# Patient Record
Sex: Female | Born: 1965 | Race: Black or African American | Hispanic: No | Marital: Married | Smoking: Never smoker
Health system: Southern US, Community
[De-identification: ages and names within clinical notes are randomized; demographics above are authoritative.]

## PROBLEM LIST (undated history)

## (undated) DIAGNOSIS — R519 Headache, unspecified: Secondary | ICD-10-CM

## (undated) DIAGNOSIS — E785 Hyperlipidemia, unspecified: Secondary | ICD-10-CM

## (undated) DIAGNOSIS — I471 Supraventricular tachycardia, unspecified: Secondary | ICD-10-CM

## (undated) DIAGNOSIS — R51 Headache: Secondary | ICD-10-CM

## (undated) DIAGNOSIS — E119 Type 2 diabetes mellitus without complications: Secondary | ICD-10-CM

## (undated) DIAGNOSIS — I10 Essential (primary) hypertension: Secondary | ICD-10-CM

## (undated) DIAGNOSIS — D649 Anemia, unspecified: Secondary | ICD-10-CM

## (undated) DIAGNOSIS — K529 Noninfective gastroenteritis and colitis, unspecified: Secondary | ICD-10-CM

## (undated) DIAGNOSIS — K219 Gastro-esophageal reflux disease without esophagitis: Secondary | ICD-10-CM

## (undated) HISTORY — PX: TONSILLECTOMY: SUR1361

## (undated) HISTORY — DX: Essential (primary) hypertension: I10

## (undated) HISTORY — PX: ABDOMINAL HYSTERECTOMY: SHX81

## (undated) HISTORY — DX: Supraventricular tachycardia, unspecified: I47.10

## (undated) HISTORY — DX: Type 2 diabetes mellitus without complications: E11.9

## (undated) HISTORY — DX: Supraventricular tachycardia: I47.1

## (undated) HISTORY — DX: Headache: R51

## (undated) HISTORY — DX: Hyperlipidemia, unspecified: E78.5

## (undated) HISTORY — DX: Gastro-esophageal reflux disease without esophagitis: K21.9

## (undated) HISTORY — PX: TUBAL LIGATION: SHX77

## (undated) HISTORY — DX: Noninfective gastroenteritis and colitis, unspecified: K52.9

## (undated) HISTORY — DX: Headache, unspecified: R51.9

---

## 1999-10-25 ENCOUNTER — Ambulatory Visit (HOSPITAL_COMMUNITY): Admission: RE | Admit: 1999-10-25 | Discharge: 1999-10-25 | Payer: Self-pay | Admitting: Family Medicine

## 1999-10-25 ENCOUNTER — Encounter: Payer: Self-pay | Admitting: Family Medicine

## 2002-02-26 ENCOUNTER — Encounter: Payer: Self-pay | Admitting: Family Medicine

## 2002-02-26 ENCOUNTER — Ambulatory Visit (HOSPITAL_COMMUNITY): Admission: RE | Admit: 2002-02-26 | Discharge: 2002-02-26 | Payer: Self-pay | Admitting: Family Medicine

## 2002-03-26 ENCOUNTER — Encounter: Payer: Self-pay | Admitting: Family Medicine

## 2002-03-26 ENCOUNTER — Ambulatory Visit (HOSPITAL_COMMUNITY): Admission: RE | Admit: 2002-03-26 | Discharge: 2002-03-26 | Payer: Self-pay | Admitting: Family Medicine

## 2002-09-03 ENCOUNTER — Encounter: Payer: Self-pay | Admitting: Family Medicine

## 2002-09-03 ENCOUNTER — Ambulatory Visit (HOSPITAL_COMMUNITY): Admission: RE | Admit: 2002-09-03 | Discharge: 2002-09-03 | Payer: Self-pay | Admitting: Family Medicine

## 2002-11-14 ENCOUNTER — Ambulatory Visit (HOSPITAL_COMMUNITY): Admission: RE | Admit: 2002-11-14 | Discharge: 2002-11-14 | Payer: Self-pay | Admitting: Family Medicine

## 2002-11-14 ENCOUNTER — Encounter: Payer: Self-pay | Admitting: Family Medicine

## 2003-04-01 ENCOUNTER — Ambulatory Visit (HOSPITAL_COMMUNITY): Admission: RE | Admit: 2003-04-01 | Discharge: 2003-04-01 | Payer: Self-pay | Admitting: Internal Medicine

## 2005-08-23 ENCOUNTER — Ambulatory Visit (HOSPITAL_COMMUNITY): Admission: RE | Admit: 2005-08-23 | Discharge: 2005-08-23 | Payer: Self-pay | Admitting: Family Medicine

## 2006-01-05 ENCOUNTER — Emergency Department (HOSPITAL_COMMUNITY): Admission: EM | Admit: 2006-01-05 | Discharge: 2006-01-05 | Payer: Self-pay | Admitting: Emergency Medicine

## 2006-09-03 ENCOUNTER — Ambulatory Visit (HOSPITAL_COMMUNITY): Admission: RE | Admit: 2006-09-03 | Discharge: 2006-09-03 | Payer: Self-pay | Admitting: Family Medicine

## 2007-03-28 ENCOUNTER — Ambulatory Visit (HOSPITAL_COMMUNITY): Admission: RE | Admit: 2007-03-28 | Discharge: 2007-03-28 | Payer: Self-pay | Admitting: Family Medicine

## 2008-02-12 ENCOUNTER — Ambulatory Visit (HOSPITAL_COMMUNITY): Admission: RE | Admit: 2008-02-12 | Discharge: 2008-02-12 | Payer: Self-pay | Admitting: Family Medicine

## 2008-06-25 ENCOUNTER — Encounter (INDEPENDENT_AMBULATORY_CARE_PROVIDER_SITE_OTHER): Payer: Self-pay | Admitting: Obstetrics and Gynecology

## 2008-06-25 ENCOUNTER — Ambulatory Visit (HOSPITAL_COMMUNITY): Admission: RE | Admit: 2008-06-25 | Discharge: 2008-06-25 | Payer: Self-pay | Admitting: Obstetrics and Gynecology

## 2008-07-22 HISTORY — PX: HYSTEROSCOPY W/ ENDOMETRIAL ABLATION: SUR665

## 2008-09-17 ENCOUNTER — Ambulatory Visit (HOSPITAL_COMMUNITY): Admission: RE | Admit: 2008-09-17 | Discharge: 2008-09-17 | Payer: Self-pay | Admitting: Family Medicine

## 2008-09-23 ENCOUNTER — Other Ambulatory Visit: Admission: RE | Admit: 2008-09-23 | Discharge: 2008-09-23 | Payer: Self-pay | Admitting: Otolaryngology

## 2008-11-06 ENCOUNTER — Encounter (INDEPENDENT_AMBULATORY_CARE_PROVIDER_SITE_OTHER): Payer: Self-pay | Admitting: *Deleted

## 2008-12-10 DIAGNOSIS — D649 Anemia, unspecified: Secondary | ICD-10-CM | POA: Insufficient documentation

## 2008-12-10 DIAGNOSIS — I10 Essential (primary) hypertension: Secondary | ICD-10-CM | POA: Insufficient documentation

## 2008-12-10 DIAGNOSIS — R1310 Dysphagia, unspecified: Secondary | ICD-10-CM | POA: Insufficient documentation

## 2008-12-11 ENCOUNTER — Encounter (INDEPENDENT_AMBULATORY_CARE_PROVIDER_SITE_OTHER): Payer: Self-pay | Admitting: *Deleted

## 2008-12-17 ENCOUNTER — Other Ambulatory Visit: Admission: RE | Admit: 2008-12-17 | Discharge: 2008-12-17 | Payer: Self-pay | Admitting: Otolaryngology

## 2008-12-30 ENCOUNTER — Ambulatory Visit (HOSPITAL_COMMUNITY): Admission: RE | Admit: 2008-12-30 | Discharge: 2008-12-30 | Payer: Self-pay | Admitting: Otolaryngology

## 2009-02-24 ENCOUNTER — Ambulatory Visit (HOSPITAL_COMMUNITY): Admission: RE | Admit: 2009-02-24 | Discharge: 2009-02-24 | Payer: Self-pay | Admitting: Family Medicine

## 2009-09-28 ENCOUNTER — Ambulatory Visit (HOSPITAL_COMMUNITY): Admission: RE | Admit: 2009-09-28 | Discharge: 2009-09-28 | Payer: Self-pay | Admitting: Family Medicine

## 2010-01-19 ENCOUNTER — Emergency Department (HOSPITAL_COMMUNITY): Admission: EM | Admit: 2010-01-19 | Discharge: 2010-01-19 | Payer: Self-pay | Admitting: Family Medicine

## 2010-02-25 ENCOUNTER — Ambulatory Visit (HOSPITAL_COMMUNITY): Admission: RE | Admit: 2010-02-25 | Discharge: 2010-02-25 | Payer: Self-pay | Admitting: Family Medicine

## 2010-05-29 ENCOUNTER — Encounter: Payer: Self-pay | Admitting: Family Medicine

## 2010-06-09 NOTE — Letter (Signed)
Summary: Generic Letter, Intro to Referring  [facility]  [address]   [place], [place] [address]   Phone: [phone]  Fax: [phone]      November 06, 2008             RE: Amy Dean   [birth year]                 [address]                 [place], [place]  [address]  Dear Appt Secretary,  This pt has been scheduled an appt for 12/11/2008 @ 11:00 w/ Dr. Jena Gauss.   Lmom with appt info and for a return call.           Sincerely,    Elinor Parkinson  [facility] Ph: [phone]   Fax: [phone]

## 2010-06-09 NOTE — Letter (Signed)
Summary: Generic Letter, Intro to Referring  [facility]  [address]   [place], [place] [address]   Phone: [phone]  Fax: [phone]      December 11, 2008             RE: Amy Dean   03-04-1966                 [address]                 [place], [place]  [address]  Dear Appt Secretary,   This patient was a no show today.          Sincerely,    Elinor Parkinson  [facility] Ph: [phone]   Fax: [phone]

## 2010-07-21 LAB — POCT URINALYSIS DIPSTICK
Bilirubin Urine: NEGATIVE
Glucose, UA: NEGATIVE mg/dL
Ketones, ur: NEGATIVE mg/dL
Nitrite: NEGATIVE
Protein, ur: NEGATIVE mg/dL
Specific Gravity, Urine: 1.02 (ref 1.005–1.030)
Urobilinogen, UA: 0.2 mg/dL (ref 0.0–1.0)
pH: 5.5 (ref 5.0–8.0)

## 2010-08-23 LAB — BASIC METABOLIC PANEL
BUN: 7 mg/dL (ref 6–23)
CO2: 23 mEq/L (ref 19–32)
Calcium: 9.3 mg/dL (ref 8.4–10.5)
Chloride: 103 mEq/L (ref 96–112)
Creatinine, Ser: 0.64 mg/dL (ref 0.4–1.2)
GFR calc Af Amer: >60 mL/min (ref >60)
GFR calc non Af Amer: >60 mL/min (ref >60)
Glucose, Bld: 97 mg/dL (ref 70–99)
Potassium: 3.7 mEq/L (ref 3.5–5.1)
Sodium: 133 mEq/L — ABNORMAL LOW (ref 135–145)

## 2010-08-23 LAB — PREGNANCY, URINE: Preg Test, Ur: NEGATIVE

## 2010-08-23 LAB — CBC
HCT: 29.6 % — ABNORMAL LOW (ref 36.0–46.0)
Hemoglobin: 9.4 g/dL — ABNORMAL LOW (ref 12.0–15.0)
MCHC: 32 g/dL (ref 30.0–36.0)
MCV: 69.3 fL — ABNORMAL LOW (ref 78.0–100.0)
Platelets: 366 10*3/uL (ref 150–400)
RBC: 4.27 MIL/uL (ref 3.87–5.11)
RDW: 21 % — ABNORMAL HIGH (ref 11.5–15.5)
WBC: 3.6 10*3/uL — ABNORMAL LOW (ref 4.0–10.5)

## 2010-08-23 LAB — TYPE AND SCREEN
ABO/RH(D): O POS
Antibody Screen: NEGATIVE

## 2010-08-23 LAB — ABO/RH: ABO/RH(D): O POS

## 2010-08-23 LAB — PROTIME-INR
INR: 1 (ref 0.00–1.49)
Prothrombin Time: 13.4 seconds (ref 11.6–15.2)

## 2010-08-23 LAB — APTT: aPTT: 29 seconds (ref 24–37)

## 2010-09-20 NOTE — H&P (Signed)
NAMEJENNAE, HAKEEM         ACCOUNT NO.:  [id number]   MEDICAL RECORD NO.:  [id number]          PATIENT TYPE:  AMB   LOCATION:  SDC                           FACILITY:  [facility]   PHYSICIAN:  Kendra H. Tenny Craw, MD     DATE OF BIRTH:  1965-08-08   DATE OF ADMISSION:  06/25/2008  DATE OF DISCHARGE:                              HISTORY & PHYSICAL   CHIEF COMPLAINT:  Menorrhagia.   HISTORY OF PRESENT ILLNESS:  Ms. Poulter is a 45 year old gravida  2, para 2 African American female who presents for evaluation of  menorrhagia.  She has a 2-year history of heavy menstrual bleeding.  An  ultrasound performed by her primary care physician demonstrated an  enlarged uterus, measuring 19 x 7 x 13 cm with two large anterior  intramural fibroids, the largest measuring 7 x 6 x 6 cm.  She was also  noted to have a thickened endometrial lining with a 1.5 cm hypoechoic  focus within the endometrial cavity, possibly representing either a  submucosal fibroid versus an intrauterine polyp.  She presented to me  for further evaluation.  A sonohysterogram was performed, which  demonstrated a mass-like structure protruding into the endometrial  cavity, measuring 2.6 x 1.4 x 1.9 cm, consistent with either a  submucosal fibroid or an endometrial polyp.  At the time of evaluation,  the patient's hemoglobin was noted to be 8.7.  She was offered medical  versus surgical management, and the patient desired conservative  surgical management and presents today for hysteroscopy, D and C,  possible resection of endometrial polyp versus submucosal fibroid and  possible hydrotherm endometrial ablation.  Patient does understand that  given the size of the uterus, that the endometrial ablation may not be  successful, and she may require definitive surgical management with  hysterectomy.   PAST MEDICAL HISTORY:  1. Hypertension.  2. Anemia.   PAST SURGICAL HISTORY:  1. Full-term cesarean section for cephalopelvic  disproportion for an      infant weighing 7 pounds, 14 ounces.  2. In 1989, repeat cesarean section and bilateral tubal ligation.  3. Tonsillectomy and adenoidectomy.   MEDICATIONS:  1. Lisinopril/hydrochlorothiazide.  2. Iron sulfate.   FAMILY HISTORY:  Noncontributory.   SOCIAL HISTORY:  Negative x3.   PHYSICAL EXAMINATION:  Weight 197 pounds.  Blood pressure 130/70.  Height 5 feet 4 inches.  Alert and oriented x3.  No apparent distress.  ABDOMEN:  Soft and nontender.  Uterus is palpable at the umbilicus.  No hernias or hepatosplenomegaly  are noted.  Normal external female genitalia.  The vagina is rugated.  Uterus is enlarged approximately 20 weeks size and mobile.  The cervix  is nontender.  Adnexa nonpalpable.   ASSESSMENT/PLAN:  This is a 45 year old G2, P2 with anemia and  menorrhagia, who presents for hysteroscopy, dilatation and curettage,  endometrial polypectomy versus resection of submucosal fibroid and  possible hydrotherm endometrial ablation.      Freddrick March. Tenny Craw, MD  Electronically Signed     KHR/MEDQ  D:  06/24/2008  T:  06/25/2008  Job:  [id number]

## 2010-09-20 NOTE — Op Note (Signed)
Amy Dean, Amy Dean         ACCOUNT NO.:  [id number]   MEDICAL RECORD NO.:  [id number]          PATIENT TYPE:  AMB   LOCATION:  SDC                           [facility]   PHYSICIAN:  Kendra H. Tenny Craw, MD     DATE OF BIRTH:  03-19-1966   DATE OF PROCEDURE:  06/25/2008  DATE OF DISCHARGE:                               OPERATIVE REPORT   PREOPERATIVE DIAGNOSES:  1. Menorrhagia.  2. Anemia.  3. Uterine fibroids.  4. Endometrial mass.   POSTOPERATIVE DIAGNOSES:  1. Menorrhagia.  2. Anemia.  3. Fibroid uterus.  4. No evidence of endometrial mass.   PROCEDURES:  Hysteroscopy, dilation and curettage, HydroTherm  endometrial ablation.   SURGEON:  Freddrick March. Tenny Craw, MD   ASSISTANT:  None.   ANESTHESIA:  General.   OPERATIVE FINDINGS:  Enlarged and irregularly-shaped uterus  approximately 20 weeks' size, tortuous endometrial cavity enlarged in  size, sounding to 19 cm.  Normal-appearing endometrial surface.  No  evidence of endometrial masses or submucosal fibroids.  Intrauterine  blood clots were noted.   SPECIMEN:  Post-ablation endometrial curettings.   DISPOSITION OF SPECIMENS:  To Pathology.   ESTIMATED BLOOD LOSS:  Minimal.   COMPLICATIONS:  None.   PROCEDURE:  Ms. Robak is a 45 year old G2, P2 African American  female with a 2-year history of menorrhagia and anemia.  She was  diagnosed with uterine fibroids and a thickened endometrial lining.  She  underwent a Sonohystogram, which demonstrated a 2.9 x 1.9 x 1.4 cm  intracavitary mass.  In December 2009, she was placed on monthly  progestin therapy with Provera and presents today for hysteroscopy, D&C,  and endometrial ablative procedure.  Preoperative hemoglobin was 9.4,  which was improved from 8.7 from her previous sampling.  Following the  appropriate informed consent, the patient was brought to the operating  room where she was placed in the lithotomy position in [place] stirrups.  A general  anesthesia was administered.  She was prepped and draped in  the normal sterile fashion.  A speculum was placed into the vagina.  A  single-toothed tenaculum was placed on the anterior lip of the cervix.  A paracervical block was administered with 10 mL of 1% lidocaine.  The  uterus was then sounded to 19 cm.  The HydroTherm hysteroscopy scope was  then introduced transcervically.  The intrauterine cavity was noted to  be tortuous in shape with significant depth; however, the surface of the  endometrium itself was normal in appearance.  Given the tortuous nature  of the cavity, the tubal ostia bilaterally could not be adequately  visualized.  However, given ease of entry, it was felt that we were  within the endometrial cavity.  No submucosal fibroids or endometrial  polyps were noted.  Some blood clots were noted within the endometrial  cavity that were removed.  Given the depth of the cavity and the benign-  appearing surface of the endometrium, the decision was made to proceed  with the endometrial ablative procedure prior to endometrial sampling.  The HydroTherm device was initiated with a volume of 80 mL of fluid.  The cavity was then ablated, and under direct visualization, the  ablation was visualized.  At the completion of the cool down portion of  the ablative cycle, the hysteroscope was removed.  A sharp curettage was  then performed with removal of ablated endometrium and this will be sent  for pathology.  The patient tolerated the procedure well.  The single-  tooth tenaculum was removed from the anterior lip of the cervix.  The  speculum was removed from the vagina.  Anesthesia was reversed and the  patient was brought to the recovery room in stable condition following  the procedure.      Freddrick March. Tenny Craw, MD  Electronically Signed     KHR/MEDQ  D:  06/25/2008  T:  06/26/2008  Job:  [id number]

## 2010-09-20 NOTE — H&P (Signed)
NAMEEXA, BOMBA         ACCOUNT NO.:  [id number]   MEDICAL RECORD NO.:  [id number]          PATIENT TYPE:  AMB   LOCATION:  SDC                           [facility]   PHYSICIAN:  Kendra H. Tenny Craw, MD     DATE OF BIRTH:  08-Jun-1965   DATE OF ADMISSION:  DATE OF DISCHARGE:                              HISTORY & PHYSICAL   ATTENDING PHYSICIAN:  Kendra H. Tenny Craw, MD.   CHIEF COMPLAINT:  Menorrhagia.   HISTORY OF PRESENT ILLNESS:  Ms. Cropley is a 45 year old gravida  2, para 2, African American female who presents with a 2-year history of  heavy vaginal bleeding.  She was evaluated by her primary care physician  and noted to have an enlarged uterus.  A pelvic ultrasound was  performed, which demonstrated an enlarged uterus measuring approximately  19 x 7 x 13 cm with 2 anterior intramural fibroids noted, the first  measuring 7 x 6 x 6 cm, the second measuring 5 x 6 x 6 cm.  Additionally, the endometrium was noted to be thickened at this time  with a 1.5 cm hypoechoic focus representing either an endometrial polyp  versus a submucosal fibroid.  Her hemoglobin at this time was noted to  be 8.7.  She was sent to me for further evaluation.  She underwent a  sonohystogram on April 13, 2008, which demonstrated a benign cyst,  which demonstrated a mass-like structure emanating from the wall of the  endometrial canal.  Endometrial cavity measuring 2.6 x 1.4 x 1.9 cm,  which was consistent with either a intrauterine polyp versus a  submucosal fibroid.  The patient desired conservative management.  She  was placed on Provera 10 mg daily for 10 days out of the month and  scheduled for hysteroscopy D&C, possible resection of either endometrial  polyp or submucosal fibroid and possible hydrotherm endometrial  ablation.  Given the size of the uterus, the patient does understand  that an endometrial ablation may not be successful and she may require  definitive surgical management  with hysterectomy in the future.    DICTATION ENDED AT THIS POINT.      Freddrick March. Tenny Craw, MD  Electronically Signed     KHR/MEDQ  D:  06/24/2008  T:  06/25/2008  Job:  [id number]

## 2010-09-23 NOTE — Op Note (Signed)
NAMELEXXI, KOSLOW                     ACCOUNT NO.:  [id number]   MEDICAL RECORD NO.:  [id number]                   PATIENT TYPE:  AMB   LOCATION:  DAY                                  [facility]ACILITY:  [facility]   PHYSICIAN:  R. Roetta Sessions, M.D.              DATE OF BIRTH:  03/23/1966   DATE OF PROCEDURE:  04/01/2003  DATE OF DISCHARGE:                                 OPERATIVE REPORT   PROCEDURE:  Esophagogastroduodenoscopy with Elease Hashimoto dilation.   INDICATIONS FOR PROCEDURE:  The patient is a 45 year old lady with long  standing gastroesophageal reflux disease, also chronic esophageal dysphagia.  She comes for EGD with potential esophageal dilation as appropriate. This  approach has been discussed with the patient at length. The potential risks,  benefits, and alternatives have been reviewed. Reflux symptoms are well  controlled on the omeprazole 40 mg orally daily. This approach of EGD with  possible esophageal dilation was discussed with the patient at length. The  potential risks, benefits, and alternatives have been reviewed. Please see  my March 27, 2003 consultation for more information.   MONITORING:  O2 saturation, blood pressure, pulse, and respirations were  monitored throughout the entire procedure.   CONSCIOUS SEDATION:  Versed 4 mg IV, Demerol 100 mg IV in divided doses.   INSTRUMENTS:  Olympus video chip adult gastroscope.   FINDINGS:  Examination of the tubular esophagus revealed a noncritical  appearing Schatzki's ring. The esophageal mucosa appeared normal with no  evidence of Barrett's esophagus or esophagitis. The EG junction was easily  traversed.   STOMACH:  The gastric cavity was empty and insufflated well with air. A  thorough examination of the gastric mucosa including a retroflexed view of  the proximal stomach and esophagogastric junction demonstrated only a small  hiatal hernia. The pylorus was patent and easily traversed.   DUODENUM:  The bulb  and second portion appeared normal.   THERAPEUTIC/DIAGNOSTIC MANEUVERS:  A 56 French Maloney dilator was passed to  full insertion with good patient tolerance and without blood return on the  dilator. A look back revealed no  apparent complications related to passage  of the dilator. The patient tolerated the procedure well and was reacted in  endoscopy.   IMPRESSION:  1. Noncritical appearing Schatzki's ring otherwise normal tubular esophagus     status post dilation as described above.  2. Small hiatal hernia otherwise normal stomach, normal D1 and D2.   RECOMMENDATIONS:  1. Continue omeprazole 40 mg orally daily, antireflux measures.  2. Followup appointment within one month to make sure she is doing well. If     she were to have continued symptoms of dysphagia, she would need further     evaluation via barium pill esophagram.      ___________________________________________  Jonathon Bellows, M.D.   RMR/MEDQ  D:  04/01/2003  T:  04/01/2003  Job:  [id number]   cc:   Dr. Gerda Diss

## 2010-09-23 NOTE — Consult Note (Signed)
[name], [name]                     ACCOUNT NO.:  [id number]   MEDICAL RECORD NO.:  [id number]                   PATIENT TYPE:  OUT   LOCATION:  RAD                                  FACILITY:  [facility]   PHYSICIAN:  Lionel Dean, M.D.                 DATE OF BIRTH:  1966-01-20   DATE OF CONSULTATION:  03/27/2003  DATE OF DISCHARGE:                                   CONSULTATION   REASON FOR CONSULTATION:  Dysphagia.   HISTORY OF PRESENT ILLNESS:  Amy Dean is a 45 year old black female who  presents today at the request of Dr. Lilyan Punt for further evaluation of  dysphagia.  We have seen Jasalyn in the past for dysphagia and reflux  symptoms in August 2001.  She was scheduled for endoscopy at that time but  had to cancer.  She presents today stating that she had done fairly well up  until about a month or two ago when she started noticing worsening dysphagia  symptoms.  When she swallows solid foods and sometimes her medications, it  gets lodged in her mid esophageal region.  Sometimes she is able to wash it  down with liquids, but other times, the food comes back up.  She has chronic  heartburn which is well controlled on omeprazole.  She has been on this for  1-2 years.  Denies any abdominal pain, constipation, diarrhea, melena or  rectal bleeding.   CURRENT MEDICATIONS:  1. Diovan 160 mg daily.  2. Omeprazole 40 mg daily.   ALLERGIES:  No known drug allergies.   PAST MEDICAL HISTORY:  1. Hypertension.  2. Status post cesarean section times two.   FAMILY HISTORY:  Negative or chronic GI illnesses or colorectal cancer.   SOCIAL HISTORY:  She has been married for 14 years.  She had two sons, but  one was killed in a motor vehicle accident a couple of years ago at age 74.  She is employed at [employer] in [place].  She  has never been a smoker.  She consumes alcohol, some over the holidays.   PHYSICAL EXAMINATION:  VITAL SIGNS:  Weight  206, height 5 feet 4 inches,  temperature 97.9, blood pressure 140/92, pulse 70.  GENERAL APPEARANCE:  Pleasant, well-developed, well-nourished black female  in no acute distress.  SKIN:  Warm and dry, no jaundice.  HEENT:  Conjunctivae are pink.  Sclerae nonicteric.  Oropharyngeal  moist  and pink.  No lesions, erythema or exudate.  No lymphadenopathy or  thyromegaly.  CHEST:  Clear to auscultation.  CARDIAC:  Regular rate and rhythm.  Normal S1, S2.  No murmurs, rubs or  gallops.  ABDOMEN:  Positive bowel sounds, slightly obese but symmetrical, soft,  nontender.  No organomegaly or masses.  EXTREMITIES:  No edema.   IMPRESSION:  Amy Dean is a pleasant 45 year old lady with progressively  worsening esophageal dysphagia.  She may have developed esophageal  webbing  or stricture.  She also has chronic gastroesophageal reflux disease with  symptoms well controlled on omeprazole.   PLAN:  1. EGD with dilatation in the near future.  2. Continue omeprazole 40 mg daily.   I would like to thank Dr. Lilyan Punt for allowing [place] to take part in the  care of this patient.   As the patient has established care with Dr. Jena Gauss, he will be performing  upper endoscopy.     ________________________________________  ___________________________________________  Tana Coast, P.A.                         Lionel Dean, M.D.   LL/MEDQ  D:  03/27/2003  T:  03/27/2003  Job:  [id number]   cc:   R. Roetta Sessions, M.D.  [address]  [place]  [place] [address]  Fax: [phone]

## 2011-02-08 ENCOUNTER — Other Ambulatory Visit: Payer: Self-pay | Admitting: Family Medicine

## 2011-02-08 DIAGNOSIS — Z139 Encounter for screening, unspecified: Secondary | ICD-10-CM

## 2011-02-27 ENCOUNTER — Ambulatory Visit (HOSPITAL_COMMUNITY): Payer: 59

## 2011-02-28 ENCOUNTER — Ambulatory Visit (HOSPITAL_COMMUNITY)
Admission: RE | Admit: 2011-02-28 | Discharge: 2011-02-28 | Disposition: A | Discharge status: Home / Self Care | Payer: 59 | Source: Ambulatory Visit | Attending: Family Medicine | Admitting: Family Medicine

## 2011-02-28 DIAGNOSIS — Z139 Encounter for screening, unspecified: Secondary | ICD-10-CM

## 2011-02-28 DIAGNOSIS — Z1231 Encounter for screening mammogram for malignant neoplasm of breast: Secondary | ICD-10-CM | POA: Insufficient documentation

## 2011-03-06 ENCOUNTER — Emergency Department (HOSPITAL_COMMUNITY): Payer: 59

## 2011-03-06 ENCOUNTER — Encounter: Payer: Self-pay | Admitting: *Deleted

## 2011-03-06 ENCOUNTER — Emergency Department (HOSPITAL_COMMUNITY)
Admission: EM | Admit: 2011-03-06 | Discharge: 2011-03-06 | Disposition: A | Discharge status: Home / Self Care | Payer: 59 | Attending: Emergency Medicine | Admitting: Emergency Medicine

## 2011-03-06 DIAGNOSIS — R109 Unspecified abdominal pain: Secondary | ICD-10-CM | POA: Insufficient documentation

## 2011-03-06 DIAGNOSIS — R102 Pelvic and perineal pain: Secondary | ICD-10-CM

## 2011-03-06 DIAGNOSIS — E119 Type 2 diabetes mellitus without complications: Secondary | ICD-10-CM | POA: Insufficient documentation

## 2011-03-06 DIAGNOSIS — I10 Essential (primary) hypertension: Secondary | ICD-10-CM | POA: Insufficient documentation

## 2011-03-06 DIAGNOSIS — N949 Unspecified condition associated with female genital organs and menstrual cycle: Secondary | ICD-10-CM | POA: Insufficient documentation

## 2011-03-06 DIAGNOSIS — D259 Leiomyoma of uterus, unspecified: Secondary | ICD-10-CM | POA: Insufficient documentation

## 2011-03-06 LAB — URINALYSIS, ROUTINE W REFLEX MICROSCOPIC
Bilirubin Urine: NEGATIVE
Glucose, UA: NEGATIVE mg/dL
Hgb urine dipstick: NEGATIVE
Ketones, ur: NEGATIVE mg/dL
Leukocytes, UA: NEGATIVE
Nitrite: NEGATIVE
Protein, ur: NEGATIVE mg/dL
Specific Gravity, Urine: 1.02 (ref 1.005–1.030)
Urobilinogen, UA: 0.2 mg/dL (ref 0.0–1.0)
pH: 6 (ref 5.0–8.0)

## 2011-03-06 LAB — BASIC METABOLIC PANEL
BUN: 12 mg/dL (ref 6–23)
CO2: 23 mEq/L (ref 19–32)
Calcium: 9.3 mg/dL (ref 8.4–10.5)
Chloride: 102 mEq/L (ref 96–112)
Creatinine, Ser: 0.63 mg/dL (ref 0.50–1.10)
GFR calc Af Amer: >90 mL/min (ref >90)
GFR calc non Af Amer: >90 mL/min (ref >90)
Glucose, Bld: 147 mg/dL — ABNORMAL HIGH (ref 70–99)
Potassium: 3.6 mEq/L (ref 3.5–5.1)
Sodium: 135 mEq/L (ref 135–145)

## 2011-03-06 LAB — CBC
HCT: 32.7 % — ABNORMAL LOW (ref 36.0–46.0)
Hemoglobin: 10.8 g/dL — ABNORMAL LOW (ref 12.0–15.0)
MCH: 25.3 pg — ABNORMAL LOW (ref 26.0–34.0)
MCHC: 33 g/dL (ref 30.0–36.0)
MCV: 76.6 fL — ABNORMAL LOW (ref 78.0–100.0)
Platelets: 216 10*3/uL (ref 150–400)
RBC: 4.27 MIL/uL (ref 3.87–5.11)
RDW: 14.9 % (ref 11.5–15.5)
WBC: 3.7 10*3/uL — ABNORMAL LOW (ref 4.0–10.5)

## 2011-03-06 LAB — DIFFERENTIAL
Basophils Absolute: 0 10*3/uL (ref 0.0–0.1)
Basophils Relative: 1 % (ref 0–1)
Eosinophils Absolute: 0.1 10*3/uL (ref 0.0–0.7)
Eosinophils Relative: 3 % (ref 0–5)
Lymphocytes Relative: 34 % (ref 12–46)
Lymphs Abs: 1.3 10*3/uL (ref 0.7–4.0)
Monocytes Absolute: 0.4 10*3/uL (ref 0.1–1.0)
Monocytes Relative: 11 % (ref 3–12)
Neutro Abs: 1.9 10*3/uL (ref 1.7–7.7)
Neutrophils Relative %: 52 % (ref 43–77)

## 2011-03-06 LAB — PREGNANCY, URINE: Preg Test, Ur: NEGATIVE

## 2011-03-06 MED ORDER — HYDROCODONE-ACETAMINOPHEN 5-500 MG PO TABS: 1.0000 | ORAL_TABLET | Freq: Four times a day (QID) | ORAL | Status: AC | PRN

## 2011-03-06 NOTE — ED Notes (Signed)
Pt reports left lower abdominal pain, radiating into back.  Reports she's had pain since Friday, but pain has increased in severity tonight.

## 2011-03-06 NOTE — ED Provider Notes (Signed)
History     CSN: [id number] Arrival date & time: 03/06/2011  4:21 AM   First MD Initiated Contact with Patient 03/06/11 0423      Chief Complaint  Patient presents with  . Abdominal Pain    (Consider location/radiation/quality/duration/timing/severity/associated sxs/prior treatment) Patient is a 45 y.o. female presenting with abdominal pain. The history is provided by the patient.  Abdominal Pain The primary symptoms of the illness include abdominal pain. The primary symptoms of the illness do not include fever, nausea, vomiting, diarrhea, dysuria or vaginal bleeding. The current episode started 2 days ago. The onset of the illness was gradual. The problem has been gradually worsening.  The patient states that she believes she is currently not pregnant. The patient has not had a change in bowel habit. Symptoms associated with the illness do not include chills, diaphoresis, constipation, urgency, hematuria, frequency or back pain.    Past Medical History  Diagnosis Date  . Hypertension   . Diabetes mellitus     History reviewed. No pertinent past surgical history.  History reviewed. No pertinent family history.  History  Substance Use Topics  . Smoking status: Never Smoker   . Smokeless tobacco: Not on file  . Alcohol Use: No    OB History    Grav Para Term Preterm Abortions TAB SAB Ect Mult Living                  Review of Systems  Constitutional: Negative for fever, chills and diaphoresis.  Gastrointestinal: Positive for abdominal pain. Negative for nausea, vomiting, diarrhea and constipation.  Genitourinary: Negative for dysuria, urgency, frequency, hematuria and vaginal bleeding.  Musculoskeletal: Negative for back pain.  All other systems reviewed and are negative.    Allergies  Review of patient's allergies indicates no known allergies.  Home Medications   Current Outpatient Rx  Name Route Sig Dispense Refill  . LISINOPRIL-HYDROCHLOROTHIAZIDE 20-12.5  MG PO TABS Oral Take 1 tablet by mouth daily.      Marland Kitchen METFORMIN HCL 500 MG PO TABS Oral Take 500 mg by mouth 2 (two) times daily with a meal.        BP 157/93  Pulse 100  Temp(Src) 98.5 F (36.9 C) (Oral)  Resp 18  Ht 5\' 4"  (1.626 m)  Wt 196 lb (88.905 kg)  BMI 33.64 kg/m2  SpO2 99%  LMP 02/10/2011  Physical Exam  Nursing note and vitals reviewed. Constitutional: She is oriented to person, place, and time. She appears well-developed and well-nourished. No distress.  HENT:  Head: Normocephalic and atraumatic.  Neck: Normal range of motion. Neck supple.  Cardiovascular: Normal rate and regular rhythm.  Exam reveals no gallop and no friction rub.   No murmur heard. Pulmonary/Chest: Effort normal and breath sounds normal. No respiratory distress.  Abdominal: Soft. She exhibits no distension.       There is moderate ttp in the llq.  There is no guarding or rebound.    Musculoskeletal: Normal range of motion.  Neurological: She is alert and oriented to person, place, and time.  Skin: Skin is warm and dry. She is not diaphoretic.    ED Course  Procedures (including critical care time)   Labs Reviewed  CBC  DIFFERENTIAL  BASIC METABOLIC PANEL  URINALYSIS, ROUTINE W REFLEX MICROSCOPIC  PREGNANCY, URINE   No results found.   No diagnosis found.    MDM  Nothing acute on labs, CT.  CT does show enlarged uterus with fibroids.  Will treat with  pain meds, arrange follow up with Gyn.          Geoffery Lyons, MD 03/06/11 [phone]

## 2011-03-10 ENCOUNTER — Encounter (HOSPITAL_COMMUNITY): Payer: Self-pay | Admitting: Pharmacy Technician

## 2011-03-15 ENCOUNTER — Other Ambulatory Visit: Payer: Self-pay

## 2011-03-15 ENCOUNTER — Other Ambulatory Visit: Payer: Self-pay | Admitting: Obstetrics and Gynecology

## 2011-03-15 ENCOUNTER — Encounter (HOSPITAL_COMMUNITY)
Admission: RE | Admit: 2011-03-15 | Discharge: 2011-03-15 | Disposition: A | Discharge status: Home / Self Care | Payer: 59 | Source: Ambulatory Visit | Attending: Obstetrics and Gynecology | Admitting: Obstetrics and Gynecology

## 2011-03-15 ENCOUNTER — Encounter (HOSPITAL_COMMUNITY): Payer: Self-pay | Admitting: Obstetrics and Gynecology

## 2011-03-15 ENCOUNTER — Encounter (HOSPITAL_COMMUNITY): Payer: Self-pay

## 2011-03-15 LAB — URINALYSIS, ROUTINE W REFLEX MICROSCOPIC
Bilirubin Urine: NEGATIVE
Glucose, UA: NEGATIVE mg/dL
Ketones, ur: NEGATIVE mg/dL
Leukocytes, UA: NEGATIVE
Nitrite: NEGATIVE
Protein, ur: NEGATIVE mg/dL
Specific Gravity, Urine: 1.02 (ref 1.005–1.030)
Urobilinogen, UA: 0.2 mg/dL (ref 0.0–1.0)
pH: 5.5 (ref 5.0–8.0)

## 2011-03-15 LAB — COMPREHENSIVE METABOLIC PANEL
ALT: 16 U/L (ref 0–35)
AST: 20 U/L (ref 0–37)
Albumin: 4 g/dL (ref 3.5–5.2)
Alkaline Phosphatase: 68 U/L (ref 39–117)
BUN: 8 mg/dL (ref 6–23)
CO2: 26 mEq/L (ref 19–32)
Calcium: 10 mg/dL (ref 8.4–10.5)
Chloride: 100 mEq/L (ref 96–112)
Creatinine, Ser: 0.62 mg/dL (ref 0.50–1.10)
GFR calc Af Amer: >90 mL/min (ref >90)
GFR calc non Af Amer: >90 mL/min (ref >90)
Glucose, Bld: 147 mg/dL — ABNORMAL HIGH (ref 70–99)
Potassium: 3.7 mEq/L (ref 3.5–5.1)
Sodium: 135 mEq/L (ref 135–145)
Total Bilirubin: 0.2 mg/dL — ABNORMAL LOW (ref 0.3–1.2)
Total Protein: 7.7 g/dL (ref 6.0–8.3)

## 2011-03-15 LAB — SURGICAL PCR SCREEN
MRSA, PCR: NEGATIVE
Staphylococcus aureus: NEGATIVE

## 2011-03-15 LAB — URINE MICROSCOPIC-ADD ON

## 2011-03-15 LAB — CBC
HCT: 34.3 % — ABNORMAL LOW (ref 36.0–46.0)
Hemoglobin: 11.1 g/dL — ABNORMAL LOW (ref 12.0–15.0)
MCH: 24.8 pg — ABNORMAL LOW (ref 26.0–34.0)
MCHC: 32.4 g/dL (ref 30.0–36.0)
MCV: 76.6 fL — ABNORMAL LOW (ref 78.0–100.0)
Platelets: 308 10*3/uL (ref 150–400)
RBC: 4.48 MIL/uL (ref 3.87–5.11)
RDW: 14.7 % (ref 11.5–15.5)
WBC: 3.9 10*3/uL — ABNORMAL LOW (ref 4.0–10.5)

## 2011-03-15 LAB — HCG, SERUM, QUALITATIVE: Preg, Serum: NEGATIVE

## 2011-03-15 MED ORDER — PEG 3350-KCL-NABCB-NACL-NASULF 236 G PO SOLR: 2000.0000 mL | Freq: Once | ORAL | Status: DC

## 2011-03-15 NOTE — Patient Instructions (Addendum)
Hysterectomy A hysterectomy is a procedure where your womb (uterus) is surgically taken out. It will no longer be possible to have menstrual periods or to become pregnant. Removal of the tubes and ovaries (bilateral salpingo-oopherectomy) can be done during this operation as well.   An abdominal hysterectomy is done through a large cut (incision) in the abdomen made by the surgeon.   A vaginal hysterectomy is done through the vagina. There are no abdominal incisions, but there will be incisions on the inside of the vagina.   A laparoscopic assisted vaginal hysterectomy is done through 2 or 3 small incisions in the abdomen, but the uterus is removed and passed through the vagina.  Women who are going to have a hysterectomy should be tested first to make sure there is no cancer of the cervix or in the uterus. INDICATIONS FOR HYSTERECTOMY:  Persistent abnormal bleeding.   Lasting (chronic) pelvic pain.   Endometriosis. This is when the lining of the uterus (endometrium) is misplaced outside of its normal location.   Adenomyosis. This is when the endometrium tissue grows in the muscle of the uterus.   Uterine prolapse. This is when the uterus falls down into the vagina.   Cancer of the uterus or cervix that requires a radical hysterectomy, removal of the uterus, tubes, ovaries, and surrounding lymph nodes.  LET YOUR CAREGIVER KNOW ABOUT:  Allergies (especially to medicines).   Medications taken including herbs, eye drops, over the counter medications, and creams.   Use of steroids (by mouth or creams).   Past problems with anesthetics or numbing medication.   Possibility of pregnancy, if this applies.   History of blood clots (thrombophlebitis).   History of bleeding or blood problems.   Past surgery.   Other health problems.  RISKS AND COMPLICATIONS All surgeries can have risks. Some of these risks are:  A lot of bleeding.   Injury to surrounding organs.   Infection.    Blood clots of the leg, heart, or lung.   Problems with anesthesia.   Early menopause.  BEFORE THE PROCEDURE  Do not take aspirin or blood thinners for a week before surgery, or as directed by your caregiver.   Do not eat or drink anything after midnight the night before surgery, or as directed by your caregiver.   Let your caregiver know if you get a cold or other infectious problems before surgery.   If you are being admitted the day of surgery, you should be present 60 minutes before your procedure or as told by your caregiver.  PROCEDURE   An IV (intravenous) will be placed in your arm. You will be given a drug to make you sleep (anesthetic) during surgery. You may be given a shot in the spine (spinal anesthesia) that will numb your body from the waist down. This will keep you pain-free during surgery.   When you wake from surgery, you will have the IV and a long, narrow, hollow tube (urinary catheter) draining the bladder for 1 or 2 days after surgery. This will make passing your urine easier. It also helps by keeping your bladder empty during surgery.   After surgery, you will be taken to the recovery area where a nurse will watch and check your progress. Once you wake up, stable and taking fluids well, without other problems, you will be allowed to return to your room. Usually you will remain in the hospital 3 to 5 days. You may be given an antibiotic during and after   the surgery and when you go home. Pain medication will be ordered by your caregiver while you are in the hospital and when you go home.  HOME CARE INSTRUCTIONS  Healing will take time. You will have discomfort, tenderness, swelling, and bruising at the operative site for a couple of weeks. This is normal and will get better as time goes on. 20 Amy Dean  03/15/2011   Your procedure is scheduled on:  03/21/2011  Report to [facility] at  615  AM.  Call this number if you have problems the morning of  surgery: [phone]   Remember:   Do not eat food:After Midnight.  Do not drink clear liquids: After Midnight.  Take these medicines the morning of surgery with A SIP OF WATER:  vicodin,lisinopril   Do not wear jewelry, make-up or nail polish.  Do not wear lotions, powders, or perfumes. You may wear deodorant.  Do not shave 48 hours prior to surgery.  Do not bring valuables to the hospital.  Contacts, dentures or bridgework may not be worn into surgery.  Leave suitcase in the car. After surgery it may be brought to your room.  For patients admitted to the hospital, checkout time is 11:00 AM the day of discharge.   Patients discharged the day of surgery will not be allowed to drive home.  Name and phone number of your driver: family  Special Instructions: CHG Shower Use Special Wash: 1/2 bottle night before surgery and 1/2 bottle morning of surgery.   Please read over the following fact sheets that you were given: Pain Booklet, MRSA Information, Surgical Site Infection Prevention, Anesthesia Post-op Instructions and Care and Recovery After Surgery   Only take over-the-counter or prescription medicines for pain, discomfort, or fever as directed by your caregiver.   Do not take aspirin. It can cause bleeding.   Do not drive when taking pain medication.   Follow your caregiver's advice regarding diet, exercise, lifting, driving, and general activities.   Resume your usual diet as directed and allowed.   Get plenty of rest and sleep.   Do not douche, use tampons, or have sexual intercourse until your caregiver gives you permission.   Change your bandages (dressings) as directed.   Take your temperature twice a day. Write it down.   Your caregiver may recommend showers instead of baths for a few weeks.   Do not drink alcohol until your caregiver gives you permission.   If you develop constipation, you may take a mild laxative with your caregiver's permission. Bran foods and  drinking fluids helps with constipation problems.   Try to have someone home with you for a week or two to help with the household activities.   Make sure you and your family understands everything about your operation and recovery.   Do not sign any legal documents until you feel normal again.   Keep all your follow-up appointments as recommended by your caregiver.  SEEK MEDICAL CARE IF:   There is swelling, redness, or increasing pain in the wound area.   Pus is coming from the wound.   You notice a bad smell from the wound or surgical dressing.   You have pain, redness, and swelling from the intravenous site.   The wound is breaking open (the edges are not staying together).   You feel dizzy or feel like fainting.   You develop pain or bleeding when you urinate.   You develop diarrhea.   You develop nausea and  vomiting.   You develop abnormal vaginal discharge.   You develop a rash.   You have any type of abnormal reaction or develop an allergy to your medication.   You need stronger pain medication for your pain.  SEEK IMMEDIATE MEDICAL CARE IF:  You have a fever.   You develop abdominal pain.   You develop chest pain.   You develop shortness of breath.   You pass out.   You develop pain, swelling, or redness of your leg.   You develop heavy vaginal bleeding with or without blood clots.  Document Released: 10/18/2000 Document Revised: 01/04/2011 Document Reviewed: 09/05/2007 [facility] Patient Information 2012 [place], [place].PATIENT INSTRUCTIONS POST-ANESTHESIA  IMMEDIATELY FOLLOWING SURGERY:  Do not drive or operate machinery for the first twenty four hours after surgery.  Do not make any important decisions for twenty four hours after surgery or while taking narcotic pain medications or sedatives.  If you develop intractable nausea and vomiting or a severe headache please notify your doctor immediately.  FOLLOW-UP:  Please make an appointment with your  surgeon as instructed. You do not need to follow up with anesthesia unless specifically instructed to do so.  WOUND CARE INSTRUCTIONS (if applicable):  Keep a dry clean dressing on the anesthesia/puncture wound site if there is drainage.  Once the wound has quit draining you may leave it open to air.  Generally you should leave the bandage intact for twenty four hours unless there is drainage.  If the epidural site drains for more than 36-48 hours please call the anesthesia department.  QUESTIONS?:  Please feel free to call your physician or the hospital operator if you have any questions, and they will be happy to assist you.     [facility] Anesthesia Department [address] [place] [phone]

## 2011-03-20 NOTE — H&P (Addendum)
Amy Dean is an 45 y.o. female. She is being admitted for abdominal hysterectomy for symptomatic uterine fibroids, after an attempted endometrial ablation done 2010 was unsuccessful at controlling menses. Endometrial pathology then was benign secretory endometrium.   Pertinent Gynecological History: Menses: regular every month without intermenstrual spotting and usually lasting 7 to 8 days Bleeding:  Contraception: tubal ligation DES exposure: unknown Blood transfusions: none Sexually transmitted diseases: no past history Previous GYN Procedures: endometrial ablation 06/23/2008  Last mammogram: normal Date: last year Last pap: normal Date: 2011 OB History: G3, P2-0-1-2   Menstrual History: Menarche age:  Patient's last menstrual period was 02/10/2011.    Past Medical History  Diagnosis Date  . Hypertension   . Diabetes mellitus     Past Surgical History  Procedure Date  . Cesarean section 1982 and 1989    x2  . Tonsillectomy   . Hysteroscopy w/ endometrial ablation 07/22/2008    K.Tenny Craw MD [facility]  . Tubal ligation     Family History  Problem Relation Age of Onset  . Anesthesia problems Neg Hx   . Hypotension Neg Hx   . Malignant hyperthermia Neg Hx   . Pseudochol deficiency Neg Hx     Social History:  reports that she has never smoked. She has never used smokeless tobacco. She reports that she drinks alcohol. She reports that she does not use illicit drugs.  Allergies: No Known Allergies  No prescriptions prior to admission    ROS she notes a light discharge. She has mild stress incontinence with laughing or coughing. She has urge incontinence symptoms x6 months. Menses are particularly of the first 3 days of each cycle Last menstrual period 02/10/2011. Physical Exam general exam shows a healthy African American female alert oriented x3 weight  200.8 pounds, blood pressure 134/80 pulse 70s HEENT within normal limits neck supple mouth good oral care chest  clear to auscultation cardiac exam regular rate rhythm Abdomen shows a large mobile mass in the lower abdomen reaching to the umbilicus under a midline vertical scar the mass is firm and hard irregular she has had ultrasound which shows it to be a fibroid uterus. Normal external genitalia the vaginal length clear midcycle cervical mucus large mobile fibroid uterus filling the pelvis extending to the umbilicus 20 weeks size Extremities without cyanosis clubbing or edema bowel prep has been performed Hemoglobin & Hematocrit     Component Value Date/Time   HGB 11.1* 03/15/2011 1530   HCT 34.3* 03/15/2011 1530    No results found for this or any previous visit (from the past 24 hour(s)).  No results found. 03/20/2011 Assessment/Plan: Uterine fibroids 20 weeks size, symptomatic, status post failed effort at endometrial ablation Diabetes mellitus Hypertension Mild anemia Plan laparotomy with abdominal hysterectomy with removal of cervix. The plans are to to preserve the tubes and ovaries acknowledges that surgical findings may warrant removal of one or both tubes and ovaries  Risk of procedure including injury to adjacent organs has been reviewed with the patient in details. Potential for antibiotics, or transfusions reviewed with patient the patient has undergone bowel prep.   Mccade Sullenberger V 03/20/2011, 5:35 PM  Labs reviewed, patient interviewed, no changes in patient condition or surgical plan.  CBG 110 this am.

## 2011-03-21 ENCOUNTER — Encounter (HOSPITAL_COMMUNITY): Payer: Self-pay | Admitting: Anesthesiology

## 2011-03-21 ENCOUNTER — Inpatient Hospital Stay (HOSPITAL_COMMUNITY)
Admission: RE | Admit: 2011-03-21 | Discharge: 2011-03-23 | DRG: 743 | Disposition: A | Discharge status: Home / Self Care | Payer: 59 | Source: Ambulatory Visit | Attending: Obstetrics and Gynecology | Admitting: Obstetrics and Gynecology

## 2011-03-21 ENCOUNTER — Inpatient Hospital Stay (HOSPITAL_COMMUNITY): Payer: 59 | Admitting: Anesthesiology

## 2011-03-21 ENCOUNTER — Encounter (HOSPITAL_COMMUNITY)
Admission: RE | Disposition: A | Discharge status: Home / Self Care | Payer: Self-pay | Source: Ambulatory Visit | Attending: Obstetrics and Gynecology

## 2011-03-21 ENCOUNTER — Other Ambulatory Visit: Payer: Self-pay | Admitting: Obstetrics and Gynecology

## 2011-03-21 ENCOUNTER — Encounter (HOSPITAL_COMMUNITY): Payer: Self-pay | Admitting: *Deleted

## 2011-03-21 DIAGNOSIS — D252 Subserosal leiomyoma of uterus: Secondary | ICD-10-CM | POA: Diagnosis present

## 2011-03-21 DIAGNOSIS — I10 Essential (primary) hypertension: Secondary | ICD-10-CM | POA: Diagnosis present

## 2011-03-21 DIAGNOSIS — E119 Type 2 diabetes mellitus without complications: Secondary | ICD-10-CM | POA: Diagnosis present

## 2011-03-21 DIAGNOSIS — D250 Submucous leiomyoma of uterus: Principal | ICD-10-CM | POA: Diagnosis present

## 2011-03-21 DIAGNOSIS — D251 Intramural leiomyoma of uterus: Secondary | ICD-10-CM | POA: Diagnosis present

## 2011-03-21 DIAGNOSIS — Z01812 Encounter for preprocedural laboratory examination: Secondary | ICD-10-CM

## 2011-03-21 DIAGNOSIS — D649 Anemia, unspecified: Secondary | ICD-10-CM

## 2011-03-21 HISTORY — PX: SUPRACERVICAL ABDOMINAL HYSTERECTOMY: SHX5393

## 2011-03-21 LAB — GLUCOSE, CAPILLARY
Glucose-Capillary: 110 mg/dL — ABNORMAL HIGH (ref 70–99)
Glucose-Capillary: 182 mg/dL — ABNORMAL HIGH (ref 70–99)
Glucose-Capillary: 166 mg/dL — ABNORMAL HIGH (ref 70–99)
Glucose-Capillary: 163 mg/dL — ABNORMAL HIGH (ref 70–99)
Glucose-Capillary: 134 mg/dL — ABNORMAL HIGH (ref 70–99)

## 2011-03-21 SURGERY — HYSTERECTOMY, SUPRACERVICAL, ABDOMINAL
Anesthesia: General | Wound class: Clean

## 2011-03-21 MED ORDER — DIPHENHYDRAMINE HCL 50 MG/ML IJ SOLN: 12.5000 mg | Freq: Four times a day (QID) | INTRAMUSCULAR | Status: DC | PRN

## 2011-03-21 MED ORDER — KETOROLAC TROMETHAMINE 30 MG/ML IJ SOLN
30.0000 mg | Freq: Once | INTRAMUSCULAR | Status: AC
Start: 2011-03-21 — End: 2011-03-21
  Administered 2011-03-21: 30 mg via INTRAVENOUS

## 2011-03-21 MED ORDER — FENTANYL CITRATE 0.05 MG/ML IJ SOLN
INTRAMUSCULAR | Status: AC
  Administered 2011-03-21: 50 ug via INTRAVENOUS
  Filled 2011-03-21: qty 5

## 2011-03-21 MED ORDER — HYDROMORPHONE 0.3 MG/ML IV SOLN
INTRAVENOUS | Status: DC
  Administered 2011-03-21: 7.5 mg via INTRAVENOUS
  Administered 2011-03-21: 3.9 mg via INTRAVENOUS
  Administered 2011-03-21: 0.9 mg via INTRAVENOUS
  Administered 2011-03-22: 0.6 mg via INTRAVENOUS
  Administered 2011-03-22: 0.9 mg via INTRAVENOUS
  Administered 2011-03-22: 0.3 mg via INTRAVENOUS

## 2011-03-21 MED ORDER — GLYCOPYRROLATE 0.2 MG/ML IJ SOLN
INTRAMUSCULAR | Status: AC
  Filled 2011-03-21: qty 1

## 2011-03-21 MED ORDER — ROCURONIUM BROMIDE 50 MG/5ML IV SOLN
INTRAVENOUS | Status: AC
  Filled 2011-03-21: qty 1

## 2011-03-21 MED ORDER — ONDANSETRON HCL 4 MG/2ML IJ SOLN: 4.0000 mg | Freq: Once | INTRAMUSCULAR | Status: DC | PRN

## 2011-03-21 MED ORDER — FENTANYL CITRATE 0.05 MG/ML IJ SOLN
INTRAMUSCULAR | Status: DC | PRN
  Administered 2011-03-21 (×2): 25 ug via INTRAVENOUS
  Administered 2011-03-21: 50 ug via INTRAVENOUS
  Administered 2011-03-21 (×3): 25 ug via INTRAVENOUS
  Administered 2011-03-21 (×3): 50 ug via INTRAVENOUS
  Administered 2011-03-21: 25 ug via INTRAVENOUS
  Administered 2011-03-21 (×2): 50 ug via INTRAVENOUS

## 2011-03-21 MED ORDER — LIDOCAINE HCL (PF) 1 % IJ SOLN
INTRAMUSCULAR | Status: AC
  Filled 2011-03-21: qty 2

## 2011-03-21 MED ORDER — METFORMIN HCL 500 MG PO TABS
500.0000 mg | ORAL_TABLET | Freq: Two times a day (BID) | ORAL | Status: DC
  Administered 2011-03-21 – 2011-03-23 (×4): 500 mg via ORAL
  Filled 2011-03-21 (×4): qty 1

## 2011-03-21 MED ORDER — GLYCOPYRROLATE 0.2 MG/ML IJ SOLN
INTRAMUSCULAR | Status: DC | PRN
  Administered 2011-03-21: .4 mg via INTRAVENOUS
  Administered 2011-03-21: .2 mg via INTRAVENOUS

## 2011-03-21 MED ORDER — LISINOPRIL 10 MG PO TABS
20.0000 mg | ORAL_TABLET | Freq: Every day | ORAL | Status: DC
  Administered 2011-03-22 – 2011-03-23 (×2): 20 mg via ORAL
  Filled 2011-03-21 (×2): qty 2

## 2011-03-21 MED ORDER — OXYCODONE-ACETAMINOPHEN 5-325 MG PO TABS
1.0000 | ORAL_TABLET | ORAL | Status: DC | PRN
  Administered 2011-03-22 – 2011-03-23 (×3): 1 via ORAL
  Filled 2011-03-21 (×3): qty 1

## 2011-03-21 MED ORDER — PROPOFOL 10 MG/ML IV EMUL
INTRAVENOUS | Status: AC
  Filled 2011-03-21: qty 20

## 2011-03-21 MED ORDER — KETOROLAC TROMETHAMINE 30 MG/ML IJ SOLN
30.0000 mg | Freq: Four times a day (QID) | INTRAMUSCULAR | Status: DC
  Administered 2011-03-21 – 2011-03-22 (×3): 30 mg via INTRAVENOUS
  Filled 2011-03-21 (×3): qty 1

## 2011-03-21 MED ORDER — LISINOPRIL-HYDROCHLOROTHIAZIDE 20-12.5 MG PO TABS: 1.0000 | ORAL_TABLET | Freq: Every day | ORAL | Status: DC

## 2011-03-21 MED ORDER — PROPOFOL 10 MG/ML IV EMUL
INTRAVENOUS | Status: DC | PRN
  Administered 2011-03-21: 20 mg via INTRAVENOUS
  Administered 2011-03-21: 150 mg via INTRAVENOUS
  Administered 2011-03-21: 30 mg via INTRAVENOUS

## 2011-03-21 MED ORDER — LIDOCAINE HCL (PF) 1 % IJ SOLN
INTRAMUSCULAR | Status: AC
  Filled 2011-03-21: qty 5

## 2011-03-21 MED ORDER — FENTANYL CITRATE 0.05 MG/ML IJ SOLN
INTRAMUSCULAR | Status: AC
  Filled 2011-03-21: qty 2

## 2011-03-21 MED ORDER — DIPHENHYDRAMINE HCL 12.5 MG/5ML PO ELIX: 12.5000 mg | ORAL_SOLUTION | Freq: Four times a day (QID) | ORAL | Status: DC | PRN

## 2011-03-21 MED ORDER — SODIUM CHLORIDE 0.9 % IJ SOLN: 9.0000 mL | INTRAMUSCULAR | Status: DC | PRN

## 2011-03-21 MED ORDER — HYDROMORPHONE HCL PF 1 MG/ML IJ SOLN
0.5000 mg | INTRAMUSCULAR | Status: DC | PRN
  Administered 2011-03-21 (×5): 0.5 mg via INTRAVENOUS

## 2011-03-21 MED ORDER — ONDANSETRON HCL 4 MG/2ML IJ SOLN
4.0000 mg | Freq: Once | INTRAMUSCULAR | Status: AC
  Administered 2011-03-21: 4 mg via INTRAVENOUS

## 2011-03-21 MED ORDER — ROCURONIUM BROMIDE 100 MG/10ML IV SOLN
INTRAVENOUS | Status: DC | PRN
  Administered 2011-03-21: 10 mg via INTRAVENOUS
  Administered 2011-03-21: 5 mg via INTRAVENOUS
  Administered 2011-03-21: 45 mg via INTRAVENOUS
  Administered 2011-03-21 (×2): 10 mg via INTRAVENOUS
  Administered 2011-03-21: 5 mg via INTRAVENOUS

## 2011-03-21 MED ORDER — MIDAZOLAM HCL 2 MG/2ML IJ SOLN
INTRAMUSCULAR | Status: AC
  Administered 2011-03-21: 2 mg via INTRAVENOUS
  Filled 2011-03-21: qty 2

## 2011-03-21 MED ORDER — ONDANSETRON HCL 4 MG/2ML IJ SOLN
INTRAMUSCULAR | Status: AC
  Administered 2011-03-21: 4 mg via INTRAVENOUS
  Filled 2011-03-21: qty 2

## 2011-03-21 MED ORDER — KETOROLAC TROMETHAMINE 30 MG/ML IJ SOLN
INTRAMUSCULAR | Status: AC
  Filled 2011-03-21: qty 1

## 2011-03-21 MED ORDER — NALOXONE HCL 0.4 MG/ML IJ SOLN: 0.4000 mg | INTRAMUSCULAR | Status: DC | PRN

## 2011-03-21 MED ORDER — HYDROMORPHONE HCL PF 1 MG/ML IJ SOLN
INTRAMUSCULAR | Status: AC
  Administered 2011-03-21: 0.5 mg via INTRAVENOUS
  Filled 2011-03-21: qty 1

## 2011-03-21 MED ORDER — SODIUM CHLORIDE 0.9 % IR SOLN
Status: DC | PRN
  Administered 2011-03-21: 2000 mL

## 2011-03-21 MED ORDER — FENTANYL CITRATE 0.05 MG/ML IJ SOLN
25.0000 ug | INTRAMUSCULAR | Status: DC | PRN
  Administered 2011-03-21 (×2): 50 ug via INTRAVENOUS

## 2011-03-21 MED ORDER — DOCUSATE SODIUM 100 MG PO CAPS
100.0000 mg | ORAL_CAPSULE | Freq: Every day | ORAL | Status: DC
  Administered 2011-03-22 – 2011-03-23 (×2): 100 mg via ORAL
  Filled 2011-03-21 (×2): qty 1

## 2011-03-21 MED ORDER — MIDAZOLAM HCL 2 MG/2ML IJ SOLN
1.0000 mg | INTRAMUSCULAR | Status: DC | PRN
  Administered 2011-03-21: 2 mg via INTRAVENOUS

## 2011-03-21 MED ORDER — NEOSTIGMINE METHYLSULFATE 1 MG/ML IJ SOLN
INTRAMUSCULAR | Status: AC
  Filled 2011-03-21: qty 10

## 2011-03-21 MED ORDER — HYDROMORPHONE HCL PF 2 MG/ML IJ SOLN
INTRAMUSCULAR | Status: AC
  Administered 2011-03-21: 0.5 mg via INTRAVENOUS
  Filled 2011-03-21: qty 1

## 2011-03-21 MED ORDER — ONDANSETRON HCL 4 MG/2ML IJ SOLN
4.0000 mg | Freq: Four times a day (QID) | INTRAMUSCULAR | Status: DC | PRN
  Administered 2011-03-21: 4 mg via INTRAVENOUS
  Filled 2011-03-21: qty 2

## 2011-03-21 MED ORDER — CEFAZOLIN SODIUM-DEXTROSE 2-3 GM-% IV SOLR
2.0000 g | INTRAVENOUS | Status: AC
  Administered 2011-03-21: 2 g via INTRAVENOUS

## 2011-03-21 MED ORDER — FENTANYL CITRATE 0.05 MG/ML IJ SOLN
INTRAMUSCULAR | Status: AC
  Administered 2011-03-21: 50 ug via INTRAVENOUS
  Filled 2011-03-21: qty 2

## 2011-03-21 MED ORDER — HYDROMORPHONE 0.3 MG/ML IV SOLN
INTRAVENOUS | Status: AC
  Administered 2011-03-21: 7.5 mg via INTRAVENOUS
  Filled 2011-03-21: qty 25

## 2011-03-21 MED ORDER — PANTOPRAZOLE SODIUM 40 MG IV SOLR
40.0000 mg | Freq: Every day | INTRAVENOUS | Status: DC
  Administered 2011-03-21: 40 mg via INTRAVENOUS
  Filled 2011-03-21 (×2): qty 40

## 2011-03-21 MED ORDER — HYDROCHLOROTHIAZIDE 12.5 MG PO CAPS
12.5000 mg | ORAL_CAPSULE | Freq: Every day | ORAL | Status: DC
  Administered 2011-03-22 – 2011-03-23 (×2): 12.5 mg via ORAL
  Filled 2011-03-21 (×2): qty 1

## 2011-03-21 MED ORDER — PROMETHAZINE HCL 25 MG/ML IJ SOLN: 12.5000 mg | INTRAMUSCULAR | Status: DC | PRN

## 2011-03-21 MED ORDER — NEOSTIGMINE METHYLSULFATE 1 MG/ML IJ SOLN
INTRAMUSCULAR | Status: DC | PRN
  Administered 2011-03-21: 2 mg via INTRAVENOUS

## 2011-03-21 MED ORDER — POTASSIUM CHLORIDE IN NACL 20-0.45 MEQ/L-% IV SOLN
INTRAVENOUS | Status: DC
  Administered 2011-03-21 – 2011-03-22 (×3): via INTRAVENOUS
  Filled 2011-03-21 (×4): qty 1000

## 2011-03-21 MED ORDER — LIDOCAINE HCL 1 % IJ SOLN
INTRAMUSCULAR | Status: DC | PRN
  Administered 2011-03-21: 20 mg via INTRADERMAL

## 2011-03-21 MED ORDER — CEFAZOLIN SODIUM-DEXTROSE 2-3 GM-% IV SOLR
INTRAVENOUS | Status: AC
  Filled 2011-03-21: qty 50

## 2011-03-21 MED ORDER — ZOLPIDEM TARTRATE 5 MG PO TABS
5.0000 mg | ORAL_TABLET | Freq: Every evening | ORAL | Status: DC | PRN
  Administered 2011-03-22: 10 mg via ORAL
  Administered 2011-03-22: 5 mg via ORAL
  Filled 2011-03-21: qty 1
  Filled 2011-03-21: qty 2

## 2011-03-21 MED ORDER — KETOROLAC TROMETHAMINE 30 MG/ML IJ SOLN: 30.0000 mg | Freq: Four times a day (QID) | INTRAMUSCULAR | Status: DC

## 2011-03-21 MED ORDER — LACTATED RINGERS IV SOLN
INTRAVENOUS | Status: DC
  Administered 2011-03-21: 1000 mL via INTRAVENOUS
  Administered 2011-03-21: 09:00:00 via INTRAVENOUS

## 2011-03-21 MED ORDER — INSULIN ASPART 100 UNIT/ML ~~LOC~~ SOLN
0.0000 [IU] | Freq: Three times a day (TID) | SUBCUTANEOUS | Status: DC
  Administered 2011-03-21: 3 [IU] via SUBCUTANEOUS
  Administered 2011-03-22 (×3): 2 [IU] via SUBCUTANEOUS
  Filled 2011-03-21: qty 3

## 2011-03-21 SURGICAL SUPPLY — 61 items
APPLIER CLIP 11 MED OPEN (CLIP)
APPLIER CLIP 13 LRG OPEN (CLIP)
BAG HAMPER (MISCELLANEOUS) ×3 IMPLANT
BENZOIN TINCTURE PRP APPL 2/3 (GAUZE/BANDAGES/DRESSINGS) ×3 IMPLANT
BINDER ABD UNIV 9 30-45 (GAUZE/BANDAGES/DRESSINGS) ×2 IMPLANT
BINDER ABDOMINAL 9 (GAUZE/BANDAGES/DRESSINGS) ×3
CELLS DAT CNTRL 66122 CELL SVR (MISCELLANEOUS) IMPLANT
CLIP APPLIE 11 MED OPEN (CLIP) IMPLANT
CLIP APPLIE 13 LRG OPEN (CLIP) IMPLANT
CLOTH BEACON ORANGE TIMEOUT ST (SAFETY) ×3 IMPLANT
COVER LIGHT HANDLE STERIS (MISCELLANEOUS) ×9 IMPLANT
DRAPE WARM FLUID 44X44 (DRAPE) ×3 IMPLANT
DRESSING TELFA 8X3 (GAUZE/BANDAGES/DRESSINGS) ×3 IMPLANT
ELECT BLADE 6 FLAT ULTRCLN (ELECTRODE) ×3 IMPLANT
ELECT REM PT RETURN 9FT ADLT (ELECTROSURGICAL) ×3
ELECTRODE REM PT RTRN 9FT ADLT (ELECTROSURGICAL) ×2 IMPLANT
EVACUATOR DRAINAGE 10X20 100CC (DRAIN) IMPLANT
EVACUATOR SILICONE 100CC (DRAIN)
FORMALIN 10 PREFIL 480ML (MISCELLANEOUS) IMPLANT
GLOVE BIOGEL PI IND STRL 7.0 (GLOVE) ×2 IMPLANT
GLOVE BIOGEL PI IND STRL 7.5 (GLOVE) ×2 IMPLANT
GLOVE BIOGEL PI INDICATOR 7.0 (GLOVE) ×1
GLOVE BIOGEL PI INDICATOR 7.5 (GLOVE) ×1
GLOVE ECLIPSE 6.5 STRL STRAW (GLOVE) ×3 IMPLANT
GLOVE ECLIPSE 7.0 STRL STRAW (GLOVE) ×6 IMPLANT
GLOVE ECLIPSE 9.0 STRL (GLOVE) ×6 IMPLANT
GLOVE INDICATOR STER SZ 9 (GLOVE) ×3 IMPLANT
GOWN STRL REIN 3XL LVL4 (GOWN DISPOSABLE) ×3 IMPLANT
GOWN STRL REIN XL XLG (GOWN DISPOSABLE) ×6 IMPLANT
INST SET MAJOR GENERAL (KITS) ×3 IMPLANT
KIT ROOM TURNOVER APOR (KITS) ×3 IMPLANT
MANIFOLD NEPTUNE II (INSTRUMENTS) ×3 IMPLANT
NS IRRIG 1000ML POUR BTL (IV SOLUTION) ×9 IMPLANT
PACK ABDOMINAL MAJOR (CUSTOM PROCEDURE TRAY) ×3 IMPLANT
RETRACTOR WND ALEXIS 25 LRG (MISCELLANEOUS) IMPLANT
RTRCTR WOUND ALEXIS 18CM MED (MISCELLANEOUS)
RTRCTR WOUND ALEXIS 25CM LRG (MISCELLANEOUS)
SEPRAFILM MEMBRANE 5X6 (MISCELLANEOUS) IMPLANT
SET BASIN LINEN APH (SET/KITS/TRAYS/PACK) ×3 IMPLANT
SOL PREP PROV IODINE SCRUB 4OZ (MISCELLANEOUS) ×3 IMPLANT
SPONGE DRAIN TRACH 4X4 STRL 2S (GAUZE/BANDAGES/DRESSINGS) IMPLANT
SPONGE GAUZE 4X4 12PLY (GAUZE/BANDAGES/DRESSINGS) ×3 IMPLANT
SPONGE LAP 18X18 X RAY DECT (DISPOSABLE) IMPLANT
STAPLER VISISTAT 35W (STAPLE) IMPLANT
STRIP CLOSURE SKIN 1/2X4 (GAUZE/BANDAGES/DRESSINGS) ×3 IMPLANT
SUT CHROMIC 0 CT 1 (SUTURE) ×60 IMPLANT
SUT CHROMIC 2 0 CT 1 (SUTURE) ×6 IMPLANT
SUT CHROMIC GUT BROWN 0 54 (SUTURE) IMPLANT
SUT CHROMIC GUT BROWN 0 54IN (SUTURE)
SUT ETHILON 3 0 FSL (SUTURE) IMPLANT
SUT PDS AB CT VIOLET #0 27IN (SUTURE) IMPLANT
SUT PLAIN CT 1/2CIR 2-0 27IN (SUTURE) ×9 IMPLANT
SUT PROLENE 0 CT 1 30 (SUTURE) ×9 IMPLANT
SUT VIC AB 0 CT1 27 (SUTURE)
SUT VIC AB 0 CT1 27XBRD ANTBC (SUTURE) IMPLANT
SUT VIC AB 2-0 CT1 27 (SUTURE)
SUT VIC AB 2-0 CT1 TAPERPNT 27 (SUTURE) IMPLANT
SUT VICRYL 4 0 KS 27 (SUTURE) ×6 IMPLANT
TAPE CLOTH SURG 4X10 WHT LF (GAUZE/BANDAGES/DRESSINGS) ×3 IMPLANT
TOWEL BLUE STERILE X RAY DET (MISCELLANEOUS) ×3 IMPLANT
TRAY FOLEY CATH 14FR (SET/KITS/TRAYS/PACK) ×3 IMPLANT

## 2011-03-21 NOTE — Transfer of Care (Signed)
Immediate Anesthesia Transfer of Care Note  Patient: Amy Dean  Procedure(s) Performed:  HYSTERECTOMY SUPRACERVICAL ABDOMINAL  Patient Location: PACU  Anesthesia Type: General  Level of Consciousness: awake  Airway & Oxygen Therapy: Patient Spontanous Breathing and non-rebreather face mask  Post-op Assessment: Report given to PACU RN, Post -op Vital signs reviewed and stable and Patient moving all extremities  Post vital signs: Reviewed and stable  Complications: No apparent anesthesia complications

## 2011-03-21 NOTE — Addendum Note (Signed)
Addendum  created 03/21/11 1054 by Laurene Footman, MD   Modules edited:Orders, PRL Based Order Sets

## 2011-03-21 NOTE — Op Note (Signed)
12/30/2010  9:29 AM  PATIENT:    PRE-OPERATIVE DIAGNOSIS: Uterine fibroids  POST-OPERATIVE DIAGNOSIS:  Uterine fibroids 20 weeks size  PROCEDURE:  Procedure(s): Supracervical abdominal hysterectomy  SURGEON:Jae Skeet Benancio Deeds, MD  PHYSICIAN ASSISTANT: None  ADDITIONAL ASSISTANTS: Morrie Sheldon RNFA, [place] CST   ANESTHESIA:   general  ESTIMATED BLOOD LOSS: 200 cc   BLOOD ADMINISTERED:none  DRAINS: Urinary Catheter (Foley)   LOCAL MEDICATIONS USED:  NONE  SPECIMEN:  Source of Specimen:  Uterus  DISPOSITION OF SPECIMEN:  PATHOLOGY  COUNTS:  YES  TOURNIQUET:  * No tourniquets in log *  DICTATION #: Patient was taken to the operating room prepped and draped for lower surgery for a midline vertical incision after timeout conducted and IV Ancef administered 2 g prior to procedure. Midline vertical incision was repeated from umbilicus to symphysis pubis with careful entry of the peritoneal cavity were minimal adhesions were encountered, only a small number of omental adhesions to the anterior abdominal wall near the umbilicus. The large fibroid uterus was identified ovaries were identified is grossly normal round ligaments were taken down each side using 0 chromic suture and bladder flap developed on the anterior lower uterine segment. There was a small bladder flap hematoma that developed very quickly unresponsive this dissection but stabilized.  Left utero-ovarian ligament could be isolated clamped cut and suture ligated with Kelly clamps backbleeding was controlled with figure-of-eight sutures in the side of the uterine body. The right side was treated similarly. The uterus was rotated side the sidewall keeping the body of the uterus inside the abdominal cavity during this portion of the case. Uterine vessels were doubly clamped with curved Heaney clamps and they Kelly clamps placed on the uterine side for backbleeding control. 0 chromic suture then was used for ligature. The right side  had large veins required additional suturing. The uterine body could be amputated off the lower uterine segment for improved visibility and access. The lower uterine segment was then isolated by clamping the upper cardinal ligaments with straight Heaney clamps knife dissection and 0 chromic suture ligature. The cervix was found to be extremely elongated reaching and of the pelvis. The bladder flap hematoma and tissue distortion from prior cesarean sections made removal of the cervix technically challenging given the anatomy so decision was made to proceed with supracervical hysterectomy. The R. uterine segment was amputated off the cervical stump above the insertion of the uterosacral ligaments posteriorly and the cervical stump reapproximated with interrupted 0 chromic suture. Hemostasis was good. 3 cm of cervix and lower uterine segment were left in place. Bladder flap was loosely reapproximated over the stump with 2-0 chromic interrupted sutures. Pelvis was irrigated and pedicles were confirmed as hemostatic. Laparotomy equipment was removed. Anterior peritoneum was closed with 2-0 Vicryl to fascia was closed with running 0 Prolene beginning at each end and sewing to the midline. 10 units fat was reapproximated with interrupted 2-0 plain. Running subcuticular 3-0 Vicryl close the skin edges and Steri-Strips with benzoin were applied estimated blood loss 200 cc. Condition to recovery room in good  PLAN OF CARE: Inpatient care  PATIENT DISPOSITION:  PACU - hemodynamically stable.   Delay start of Pharmacological VTE agent (>24hrs) due to surgical blood loss or risk of bleeding: 0 Vicryl }

## 2011-03-21 NOTE — Anesthesia Preprocedure Evaluation (Addendum)
Anesthesia Evaluation  Patient identified by MRN, date of birth, ID band Patient awake    Reviewed: Allergy & Precautions, H&P , NPO status , Patient's Chart, lab work & pertinent test results  History of Anesthesia Complications Negative for: history of anesthetic complications  Airway Mallampati: II      Dental  (+) Teeth Intact   Pulmonary neg pulmonary ROS,  clear to auscultation        Cardiovascular hypertension, Pt. on medications Regular Normal    Neuro/Psych Negative Neurological ROS     GI/Hepatic   Endo/Other  Diabetes mellitus-, Well Controlled, Type 2, Oral Hypoglycemic Agents  Renal/GU      Musculoskeletal   Abdominal   Peds  (+) Gastroesophagael problems Hematology   Anesthesia Other Findings   Reproductive/Obstetrics                           Anesthesia Physical Anesthesia Plan  ASA: II  Anesthesia Plan: General   Post-op Pain Management:    Induction: Intravenous  Airway Management Planned: Oral ETT  Additional Equipment:   Intra-op Plan:   Post-operative Plan: Extubation in OR  Informed Consent: I have reviewed the patients History and Physical, chart, labs and discussed the procedure including the risks, benefits and alternatives for the proposed anesthesia with the patient or authorized representative who has indicated his/her understanding and acceptance.     Plan Discussed with:   Anesthesia Plan Comments:         Anesthesia Quick Evaluation

## 2011-03-21 NOTE — Brief Op Note (Signed)
03/21/2011  10:25 AM  PATIENT:  Amy Dean  45 y.o. female  PRE-OPERATIVE DIAGNOSIS:  Uterine Fibroids  POST-OPERATIVE DIAGNOSIS:  Uterine Fibroids, 20 week size  PROCEDURE:  Procedure(s): HYSTERECTOMY SUPRACERVICAL ABDOMINAL  SURGEON:  Surgeon(s): Tilda Burrow, MD  PHYSICIAN ASSISTANT:   ASSISTANTSMorrie Sheldon, RN-FA   ANESTHESIA:   general  EBL:  Total I/O In: 1900 [I.V.:1900] Out: 725 [Urine:500; Blood:225]  BLOOD ADMINISTERED:none  DRAINS: Urinary Catheter (Foley)   LOCAL MEDICATIONS USED:  NONE  SPECIMEN:  Source of Specimen:  uterus  DISPOSITION OF SPECIMEN:  PATHOLOGY  COUNTS:  YES  TOURNIQUET:  * No tourniquets in log *  DICTATION: .Dragon Dictation  PLAN OF CARE: Admit to inpatient   PATIENT DISPOSITION:  PACU - hemodynamically stable.   Delay start of Pharmacological VTE agent (>24hrs) due to surgical blood loss or risk of bleeding:  {YES/NO/NOT APPLICABLE:20182

## 2011-03-21 NOTE — Anesthesia Procedure Notes (Signed)
Procedure Name: Intubation Date/Time: 03/21/2011 7:47 AM Performed by: Minerva Areola Pre-anesthesia Checklist: Patient identified, Patient being monitored, Timeout performed, Emergency Drugs available and Suction available Patient Re-evaluated:Patient Re-evaluated prior to inductionOxygen Delivery Method: Circle System Utilized Preoxygenation: Pre-oxygenation with 100% oxygen Intubation Type: IV induction Ventilation: Mask ventilation without difficulty Laryngoscope Size: Miller and 2 Grade View: Grade I Tube type: Oral Tube size: 7.0 mm Number of attempts: 1 Airway Equipment and Method: stylet Placement Confirmation: ETT inserted through vocal cords under direct vision,  positive ETCO2 and breath sounds checked- equal and bilateral Secured at: 21 cm Tube secured with: Tape Dental Injury: Teeth and Oropharynx as per pre-operative assessment

## 2011-03-21 NOTE — Progress Notes (Signed)
Pt states drank all of movieprep 11/12 at 1630--bowels are clear.

## 2011-03-21 NOTE — Progress Notes (Signed)
Subjective:day of surgery, evening rounds Patient reports incisional pain.  Adequate control with pca pump  Objective: I have reviewed patient's vital signs, intake and output and orders.bp's have normalized  \  GI: incision: bloody drainage present over 1/4 of dressing , not increasing at present abd soft   Assessment/Plan: Pod-0 after supracervical hysterectomy. Stable condition. D/c foley in am  LOS: 0 days    Amy Dean V 03/21/2011, 9:20 PM

## 2011-03-21 NOTE — Anesthesia Postprocedure Evaluation (Signed)
Anesthesia Post Note  Patient: Amy Dean  Procedure(s) Performed:  HYSTERECTOMY SUPRACERVICAL ABDOMINAL  Anesthesia type: General  Patient location: PACU  Post pain: Pain level moderate  Post assessment: Post-op Vital signs reviewed, Patient's Cardiovascular Status Stable, Respiratory Function Stable, Patent Airway, No signs of Nausea or vomiting and Pain level controlled  Last Vitals:  Filed Vitals:   03/21/11 1011  BP: 195/94  Pulse: 78  Temp: 36.7 C  Resp: 20    Post vital signs: Reviewed and stable  Level of consciousness: awake and alert   Complications: No apparent anesthesia complications

## 2011-03-22 LAB — GLUCOSE, CAPILLARY
Glucose-Capillary: 130 mg/dL — ABNORMAL HIGH (ref 70–99)
Glucose-Capillary: 137 mg/dL — ABNORMAL HIGH (ref 70–99)
Glucose-Capillary: 147 mg/dL — ABNORMAL HIGH (ref 70–99)
Glucose-Capillary: 98 mg/dL (ref 70–99)

## 2011-03-22 LAB — CBC
HCT: 29.2 % — ABNORMAL LOW (ref 36.0–46.0)
Hemoglobin: 9.4 g/dL — ABNORMAL LOW (ref 12.0–15.0)
MCH: 24.7 pg — ABNORMAL LOW (ref 26.0–34.0)
MCHC: 32.2 g/dL (ref 30.0–36.0)
MCV: 76.8 fL — ABNORMAL LOW (ref 78.0–100.0)
Platelets: 236 10*3/uL (ref 150–400)
RBC: 3.8 MIL/uL — ABNORMAL LOW (ref 3.87–5.11)
RDW: 15.1 % (ref 11.5–15.5)
WBC: 5.3 10*3/uL (ref 4.0–10.5)

## 2011-03-22 LAB — BASIC METABOLIC PANEL
BUN: 6 mg/dL (ref 6–23)
CO2: 27 mEq/L (ref 19–32)
Calcium: 8.7 mg/dL (ref 8.4–10.5)
Chloride: 102 mEq/L (ref 96–112)
Creatinine, Ser: 0.67 mg/dL (ref 0.50–1.10)
GFR calc Af Amer: >90 mL/min (ref >90)
GFR calc non Af Amer: >90 mL/min (ref >90)
Glucose, Bld: 105 mg/dL — ABNORMAL HIGH (ref 70–99)
Potassium: 3.5 mEq/L (ref 3.5–5.1)
Sodium: 136 mEq/L (ref 135–145)

## 2011-03-22 MED ORDER — HYDROMORPHONE 0.3 MG/ML IV SOLN
INTRAVENOUS | Status: AC
  Administered 2011-03-22: 7.5 mg
  Filled 2011-03-22: qty 25

## 2011-03-22 MED ORDER — SIMETHICONE 80 MG PO CHEW
80.0000 mg | CHEWABLE_TABLET | Freq: Four times a day (QID) | ORAL | Status: DC | PRN
  Administered 2011-03-22: 80 mg via ORAL
  Filled 2011-03-22: qty 1

## 2011-03-22 MED ORDER — KETOROLAC TROMETHAMINE 10 MG PO TABS
10.0000 mg | ORAL_TABLET | Freq: Four times a day (QID) | ORAL | Status: AC
  Administered 2011-03-22 – 2011-03-23 (×3): 10 mg via ORAL
  Filled 2011-03-22 (×3): qty 1

## 2011-03-22 NOTE — Progress Notes (Signed)
1 Day Post-Op Procedure(s): HYSTERECTOMY SUPRACERVICAL ABDOMINAL  Subjective: Patient reports incisional pain, tolerating PO and no problems voiding.    Objective: I have reviewed patient's vital signs, intake and output and medications. Hemoglobin & Hematocrit     Component Value Date/Time   HGB 9.4* 03/22/2011 0513   HCT 29.2* 03/22/2011 0513   CBG (last 3)   Basename 03/21/11 2049 03/21/11 1712 03/21/11 1230  GLUCAP 134* 163* 166*   cbg this am 105  Resp: clear to auscultation bilaterally GI: soft, non-tender; bowel sounds normal; no masses,  no organomegaly and incision: bloody and no change in drainage present Extremities: Homans sign is negative, no sign of DVT  Assessment: s/p Procedure(s): HYSTERECTOMY SUPRACERVICAL ABDOMINAL: stable, tolerating diet and DM 2, controll adeq on metformin  and SSI  Plan: Advance diet reduce ivfluids, increase ambulation  LOS: 1 day    Amy Dean V 03/22/2011, 6:58 AM

## 2011-03-22 NOTE — Progress Notes (Signed)
UR Chart Review Completed  

## 2011-03-22 NOTE — Anesthesia Postprocedure Evaluation (Signed)
  Anesthesia Post-op Note  Patient: Amy Dean  Procedure(s) Performed:  HYSTERECTOMY SUPRACERVICAL ABDOMINAL  Patient Location: room 331  Anesthesia Type: General  Level of Consciousness: awake, alert , oriented and patient cooperative  Airway and Oxygen Therapy: Patient Spontanous Breathing  Post-op Pain: 2 /10, mild  Post-op Assessment: Post-op Vital signs reviewed, Patient's Cardiovascular Status Stable, Respiratory Function Stable, Patent Airway, No signs of Nausea or vomiting, Adequate PO intake and Pain level controlled  Post-op Vital Signs: Reviewed and stable  Complications: No apparent anesthesia complications

## 2011-03-22 NOTE — Addendum Note (Signed)
Addendum  created 03/22/11 0757 by Despina Hidden   Modules edited:Notes Section

## 2011-03-23 DIAGNOSIS — E119 Type 2 diabetes mellitus without complications: Secondary | ICD-10-CM

## 2011-03-23 LAB — GLUCOSE, CAPILLARY
Glucose-Capillary: 99 mg/dL (ref 70–99)
Glucose-Capillary: 77 mg/dL (ref 70–99)

## 2011-03-23 LAB — TYPE AND SCREEN
ABO/RH(D): O POS
Antibody Screen: NEGATIVE

## 2011-03-23 MED ORDER — HYDROCHLOROTHIAZIDE 12.5 MG PO CAPS: 12.5000 mg | ORAL_CAPSULE | Freq: Every day | ORAL | Status: DC

## 2011-03-23 MED ORDER — OXYCODONE-ACETAMINOPHEN 5-325 MG PO TABS: 1.0000 | ORAL_TABLET | ORAL | Status: AC | PRN

## 2011-03-23 MED ORDER — LISINOPRIL 20 MG PO TABS: 20.0000 mg | ORAL_TABLET | Freq: Every day | ORAL | Status: DC

## 2011-03-23 MED ORDER — DSS 100 MG PO CAPS: 100.0000 mg | ORAL_CAPSULE | Freq: Every day | ORAL | Status: AC

## 2011-03-23 NOTE — Progress Notes (Signed)
Pt discharged home with family in stable condition.  Reviewed d/c meds with pt, verbalizes understanding,.  Pt instructed to make appt to follow up with MD in 1 week or earlier if needed.  Discharge instructions to contact MD if temp > 100.4, if constipation is not relieved after increasing diet/fluids, if having difficulty breathing, if having severe uncontrolled pain,  and if having persistent N/V.  Pt instructed to not have sex for 6 weeks and no driving for 2.  When showering may use dial soap and to use abd binder when active.  Instructed to lift nothing > 15 lbs. Marland Kitchen

## 2011-03-23 NOTE — Progress Notes (Signed)
2 Days Post-Op Procedure(s): HYSTERECTOMY SUPRACERVICAL ABDOMINAL  Subjective: Patient reports tolerating PO, + flatus, + BM and no problems voiding.    Objective: I have reviewed patient's vital signs, intake and output, labs and pathology.  General: active mild pain only controlled c p.o meds GI: soft, non-tender; bowel sounds normal; no masses,  no organomegaly, normal findings: , abnormal findings:   and incision: bloody drainage present and noted on dressing, unchanged from day of surgery, dressing d/c'd Abd binder in place when ambulatory  Assessment: s/p Procedure(s): HYSTERECTOMY SUPRACERVICAL ABDOMINAL: progressing well  Plan: Encourage ambulation Discharge home  LOS: 2 days    Amy Dean V 03/23/2011, 12:07 PM

## 2011-03-23 NOTE — Discharge Summary (Signed)
Physician Discharge Summary  Patient ID: Amy Dean MRN: [id number] DOB/AGE: [birth year] 45 y.o.  Admit date: 03/21/2011 Discharge date: 03/23/2011  Admission Diagnoses: Symptomatic uterine fibroids 20 weeks  Discharge Diagnoses: Same, status post hysterectomy, supracervical Diabetes mellitus type II  Chronic hypertension stable Active Problems:  * No active hospital problems. *    Discharged Condition: stable  Hospital Course: This 46 year old female was admitted as described in history of present illness 03/11/2011 after outpatient bowel prep she underwent laparotomy with supracervical hysterectomy removing 1300 g benign fibroid uterus. Cervix was left in place tubes and ovaries were left in place and were grossly normal  Postoperative course was uneventful with blood sugars never above 150, blood pressure is adequately controlled on  regular blood pressure medicine Patient was stable on oral diabetic medicines and blood pressure medicines at the time of discharge, unchanged from admission values Consults: none  Significant Diagnostic Studies: labs:  Hemoglobin & Hematocrit     Component Value Date/Time   HGB 9.4* 03/22/2011 0513   HCT 29.2* 03/22/2011 0513   CBG (last 3)   Basename 03/23/11 1203 03/23/11 0749 03/22/11 2109  GLUCAP 77 99 98    and benign fibroid on pathology report  Treatments: surgery: Supracervical hysterectomy  Discharge Exam: Blood pressure 135/89, pulse 89, temperature 98.5 F (36.9 C), temperature source Oral, resp. rate 20, height 5\' 4"  (1.626 m), weight 91.581 kg (201 lb 14.4 oz), last menstrual period 02/10/2011, SpO2 97.00%. GI: soft, non-tender; bowel sounds normal; no masses,  no organomegaly and Active bowel sounds Incision/Wound: intact with Steri-Strips in place subcuticular wound closure  Disposition: Home or Self Care   Current Discharge Medication List    CONTINUE these medications which have NOT CHANGED   Details    lisinopril-hydrochlorothiazide (PRINZIDE,ZESTORETIC) 20-12.5 MG per tablet Take 1 tablet by mouth daily.      metFORMIN (GLUCOPHAGE) 500 MG tablet Take 500 mg by mouth 2 (two) times daily with a meal.          also prescription for Colace and Percocet given at discharge  Signed: Tilda Burrow 03/23/2011, 12:13 PM

## 2011-03-27 ENCOUNTER — Encounter (HOSPITAL_COMMUNITY): Payer: Self-pay | Admitting: Obstetrics and Gynecology

## 2011-07-17 ENCOUNTER — Other Ambulatory Visit: Payer: Self-pay | Admitting: Family Medicine

## 2011-07-18 ENCOUNTER — Ambulatory Visit (HOSPITAL_COMMUNITY)
Admission: RE | Admit: 2011-07-18 | Discharge: 2011-07-18 | Disposition: A | Discharge status: Home / Self Care | Payer: 59 | Source: Ambulatory Visit | Attending: Family Medicine | Admitting: Family Medicine

## 2011-07-18 DIAGNOSIS — R109 Unspecified abdominal pain: Secondary | ICD-10-CM | POA: Insufficient documentation

## 2011-09-29 ENCOUNTER — Ambulatory Visit (HOSPITAL_COMMUNITY)
Admission: RE | Admit: 2011-09-29 | Discharge: 2011-09-29 | Disposition: A | Discharge status: Home / Self Care | Payer: 59 | Source: Ambulatory Visit | Attending: Family Medicine | Admitting: Family Medicine

## 2011-09-29 ENCOUNTER — Other Ambulatory Visit: Payer: Self-pay | Admitting: Family Medicine

## 2011-09-29 DIAGNOSIS — R599 Enlarged lymph nodes, unspecified: Secondary | ICD-10-CM | POA: Insufficient documentation

## 2011-09-29 DIAGNOSIS — I10 Essential (primary) hypertension: Secondary | ICD-10-CM | POA: Insufficient documentation

## 2011-09-29 DIAGNOSIS — E119 Type 2 diabetes mellitus without complications: Secondary | ICD-10-CM | POA: Insufficient documentation

## 2011-09-29 DIAGNOSIS — R110 Nausea: Secondary | ICD-10-CM | POA: Insufficient documentation

## 2011-09-29 DIAGNOSIS — R109 Unspecified abdominal pain: Secondary | ICD-10-CM | POA: Insufficient documentation

## 2011-09-29 MED ORDER — IOHEXOL 300 MG/ML  SOLN
100.0000 mL | Freq: Once | INTRAMUSCULAR | Status: AC | PRN
  Administered 2011-09-29: 100 mL via INTRAVENOUS

## 2011-10-04 ENCOUNTER — Ambulatory Visit (INDEPENDENT_AMBULATORY_CARE_PROVIDER_SITE_OTHER): Payer: 59 | Admitting: Internal Medicine

## 2011-10-04 ENCOUNTER — Encounter (INDEPENDENT_AMBULATORY_CARE_PROVIDER_SITE_OTHER): Payer: Self-pay | Admitting: Internal Medicine

## 2011-10-04 VITALS — BP 112/80 | HR 84 | Temp 98.5°F | Ht 64.0 in | Wt 198.0 lb

## 2011-10-04 DIAGNOSIS — K5289 Other specified noninfective gastroenteritis and colitis: Secondary | ICD-10-CM

## 2011-10-04 DIAGNOSIS — R109 Unspecified abdominal pain: Secondary | ICD-10-CM

## 2011-10-04 DIAGNOSIS — R599 Enlarged lymph nodes, unspecified: Secondary | ICD-10-CM

## 2011-10-04 DIAGNOSIS — R9389 Abnormal findings on diagnostic imaging of other specified body structures: Secondary | ICD-10-CM

## 2011-10-04 DIAGNOSIS — R591 Generalized enlarged lymph nodes: Secondary | ICD-10-CM

## 2011-10-04 DIAGNOSIS — K529 Noninfective gastroenteritis and colitis, unspecified: Secondary | ICD-10-CM | POA: Insufficient documentation

## 2011-10-04 NOTE — Progress Notes (Signed)
Subjective:     Patient ID: Amy Dean, female   DOB: 08/06/1965, 46 y.o.   MRN: [id number]  HPI Amy Dean is a 46 yr old female referred to our office for possible enteritis. She tells me she had abdominal pain last week. The pain started last week. She saw Dr. Luking and she underwent a CT. ( se below). She tells me she had a similar episode back in March.  She underwent an US of the abdomen in March and it was normal.  She tells me she is still have abdominal pain. She rates the pain 7/10.  Her stools have been normal up to yesterday. She had diarrhea yesterday. She usually 2 stools a day.  No melena or bright red rectal bleeding.  Appetite is okay. No weight loss.  She says the pain radiates into her back.  She did have night sweats last weeks.      09/29/2011 CT abdomen and pelvis with CM:IMPRESSION:  1. Mild peripancreatic and retroperitoneal adenopathy with several  foci of stranding around mesenteric lymph nodes. This type of  stranding can be seen in inflammatory/reactive adenopathy or  malignancy. Enteritis with secondary nodal inflammation could  possibly have this appearance. Follow-up imaging may be warranted  09/29/2011 WBC 5.8, H and H 12.5 and 36.2, MCV 80.6, NA 136, K 3.7, Chloride 99, Glucose 190, BUN 7, Creatinine 0.66. Total bili 0.4, Direct less than 0.1, Indirect 0.3, ALP 99, AST 32, ALT 30, Lipase 50 .  Review of Systems see hpi Current Outpatient Prescriptions  Medication Sig Dispense Refill  . lisinopril-hydrochlorothiazide (PRINZIDE,ZESTORETIC) 20-12.5 MG per tablet Take 1 tablet by mouth daily.        . metFORMIN (GLUCOPHAGE) 500 MG tablet Take 500 mg by mouth 2 (two) times daily with a meal.         Past Medical History  Diagnosis Date  . Hypertension   . Diabetes mellitus   . Enteritis    History   Social History  . Marital Status: Married    Spouse Name: N/A    Number of Children: N/A  . Years of Education: N/A   Occupational History  . Not  on file.   Social History Main Topics  . Smoking status: Never Smoker   . Smokeless tobacco: Never Used  . Alcohol Use: Yes     occastionally  . Drug Use: No  . Sexually Active: Not on file   Other Topics Concern  . Not on file   Social History Narrative  . No narrative on file   Family Status  Relation Status Death Age  . Mother Deceased     Lung cancer  . Father Deceased     MI  . Sister Alive     good health   Past Surgical History  Procedure Date  . Cesarean section 1982 and 1989    x2  . Tonsillectomy   . Hysteroscopy w/ endometrial ablation 07/22/2008    K.Ross MD [facility]  . Tubal ligation   . Supraclavical abdominal hysterectomy 03/21/2011    Procedure: HYSTERECTOMY SUPRACERVICAL ABDOMINAL;  Surgeon: John V Ferguson, MD;  Location: [facility];  Service: Gynecology;;   No Known Allergies       Objective:   Physical Exam Filed Vitals:   10/04/11 1437  Height: 5' 4" (1.626 m)  Weight: 198 lb (89.812 kg)   Alert and oriented. Skin warm and dry. Oral mucosa is moist.   . Sclera anicteric, conjunctivae is pink. Thyroid not   enlarged. No cervical lymphadenopathy. Lungs clear. Heart regular rate and rhythm.  Abdomen is soft. Bowel sounds are positive. No hepatomegaly. No abdominal masses felt. Diffuse tenderness .  Epigastric tenderness. No edema to lower extremities. Patient is alert and oriented.     Assessment:    Abdominal pain. With retroperiotoneal adenopathy.  Dr. Rehman in to exam patient also.     Plan:  Upper GI-small bowel follow thru.  If this is normal:  EUS.  If pain reoccurs, she will call our office.      

## 2011-10-04 NOTE — Patient Instructions (Signed)
Upper GI with small bowel follow thru. Will get Lipid panel from Dr. Gerda Diss.

## 2011-10-05 ENCOUNTER — Ambulatory Visit (HOSPITAL_COMMUNITY)
Admission: RE | Admit: 2011-10-05 | Discharge: 2011-10-05 | Disposition: A | Discharge status: Home / Self Care | Payer: 59 | Source: Ambulatory Visit | Attending: Internal Medicine | Admitting: Internal Medicine

## 2011-10-05 DIAGNOSIS — R9389 Abnormal findings on diagnostic imaging of other specified body structures: Secondary | ICD-10-CM

## 2011-10-05 DIAGNOSIS — R109 Unspecified abdominal pain: Secondary | ICD-10-CM | POA: Insufficient documentation

## 2011-10-05 DIAGNOSIS — K529 Noninfective gastroenteritis and colitis, unspecified: Secondary | ICD-10-CM

## 2011-10-05 DIAGNOSIS — K5289 Other specified noninfective gastroenteritis and colitis: Secondary | ICD-10-CM | POA: Insufficient documentation

## 2011-10-05 DIAGNOSIS — R933 Abnormal findings on diagnostic imaging of other parts of digestive tract: Secondary | ICD-10-CM | POA: Insufficient documentation

## 2011-10-06 ENCOUNTER — Other Ambulatory Visit: Payer: Self-pay

## 2011-10-06 ENCOUNTER — Telehealth: Payer: Self-pay

## 2011-10-06 DIAGNOSIS — R599 Enlarged lymph nodes, unspecified: Secondary | ICD-10-CM

## 2011-10-06 DIAGNOSIS — R591 Generalized enlarged lymph nodes: Secondary | ICD-10-CM

## 2011-10-06 NOTE — Telephone Encounter (Signed)
Pt has been scheduled for EUS needs to be instructed and meds reviewed  Left message on machine to call back  

## 2011-10-09 ENCOUNTER — Encounter (INDEPENDENT_AMBULATORY_CARE_PROVIDER_SITE_OTHER): Payer: Self-pay

## 2011-10-09 NOTE — Telephone Encounter (Signed)
Left message on machine to call back  

## 2011-10-10 NOTE — Telephone Encounter (Signed)
Left message on machine to call back  

## 2011-10-10 NOTE — Telephone Encounter (Signed)
Pt has been instructed and meds reviewed she is going to ask for the day off at work and call back if she has any problems

## 2011-10-26 ENCOUNTER — Ambulatory Visit (HOSPITAL_COMMUNITY)
Admission: RE | Admit: 2011-10-26 | Discharge: 2011-10-26 | Disposition: A | Discharge status: Home / Self Care | Payer: 59 | Source: Ambulatory Visit | Attending: Gastroenterology | Admitting: Gastroenterology

## 2011-10-26 ENCOUNTER — Encounter (HOSPITAL_COMMUNITY)
Admission: RE | Disposition: A | Discharge status: Home / Self Care | Payer: Self-pay | Source: Ambulatory Visit | Attending: Gastroenterology

## 2011-10-26 ENCOUNTER — Encounter (HOSPITAL_COMMUNITY): Payer: Self-pay | Admitting: Gastroenterology

## 2011-10-26 DIAGNOSIS — M549 Dorsalgia, unspecified: Secondary | ICD-10-CM | POA: Insufficient documentation

## 2011-10-26 DIAGNOSIS — R61 Generalized hyperhidrosis: Secondary | ICD-10-CM | POA: Insufficient documentation

## 2011-10-26 DIAGNOSIS — K5289 Other specified noninfective gastroenteritis and colitis: Secondary | ICD-10-CM

## 2011-10-26 DIAGNOSIS — R599 Enlarged lymph nodes, unspecified: Secondary | ICD-10-CM | POA: Insufficient documentation

## 2011-10-26 DIAGNOSIS — I10 Essential (primary) hypertension: Secondary | ICD-10-CM | POA: Insufficient documentation

## 2011-10-26 DIAGNOSIS — E119 Type 2 diabetes mellitus without complications: Secondary | ICD-10-CM | POA: Insufficient documentation

## 2011-10-26 DIAGNOSIS — R933 Abnormal findings on diagnostic imaging of other parts of digestive tract: Secondary | ICD-10-CM

## 2011-10-26 DIAGNOSIS — Z8719 Personal history of other diseases of the digestive system: Secondary | ICD-10-CM | POA: Insufficient documentation

## 2011-10-26 DIAGNOSIS — R109 Unspecified abdominal pain: Secondary | ICD-10-CM | POA: Insufficient documentation

## 2011-10-26 DIAGNOSIS — R197 Diarrhea, unspecified: Secondary | ICD-10-CM | POA: Insufficient documentation

## 2011-10-26 DIAGNOSIS — Z79899 Other long term (current) drug therapy: Secondary | ICD-10-CM | POA: Insufficient documentation

## 2011-10-26 DIAGNOSIS — K529 Noninfective gastroenteritis and colitis, unspecified: Secondary | ICD-10-CM

## 2011-10-26 HISTORY — DX: Anemia, unspecified: D64.9

## 2011-10-26 HISTORY — PX: EUS: SHX5427

## 2011-10-26 LAB — GLUCOSE, CAPILLARY: Glucose-Capillary: 179 mg/dL — ABNORMAL HIGH (ref 70–99)

## 2011-10-26 LAB — ANGIOTENSIN CONVERTING ENZYME: Angiotensin-Converting Enzyme: 1 U/L — ABNORMAL LOW (ref 8–52)

## 2011-10-26 SURGERY — UPPER ENDOSCOPIC ULTRASOUND (EUS) LINEAR
Anesthesia: Moderate Sedation

## 2011-10-26 MED ORDER — FENTANYL CITRATE 0.05 MG/ML IJ SOLN
INTRAMUSCULAR | Status: DC | PRN
  Administered 2011-10-26: 12.5 ug via INTRAVENOUS
  Administered 2011-10-26 (×3): 25 ug via INTRAVENOUS
  Administered 2011-10-26: 12.5 ug via INTRAVENOUS

## 2011-10-26 MED ORDER — MIDAZOLAM HCL 10 MG/2ML IJ SOLN
INTRAMUSCULAR | Status: DC | PRN
  Administered 2011-10-26: 1 mg via INTRAVENOUS
  Administered 2011-10-26: 2 mg via INTRAVENOUS
  Administered 2011-10-26 (×2): 1 mg via INTRAVENOUS
  Administered 2011-10-26: 2 mg via INTRAVENOUS
  Administered 2011-10-26 (×2): 1 mg via INTRAVENOUS
  Administered 2011-10-26: 2 mg via INTRAVENOUS

## 2011-10-26 MED ORDER — DIPHENHYDRAMINE HCL 50 MG/ML IJ SOLN
INTRAMUSCULAR | Status: DC | PRN
  Administered 2011-10-26: 25 mg via INTRAVENOUS

## 2011-10-26 MED ORDER — DIPHENHYDRAMINE HCL 50 MG/ML IJ SOLN
INTRAMUSCULAR | Status: AC
  Filled 2011-10-26: qty 1

## 2011-10-26 MED ORDER — MIDAZOLAM HCL 10 MG/2ML IJ SOLN
INTRAMUSCULAR | Status: AC
  Filled 2011-10-26: qty 4

## 2011-10-26 MED ORDER — SODIUM CHLORIDE 0.9 % IV SOLN: INTRAVENOUS | Status: DC

## 2011-10-26 MED ORDER — FENTANYL CITRATE 0.05 MG/ML IJ SOLN
INTRAMUSCULAR | Status: AC
  Filled 2011-10-26: qty 4

## 2011-10-26 NOTE — Interval H&P Note (Signed)
History and Physical Interval Note:  10/26/2011 7:47 AM  Amy Dean  has presented today for surgery, with the diagnosis of abdominal  The various methods of treatment have been discussed with the patient and family. After consideration of risks, benefits and other options for treatment, the patient has consented to  Procedure(s) (LRB): UPPER ENDOSCOPIC ULTRASOUND (EUS) LINEAR (N/A) as a surgical intervention .  The patient's history has been reviewed, patient examined, no change in status, stable for surgery.  I have reviewed the patients' chart and labs.  Questions were answered to the patient's satisfaction.     Rob Bunting

## 2011-10-26 NOTE — H&P (View-Only) (Signed)
Subjective:     Patient ID: Amy Dean, female   DOB: 06-02-1965, 46 y.o.   MRN: [id number]  HPI Amit is a 46 yr old female referred to our office for possible enteritis. She tells me she had abdominal pain last week. The pain started last week. She saw Dr. Gerda Diss and she underwent a CT. ( se below). She tells me she had a similar episode back in March.  She underwent an US of the abdomen in March and it was normal.  She tells me she is still have abdominal pain. She rates the pain 7/10.  Her stools have been normal up to yesterday. She had diarrhea yesterday. She usually 2 stools a day.  No melena or bright red rectal bleeding.  Appetite is okay. No weight loss.  She says the pain radiates into her back.  She did have night sweats last weeks.      09/29/2011 CT abdomen and pelvis with CM:IMPRESSION:  1. Mild peripancreatic and retroperitoneal adenopathy with several  foci of stranding around mesenteric lymph nodes. This type of  stranding can be seen in inflammatory/reactive adenopathy or  malignancy. Enteritis with secondary nodal inflammation could  possibly have this appearance. Follow-up imaging may be warranted  09/29/2011 WBC 5.8, H and H 12.5 and 36.2, MCV 80.6, NA 136, K 3.7, Chloride 99, Glucose 190, BUN 7, Creatinine 0.66. Total bili 0.4, Direct less than 0.1, Indirect 0.3, ALP 99, AST 32, ALT 30, Lipase 50 .  Review of Systems see hpi Current Outpatient Prescriptions  Medication Sig Dispense Refill  . lisinopril-hydrochlorothiazide (PRINZIDE,ZESTORETIC) 20-12.5 MG per tablet Take 1 tablet by mouth daily.        . metFORMIN (GLUCOPHAGE) 500 MG tablet Take 500 mg by mouth 2 (two) times daily with a meal.         Past Medical History  Diagnosis Date  . Hypertension   . Diabetes mellitus   . Enteritis    History   Social History  . Marital Status: Married    Spouse Name: N/A    Number of Children: N/A  . Years of Education: N/A   Occupational History  . Not  on file.   Social History Main Topics  . Smoking status: Never Smoker   . Smokeless tobacco: Never Used  . Alcohol Use: Yes     occastionally  . Drug Use: No  . Sexually Active: Not on file   Other Topics Concern  . Not on file   Social History Narrative  . No narrative on file   Family Status  Relation Status Death Age  . Mother Deceased     Lung cancer  . Father Deceased     MI  . Sister Alive     good health   Past Surgical History  Procedure Date  . Cesarean section 1982 and 1989    x2  . Tonsillectomy   . Hysteroscopy w/ endometrial ablation 07/22/2008    K.Tenny Craw MD [facility]  . Tubal ligation   . Supraclavical abdominal hysterectomy 03/21/2011    Procedure: HYSTERECTOMY SUPRACERVICAL ABDOMINAL;  Surgeon: Tilda Burrow, MD;  Location: [facility];  Service: Gynecology;;   No Known Allergies       Objective:   Physical Exam Filed Vitals:   10/04/11 1437  Height: 5\' 4"  (1.626 m)  Weight: 198 lb (89.812 kg)   Alert and oriented. Skin warm and dry. Oral mucosa is moist.   . Sclera anicteric, conjunctivae is pink. Thyroid not  enlarged. No cervical lymphadenopathy. Lungs clear. Heart regular rate and rhythm.  Abdomen is soft. Bowel sounds are positive. No hepatomegaly. No abdominal masses felt. Diffuse tenderness .  Epigastric tenderness. No edema to lower extremities. Patient is alert and oriented.     Assessment:    Abdominal pain. With retroperiotoneal adenopathy.  Dr. Karilyn Cota in to exam patient also.     Plan:  Upper GI-small bowel follow thru.  If this is normal:  EUS.  If pain reoccurs, she will call our office.

## 2011-10-26 NOTE — Op Note (Signed)
[facility] [address] [place], [place]  [address]  ENDOSCOPIC ULTRASOUND PROCEDURE REPORT  PATIENT:  Amy Dean, Amy Dean  MR#:  [id number] BIRTHDATE:  [birth year]  GENDER:  female ENDOSCOPIST:  Rachael Fee, MD REFERRED BY:  Lionel December, M.D. PROCEDURE DATE:  10/26/2011 PROCEDURE:  Upper EUS w/FNA ASA CLASS:  Class II INDICATIONS:  recent abd pain led to [place] and then CT; pain has improved but not normal yet.  CT showed scattered retroperitoneal adenopathy, peripancreatic adenopathy.  No pancreatic masses. LFTs normal, no recent weight loss.  Mother had sarcoidosis. MEDICATIONS:   Fentanyl 100 mcg IV, Benadryl 25 mg IV, Versed 11 mg IV DESCRIPTION OF PROCEDURE:   After the risks benefits and alternatives of the procedure were  explained, informed consent was obtained. The patient was then placed in the left, lateral, decubitus postion and IV sedation was administered. Throughout the procedure, the patient's blood pressure, pulse and oxygen saturations were monitored continuously.  Under direct visualization, the Pentax EUS Radial [id number] endoscope was introduced through the mouth and advanced to the second portion of the duodenum.  Water was used as necessary to provide an acoustic interface.  Upon completion of the imaging, water was removed and the patient was sent to the recovery room in satisfactory condition. <<PROCEDUREIMAGES>>  Endoscopic findings (limited views with radial and linear echoendoscopes):1. Normal UGI tract  EUS findings: 1. Scattered retroperitoneal (peripancreatic, periportal adenopathy). The largest chain of lymhnodes in periportal region measured 4cm long. A lymphnode closer to stomach wall (still in retroperitoneum, periportal region) measured 2.6cm across, was sampled with 2 passes of a 19 guage BS EUS FNA needle. 2. Normal pancreatic parenchyma without discrete masses 3. Normal main pancreatic duct 4. CBD was normal,  non-dilated 5. Gallbladder normal  Impression:Scattered retroperitoneal (peripancreatic and periportal adenopathy), otherwise normal examination.  This was sampled with FNA, awaiting final cytology reading. Her mother had sarcoid which can cause adenopathy such as hers, will check serum ACE level today before discharge.  ______________________________ Rachael Fee, MD  n. eSIGNED:   Rachael Fee at 10/26/2011 09:13 AM  Bruce Donath, [id number]

## 2011-10-26 NOTE — Discharge Instructions (Signed)

## 2011-10-27 ENCOUNTER — Encounter (HOSPITAL_COMMUNITY): Payer: Self-pay | Admitting: Gastroenterology

## 2011-11-07 ENCOUNTER — Telehealth: Payer: Self-pay

## 2011-11-07 NOTE — Telephone Encounter (Signed)
Message copied by Donata Duff on Tue Nov 07, 2011  8:01 AM ------      Message from: Rachael Fee      Created: Tue Nov 07, 2011  7:36 AM       Finnlee Silvernail,      Can you put her in for recall EUS in 3 months (with propofol), [facility].            Thanks            ----- Message -----         From: Malissa Hippo, MD         Sent: 11/02/2011   4:46 PM           To: Rachael Fee, MD, [place] Folk with patient and she actually doing better and having less pain. iagree with plans to repeat EUS in 3 months and patient is also agreeable.      We will see her in office in 6 weeks and if there is any change, i will let you know.      ----- Message -----         From: Rachael Fee, MD         Sent: 10/26/2011   9:24 AM           To: Malissa Hippo, MD            Najeeb,      Just completed EUS, see below:            Impression:      Scattered retroperitoneal (peripancreatic and periportal adenopathy), otherwise normal examination.  This was sampled with FNA, awaiting final cytology reading. Her mother had sarcoid which can cause adenopathy such as hers, will check serum ACE level today before discharge.            I'll let you know.  Sarcoid can be difficult to prove, but that seems more likely than malignancy at this point.                    dan

## 2011-11-07 NOTE — Telephone Encounter (Signed)
Recall in EPIC 

## 2011-12-18 ENCOUNTER — Encounter (INDEPENDENT_AMBULATORY_CARE_PROVIDER_SITE_OTHER): Payer: Self-pay | Admitting: Internal Medicine

## 2011-12-18 ENCOUNTER — Ambulatory Visit (INDEPENDENT_AMBULATORY_CARE_PROVIDER_SITE_OTHER): Payer: 59 | Admitting: Internal Medicine

## 2011-12-18 VITALS — BP 122/80 | HR 78 | Temp 98.2°F | Resp 20 | Ht 64.0 in | Wt 197.4 lb

## 2011-12-18 DIAGNOSIS — K219 Gastro-esophageal reflux disease without esophagitis: Secondary | ICD-10-CM | POA: Insufficient documentation

## 2011-12-18 DIAGNOSIS — R109 Unspecified abdominal pain: Secondary | ICD-10-CM

## 2011-12-18 NOTE — Progress Notes (Signed)
Presenting complaint;  Followup for upper abdominal pain.  Subjective:  Patient is 46 year old African American female who was seen on 10/04/2011 for upper abdominal pain. First episode occurred in March and the second episode was in third week of May,2013 when she was evaluated by Dr. Lilyan Punt and underwent abdominal pelvic CT which revealed focal changes of enteritis and prominent retroperitoneal lymph nodes. Upper GI series with small bowel follow-through was normal at this study was done a week after her CT. She underwent EUS with FNA by Dr. Wendall Papa. She had prominent peripancreatic and periportal lymph nodes. FNA was negative for neoplastic process. He also checks her ACE level which was low. Patient has not experienced any more episodes of pain. She has good appetite. She has intermittent heartburn with certain foods. She is using omeprazole on as-needed basis. She denies nausea vomiting fever or chills. She also denies melena or rectal bleeding or diarrhea. Patient states that she was taking Goody powder on regular basis when she had these episodes of pain. She has not taken pain medication in several weeks  Current Medications: Current Outpatient Prescriptions  Medication Sig Dispense Refill  . acetaminophen (TYLENOL) 500 MG tablet Take 500 mg by mouth as needed.      Marland Kitchen HYDROcodone-acetaminophen (NORCO) 10-325 MG per tablet Take 1 tablet by mouth every 6 (six) hours as needed.      Marland Kitchen lisinopril-hydrochlorothiazide (PRINZIDE,ZESTORETIC) 20-12.5 MG per tablet Take 1 tablet by mouth daily.        . metFORMIN (GLUCOPHAGE) 500 MG tablet Take 500 mg by mouth 2 (two) times daily with a meal.        . pravastatin (PRAVACHOL) 20 MG tablet Take 20 mg by mouth daily.         Objective: Blood pressure 122/80, pulse 78, temperature 98.2 F (36.8 C), temperature source Oral, resp. rate 20, height 5\' 4"  (1.626 m), weight 197 lb 6.4 oz (89.54 kg), last menstrual period 02/10/2011. Patient is  alert and in no acute distress. Conjunctiva is pink. Sclera is nonicteric Oropharyngeal mucosa is normal. No neck masses or thyromegaly noted. Abdomen is full. Bowel sounds are normal. Abdomen is soft and nontender without organomegaly or masses.  No LE edema or clubbing noted.    Assessment: #1. Upper abdominal pain secondary to acute enteritis. Abdominal CT also revealed prominent retroperitoneal lymph nodes. EUS with FNA by Dr. Christella Hartigan on 10/26/2011 was negative for neoplastic or granulomatous process. Acute symptoms may have been secondary to frequent use of Goody powder or self-limiting process such as viral enteritis. Patient will need followup EUS to make sure adenopathy not progressive.   Plan:  Patient will call us if her abdominal pain recurs. She will have followup EUS by Dr. Wendall Papa in September 2013 to make sure she does not have progressive lymphadenopathy. Office visit in one year.   Copy  to Dr. Tessie Fass. Copy to Dr. Wendall Papa, MD.

## 2011-12-18 NOTE — Patient Instructions (Addendum)
Notify if your symptoms of abdominal pain recurs. Endoscopic ultrasound(EUS) to be performed by Dr. Wendall Papa in September 2013. His office will contact you to schedule this test.

## 2012-02-01 ENCOUNTER — Other Ambulatory Visit: Payer: Self-pay | Admitting: Family Medicine

## 2012-02-01 DIAGNOSIS — Z139 Encounter for screening, unspecified: Secondary | ICD-10-CM

## 2012-03-04 ENCOUNTER — Ambulatory Visit (HOSPITAL_COMMUNITY)
Admission: RE | Admit: 2012-03-04 | Discharge: 2012-03-04 | Disposition: A | Discharge status: Home / Self Care | Payer: 59 | Source: Ambulatory Visit | Attending: Family Medicine | Admitting: Family Medicine

## 2012-03-04 DIAGNOSIS — Z1231 Encounter for screening mammogram for malignant neoplasm of breast: Secondary | ICD-10-CM | POA: Insufficient documentation

## 2012-03-04 DIAGNOSIS — Z139 Encounter for screening, unspecified: Secondary | ICD-10-CM

## 2012-03-13 ENCOUNTER — Telehealth: Payer: Self-pay

## 2012-03-13 NOTE — Telephone Encounter (Signed)
Pt needs repeat eus per recall adenopathy with propofol r/l

## 2012-03-14 NOTE — Telephone Encounter (Signed)
Left message on machine to call back  

## 2012-03-19 NOTE — Telephone Encounter (Signed)
Left message on machine to call back letter mailed. 

## 2012-08-26 ENCOUNTER — Other Ambulatory Visit: Payer: Self-pay | Admitting: Nurse Practitioner

## 2012-09-16 ENCOUNTER — Other Ambulatory Visit (HOSPITAL_COMMUNITY): Payer: Self-pay | Admitting: Family Medicine

## 2012-09-18 ENCOUNTER — Encounter: Payer: Self-pay | Admitting: *Deleted

## 2012-09-19 ENCOUNTER — Ambulatory Visit (INDEPENDENT_AMBULATORY_CARE_PROVIDER_SITE_OTHER): Payer: 59 | Admitting: Family Medicine

## 2012-09-19 ENCOUNTER — Encounter: Payer: Self-pay | Admitting: Family Medicine

## 2012-09-19 VITALS — BP 128/82 | Wt 198.0 lb

## 2012-09-19 DIAGNOSIS — Z79899 Other long term (current) drug therapy: Secondary | ICD-10-CM

## 2012-09-19 DIAGNOSIS — E785 Hyperlipidemia, unspecified: Secondary | ICD-10-CM

## 2012-09-19 DIAGNOSIS — E119 Type 2 diabetes mellitus without complications: Secondary | ICD-10-CM

## 2012-09-19 MED ORDER — METFORMIN HCL 500 MG PO TABS: 1000.0000 mg | ORAL_TABLET | Freq: Two times a day (BID) | ORAL | Status: DC

## 2012-09-19 NOTE — Progress Notes (Signed)
  Subjective:    Patient ID: Amy Dean, female    DOB: 04-30-1966, 47 y.o.   MRN: [id number]  Hypertension This is a chronic problem. The current episode started more than 1 year ago. The problem has been gradually improving since onset. The problem is controlled. Pertinent negatives include no chest pain, malaise/fatigue, palpitations or shortness of breath. Risk factors for coronary artery disease include diabetes mellitus and dyslipidemia. Past treatments include ACE inhibitors and diuretics. The current treatment provides moderate improvement. There are no compliance problems.  There is no history of angina, CVA, heart failure, left ventricular hypertrophy or PVD.  Diabetes She presents for her follow-up diabetic visit. She has type 2 diabetes mellitus. Her disease course has been improving. There are no hypoglycemic associated symptoms. Pertinent negatives for diabetes include no chest pain, no fatigue, no foot ulcerations and no weakness. Pertinent negatives for diabetic complications include no CVA, heart disease or PVD. Risk factors for coronary artery disease include diabetes mellitus and dyslipidemia. Current diabetic treatment includes oral agent (monotherapy). She is compliant with treatment all of the time. She is following a diabetic diet. Meal planning includes avoidance of concentrated sweets.      Review of Systems  Constitutional: Negative for malaise/fatigue and fatigue.  Respiratory: Negative for shortness of breath.   Cardiovascular: Negative for chest pain and palpitations.  Neurological: Negative for weakness.       Objective:   Physical Exam  Vitals reviewed. Constitutional: She is oriented to person, place, and time. She appears well-developed and well-nourished.  HENT:  Head: Normocephalic.  Right Ear: External ear normal.  Left Ear: External ear normal.  Eyes: Pupils are equal, round, and reactive to light.  Neck: Normal range of motion. No thyromegaly  present.  Cardiovascular: Normal rate, regular rhythm, normal heart sounds and intact distal pulses.   No murmur heard. Pulmonary/Chest: Effort normal and breath sounds normal. No respiratory distress. She has no wheezes.  Abdominal: Bowel sounds are normal.  Musculoskeletal: Normal range of motion. She exhibits no edema and no tenderness.  Lymphadenopathy:    She has no cervical adenopathy.  Neurological: She is alert and oriented to person, place, and time. She exhibits normal muscle tone.  Skin: Skin is warm and dry.  Psychiatric: She has a normal mood and affect. Her behavior is normal.   see. Diabetic foot exam       Assessment & Plan:  Hypertension-decent control she is to continue current measures Hyperlipidemia-continue pravastatin continue regular taking of medication and watching diet. Check lab work Diabetes subpar control hopefully A1c in the future looks better I told her to increase metformin 500 mg 2 in the morning 2 in the evening more than likely she will end up needing to be on glipizide as well. She is to monitor her morning sugars if they are not staying in the 100-130 range or if 2 hours after supper is not below 150 she will need additional medications she is to let us know she is due A1c at the end of the month. Her A1c is monitored by health nurse who at since S. This information. We did order additional testing await the results.

## 2012-09-19 NOTE — Patient Instructions (Signed)
Metformin-take 2 tablets in the morning 2 in the evening. If this causes diarrhea please let us know. Our goal is to see her morning sugar near 100. Your sugar 2 hours after meal times should be 150 or less. If your numbers continue to stay high please inform us. I would like to see you back in approximately 4 months. Please do your labwork as discussed.

## 2012-09-20 ENCOUNTER — Other Ambulatory Visit: Payer: Self-pay | Admitting: Family Medicine

## 2012-09-21 ENCOUNTER — Other Ambulatory Visit: Payer: Self-pay | Admitting: *Deleted

## 2012-09-21 MED ORDER — LISINOPRIL-HYDROCHLOROTHIAZIDE 20-12.5 MG PO TABS: 1.0000 | ORAL_TABLET | Freq: Every day | ORAL | Status: DC

## 2012-09-28 LAB — LIPID PANEL
Cholesterol: 151 mg/dL (ref 0–200)
HDL: 29 mg/dL — ABNORMAL LOW (ref >39)
LDL Cholesterol: 94 mg/dL (ref 0–99)
Total CHOL/HDL Ratio: 5.2 Ratio
Triglycerides: 141 mg/dL (ref <150)
VLDL: 28 mg/dL (ref 0–40)

## 2012-09-28 LAB — MICROALBUMIN, URINE: Microalb, Ur: 1.17 mg/dL (ref 0.00–1.89)

## 2012-09-28 LAB — BASIC METABOLIC PANEL
BUN: 8 mg/dL (ref 6–23)
CO2: 24 mEq/L (ref 19–32)
Calcium: 9.5 mg/dL (ref 8.4–10.5)
Chloride: 102 mEq/L (ref 96–112)
Creat: 0.69 mg/dL (ref 0.50–1.10)
Glucose, Bld: 192 mg/dL — ABNORMAL HIGH (ref 70–99)
Potassium: 3.9 mEq/L (ref 3.5–5.3)
Sodium: 136 mEq/L (ref 135–145)

## 2012-09-28 LAB — HEPATIC FUNCTION PANEL
ALT: 21 U/L (ref 0–35)
AST: 26 U/L (ref 0–37)
Albumin: 3.9 g/dL (ref 3.5–5.2)
Alkaline Phosphatase: 73 U/L (ref 39–117)
Bilirubin, Direct: 0.1 mg/dL (ref 0.0–0.3)
Indirect Bilirubin: 0.5 mg/dL (ref 0.0–0.9)
Total Bilirubin: 0.6 mg/dL (ref 0.3–1.2)
Total Protein: 6.6 g/dL (ref 6.0–8.3)

## 2012-10-01 ENCOUNTER — Encounter: Payer: Self-pay | Admitting: Family Medicine

## 2012-12-23 ENCOUNTER — Encounter (INDEPENDENT_AMBULATORY_CARE_PROVIDER_SITE_OTHER): Payer: Self-pay | Admitting: *Deleted

## 2012-12-30 ENCOUNTER — Telehealth (HOSPITAL_COMMUNITY): Payer: Self-pay | Admitting: Dietician

## 2012-12-30 NOTE — Telephone Encounter (Signed)
Received referral from [facility] [place] [facility] [employer]). RNCM feels pt would benefit from DM classes.

## 2012-12-30 NOTE — Telephone Encounter (Signed)
Called and left voicemail at [phone].

## 2012-12-31 ENCOUNTER — Encounter (INDEPENDENT_AMBULATORY_CARE_PROVIDER_SITE_OTHER): Payer: Self-pay | Admitting: Internal Medicine

## 2012-12-31 ENCOUNTER — Ambulatory Visit (INDEPENDENT_AMBULATORY_CARE_PROVIDER_SITE_OTHER): Payer: 59 | Admitting: Internal Medicine

## 2012-12-31 ENCOUNTER — Telehealth (INDEPENDENT_AMBULATORY_CARE_PROVIDER_SITE_OTHER): Payer: Self-pay | Admitting: Internal Medicine

## 2012-12-31 VITALS — BP 104/68 | HR 72 | Temp 98.1°F | Ht 64.0 in | Wt 198.5 lb

## 2012-12-31 DIAGNOSIS — R933 Abnormal findings on diagnostic imaging of other parts of digestive tract: Secondary | ICD-10-CM

## 2012-12-31 NOTE — Patient Instructions (Addendum)
OV one year. Any problems call our office.

## 2012-12-31 NOTE — Progress Notes (Signed)
Subjective:     Patient ID: Amy Dean, female   DOB: Dec 08, 1965, 47 y.o.   MRN: [id number]  HPI Here today for a scheduled visit.  She was last seen in August of 2013. She tells me she is doing good. There is no abdominal pain. There is no nausea or vomiting. She tells me her BMs are normal. No melena or bright red rectal bleeding.  There is no diarrhea.  She occasionally has acid reflux and takes Protonix which usually controls it. No fever or chills.  She is not taking any pain medication.  No Goody Powders. See last year for upper abdominal pain.  She underwent a CT which revealed focal changes of enteritis and prominent  Retroperitoneal lymph nodes. Upper GI series with small bowel follow thru was normal. She underwent an EUS with FNA by Dr. Mardene Speak. She had prominent peri[ancreatic and periportal lymph nodes.  FNA was negative for neoplastic process.  ACE level was also checked and was low.  Hepatic Function Panel     Component Value Date/Time   PROT 6.6 09/19/2012 1600   ALBUMIN 3.9 09/19/2012 1600   AST 26 09/19/2012 1600   ALT 21 09/19/2012 1600   ALKPHOS 73 09/19/2012 1600   BILITOT 0.6 09/19/2012 1600   BILIDIR 0.1 09/19/2012 1600   IBILI 0.5 09/19/2012 1600      Review of Systems see hpi Current Outpatient Prescriptions  Medication Sig Dispense Refill  . acetaminophen (TYLENOL) 500 MG tablet Take 500 mg by mouth as needed.      Marland Kitchen lisinopril-hydrochlorothiazide (PRINZIDE,ZESTORETIC) 20-12.5 MG per tablet Take 1 tablet by mouth daily.  90 tablet  1  . metFORMIN (GLUCOPHAGE) 500 MG tablet Take 2 tablets (1,000 mg total) by mouth 2 (two) times daily with a meal.  180 tablet  1  . Pantoprazole Sodium (PROTONIX PO) Take 40 mg by mouth. daily      . pravastatin (PRAVACHOL) 20 MG tablet TAKE 1 TABLET BY MOUTH DAILY  90 tablet  1   No current facility-administered medications for this visit.       No Known Allergies      Objective:   Physical Exam  Filed Vitals:   12/31/12 1615  BP: 104/68  Pulse: 72  Temp: 98.1 F (36.7 C)  Height: 5\' 4"  (1.626 m)  Weight: 198 lb 8 oz (90.039 kg)   Alert and oriented. Skin warm and dry. Oral mucosa is moist.   . Sclera anicteric, conjunctivae is pink. Thyroid not enlarged. No cervical lymphadenopathy. Lungs clear. Heart regular rate and rhythm.  Abdomen is soft. Bowel sounds are positive. No hepatomegaly. No abdominal masses felt. No tenderness.  No edema to lower extremities. Patient is alert and oriented.     Assessment:  No further abdominal pain since her last visit her. EUS in July of 2013 was negative for a neoplastic or granulomatous process.  She asymptomatic at this time. Looking thru Epic Dr. Christella Hartigan office called patient to scheudule EUS but it does not appear patient responded. I discussed this case with Dr. Karilyn Cota.    Plan:    May follow up in one year. If any problems please call our office. CT abdomen/pelvis with CM.  Further recommendations to follow.

## 2013-01-01 NOTE — Telephone Encounter (Signed)
Error opening  

## 2013-01-02 NOTE — Telephone Encounter (Signed)
Have not heard from pt within 72 hours. Will close out. Notified RNCM that pt did not follow through with scheduling appointment.

## 2013-01-08 ENCOUNTER — Telehealth (INDEPENDENT_AMBULATORY_CARE_PROVIDER_SITE_OTHER): Payer: Self-pay | Admitting: Internal Medicine

## 2013-01-08 DIAGNOSIS — R599 Enlarged lymph nodes, unspecified: Secondary | ICD-10-CM

## 2013-01-08 DIAGNOSIS — R591 Generalized enlarged lymph nodes: Secondary | ICD-10-CM

## 2013-01-08 NOTE — Telephone Encounter (Signed)
I spoke with patient and she is not pregnanct

## 2013-01-08 NOTE — Telephone Encounter (Signed)
[name],  I have spoken with patient. This is a follow up CT.   [name] needs a CT abdomen/pelvis with CM.  INDICATIONS: recent abd pain led to [place] and then CT; pain has  improved but not normal yet. CT showed scattered retroperitoneal  adenopathy, peripancreatic adenopathy. No pancreatic masses.

## 2013-01-08 NOTE — Telephone Encounter (Signed)
I need you to put order in for CT before I can schedule

## 2013-01-08 NOTE — Telephone Encounter (Signed)
CT sch'd 01/14/13 at 445 (430) & pick up contrast, patient aware

## 2013-01-14 ENCOUNTER — Ambulatory Visit (HOSPITAL_COMMUNITY): Admission: RE | Admit: 2013-01-14 | Payer: 59 | Source: Ambulatory Visit

## 2013-01-15 ENCOUNTER — Telehealth (INDEPENDENT_AMBULATORY_CARE_PROVIDER_SITE_OTHER): Payer: Self-pay | Admitting: *Deleted

## 2013-01-15 NOTE — Telephone Encounter (Signed)
Patient called to let you know she canceled her CT because she needed to bring $1000.00 for her portion and she doesn't have that and she isn't in a place where she can make payment arrangements

## 2013-01-16 NOTE — Telephone Encounter (Signed)
Noted  

## 2013-01-20 NOTE — Telephone Encounter (Signed)
Noted  

## 2013-01-29 ENCOUNTER — Other Ambulatory Visit: Payer: Self-pay | Admitting: Family Medicine

## 2013-01-29 DIAGNOSIS — Z139 Encounter for screening, unspecified: Secondary | ICD-10-CM

## 2013-01-31 NOTE — Telephone Encounter (Signed)
Am going to try on Dexilant. Patients says she can afford to have a HIDA scan at this time. She has a $5500 deductible.

## 2013-02-21 ENCOUNTER — Other Ambulatory Visit: Payer: Self-pay | Admitting: Family Medicine

## 2013-03-06 ENCOUNTER — Ambulatory Visit (HOSPITAL_COMMUNITY)
Admission: RE | Admit: 2013-03-06 | Discharge: 2013-03-06 | Disposition: A | Discharge status: Home / Self Care | Payer: 59 | Source: Ambulatory Visit | Attending: Family Medicine | Admitting: Family Medicine

## 2013-03-06 DIAGNOSIS — Z1231 Encounter for screening mammogram for malignant neoplasm of breast: Secondary | ICD-10-CM | POA: Insufficient documentation

## 2013-03-06 DIAGNOSIS — Z139 Encounter for screening, unspecified: Secondary | ICD-10-CM

## 2013-03-10 ENCOUNTER — Ambulatory Visit (INDEPENDENT_AMBULATORY_CARE_PROVIDER_SITE_OTHER): Payer: Self-pay | Admitting: Family Medicine

## 2013-03-10 ENCOUNTER — Other Ambulatory Visit: Payer: Self-pay | Admitting: Family Medicine

## 2013-03-10 VITALS — BP 138/82 | HR 90 | Ht 64.0 in | Wt 202.0 lb

## 2013-03-10 DIAGNOSIS — E119 Type 2 diabetes mellitus without complications: Secondary | ICD-10-CM

## 2013-03-10 NOTE — Progress Notes (Signed)
Subjective:  Patient presents today for 3 month diabetes follow-up as part of the employer-sponsored Link to Wellness program. Today is her annual pharmacy visit. Medication list was reviewed and updated. Current diabetes regimen includes metformin 500 mg 2 tablets BID. Patient also continues on daily ACEi, and statin.   Patient reports that things have been going pretty good. Last appointment with Dr. Gerda Diss was over the summer. She does not have one pending. No other medication changes since her last appointment. She is here for her annual pharmacy visit.  Weight is up 10 pounds since her last appointment with Marylu Lund. She has not had an A1C checked since her last visit, so she asked that we check it today.   She reports that she completed a visit with an opthalmologist and "everything was fine". She reports that she does not have to go back to see him for 3 years.   Patient was in a hurry to get back to work so our visit was quite abbreviated.     Disease Assessments:  Diabetes:  Type of Diabetes: Type 2; Sees Diabetes provider 3 times per year; MD managing Diabetes Dr. Lilyan Punt; Current Diabetes related medical conditions are High blood pressure, High cholesterol; checks feet daily; uses glucometer; takes an aspirin a day; Year of diagnosis 2012; Diabetes Education will refer to RD at [facility] for DM classes; checks blood glucose 1-2 times a week;   Highest CBG 138; Lowest CBG 150; hypoglycemia frequency never; takes medications as prescribed Adherentreprots no missed doses of medications;   Other Diabetes History: Patient did not bring her CBG meter with her to this appointment. Highs and lows are patient reported.   States she is checking 2-3 times a month. She states that the highest she has is 150, lowest 138. When she does check it is fasting. She denies hypoglycemia.      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 When she leaves work she goes to check in on him and  then heads home. She voices that she thinks exercise would help her to lose weight and to improve blood glucose but she doesn't have time to work on it right now. She states that she has never been in the habit of regular physical activity.   Nutrition-  Did not have time to complete a 24 hour recall. Patient did admit that she has gone back to drinking [facility], usually a 20 ounce bottle once daily. She thinks this is the reason that she has gained weight since her last appointment with Marylu Lund.      Preventive Care:    Hemoglobin A1c: 03/10/2013 6.3    Dilated Eye Exam: 12/20/2012  Flu vaccine: 01/08/2013  Foot Exam: 09/19/2012  Other Preventive Care Notes: Dental Visit- Last year. None pending for this year.       Vital Signs:  03/10/2013 9:16 AM (EST) Blood Pressure 138 / 82 mm/HgBMI 34.7; Height 5 ft 4 in; Weight 202 lbs    Testing:  Blood Sugar Tests:  Hemoglobin A1c: 6.6 resulted on 03/10/2013    Assessment/Plan: Patient is a 47 year old female with DM2. A1C today was 6.6% which is meeting goal of less than 7%. Patient's weight has increased almost 10 pounds since her last appointment with Marylu Lund. Patient attributes weight gain to increased sweetened beverage consumption. She is drinking 20-30 ounces of [facility] each day. She also does not complete any physical activity outside of work.   Discussed with patient  limiting mountain dew consumption and switching to water or crystal light. I also encouraged patient to start physical activity.   I will fax A1C results to Dr. Fletcher Anon office. Marylu Lund will follow up with patient in 3 months..   Goals for Next Visit-  1. Make an appointment to see the dentist. 2. Cut back on [facility] to once a week. Drink water or Crystal Light.  3. Think about physical activity. See if you can get a walk in the evening.  Marylu Lund will contact you about setting up your next appointment.   Acie Custis D. Vivia Ewing, PharmD, BCPS

## 2013-03-17 ENCOUNTER — Telehealth: Payer: Self-pay | Admitting: Family Medicine

## 2013-03-17 NOTE — Telephone Encounter (Signed)
Review sheet from [facility] pharmacy.

## 2013-03-17 NOTE — Telephone Encounter (Signed)
It is noted that the patient's A1c through home health monitoring was 6.6 which is good. This patient still needs a six-month followup with [place]. Please send the patient a card encouraging her to schedule a six-month followup office visit

## 2013-03-18 NOTE — Telephone Encounter (Signed)
Card sent 

## 2013-03-20 ENCOUNTER — Telehealth: Payer: Self-pay | Admitting: Family Medicine

## 2013-03-20 NOTE — Telephone Encounter (Signed)
See result of Link to Wellness attached to chart

## 2013-03-25 NOTE — Telephone Encounter (Signed)
Card mailed to patient to schedule office visit.

## 2013-03-25 NOTE — Telephone Encounter (Signed)
A1C 6.6 via health nurse, send pt card she needs diabetic check up with me ( last one in May, we need to see at least q6 months if we are going to be the responsible doctor of record.I appreciate the nurse visits and Pharm D visits but I still need to see pt to take care of her. Visits with health nurse/pharm D do NOT substitute for our visits.Nurses, send pt reminder to f/u ov by end of year.

## 2013-03-31 ENCOUNTER — Other Ambulatory Visit: Payer: Self-pay | Admitting: Family Medicine

## 2013-03-31 MED ORDER — LISINOPRIL-HYDROCHLOROTHIAZIDE 20-12.5 MG PO TABS: 1.0000 | ORAL_TABLET | Freq: Every day | ORAL | Status: DC

## 2013-03-31 NOTE — Telephone Encounter (Signed)
Rx sent electronically to pharmacy. Patient notified. 

## 2013-03-31 NOTE — Telephone Encounter (Signed)
Patient has a followup appointment on 04/16/13 and is wanting to know if we can call in another Rx of lisinopril until that appointment.  [facility]

## 2013-04-16 ENCOUNTER — Encounter: Payer: Self-pay | Admitting: Nurse Practitioner

## 2013-04-16 ENCOUNTER — Ambulatory Visit (INDEPENDENT_AMBULATORY_CARE_PROVIDER_SITE_OTHER): Payer: 59 | Admitting: Nurse Practitioner

## 2013-04-16 VITALS — BP 150/98 | Ht 64.0 in | Wt 201.0 lb

## 2013-04-16 DIAGNOSIS — R252 Cramp and spasm: Secondary | ICD-10-CM

## 2013-04-16 DIAGNOSIS — I10 Essential (primary) hypertension: Secondary | ICD-10-CM

## 2013-04-16 DIAGNOSIS — R5381 Other malaise: Secondary | ICD-10-CM

## 2013-04-16 DIAGNOSIS — E785 Hyperlipidemia, unspecified: Secondary | ICD-10-CM

## 2013-04-16 DIAGNOSIS — K219 Gastro-esophageal reflux disease without esophagitis: Secondary | ICD-10-CM

## 2013-04-16 DIAGNOSIS — Z79899 Other long term (current) drug therapy: Secondary | ICD-10-CM

## 2013-04-16 MED ORDER — PANTOPRAZOLE SODIUM 40 MG PO TBEC: 40.0000 mg | DELAYED_RELEASE_TABLET | Freq: Every day | ORAL | Status: DC

## 2013-04-16 MED ORDER — PRAVASTATIN SODIUM 20 MG PO TABS: ORAL_TABLET | ORAL | Status: DC

## 2013-04-16 MED ORDER — LISINOPRIL-HYDROCHLOROTHIAZIDE 20-12.5 MG PO TABS: 1.0000 | ORAL_TABLET | Freq: Every day | ORAL | Status: DC

## 2013-04-16 MED ORDER — METFORMIN HCL 1000 MG PO TABS: 1000.0000 mg | ORAL_TABLET | Freq: Two times a day (BID) | ORAL | Status: DC

## 2013-04-17 ENCOUNTER — Encounter: Payer: Self-pay | Admitting: Nurse Practitioner

## 2013-04-17 NOTE — Progress Notes (Signed)
Subjective:  Presents for routine followup. Insurance provides diabetes checkup every 3 months, her hemoglobin A1c was 6.7 in November. They do not prescribe her medication. No chest pain shortness of breath or edema. Reflux stable on Protonix, now using it when necessary. Having some muscle cramps off and on particularly in the upper legs. Denies any specific activity that may have caused problems. Gets regular eye exams. Has had her flu vaccine.  Objective:   BP 150/98  Ht 5\' 4"  (1.626 m)  Wt 201 lb (91.173 kg)  BMI 34.48 kg/m2  LMP 02/10/2011 NAD. Alert, oriented. Lungs clear. Heart regular rate rhythm. Lower extremities no edema. No tenderness with palpation of the thigh muscles. Abdomen soft nondistended nontender.  Assessment:HYPERTENSION  High risk medication use - Plan: Hepatic function panel  Hyperlipemia - Plan: Lipid panel  Other malaise and fatigue - Plan: TSH  Muscle cramp - Plan: Potassium, Magnesium  GERD (gastroesophageal reflux disease)  Meds ordered this encounter  Medications  . lisinopril-hydrochlorothiazide (PRINZIDE,ZESTORETIC) 20-12.5 MG per tablet    Sig: Take 1 tablet by mouth daily. For BP    Dispense:  90 tablet    Refill:  1    Order Specific Question:  Supervising Provider    Answer:  Merlyn Albert [[id number]]  . pantoprazole (PROTONIX) 40 MG tablet    Sig: Take 1 tablet (40 mg total) by mouth daily. For acid reflux    Dispense:  90 tablet    Refill:  1    Order Specific Question:  Supervising Provider    Answer:  Merlyn Albert [[id number]]  . pravastatin (PRAVACHOL) 20 MG tablet    Sig: TAKE 1 TABLET BY MOUTH DAILY FOR CHOLESTEROL    Dispense:  90 tablet    Refill:  1    Order Specific Question:  Supervising Provider    Answer:  Merlyn Albert [[id number]]  . metFORMIN (GLUCOPHAGE) 1000 MG tablet    Sig: Take 1 tablet (1,000 mg total) by mouth 2 (two) times daily with a meal.    Dispense:  180 tablet    Refill:  1    Order Specific Question:   Supervising Provider    Answer:  Merlyn Albert [[id number]]   Continue activity healthy diet and weight loss efforts. Recheck in 3-4 months, call back sooner if any problems.

## 2013-04-19 LAB — LIPID PANEL
Cholesterol: 166 mg/dL (ref 0–200)
HDL: 30 mg/dL — ABNORMAL LOW (ref >39)
LDL Cholesterol: 82 mg/dL (ref 0–99)
Total CHOL/HDL Ratio: 5.5 Ratio
Triglycerides: 272 mg/dL — ABNORMAL HIGH (ref <150)
VLDL: 54 mg/dL — ABNORMAL HIGH (ref 0–40)

## 2013-04-19 LAB — MAGNESIUM: Magnesium: 1.8 mg/dL (ref 1.5–2.5)

## 2013-04-19 LAB — HEPATIC FUNCTION PANEL
ALT: 20 U/L (ref 0–35)
AST: 22 U/L (ref 0–37)
Albumin: 4.4 g/dL (ref 3.5–5.2)
Alkaline Phosphatase: 56 U/L (ref 39–117)
Bilirubin, Direct: 0.1 mg/dL (ref 0.0–0.3)
Indirect Bilirubin: 0.4 mg/dL (ref 0.0–0.9)
Total Bilirubin: 0.5 mg/dL (ref 0.3–1.2)
Total Protein: 7.3 g/dL (ref 6.0–8.3)

## 2013-04-19 LAB — POTASSIUM: Potassium: 4.3 mEq/L (ref 3.5–5.3)

## 2013-04-19 LAB — TSH: TSH: 0.928 u[IU]/mL (ref 0.350–4.500)

## 2013-07-15 NOTE — Progress Notes (Signed)
Patient ID: Amy Dean, female   DOB: [birth year], 48 y.o.   MRN: [id number] ATTENDING PHYSICIAN NOTE: I have reviewed the chart and agree with the plan as detailed above. Dorcas Mcmurray MD Pager [phone]

## 2013-07-16 ENCOUNTER — Ambulatory Visit (INDEPENDENT_AMBULATORY_CARE_PROVIDER_SITE_OTHER): Payer: 59 | Admitting: Nurse Practitioner

## 2013-07-16 ENCOUNTER — Encounter: Payer: Self-pay | Admitting: Nurse Practitioner

## 2013-07-16 VITALS — BP 128/90 | Ht 64.0 in | Wt 196.0 lb

## 2013-07-16 DIAGNOSIS — E119 Type 2 diabetes mellitus without complications: Secondary | ICD-10-CM

## 2013-07-16 MED ORDER — LIRAGLUTIDE 18 MG/3ML ~~LOC~~ SOPN: PEN_INJECTOR | SUBCUTANEOUS | Status: DC

## 2013-07-16 MED ORDER — "PEN NEEDLES 5/16"" 31G X 8 MM MISC": Status: AC

## 2013-07-16 NOTE — Patient Instructions (Addendum)
Gastric bypass Ventral gastric sleeve  [facility]

## 2013-07-17 ENCOUNTER — Encounter: Payer: Self-pay | Admitting: Nurse Practitioner

## 2013-07-17 NOTE — Progress Notes (Signed)
Subjective:  Presents for recheck of her diabetes. Last Thursday 3/5 patient began running very high blood sugars in the 300-400 range. Slowly over the past few days numbers have greatly improved. Today her sugars fasting low 100s the highest around 200. Before this began patient was averaging 130-150. No fever or illness. No new medications. No excessive stress. No change in her diet. Does not see a diabetic specialist, sees providers in her wellness program at work. Initially had some headaches and polyuria with high blood sugars, this has improved. ROS otherwise negative. No family history of thyroid cancer.  Objective:   BP 128/90  Ht 5\' 4"  (1.626 m)  Wt 196 lb (88.905 kg)  BMI 33.63 kg/m2  LMP 02/10/2011 NAD. Alert, oriented. TMs minimal clear effusion, no erythema. Pharynx clear. Neck supple with mild soft anterior adenopathy. Lungs clear. Heart regular rate rhythm.  Assessment: Problem List Items Addressed This Visit     Endocrine   Diabetes mellitus type II, controlled - Primary   Relevant Medications      Liraglutide 18 MG/3ML SOPN     Other   Morbid obesity     Plan: Meds ordered this encounter  Medications  . Liraglutide 18 MG/3ML SOPN    Sig: 0.6 mg SQ qd x 1 week then if tolerated increase to 1.2 mg SQ qd    Dispense:  2 pen    Refill:  2    Order Specific Question:  Supervising Provider    Answer:  Mikey Kirschner [[id number]]  . Insulin Pen Needle (PEN NEEDLES 31GX5/16") 31G X 8 MM MISC    Sig: Use daily as directed    Dispense:  100 each    Refill:  11    Order Specific Question:  Supervising Provider    Answer:  Mikey Kirschner [[id number]]   Cannot readily identify the cause of her spike in blood sugars. Patient to call back if it happens again. For now blood sugars are approaching her baseline prior to last week. Add Victoza as directed. Reviewed potential adverse effects. Reviewed appropriate sites for injection. Followup in May as planned, call back sooner if any  problems. Will plan yearly lab work including hemoglobin A1c at that time.

## 2013-08-20 ENCOUNTER — Encounter: Payer: Self-pay | Admitting: Nurse Practitioner

## 2013-08-20 ENCOUNTER — Ambulatory Visit (INDEPENDENT_AMBULATORY_CARE_PROVIDER_SITE_OTHER): Payer: 59 | Admitting: Nurse Practitioner

## 2013-08-20 VITALS — BP 148/92 | Temp 97.7°F | Ht 64.0 in | Wt 193.0 lb

## 2013-08-20 DIAGNOSIS — J310 Chronic rhinitis: Secondary | ICD-10-CM

## 2013-08-20 DIAGNOSIS — J329 Chronic sinusitis, unspecified: Secondary | ICD-10-CM

## 2013-08-20 MED ORDER — FEXOFENADINE HCL 180 MG PO TABS: 180.0000 mg | ORAL_TABLET | Freq: Every day | ORAL | Status: DC

## 2013-08-20 MED ORDER — AZITHROMYCIN 250 MG PO TABS: ORAL_TABLET | ORAL | Status: DC

## 2013-08-20 MED ORDER — FLUTICASONE PROPIONATE 50 MCG/ACT NA SUSP: 2.0000 | Freq: Every day | NASAL | Status: DC

## 2013-08-20 NOTE — Patient Instructions (Signed)
zaditor eye drops as directed

## 2013-08-22 ENCOUNTER — Encounter: Payer: Self-pay | Admitting: Nurse Practitioner

## 2013-08-22 NOTE — Progress Notes (Signed)
Subjective:  Presents with complaints of sore throat and cough that began 4 days ago. No fever. Frontal area headache. Cough producing yellow sputum. Head congestion. No wheezing. Sore throat mainly with cough. No ear pain.   Objective:   BP 148/92  Temp(Src) 97.7 F (36.5 C)  Ht 5\' 4"  (1.626 m)  Wt 193 lb (87.544 kg)  BMI 33.11 kg/m2  LMP 02/10/2011 NAD. Alert, oriented. TMs clear effusion, no erythema. Pharynx mildly injected with PND noted. Neck supple with mild adenopathy. Lungs clear. Heart RRR.   Assessment: Rhinosinusitis  Plan:  Meds ordered this encounter  Medications  . azithromycin (ZITHROMAX Z-PAK) 250 MG tablet    Sig: Take 2 tablets (500 mg) on  Day 1,  followed by 1 tablet (250 mg) once daily on Days 2 through 5.    Dispense:  6 each    Refill:  0    Order Specific Question:  Supervising Provider    Answer:  Mikey Kirschner [[id number]]  . fluticasone (FLONASE) 50 MCG/ACT nasal spray    Sig: Place 2 sprays into both nostrils daily.    Dispense:  48 g    Refill:  11    Order Specific Question:  Supervising Provider    Answer:  Mikey Kirschner [[id number]]  . fexofenadine (ALLEGRA) 180 MG tablet    Sig: Take 1 tablet (180 mg total) by mouth daily.    Dispense:  90 tablet    Refill:  1    Order Specific Question:  Supervising Provider    Answer:  Mikey Kirschner [[id number]]   Call back if worsens or persists.

## 2013-10-01 ENCOUNTER — Encounter (INDEPENDENT_AMBULATORY_CARE_PROVIDER_SITE_OTHER): Payer: Self-pay | Admitting: *Deleted

## 2013-10-05 ENCOUNTER — Ambulatory Visit: Payer: 59

## 2013-10-05 ENCOUNTER — Ambulatory Visit (INDEPENDENT_AMBULATORY_CARE_PROVIDER_SITE_OTHER): Payer: 59 | Admitting: Family Medicine

## 2013-10-05 VITALS — BP 136/88 | HR 80 | Temp 97.3°F | Resp 24 | Ht 63.27 in | Wt 192.4 lb

## 2013-10-05 DIAGNOSIS — M545 Low back pain, unspecified: Secondary | ICD-10-CM

## 2013-10-05 DIAGNOSIS — R10A0 Flank pain, unspecified side: Secondary | ICD-10-CM

## 2013-10-05 DIAGNOSIS — R109 Unspecified abdominal pain: Secondary | ICD-10-CM

## 2013-10-05 LAB — POCT URINALYSIS DIPSTICK
Bilirubin, UA: NEGATIVE
Blood, UA: NEGATIVE
Glucose, UA: NEGATIVE
Ketones, UA: NEGATIVE
Leukocytes, UA: NEGATIVE
Nitrite, UA: NEGATIVE
Protein, UA: NEGATIVE
Spec Grav, UA: 1.02
Urobilinogen, UA: 0.2
pH, UA: 5.5

## 2013-10-05 LAB — POCT CBC
Granulocyte percent: 46 %G (ref 37–80)
HCT, POC: 41.8 % (ref 37.7–47.9)
Hemoglobin: 13.5 g/dL (ref 12.2–16.2)
Lymph, poc: 2.1 (ref 0.6–3.4)
MCH, POC: 29.9 pg (ref 27–31.2)
MCHC: 32.3 g/dL (ref 31.8–35.4)
MCV: 92.4 fL (ref 80–97)
MID (cbc): 0.4 (ref 0–0.9)
MPV: 8.7 fL (ref 0–99.8)
POC Granulocyte: 2.2 (ref 2–6.9)
POC LYMPH PERCENT: 45.6 %L (ref 10–50)
POC MID %: 8.4 %M (ref 0–12)
Platelet Count, POC: 223 10*3/uL (ref 142–424)
RBC: 4.52 M/uL (ref 4.04–5.48)
RDW, POC: 13.4 %
WBC: 4.7 10*3/uL (ref 4.6–10.2)

## 2013-10-05 LAB — POCT UA - MICROSCOPIC ONLY
Bacteria, U Microscopic: NEGATIVE
Casts, Ur, LPF, POC: NEGATIVE
Crystals, Ur, HPF, POC: NEGATIVE
Mucus, UA: NEGATIVE
RBC, urine, microscopic: NEGATIVE
WBC, Ur, HPF, POC: NEGATIVE
Yeast, UA: NEGATIVE

## 2013-10-05 MED ORDER — CYCLOBENZAPRINE HCL 10 MG PO TABS: 10.0000 mg | ORAL_TABLET | Freq: Three times a day (TID) | ORAL | Status: DC | PRN

## 2013-10-05 MED ORDER — MELOXICAM 15 MG PO TABS: 15.0000 mg | ORAL_TABLET | Freq: Every day | ORAL | Status: DC

## 2013-10-05 NOTE — Patient Instructions (Signed)
Get plenty of rest and drink at least 64 ounces of water daily. 

## 2013-10-05 NOTE — Progress Notes (Signed)
Subjective:    Patient ID: Amy Dean, female    DOB: 04/28/1966, 48 y.o.   MRN: [id number]   PCP: Sallee Lange, MD  Chief Complaint  Patient presents with  . Flank Pain    Right SIde-Started this am  . Hip Pain    Right Side-Started this am      Active Ambulatory Problems    Diagnosis Date Noted  . ANEMIA 12/10/2008  . HYPERTENSION 12/10/2008  . DYSPHAGIA UNSPECIFIED 12/10/2008  . Diabetes mellitus type II, controlled 03/23/2011  . Enteritis 10/04/2011  . Nonspecific (abnormal) findings on radiological and other examination of gastrointestinal tract 10/26/2011  . GERD (gastroesophageal reflux disease) 12/18/2011  . Morbid obesity 07/17/2013   Resolved Ambulatory Problems    Diagnosis Date Noted  . Fibroids, intramural 03/21/2011   Past Medical History  Diagnosis Date  . Hypertension   . Diabetes mellitus   . Anemia   . IFG (impaired fasting glucose)   . Dyslipidemia     Past Surgical History  Procedure Laterality Date  . Cesarean section  1982 and 1989    x2  . Tonsillectomy    . Hysteroscopy w/ endometrial ablation  07/22/2008    K.Harrington Challenger MD [facility]  . Tubal ligation    . Supracervical abdominal hysterectomy  03/21/2011    Procedure: HYSTERECTOMY SUPRACERVICAL ABDOMINAL;  Surgeon: Jonnie Kind, MD;  Location: [facility];  Service: Gynecology;;  . Abdominal hysterectomy    . Eus  10/26/2011    Procedure: UPPER ENDOSCOPIC ULTRASOUND (EUS) LINEAR;  Surgeon: Milus Banister, MD;  Location: [facility] ENDOS[facility];  Service: Endoscopy;  Laterality: N/A;    No Known Allergies  Prior to Admission medications   Medication Sig Start Date End Date Taking? Authorizing Provider  acetaminophen (TYLENOL) 500 MG tablet Take 500 mg by mouth as needed.   Yes Historical Provider, MD  fexofenadine (ALLEGRA) 180 MG tablet Take 1 tablet (180 mg total) by mouth daily. 08/20/13  Yes Nilda Simmer, NP  fluticasone (FLONASE) 50 MCG/ACT nasal spray Place 2 sprays into both nostrils  daily. 08/20/13  Yes Nilda Simmer, NP  Insulin Pen Needle (PEN NEEDLES 31GX5/16") 31G X 8 MM MISC Use daily as directed 07/16/13  Yes Nilda Simmer, NP  Liraglutide 18 MG/3ML SOPN 0.6 mg SQ qd x 1 week then if tolerated increase to 1.2 mg SQ qd 07/16/13  Yes Nilda Simmer, NP  lisinopril-hydrochlorothiazide (PRINZIDE,ZESTORETIC) 20-12.5 MG per tablet Take 1 tablet by mouth daily. For BP 04/16/13  Yes Nilda Simmer, NP  metFORMIN (GLUCOPHAGE) 1000 MG tablet Take 1 tablet (1,000 mg total) by mouth 2 (two) times daily with a meal. 04/16/13  Yes Nilda Simmer, NP  ONE TOUCH ULTRA TEST test strip USE TWICE DAILY AS DIRECTED 03/10/13  Yes Kathyrn Drown, MD  pantoprazole (PROTONIX) 40 MG tablet Take 1 tablet (40 mg total) by mouth daily. For acid reflux 04/16/13  Yes Nilda Simmer, NP  pravastatin (PRAVACHOL) 20 MG tablet TAKE 1 TABLET BY MOUTH DAILY FOR CHOLESTEROL 04/16/13  Yes Nilda Simmer, NP    History   Social History  . Marital Status: Married    Spouse Name: Tyan, Lasure.    Number of Children: 1  . Years of Education: 12th grade   Occupational History  . [employer]    Social History Main Topics  . Smoking status: Never Smoker   . Smokeless tobacco: Never Used  . Alcohol Use: Yes  Comment: occastionally on holidays; 2 drinks at a time  . Drug Use: No  . Sexual Activity: None   Other Topics Concern  . None   Social History Narrative   Lives with her husband and their son.    family history includes Healthy in her son; Heart attack in her paternal uncle; Hypertension in her maternal grandmother; Sarcoidosis in her mother. There is no history of Anesthesia problems, Hypotension, Malignant hyperthermia, or Pseudochol deficiency. indicated that her mother is deceased. She indicated that her father is deceased. She indicated that her sister is alive. She indicated that her son is alive.   HPI  This morning, while standing in the  kitchen, developed pain in the RIGHT low back/flank.  It has become progressively worse and now extends through the buttocks down the RIGHT leg tot he knee. SOme mild urinary frequency, significant urinary urgency.  No dysuria or hematuria.  Some nausea.  No vomiting. No diarrhea or constipation. No fever, chills.  No recent unusual activities.  No back trauma/ injury.   Review of Systems As above.    Objective:   Physical Exam  Vitals reviewed. Constitutional: She is oriented to person, place, and time. She appears well-developed and well-nourished. She is active. She appears distressed (appears uncomfortable, sitting leaned over on the LEFT side.).  BP 136/88  Pulse 80  Temp(Src) 97.3 F (36.3 C) (Oral)  Resp 24  Ht 5' 3.27" (1.607 m)  Wt 192 lb 6 oz (87.261 kg)  BMI 33.79 kg/m2  SpO2 98%  LMP 02/10/2011   Eyes: Conjunctivae are normal. No scleral icterus.  Cardiovascular: Normal rate, regular rhythm and normal heart sounds.   Pulmonary/Chest: Effort normal and breath sounds normal.  Abdominal: Soft. Bowel sounds are normal. She exhibits no distension and no mass. There is no hepatosplenomegaly. There is tenderness in the right upper quadrant and right lower quadrant. There is no rigidity, no rebound and no guarding. No hernia.  Musculoskeletal:       Thoracic back: Normal.       Lumbar back: She exhibits decreased range of motion, tenderness, pain and spasm. She exhibits no bony tenderness, no swelling and no edema.       Back:       Right upper leg: She exhibits no tenderness and no bony tenderness.       Legs: Neurological: She is alert and oriented to person, place, and time. She displays abnormal reflex. She exhibits abnormal muscle tone.  Skin: Skin is warm and dry.  Psychiatric: She has a normal mood and affect. Her behavior is normal.   LS Spine: [facility] reading (PRIMARY) by  Dr. Tamala Julian.  No acute findings.  Results for orders placed in visit on 10/05/13  POCT CBC       Result Value Ref Range   WBC 4.7  4.6 - 10.2 K/uL   Lymph, poc 2.1  0.6 - 3.4   POC LYMPH PERCENT 45.6  10 - 50 %L   MID (cbc) 0.4  0 - 0.9   POC MID % 8.4  0 - 12 %M   POC Granulocyte 2.2  2 - 6.9   Granulocyte percent 46.0  37 - 80 %G   RBC 4.52  4.04 - 5.48 M/uL   Hemoglobin 13.5  12.2 - 16.2 g/dL   HCT, POC 41.8  37.7 - 47.9 %   MCV 92.4  80 - 97 fL   MCH, POC 29.9  27 - 31.2 pg   MCHC  32.3  31.8 - 35.4 g/dL   RDW, POC 13.4     Platelet Count, POC 223  142 - 424 K/uL   MPV 8.7  0 - 99.8 fL  POCT UA - MICROSCOPIC ONLY      Result Value Ref Range   WBC, Ur, HPF, POC neg     RBC, urine, microscopic neg     Bacteria, U Microscopic neg     Mucus, UA neg     Epithelial cells, urine per micros 0-3     Crystals, Ur, HPF, POC neg     Casts, Ur, LPF, POC neg     Yeast, UA neg    POCT URINALYSIS DIPSTICK      Result Value Ref Range   Color, UA yellow     Clarity, UA clear     Glucose, UA neg     Bilirubin, UA neg     Ketones, UA neg     Spec Grav, UA 1.020     Blood, UA neg     pH, UA 5.5     Protein, UA neg     Urobilinogen, UA 0.2     Nitrite, UA neg     Leukocytes, UA Negative            Assessment & Plan:  1. Flank pain 2. Low back pain Suspect musculoskeletal strain, though flank/abdominal pain is unusual. No evidence of nephrolithiasis. Would not expect pyelonephritis.  Anticipatory guidance.  Supportive care. - POCT CBC - POCT UA - Microscopic Only - POCT urinalysis dipstick - DG Lumbar Spine Complete; Future - meloxicam (MOBIC) 15 MG tablet; Take 1 tablet (15 mg total) by mouth daily.  Dispense: 30 tablet; Refill: 0 - cyclobenzaprine (FLEXERIL) 10 MG tablet; Take 1 tablet (10 mg total) by mouth 3 (three) times daily as needed for muscle spasms.  Dispense: 30 tablet; Refill: 0  Return if symptoms worsen or fail to improve.   Fara Chute, PA-C Physician Assistant-Certified [facility]

## 2013-10-20 ENCOUNTER — Other Ambulatory Visit: Payer: Self-pay | Admitting: Nurse Practitioner

## 2013-12-29 ENCOUNTER — Other Ambulatory Visit: Payer: Self-pay | Admitting: Nurse Practitioner

## 2013-12-29 MED ORDER — LIRAGLUTIDE 18 MG/3ML ~~LOC~~ SOPN: PEN_INJECTOR | SUBCUTANEOUS | Status: DC

## 2014-01-05 ENCOUNTER — Ambulatory Visit (INDEPENDENT_AMBULATORY_CARE_PROVIDER_SITE_OTHER): Payer: 59 | Admitting: Nurse Practitioner

## 2014-01-05 ENCOUNTER — Encounter: Payer: Self-pay | Admitting: Nurse Practitioner

## 2014-01-05 VITALS — BP 140/90 | Ht 64.0 in | Wt 192.0 lb

## 2014-01-05 DIAGNOSIS — F418 Other specified anxiety disorders: Secondary | ICD-10-CM | POA: Insufficient documentation

## 2014-01-05 DIAGNOSIS — F341 Dysthymic disorder: Secondary | ICD-10-CM

## 2014-01-05 MED ORDER — ALPRAZOLAM 0.5 MG PO TABS: 0.5000 mg | ORAL_TABLET | Freq: Every evening | ORAL | Status: DC | PRN

## 2014-01-05 MED ORDER — CITALOPRAM HYDROBROMIDE 20 MG PO TABS: 20.0000 mg | ORAL_TABLET | Freq: Every day | ORAL | Status: DC

## 2014-01-07 ENCOUNTER — Encounter: Payer: Self-pay | Admitting: Nurse Practitioner

## 2014-01-07 NOTE — Progress Notes (Signed)
Subjective:  Presents for routine followup. Had her hemoglobin A1c tested on 11/24/2013 from her wellness program at work. Results were 5.7%. FBS this morning 120. No chest pain/ischemic type pain or shortness of breath. Overall doing well with her diet. Has an active job. Patient has been experiencing depression and anxiety lately, her son is moving out and getting married. Although patient is happy about this has conflicting emotions, lost her other son in 2002 due to a car accident. Denies suicidal or homicidal thoughts or ideation. In addition patient is working second shift until the end of November. This has caused some difficulty with sleeping. Complaints of fatigue sleep disturbance social isolation and emotional lability. Married, stable relationship.  Objective:   BP 140/90  Ht 5\' 4"  (1.626 m)  Wt 192 lb (87.091 kg)  BMI 32.94 kg/m2  LMP 02/10/2011 NAD. Alert, oriented. Crying at times during office visit. Lungs clear. Heart regular rate rhythm. Thoughts logical coherent and relevant. Dressed appropriately.  Assessment:  Problem List Items Addressed This Visit     Other   Depression with anxiety - Primary     Plan:  Meds ordered this encounter  Medications  . citalopram (CELEXA) 20 MG tablet    Sig: Take 1 tablet (20 mg total) by mouth daily.    Dispense:  90 tablet    Refill:  0    Order Specific Question:  Supervising Provider    Answer:  Mikey Kirschner [[id number]]  . ALPRAZolam (XANAX) 0.5 MG tablet    Sig: Take 1 tablet (0.5 mg total) by mouth at bedtime as needed for anxiety or sleep.    Dispense:  30 tablet    Refill:  2    Order Specific Question:  Supervising Provider    Answer:  Mikey Kirschner [[id number]]   Review potential adverse effects of Celexa, DC med and call if any problems. Return in about 1 month (around 02/04/2014). Call back sooner if any problems.

## 2014-01-16 ENCOUNTER — Other Ambulatory Visit: Payer: Self-pay | Admitting: Nurse Practitioner

## 2014-01-16 ENCOUNTER — Other Ambulatory Visit: Payer: Self-pay | Admitting: Family Medicine

## 2014-02-04 ENCOUNTER — Ambulatory Visit (INDEPENDENT_AMBULATORY_CARE_PROVIDER_SITE_OTHER): Payer: 59 | Admitting: Nurse Practitioner

## 2014-02-04 ENCOUNTER — Encounter: Payer: Self-pay | Admitting: Nurse Practitioner

## 2014-02-04 VITALS — BP 150/90 | Ht 64.0 in | Wt 191.0 lb

## 2014-02-04 DIAGNOSIS — F418 Other specified anxiety disorders: Secondary | ICD-10-CM

## 2014-02-04 DIAGNOSIS — F341 Dysthymic disorder: Secondary | ICD-10-CM

## 2014-02-04 MED ORDER — CITALOPRAM HYDROBROMIDE 20 MG PO TABS: 20.0000 mg | ORAL_TABLET | Freq: Every day | ORAL | Status: DC

## 2014-02-04 NOTE — Progress Notes (Signed)
Subjective:  Presents for recheck on her anxiety and depression. Celexa working very well. Sleeping much improved. Depression symptoms improved. Continues to have fatigue, working rotating shifts. Work continues to be stressful at this point. Denies any adverse affects from Celexa.  Objective:   BP 150/90  Ht 5\' 4"  (1.626 m)  Wt 191 lb (86.637 kg)  BMI 32.77 kg/m2  LMP 02/10/2011 NAD. Alert, oriented. Cheerful affect. Mildly fatigued in appearance. Lungs clear. Heart regular rate rhythm.  Assessment:  Problem List Items Addressed This Visit     Other   Depression with anxiety - Primary     Plan:  Meds ordered this encounter  Medications  . citalopram (CELEXA) 20 MG tablet    Sig: Take 1 tablet (20 mg total) by mouth daily.    Dispense:  90 tablet    Refill:  1    Order Specific Question:  Supervising Provider    Answer:  Maggie Font   Defers need for counseling this time. Followup at her wellness physical. Call back sooner if any problems.

## 2014-02-18 ENCOUNTER — Other Ambulatory Visit: Payer: Self-pay | Admitting: Family Medicine

## 2014-02-18 DIAGNOSIS — Z139 Encounter for screening, unspecified: Secondary | ICD-10-CM

## 2014-03-02 ENCOUNTER — Ambulatory Visit (INDEPENDENT_AMBULATORY_CARE_PROVIDER_SITE_OTHER): Payer: Self-pay | Admitting: Internal Medicine

## 2014-03-03 ENCOUNTER — Encounter (INDEPENDENT_AMBULATORY_CARE_PROVIDER_SITE_OTHER): Payer: Self-pay | Admitting: Internal Medicine

## 2014-03-03 ENCOUNTER — Ambulatory Visit (INDEPENDENT_AMBULATORY_CARE_PROVIDER_SITE_OTHER): Payer: 59 | Admitting: Internal Medicine

## 2014-03-03 VITALS — BP 134/72 | HR 60 | Temp 97.8°F | Ht 64.0 in | Wt 188.1 lb

## 2014-03-03 DIAGNOSIS — R932 Abnormal findings on diagnostic imaging of liver and biliary tract: Secondary | ICD-10-CM

## 2014-03-03 NOTE — Patient Instructions (Signed)
Recall for CT abdomen/pelvis with CM in 6 months

## 2014-03-03 NOTE — Progress Notes (Signed)
Subjective:    Patient ID: Amy Dean, female    DOB: June 25, 1965, 48 y.o.   MRN: [id number]  HPI Here today for f/u. She was last seen in August of 2014. She tells me she is dong pretty good.  There is no abdominal pain. Appetite is good. No weight loss.  She has a BM once a day. Please see below.  Seen in 2013 for prominent peripancreatic and periportal lymph nodes.. She denies SOB.  She did not follow up having an EUS or repeat CT imaging.     Notes Recorded by Milus Banister, MD on 10/31/2011 at 8:04 AM i spoke with her about the FNA results. No cancer seen, but otherwise non-diagnostic. I had a suspicion for sarcoidosis given her FH (mother has sarcoid) and so ACE level was checked, it was low (argues against sarcoid).   Najeeb, I explained there are two options. 1. Repeat biopsy with either EUS again or other approach (surgical biopsy) or 2. Follow with repeat imaging in 2-3 months. I think I favor repeating the biopsy to give highest chances of not missing something important. I told her I would discuss with you. Let me know what you think.       09/29/2011 CT abdomen/pelvis with CM:   1. Mild peripancreatic and retroperitoneal adenopathy with several  foci of stranding around mesenteric lymph nodes. This type of  stranding can be seen in inflammatory/reactive adenopathy or  malignancy. Enteritis with secondary nodal inflammation could  possibly have this appearance. Follow-up imaging may be warranted  if the process does not follow a self-limited course.     08/05/2011 Upper GI series with small bowel follow thru was normal.   She underwent an EUS with FNA by Dr. Oretha Caprice 10/2011 . She had prominent peripancreatic and periportal lymph nodes. FNA was negative for neoplastic process. ACE level was also checked and was low.    Review of Systems Past Medical History  Diagnosis Date  . Hypertension   . Enteritis   . Diabetes mellitus   . Anemia     resolved  post hysterectomy  . IFG (impaired fasting glucose)   . Dyslipidemia     Past Surgical History  Procedure Laterality Date  . Cesarean section  1982 and 1989    x2  . Tonsillectomy    . Hysteroscopy w/ endometrial ablation  07/22/2008    K.Harrington Challenger MD [facility]  . Tubal ligation    . Supracervical abdominal hysterectomy  03/21/2011    Procedure: HYSTERECTOMY SUPRACERVICAL ABDOMINAL;  Surgeon: Jonnie Kind, MD;  Location: [facility];  Service: Gynecology;;  . Abdominal hysterectomy    . Eus  10/26/2011    Procedure: UPPER ENDOSCOPIC ULTRASOUND (EUS) LINEAR;  Surgeon: Milus Banister, MD;  Location: [facility];  Service: Endoscopy;  Laterality: N/A;    No Known Allergies  Current Outpatient Prescriptions on File Prior to Visit  Medication Sig Dispense Refill  . acetaminophen (TYLENOL) 500 MG tablet Take 500 mg by mouth as needed.      . ALPRAZolam (XANAX) 0.5 MG tablet Take 1 tablet (0.5 mg total) by mouth at bedtime as needed for anxiety or sleep.  30 tablet  2  . citalopram (CELEXA) 20 MG tablet Take 1 tablet (20 mg total) by mouth daily.  90 tablet  1  . cyclobenzaprine (FLEXERIL) 10 MG tablet Take 1 tablet (10 mg total) by mouth 3 (three) times daily as needed for muscle spasms.  30 tablet  0  . fexofenadine (ALLEGRA) 180 MG tablet Take 1 tablet (180 mg total) by mouth daily.  90 tablet  1  . fluticasone (FLONASE) 50 MCG/ACT nasal spray Place 2 sprays into both nostrils daily.  48 g  11  . Insulin Pen Needle (PEN NEEDLES 31GX5/16") 31G X 8 MM MISC Use daily as directed  100 each  11  . Liraglutide 18 MG/3ML SOPN Inject 1.2 mg subcu once daily  2 pen  5  . lisinopril-hydrochlorothiazide (PRINZIDE,ZESTORETIC) 20-12.5 MG per tablet TAKE 1 TABLET BY MOUTH DAILY FOR FOR BLOOD PRESSURE  90 tablet  1  . meloxicam (MOBIC) 15 MG tablet Take 1 tablet (15 mg total) by mouth daily.  30 tablet  0  . metFORMIN (GLUCOPHAGE) 1000 MG tablet TAKE 1 TABLET (1,000 MG TOTAL) BY MOUTH 2 (TWO) TIMES DAILY WITH A  MEAL.  180 tablet  1  . ONE TOUCH ULTRA TEST test strip USE TWICE DAILY AS DIRECTED  200 each  PRN  . pantoprazole (PROTONIX) 40 MG tablet Take 1 tablet (40 mg total) by mouth daily. For acid reflux  90 tablet  1  . pravastatin (PRAVACHOL) 20 MG tablet TAKE 1 TABLET BY MOUTH DAILY FOR CHOLESTEROL  90 tablet  1   No current facility-administered medications on file prior to visit.        Objective:   Physical Exam  Filed Vitals:   03/03/14 1056  BP: 134/72  Pulse: 60  Temp: 97.8 F (36.6 C)  Height: 5\' 4"  (1.626 m)  Weight: 188 lb 1.6 oz (85.322 kg)   Alert and oriented. Skin warm and dry. Oral mucosa is moist.   . Sclera anicteric, conjunctivae is pink. Thyroid not enlarged. No cervical lymphadenopathy. Lungs clear. Heart regular rate and rhythm.  Abdomen is soft. Bowel sounds are positive. No hepatomegaly. No abdominal masses felt. No tenderness.  No edema to lower extremities.          Assessment & Plan:   Assessment:   No further abdominal pain since her last visit her. EUS in July of 2013 was negative for a neoplastic or granulomatous process.   Asymptomatic at this time. She would like to post pone her CT till next year due to finance I discussed this case with Dr. Laural Golden.  Plan:   Will repeat a CT in 6 months.

## 2014-03-09 ENCOUNTER — Ambulatory Visit (HOSPITAL_COMMUNITY)
Admission: RE | Admit: 2014-03-09 | Discharge: 2014-03-09 | Disposition: A | Discharge status: Home / Self Care | Payer: 59 | Source: Ambulatory Visit | Attending: Family Medicine | Admitting: Family Medicine

## 2014-03-09 ENCOUNTER — Encounter (INDEPENDENT_AMBULATORY_CARE_PROVIDER_SITE_OTHER): Payer: Self-pay | Admitting: Internal Medicine

## 2014-03-09 DIAGNOSIS — Z1231 Encounter for screening mammogram for malignant neoplasm of breast: Secondary | ICD-10-CM | POA: Diagnosis present

## 2014-03-09 DIAGNOSIS — Z139 Encounter for screening, unspecified: Secondary | ICD-10-CM

## 2014-03-10 ENCOUNTER — Encounter: Payer: 59 | Admitting: Nurse Practitioner

## 2014-03-12 NOTE — Progress Notes (Signed)
History and physical examinations reviewed with Harrison Mons, PA-C.  Lumbar films reviewed and negative. Agree with assessment and plan.

## 2014-04-01 ENCOUNTER — Ambulatory Visit (INDEPENDENT_AMBULATORY_CARE_PROVIDER_SITE_OTHER): Payer: 59 | Admitting: Nurse Practitioner

## 2014-04-01 ENCOUNTER — Encounter: Payer: Self-pay | Admitting: Nurse Practitioner

## 2014-04-01 VITALS — BP 170/110 | Ht 64.0 in | Wt 188.0 lb

## 2014-04-01 DIAGNOSIS — K219 Gastro-esophageal reflux disease without esophagitis: Secondary | ICD-10-CM

## 2014-04-01 DIAGNOSIS — B3731 Acute candidiasis of vulva and vagina: Secondary | ICD-10-CM

## 2014-04-01 DIAGNOSIS — E1169 Type 2 diabetes mellitus with other specified complication: Secondary | ICD-10-CM | POA: Insufficient documentation

## 2014-04-01 DIAGNOSIS — R5383 Other fatigue: Secondary | ICD-10-CM

## 2014-04-01 DIAGNOSIS — Z0000 Encounter for general adult medical examination without abnormal findings: Secondary | ICD-10-CM

## 2014-04-01 DIAGNOSIS — Z01419 Encounter for gynecological examination (general) (routine) without abnormal findings: Secondary | ICD-10-CM

## 2014-04-01 DIAGNOSIS — E785 Hyperlipidemia, unspecified: Secondary | ICD-10-CM

## 2014-04-01 DIAGNOSIS — B373 Candidiasis of vulva and vagina: Secondary | ICD-10-CM

## 2014-04-01 DIAGNOSIS — Z79899 Other long term (current) drug therapy: Secondary | ICD-10-CM

## 2014-04-01 DIAGNOSIS — E119 Type 2 diabetes mellitus without complications: Secondary | ICD-10-CM

## 2014-04-01 DIAGNOSIS — I10 Essential (primary) hypertension: Secondary | ICD-10-CM

## 2014-04-01 MED ORDER — FLUCONAZOLE 150 MG PO TABS: ORAL_TABLET | ORAL | Status: DC

## 2014-04-05 ENCOUNTER — Encounter: Payer: Self-pay | Admitting: Nurse Practitioner

## 2014-04-05 NOTE — Progress Notes (Addendum)
Subjective:    Patient ID: Amy Dean, female    DOB: 1966/04/04, 48 y.o.   MRN: [id number]  HPI presents for her wellness exam. Overall healthy diet. Regular vision and dental exams. Very active job. Married same sexual partner.     Review of Systems  Constitutional: Negative for fever, activity change, appetite change and fatigue.  HENT: Negative for dental problem, ear pain, sinus pressure and sore throat.   Respiratory: Negative for cough, chest tightness, shortness of breath and wheezing.   Cardiovascular: Negative for chest pain.  Gastrointestinal: Negative for nausea, vomiting, abdominal pain, diarrhea, constipation, blood in stool and abdominal distention.  Genitourinary: Positive for vaginal discharge. Negative for dysuria, urgency, frequency, enuresis, difficulty urinating, genital sores and pelvic pain.       Slight white vaginal discharge with itching and burning.       Objective:   Physical Exam  Constitutional: She is oriented to person, place, and time. She appears well-developed. No distress.  HENT:  Right Ear: External ear normal.  Left Ear: External ear normal.  Mouth/Throat: Oropharynx is clear and moist.  Neck: Normal range of motion. Neck supple. No tracheal deviation present. No thyromegaly present.  Cardiovascular: Normal rate, regular rhythm and normal heart sounds.  Exam reveals no gallop.   No murmur heard. Pulmonary/Chest: Effort normal and breath sounds normal.  Abdominal: Soft. She exhibits no distension. There is no tenderness.  Genitourinary: Vaginal discharge found.  External GU: no rashes or lesions. Vagina: small amount white thick discharge noted. Bimanual exam; no tenderness or masses.   Musculoskeletal: She exhibits no edema.  Lymphadenopathy:    She has no cervical adenopathy.  Neurological: She is alert and oriented to person, place, and time.  Skin: Skin is warm and dry. No rash noted.  Psychiatric: She has a normal mood and  affect. Her behavior is normal.  Vitals reviewed. Breast exam: no masses; axillae no adenopathy.        Assessment & Plan:   Problem List Items Addressed This Visit      Cardiovascular and Mediastinum   Essential hypertension   Relevant Orders      Basic metabolic panel     Digestive   GERD (gastroesophageal reflux disease)     Endocrine   Diabetes mellitus type II, controlled   Relevant Orders      Microalbumin, urine      Hemoglobin A1c     Other   Dyslipidemia   Relevant Orders      Lipid panel    Other Visit Diagnoses    Well woman exam    -  Primary    High risk medication use        Relevant Orders       Hepatic function panel    Other fatigue        Relevant Orders       TSH       Vit D  25 hydroxy (rtn osteoporosis monitoring)    Vaginal candidiasis        Relevant Medications       fluconazole (DIFLUCAN) tablet 150 mg        Meds ordered this encounter  Medications  . fluconazole (DIFLUCAN) 150 MG tablet    Sig: One po qd prn yeast infection; may repeat in 3-4 days if needed    Dispense:  2 tablet    Refill:  2    Order Specific Question:  Supervising Provider    Answer:  Mikey Kirschner [[id number]]   Recommend healthy diet, regular activity and daily vitamin D/calcium supplementation. Also discussed bariatric surgery. Return in about 4 months (around 07/31/2014).

## 2014-04-08 LAB — BASIC METABOLIC PANEL
BUN: 7 mg/dL (ref 6–23)
CO2: 27 mEq/L (ref 19–32)
Calcium: 9.1 mg/dL (ref 8.4–10.5)
Chloride: 106 mEq/L (ref 96–112)
Creat: 0.69 mg/dL (ref 0.50–1.10)
Glucose, Bld: 111 mg/dL — ABNORMAL HIGH (ref 70–99)
Potassium: 3.8 mEq/L (ref 3.5–5.3)
Sodium: 140 mEq/L (ref 135–145)

## 2014-04-08 LAB — HEPATIC FUNCTION PANEL
ALT: 18 U/L (ref 0–35)
AST: 22 U/L (ref 0–37)
Albumin: 4 g/dL (ref 3.5–5.2)
Alkaline Phosphatase: 55 U/L (ref 39–117)
Bilirubin, Direct: 0.1 mg/dL (ref 0.0–0.3)
Indirect Bilirubin: 0.5 mg/dL (ref 0.2–1.2)
Total Bilirubin: 0.6 mg/dL (ref 0.2–1.2)
Total Protein: 6.6 g/dL (ref 6.0–8.3)

## 2014-04-08 LAB — LIPID PANEL
Cholesterol: 176 mg/dL (ref 0–200)
HDL: 42 mg/dL (ref >39)
LDL Cholesterol: 100 mg/dL — ABNORMAL HIGH (ref 0–99)
Total CHOL/HDL Ratio: 4.2 Ratio
Triglycerides: 169 mg/dL — ABNORMAL HIGH (ref <150)
VLDL: 34 mg/dL (ref 0–40)

## 2014-04-08 LAB — TSH: TSH: 2.095 u[IU]/mL (ref 0.350–4.500)

## 2014-04-09 LAB — HEMOGLOBIN A1C
Hgb A1c MFr Bld: 5.6 % (ref <5.7)
Mean Plasma Glucose: 114 mg/dL (ref <117)

## 2014-04-09 LAB — VITAMIN D 25 HYDROXY (VIT D DEFICIENCY, FRACTURES): Vit D, 25-Hydroxy: 11 ng/mL — ABNORMAL LOW (ref 30–100)

## 2014-04-09 LAB — MICROALBUMIN, URINE: Microalb, Ur: 0.6 mg/dL (ref <2.0)

## 2014-04-13 MED ORDER — VITAMIN D (ERGOCALCIFEROL) 1.25 MG (50000 UNIT) PO CAPS: 50000.0000 [IU] | ORAL_CAPSULE | ORAL | Status: DC

## 2014-04-13 NOTE — Addendum Note (Signed)
Addended byCharolotte Capuchin D on: 04/13/2014 03:21 PM   Modules accepted: Orders

## 2014-04-13 NOTE — Progress Notes (Signed)
Patient notified and verbalized understanding of the test results. No further questions. 

## 2014-05-25 ENCOUNTER — Other Ambulatory Visit: Payer: Self-pay | Admitting: Nurse Practitioner

## 2014-07-08 ENCOUNTER — Other Ambulatory Visit: Payer: Self-pay | Admitting: Family Medicine

## 2014-07-17 ENCOUNTER — Ambulatory Visit (INDEPENDENT_AMBULATORY_CARE_PROVIDER_SITE_OTHER): Payer: 59 | Admitting: Family Medicine

## 2014-07-17 ENCOUNTER — Emergency Department (HOSPITAL_COMMUNITY): Payer: 59

## 2014-07-17 ENCOUNTER — Encounter: Payer: Self-pay | Admitting: Family Medicine

## 2014-07-17 ENCOUNTER — Encounter (HOSPITAL_COMMUNITY): Payer: Self-pay | Admitting: *Deleted

## 2014-07-17 ENCOUNTER — Emergency Department (HOSPITAL_COMMUNITY)
Admission: EM | Admit: 2014-07-17 | Discharge: 2014-07-17 | Disposition: A | Discharge status: Home / Self Care | Payer: 59 | Attending: Emergency Medicine | Admitting: Emergency Medicine

## 2014-07-17 DIAGNOSIS — Z8719 Personal history of other diseases of the digestive system: Secondary | ICD-10-CM | POA: Diagnosis not present

## 2014-07-17 DIAGNOSIS — Z862 Personal history of diseases of the blood and blood-forming organs and certain disorders involving the immune mechanism: Secondary | ICD-10-CM | POA: Diagnosis not present

## 2014-07-17 DIAGNOSIS — Z794 Long term (current) use of insulin: Secondary | ICD-10-CM | POA: Diagnosis not present

## 2014-07-17 DIAGNOSIS — Z79899 Other long term (current) drug therapy: Secondary | ICD-10-CM | POA: Insufficient documentation

## 2014-07-17 DIAGNOSIS — E785 Hyperlipidemia, unspecified: Secondary | ICD-10-CM | POA: Diagnosis not present

## 2014-07-17 DIAGNOSIS — E119 Type 2 diabetes mellitus without complications: Secondary | ICD-10-CM | POA: Diagnosis not present

## 2014-07-17 DIAGNOSIS — I10 Essential (primary) hypertension: Secondary | ICD-10-CM | POA: Diagnosis not present

## 2014-07-17 DIAGNOSIS — Z9851 Tubal ligation status: Secondary | ICD-10-CM | POA: Insufficient documentation

## 2014-07-17 DIAGNOSIS — R112 Nausea with vomiting, unspecified: Secondary | ICD-10-CM | POA: Diagnosis not present

## 2014-07-17 DIAGNOSIS — N8320 Unspecified ovarian cysts: Secondary | ICD-10-CM | POA: Insufficient documentation

## 2014-07-17 DIAGNOSIS — R109 Unspecified abdominal pain: Secondary | ICD-10-CM

## 2014-07-17 DIAGNOSIS — R1032 Left lower quadrant pain: Secondary | ICD-10-CM | POA: Diagnosis not present

## 2014-07-17 DIAGNOSIS — Z7951 Long term (current) use of inhaled steroids: Secondary | ICD-10-CM | POA: Insufficient documentation

## 2014-07-17 DIAGNOSIS — N83202 Unspecified ovarian cyst, left side: Secondary | ICD-10-CM

## 2014-07-17 LAB — URINALYSIS, ROUTINE W REFLEX MICROSCOPIC
Bilirubin Urine: NEGATIVE
Glucose, UA: NEGATIVE mg/dL
Hgb urine dipstick: NEGATIVE
Ketones, ur: NEGATIVE mg/dL
Leukocytes, UA: NEGATIVE
Nitrite: NEGATIVE
Protein, ur: NEGATIVE mg/dL
Specific Gravity, Urine: 1.025 (ref 1.005–1.030)
Urobilinogen, UA: 1 mg/dL (ref 0.0–1.0)
pH: 6 (ref 5.0–8.0)

## 2014-07-17 LAB — POCT URINALYSIS DIPSTICK
Spec Grav, UA: 1.02
pH, UA: 5

## 2014-07-17 LAB — CBC WITH DIFFERENTIAL/PLATELET
Basophils Absolute: 0 10*3/uL (ref 0.0–0.1)
Basophils Relative: 1 % (ref 0–1)
Eosinophils Absolute: 0.2 10*3/uL (ref 0.0–0.7)
Eosinophils Relative: 5 % (ref 0–5)
HCT: 39.6 % (ref 36.0–46.0)
Hemoglobin: 13.7 g/dL (ref 12.0–15.0)
Lymphocytes Relative: 39 % (ref 12–46)
Lymphs Abs: 1.5 10*3/uL (ref 0.7–4.0)
MCH: 30.1 pg (ref 26.0–34.0)
MCHC: 34.6 g/dL (ref 30.0–36.0)
MCV: 87 fL (ref 78.0–100.0)
Monocytes Absolute: 0.5 10*3/uL (ref 0.1–1.0)
Monocytes Relative: 13 % — ABNORMAL HIGH (ref 3–12)
Neutro Abs: 1.7 10*3/uL (ref 1.7–7.7)
Neutrophils Relative %: 42 % — ABNORMAL LOW (ref 43–77)
Platelets: 191 10*3/uL (ref 150–400)
RBC: 4.55 MIL/uL (ref 3.87–5.11)
RDW: 12.8 % (ref 11.5–15.5)
WBC: 4 10*3/uL (ref 4.0–10.5)

## 2014-07-17 LAB — COMPREHENSIVE METABOLIC PANEL
ALT: 19 U/L (ref 0–35)
AST: 25 U/L (ref 0–37)
Albumin: 4.2 g/dL (ref 3.5–5.2)
Alkaline Phosphatase: 55 U/L (ref 39–117)
Anion gap: 7 (ref 5–15)
BUN: 7 mg/dL (ref 6–23)
CO2: 26 mmol/L (ref 19–32)
Calcium: 9.4 mg/dL (ref 8.4–10.5)
Chloride: 107 mmol/L (ref 96–112)
Creatinine, Ser: 0.71 mg/dL (ref 0.50–1.10)
GFR calc Af Amer: >90 mL/min (ref >90)
GFR calc non Af Amer: >90 mL/min (ref >90)
Glucose, Bld: 84 mg/dL (ref 70–99)
Potassium: 4 mmol/L (ref 3.5–5.1)
Sodium: 140 mmol/L (ref 135–145)
Total Bilirubin: 0.8 mg/dL (ref 0.3–1.2)
Total Protein: 7.3 g/dL (ref 6.0–8.3)

## 2014-07-17 MED ORDER — IOHEXOL 300 MG/ML  SOLN
100.0000 mL | Freq: Once | INTRAMUSCULAR | Status: AC | PRN
  Administered 2014-07-17: 100 mL via INTRAVENOUS

## 2014-07-17 MED ORDER — MORPHINE SULFATE 4 MG/ML IJ SOLN
4.0000 mg | INTRAMUSCULAR | Status: DC | PRN
  Administered 2014-07-17: 4 mg via INTRAVENOUS
  Filled 2014-07-17: qty 1

## 2014-07-17 MED ORDER — ONDANSETRON HCL 4 MG/2ML IJ SOLN
4.0000 mg | INTRAMUSCULAR | Status: DC | PRN
  Administered 2014-07-17: 4 mg via INTRAVENOUS
  Filled 2014-07-17: qty 2

## 2014-07-17 MED ORDER — SODIUM CHLORIDE 0.9 % IV SOLN
INTRAVENOUS | Status: DC
  Administered 2014-07-17: 15:00:00 via INTRAVENOUS

## 2014-07-17 MED ORDER — OXYCODONE-ACETAMINOPHEN 5-325 MG PO TABS: 1.0000 | ORAL_TABLET | ORAL | Status: DC | PRN

## 2014-07-17 NOTE — ED Notes (Signed)
Pt finished contrast.  States her pain is better.  No distress.

## 2014-07-17 NOTE — Progress Notes (Signed)
   Subjective:    Patient ID: Amy Dean, female    DOB: 14-Mar-1966, 49 y.o.   MRN: [id number]  Back Pain This is a new problem. Episode onset: Saturday. The problem has been gradually worsening since onset. Pain location: left, lower back. The quality of the pain is described as shooting. The pain radiates to the left thigh, left foot and left knee. The pain is the same all the time. The symptoms are aggravated by position. Stiffness is present in the morning. Associated symptoms include abdominal pain, leg pain, pelvic pain and weakness. She has tried NSAIDs for the symptoms. The treatment provided no relief.  Abdominal Pain This is a new problem. The current episode started in the past 7 days. The onset quality is gradual. The problem occurs constantly. The problem has been rapidly worsening. The pain is located in the LLQ. The pain is at a severity of 8/10. The pain is severe. The quality of the pain is dull and aching. Associated symptoms include anorexia and diarrhea. She has tried acetaminophen for the symptoms. The treatment provided no relief.      Review of Systems  Gastrointestinal: Positive for abdominal pain, diarrhea and anorexia.  Genitourinary: Positive for pelvic pain.  Musculoskeletal: Positive for back pain.  Neurological: Positive for weakness.       Objective:   Physical Exam  Constitutional: She appears well-nourished. No distress.  Cardiovascular: Normal rate, regular rhythm and normal heart sounds.   No murmur heard. Pulmonary/Chest: Effort normal and breath sounds normal. No respiratory distress.  Abdominal: There is tenderness. There is guarding. There is no rebound.  Pain in left lower quadrant  Musculoskeletal: She exhibits no edema.  Lymphadenopathy:    She has no cervical adenopathy.  Neurological: She is alert. She exhibits normal muscle tone.  Psychiatric: Her behavior is normal.  Vitals reviewed.   Walks bent over      Assessment &  Plan:  Suspected diverticulitis Very tender abd ER spoken with Pt may well need labs and CT this am and pain control Pt agrees to go straight to er

## 2014-07-17 NOTE — ED Provider Notes (Signed)
Pt improved Sitting up in bed in no distress We discussed imaging and need for PCP/GYN followup as she may need pelvic ultrasound Pt agreeable with plan She is well appearing, I doubt acute ovarian torsion at this time   Ripley Fraise, MD 07/17/14 1701

## 2014-07-17 NOTE — ED Provider Notes (Signed)
CSN: [id number]     Arrival date & time 07/17/14  1038 History   First MD Initiated Contact with Patient 07/17/14 1358     Chief Complaint  Patient presents with  . Abdominal Pain      HPI Pt was seen at 1405.  Per pt, c/o gradual onset and worsening of persistent LLQ abd "pain" for the past 6 days.  Has been associated with nausea and diarrhea. States the pain is located in her LLQ, and radiates into her left lower back and left leg. Pt was evaluated by her PMD today PTA, then sent to the ED for further evaluation (PMD concerned about possible diverticulitis). Denies vomiting, no fevers, no back pain, no rash, no CP/SOB, no black or blood in stools.       Past Medical History  Diagnosis Date  . Hypertension   . Enteritis   . Diabetes mellitus   . Anemia     resolved post hysterectomy  . IFG (impaired fasting glucose)   . Dyslipidemia    Past Surgical History  Procedure Laterality Date  . Cesarean section  1982 and 1989    x2  . Tonsillectomy    . Hysteroscopy w/ endometrial ablation  07/22/2008    K.Harrington Challenger MD [facility]  . Tubal ligation    . Supracervical abdominal hysterectomy  03/21/2011    Procedure: HYSTERECTOMY SUPRACERVICAL ABDOMINAL;  Surgeon: Jonnie Kind, MD;  Location: [facility];  Service: Gynecology;;  . Abdominal hysterectomy    . Eus  10/26/2011    Procedure: UPPER ENDOSCOPIC ULTRASOUND (EUS) LINEAR;  Surgeon: Milus Banister, MD;  Location: [facility];  Service: Endoscopy;  Laterality: N/A;   Family History  Problem Relation Age of Onset  . Anesthesia problems Neg Hx   . Hypotension Neg Hx   . Malignant hyperthermia Neg Hx   . Pseudochol deficiency Neg Hx   . Healthy Son   . Sarcoidosis Mother   . Heart attack Paternal Uncle   . Hypertension Maternal Grandmother    History  Substance Use Topics  . Smoking status: Never Smoker   . Smokeless tobacco: Never Used  . Alcohol Use: Yes     Comment: occastionally on holidays; 2 drinks at a time   OB History    Gravida Para Term Preterm AB TAB SAB Ectopic Multiple Living   3 2   1  1   2      Review of Systems ROS: Statement: All systems negative except as marked or noted in the HPI; Constitutional: Negative for fever and chills. ; ; Eyes: Negative for eye pain, redness and discharge. ; ; ENMT: Negative for ear pain, hoarseness, nasal congestion, sinus pressure and sore throat. ; ; Cardiovascular: Negative for chest pain, palpitations, diaphoresis, dyspnea and peripheral edema. ; ; Respiratory: Negative for cough, wheezing and stridor. ; ; Gastrointestinal: +abd pain, nausea, diarrhea. Negative for vomiting, blood in stool, hematemesis, jaundice and rectal bleeding. . ; ; Genitourinary: Negative for dysuria, flank pain and hematuria. ; ; Musculoskeletal: Negative for back pain and neck pain. Negative for swelling and trauma.; ; Skin: Negative for pruritus, rash, abrasions, blisters, bruising and skin lesion.; ; Neuro: Negative for headache, lightheadedness and neck stiffness. Negative for weakness, altered level of consciousness , altered mental status, extremity weakness, paresthesias, involuntary movement, seizure and syncope.      Allergies  Review of patient's allergies indicates no known allergies.  Home Medications   Prior to Admission medications   Medication Sig Start Date  End Date Taking? Authorizing Provider  acetaminophen (TYLENOL) 500 MG tablet Take 500 mg by mouth as needed.   Yes Historical Provider, MD  ALPRAZolam Duanne Moron) 0.5 MG tablet Take 1 tablet (0.5 mg total) by mouth at bedtime as needed for anxiety or sleep. 01/05/14  Yes Nilda Simmer, NP  citalopram (CELEXA) 20 MG tablet Take 1 tablet (20 mg total) by mouth daily. 02/04/14  Yes Nilda Simmer, NP  fexofenadine (ALLEGRA) 180 MG tablet Take 1 tablet (180 mg total) by mouth daily. 08/20/13  Yes Nilda Simmer, NP  fluticasone (FLONASE) 50 MCG/ACT nasal spray Place 2 sprays into both nostrils daily. 08/20/13  Yes Nilda Simmer, NP  Liraglutide 18 MG/3ML SOPN Inject 1.2 mg subcu once daily 12/29/13  Yes Nilda Simmer, NP  lisinopril-hydrochlorothiazide (PRINZIDE,ZESTORETIC) 20-12.5 MG per tablet TAKE 1 TABLET BY MOUTH DAILY FOR FOR BLOOD PRESSURE 07/08/14  Yes Mikey Kirschner, MD  metFORMIN (GLUCOPHAGE) 1000 MG tablet TAKE 1 TABLET (1,000 MG TOTAL) BY MOUTH 2 (TWO) TIMES DAILY WITH A MEAL. Patient taking differently: TAKE 1 TABLET (1,000 MG TOTAL) BY MOUTH ONCE DAILY 01/16/14  Yes Nilda Simmer, NP  naproxen sodium (ANAPROX) 220 MG tablet Take 220-440 mg by mouth daily as needed (for pain).   Yes Historical Provider, MD  pantoprazole (PROTONIX) 40 MG tablet TAKE 1 TABLET (40 MG TOTAL) BY MOUTH DAILY FOR ACID REFLUX 05/25/14  Yes Kathyrn Drown, MD  pravastatin (PRAVACHOL) 20 MG tablet TAKE 1 TABLET BY MOUTH DAILY FOR CHOLESTEROL 05/25/14  Yes Kathyrn Drown, MD  cyclobenzaprine (FLEXERIL) 10 MG tablet Take 1 tablet (10 mg total) by mouth 3 (three) times daily as needed for muscle spasms. Patient not taking: Reported on 07/17/2014 10/05/13   Fara Chute, PA-C  Insulin Pen Needle (PEN NEEDLES 31GX5/16") 31G X 8 MM MISC Use daily as directed 07/16/13   Nilda Simmer, NP  meloxicam (MOBIC) 15 MG tablet Take 1 tablet (15 mg total) by mouth daily. Patient not taking: Reported on 07/17/2014 10/05/13   Chelle S Jeffery, PA-C  ONE TOUCH ULTRA TEST test strip USE TWICE DAILY AS DIRECTED 03/10/13   Kathyrn Drown, MD  Vitamin D, Ergocalciferol, (DRISDOL) 50000 UNITS CAPS capsule Take 1 capsule (50,000 Units total) by mouth every 7 (seven) days. Patient not taking: Reported on 07/17/2014 04/13/14   Nilda Simmer, NP   BP 165/93 mmHg  Pulse 73  Temp(Src) 98.3 F (36.8 C) (Oral)  Resp 18  Ht 5\' 4"  (1.626 m)  Wt 189 lb (85.73 kg)  BMI 32.43 kg/m2  SpO2 99%  LMP 02/10/2011 Physical Exam  1410: Physical examination:  Nursing notes reviewed; Vital signs and O2 SAT reviewed;  Constitutional: Well developed, Well  nourished, Well hydrated, Uncomfortable appearing.; Head:  Normocephalic, atraumatic; Eyes: EOMI, PERRL, No scleral icterus; ENMT: Mouth and pharynx normal, Mucous membranes moist; Neck: Supple, Full range of motion, No lymphadenopathy; Cardiovascular: Regular rate and rhythm, No murmur, rub, or gallop; Respiratory: Breath sounds clear & equal bilaterally, No rales, rhonchi, wheezes.  Speaking full sentences with ease, Normal respiratory effort/excursion; Chest: Nontender, Movement normal; Abdomen: Soft, +LLQ tender to palp. Nondistended, Normal bowel sounds; Genitourinary: No CVA tenderness; Spine:  No midline CS, TS, LS tenderness.;; Extremities: Pulses normal, No tenderness, No edema, No calf edema or asymmetry.; Neuro: AA&Ox3, Major CN grossly intact.  Speech clear. No gross focal motor or sensory deficits in extremities.; Skin: Color normal, Warm, Dry.   ED Course  Procedures     EKG Interpretation None      MDM  MDM Reviewed: previous chart, nursing note and vitals Reviewed previous: labs Interpretation: labs     Results for orders placed or performed during the hospital encounter of 07/17/14  CBC WITH DIFFERENTIAL  Result Value Ref Range   WBC 4.0 4.0 - 10.5 K/uL   RBC 4.55 3.87 - 5.11 MIL/uL   Hemoglobin 13.7 12.0 - 15.0 g/dL   HCT 39.6 36.0 - 46.0 %   MCV 87.0 78.0 - 100.0 fL   MCH 30.1 26.0 - 34.0 pg   MCHC 34.6 30.0 - 36.0 g/dL   RDW 12.8 11.5 - 15.5 %   Platelets 191 150 - 400 K/uL   Neutrophils Relative % 42 (L) 43 - 77 %   Neutro Abs 1.7 1.7 - 7.7 K/uL   Lymphocytes Relative 39 12 - 46 %   Lymphs Abs 1.5 0.7 - 4.0 K/uL   Monocytes Relative 13 (H) 3 - 12 %   Monocytes Absolute 0.5 0.1 - 1.0 K/uL   Eosinophils Relative 5 0 - 5 %   Eosinophils Absolute 0.2 0.0 - 0.7 K/uL   Basophils Relative 1 0 - 1 %   Basophils Absolute 0.0 0.0 - 0.1 K/uL  Comprehensive metabolic panel  Result Value Ref Range   Sodium 140 135 - 145 mmol/L   Potassium 4.0 3.5 - 5.1 mmol/L    Chloride 107 96 - 112 mmol/L   CO2 26 19 - 32 mmol/L   Glucose, Bld 84 70 - 99 mg/dL   BUN 7 6 - 23 mg/dL   Creatinine, Ser 0.71 0.50 - 1.10 mg/dL   Calcium 9.4 8.4 - 10.5 mg/dL   Total Protein 7.3 6.0 - 8.3 g/dL   Albumin 4.2 3.5 - 5.2 g/dL   AST 25 0 - 37 U/L   ALT 19 0 - 35 U/L   Alkaline Phosphatase 55 39 - 117 U/L   Total Bilirubin 0.8 0.3 - 1.2 mg/dL   GFR calc non Af Amer >90 >90 mL/min   GFR calc Af Amer >90 >90 mL/min   Anion gap 7 5 - 15  Urinalysis with microscopic  Result Value Ref Range   Color, Urine YELLOW YELLOW   APPearance CLEAR CLEAR   Specific Gravity, Urine 1.025 1.005 - 1.030   pH 6.0 5.0 - 8.0   Glucose, UA NEGATIVE NEGATIVE mg/dL   Hgb urine dipstick NEGATIVE NEGATIVE   Bilirubin Urine NEGATIVE NEGATIVE   Ketones, ur NEGATIVE NEGATIVE mg/dL   Protein, ur NEGATIVE NEGATIVE mg/dL   Urobilinogen, UA 1.0 0.0 - 1.0 mg/dL   Nitrite NEGATIVE NEGATIVE   Leukocytes, UA NEGATIVE NEGATIVE    1630:  CT A/P pending. Sign out to Dr. Christy Gentles.     Francine Graven, DO 07/17/14 1637

## 2014-07-17 NOTE — ED Notes (Signed)
Pt sent over by Dr. Sallee Lange with a note stating "Was seen today. Severe LLQ pain --suspected diverticulitis. May need labs and scan. ER doc spoken with." Pt states LLQ pain radiating around to left lower back with pain going down right leg. Pt states nausea at times and had diarrhea on Sat and Sun.

## 2014-07-17 NOTE — Discharge Instructions (Signed)

## 2014-07-20 ENCOUNTER — Telehealth: Payer: Self-pay | Admitting: Family Medicine

## 2014-07-20 ENCOUNTER — Other Ambulatory Visit: Payer: Self-pay | Admitting: *Deleted

## 2014-07-20 DIAGNOSIS — N83209 Unspecified ovarian cyst, unspecified side: Secondary | ICD-10-CM

## 2014-07-20 DIAGNOSIS — M25559 Pain in unspecified hip: Secondary | ICD-10-CM

## 2014-07-20 MED ORDER — OXYCODONE-ACETAMINOPHEN 10-325 MG PO TABS: 1.0000 | ORAL_TABLET | ORAL | Status: DC | PRN

## 2014-07-20 NOTE — Telephone Encounter (Signed)
Script at front window. See note below

## 2014-07-20 NOTE — Telephone Encounter (Signed)
[facility] to let pt know about rx and about appt infor for ultrasound. See note below.

## 2014-07-20 NOTE — Telephone Encounter (Signed)
We can prescribed a stronger version of the same medicine. Percocet 10 mg/325 mg. #30. 1 every 4 hours when necessary severe pain caution drowsiness do not overuse

## 2014-07-20 NOTE — Telephone Encounter (Signed)
Patient called wanting results from test on Friday.Checked with nurse but no orders werent put in from our office so nurses couldn't give results.Patient states still hurting in side.

## 2014-07-20 NOTE — Telephone Encounter (Signed)
Seen here 3/11 and sent to ED. Had ct abd/pelvis. Pt is wanting results of test. Pt states the pain is about the same. Pain in LLQ and goes around to her back and nausea. No fever. Pt states she is almost out of pain med they gave her in the ED. She has one half tablet left. Please advise.

## 2014-07-20 NOTE — Telephone Encounter (Signed)
Pt states the percocet 5/325 is what they gave her in the ED and it does not help. Can something else be prescribed for pain. Ultrasound thurs 3/17 [facility] register 9 am. Needs to have a full bladder.  Drink 32 oz of water one hour prior to test.

## 2014-07-20 NOTE — Addendum Note (Signed)
Addended by: Carmelina Noun on: 07/20/2014 01:51 PM   Modules accepted: Orders

## 2014-07-20 NOTE — Telephone Encounter (Signed)
This scan did not show any diverticulitis. Typically the ER will discuss test results with the patient. According to their notes they did. That is why I did not call her over the weekend. This scan did show what appears to be a ovarian cyst. With her having ongoing pain I would recommend setting her up for a pelvic ultrasound to look at the ovaries. I would also recommend a follow-up office visit at least one day after the ultrasound. Try to do the ultrasound within the next couple days. She may have a prescription for 20 Percocet 5 mg/325 mg one every 4 hours when necessary severe pain caution drowsiness. If she needs work no please give her a work note as well.

## 2014-07-21 ENCOUNTER — Ambulatory Visit (HOSPITAL_COMMUNITY)
Admission: RE | Admit: 2014-07-21 | Discharge: 2014-07-21 | Disposition: A | Discharge status: Home / Self Care | Payer: 59 | Source: Ambulatory Visit | Attending: Family Medicine | Admitting: Family Medicine

## 2014-07-21 ENCOUNTER — Ambulatory Visit (HOSPITAL_COMMUNITY): Admission: RE | Admit: 2014-07-21 | Payer: 59 | Source: Ambulatory Visit

## 2014-07-21 DIAGNOSIS — N8320 Unspecified ovarian cysts: Secondary | ICD-10-CM | POA: Diagnosis present

## 2014-07-21 DIAGNOSIS — N83209 Unspecified ovarian cyst, unspecified side: Secondary | ICD-10-CM

## 2014-07-21 DIAGNOSIS — M25559 Pain in unspecified hip: Secondary | ICD-10-CM | POA: Diagnosis not present

## 2014-07-21 NOTE — Telephone Encounter (Signed)
Patient notified and verbalized understanding of U/S and pain med.

## 2014-07-22 NOTE — Progress Notes (Signed)
Patient notified and verbalized understanding of the test results. No further questions. 

## 2014-07-23 ENCOUNTER — Other Ambulatory Visit (HOSPITAL_COMMUNITY): Payer: Self-pay

## 2014-07-24 ENCOUNTER — Encounter: Payer: Self-pay | Admitting: Obstetrics and Gynecology

## 2014-07-24 ENCOUNTER — Ambulatory Visit (INDEPENDENT_AMBULATORY_CARE_PROVIDER_SITE_OTHER): Payer: 59 | Admitting: Obstetrics and Gynecology

## 2014-07-24 VITALS — BP 120/76 | Ht 64.0 in | Wt 186.0 lb

## 2014-07-24 DIAGNOSIS — R102 Pelvic and perineal pain: Secondary | ICD-10-CM

## 2014-07-24 DIAGNOSIS — N8320 Unspecified ovarian cysts: Secondary | ICD-10-CM | POA: Diagnosis not present

## 2014-07-24 DIAGNOSIS — N83202 Unspecified ovarian cyst, left side: Secondary | ICD-10-CM

## 2014-07-24 NOTE — Progress Notes (Signed)
Patient ID: Amy Dean, female   DOB: 03/04/1966, 49 y.o.   MRN: [id number]    [facility] Visit  Patient name: Amy Dean MRN [id number]  Date of birth: [birth year]  CC & HPI:  Amy Dean is a 49 y.o. female s/p tubal ligation and supracervical abdominal hysterectomy presenting today for gradually improving LLQ abdominal pain that radiates to her left lower back and started 2 weeks ago. Pt was seen at the ED on 3/11 for the same pain and was diagnosed for left ovarian cyst. A [place] on 3/15 showed a left ovarian cyst measuring 2.2 x 2.5 x 1.4 cm. Pt reports reduced pain today. There is SLIGHT llq pain from posterior superior iliac crest to anterior left groin, that is worse with walking.  ROS:  A complete 10 system review of systems was obtained and all systems are negative except as noted in the HPI and PMH.   Pertinent History Reviewed:   Reviewed: Significant for tubal ligation and supracervical abdominal hysterectomy Medical         Past Medical History  Diagnosis Date   Hypertension    Enteritis    Diabetes mellitus    Anemia     resolved post hysterectomy   IFG (impaired fasting glucose)    Dyslipidemia                               Surgical Hx:    Past Surgical History  Procedure Laterality Date   Cesarean section  1982 and 1989    x2   Tonsillectomy     Hysteroscopy w/ endometrial ablation  07/22/2008    K.Ross MD [facility]   Tubal ligation     Supracervical abdominal hysterectomy  03/21/2011    Procedure: HYSTERECTOMY SUPRACERVICAL ABDOMINAL;  Surgeon: Jonnie Kind, MD;  Location: [facility];  Service: Gynecology;;   Abdominal hysterectomy     Eus  10/26/2011    Procedure: UPPER ENDOSCOPIC ULTRASOUND (EUS) LINEAR;  Surgeon: Milus Banister, MD;  Location: [facility];  Service: Endoscopy;  Laterality: N/A;   Medications: Reviewed & Updated - see associated section                       Current outpatient prescriptions:     acetaminophen (TYLENOL) 500 MG tablet, Take 500 mg by mouth as needed., Disp: , Rfl:    ALPRAZolam (XANAX) 0.5 MG tablet, Take 1 tablet (0.5 mg total) by mouth at bedtime as needed for anxiety or sleep., Disp: 30 tablet, Rfl: 2   citalopram (CELEXA) 20 MG tablet, Take 1 tablet (20 mg total) by mouth daily., Disp: 90 tablet, Rfl: 1   cyclobenzaprine (FLEXERIL) 10 MG tablet, Take 1 tablet (10 mg total) by mouth 3 (three) times daily as needed for muscle spasms. (Patient not taking: Reported on 07/17/2014), Disp: 30 tablet, Rfl: 0   fexofenadine (ALLEGRA) 180 MG tablet, Take 1 tablet (180 mg total) by mouth daily., Disp: 90 tablet, Rfl: 1   fluticasone (FLONASE) 50 MCG/ACT nasal spray, Place 2 sprays into both nostrils daily., Disp: 48 g, Rfl: 11   Insulin Pen Needle (PEN NEEDLES 31GX5/16") 31G X 8 MM MISC, Use daily as directed, Disp: 100 each, Rfl: 11   Liraglutide 18 MG/3ML SOPN, Inject 1.2 mg subcu once daily, Disp: 2 pen, Rfl: 5   lisinopril-hydrochlorothiazide (PRINZIDE,ZESTORETIC) 20-12.5 MG per tablet, TAKE 1 TABLET BY MOUTH DAILY  FOR FOR BLOOD PRESSURE, Disp: 90 tablet, Rfl: 0   metFORMIN (GLUCOPHAGE) 1000 MG tablet, TAKE 1 TABLET (1,000 MG TOTAL) BY MOUTH 2 (TWO) TIMES DAILY WITH A MEAL. (Patient taking differently: TAKE 1 TABLET (1,000 MG TOTAL) BY MOUTH ONCE DAILY), Disp: 180 tablet, Rfl: 1   naproxen sodium (ANAPROX) 220 MG tablet, Take 220-440 mg by mouth daily as needed (for pain)., Disp: , Rfl:    ONE TOUCH ULTRA TEST test strip, USE TWICE DAILY AS DIRECTED, Disp: 200 each, Rfl: PRN   oxyCODONE-acetaminophen (PERCOCET) 10-325 MG per tablet, Take 1 tablet by mouth every 4 (four) hours as needed for pain. Caution drowsiness, Disp: 30 tablet, Rfl: 0   pantoprazole (PROTONIX) 40 MG tablet, TAKE 1 TABLET (40 MG TOTAL) BY MOUTH DAILY FOR ACID REFLUX, Disp: 90 tablet, Rfl: 1   pravastatin (PRAVACHOL) 20 MG tablet, TAKE 1 TABLET BY MOUTH DAILY FOR CHOLESTEROL, Disp: 90 tablet, Rfl:  1   Social History: Reviewed -  reports that she has never smoked. She has never used smokeless tobacco.  Objective Findings:  Vitals: Blood pressure 120/76, height 5\' 4"  (1.626 m), weight 186 lb (84.369 kg), last menstrual period 02/10/2011.  Physical Examination: General appearance - alert, well appearing, and in no distress and oriented to person, place, and time Mental status - alert, oriented to person, place, and time, normal mood, behavior, speech, dress, motor activity, and thought processes Pelvic - normal external genitalia, vulva, vagina, cervix VULVA: normal appearing vulva with no masses, tenderness or lesions VAGINA: normal appearing vagina with normal color and discharge, no lesions; good vaginal secretions; well-estrogenized CERVIX: normal appearing cervix without discharge or lesions; normal secretions; 4.5 cm cervical stump; mobile UTERUS: Surgically absent ADNEXA: Surgically absent  Assessment & Plan:   A:  1. Transvaginal US shows normal appearing cervix; 4.5 cm cervical stump 2. Resolved ovarian cyst  P:  1. Follow-up prn     This chart was scribed for Jonnie Kind, MD by Tula Nakayama, ED Scribe. This patient was seen in room 2 and the patient's care was started at 12:11 PM.   I personally performed the services described in this documentation, which was SCRIBED in my presence. The recorded information has been reviewed and considered accurate. It has been edited as necessary during review. Jonnie Kind, MD

## 2014-07-24 NOTE — Progress Notes (Signed)
Patient ID: Amy Dean, female   DOB: 28-Feb-1966, 49 y.o.   MRN: [id number] Pt here today for follow up on ovarian cyst. Pt states that she was told that she has an ovarian cyst on her left ovary. Pt states that the pain on her left side has gotten better but she still has the pain there.

## 2014-07-30 ENCOUNTER — Ambulatory Visit: Payer: 59

## 2014-08-03 ENCOUNTER — Encounter (INDEPENDENT_AMBULATORY_CARE_PROVIDER_SITE_OTHER): Payer: Self-pay | Admitting: *Deleted

## 2014-08-31 ENCOUNTER — Other Ambulatory Visit: Payer: Self-pay | Admitting: *Deleted

## 2014-08-31 ENCOUNTER — Encounter: Payer: Self-pay | Admitting: *Deleted

## 2014-08-31 ENCOUNTER — Ambulatory Visit: Payer: 59 | Admitting: Nurse Practitioner

## 2014-08-31 NOTE — Patient Outreach (Signed)
[facility]) Care Management   08/31/2014  Amy Dean 14-May-1965 [id number]  Amy Dean is an 49 y.o. female who presents for routine Link To Wellness follow up for self management assistance with Type II DM.  Subjective:  States she recently saw her primary care provider and her gynecologist for intermittent  abdominal pain that prompted a visit to the emergency department at [facility] on 3/11. She says all the tests that were done (CT and ultrasound of abdomen and pelvis) did not show anything significant. Says she is treating the pain with prescription pain medication as needed but she uses infrequently. She attributes her weight loss of approximately 10 lbs to eating less due to early satiety related to the Victoza. Says she is not checking her blood sugars.  Objective:   ROS  Physical Exam  Constitutional: She is oriented to person, place, and time. She appears well-developed and well-nourished.  Neurological: She is alert and oriented to person, place, and time.  Psychiatric: She has a normal mood and affect. Her behavior is normal. Judgment and thought content normal.    Current Medications:   Current Outpatient Prescriptions  Medication Sig Dispense Refill  . acetaminophen (TYLENOL) 500 MG tablet Take 500 mg by mouth as needed.    . ALPRAZolam (XANAX) 0.5 MG tablet Take 1 tablet (0.5 mg total) by mouth at bedtime as needed for anxiety or sleep. 30 tablet 2  . citalopram (CELEXA) 20 MG tablet Take 1 tablet (20 mg total) by mouth daily. 90 tablet 1  . cyclobenzaprine (FLEXERIL) 10 MG tablet Take 1 tablet (10 mg total) by mouth 3 (three) times daily as needed for muscle spasms. (Patient not taking: Reported on 07/17/2014) 30 tablet 0  . fexofenadine (ALLEGRA) 180 MG tablet Take 1 tablet (180 mg total) by mouth daily. 90 tablet 1  . fluticasone (FLONASE) 50 MCG/ACT nasal spray Place 2 sprays into both nostrils daily. (Patient not taking:  Reported on 08/31/2014) 48 g 11  . Insulin Pen Needle (PEN NEEDLES 31GX5/16") 31G X 8 MM MISC Use daily as directed 100 each 11  . Liraglutide 18 MG/3ML SOPN Inject 1.2 mg subcu once daily 2 pen 5  . lisinopril-hydrochlorothiazide (PRINZIDE,ZESTORETIC) 20-12.5 MG per tablet TAKE 1 TABLET BY MOUTH DAILY FOR FOR BLOOD PRESSURE 90 tablet 0  . metFORMIN (GLUCOPHAGE) 1000 MG tablet TAKE 1 TABLET (1,000 MG TOTAL) BY MOUTH 2 (TWO) TIMES DAILY WITH A MEAL. (Patient taking differently: TAKE 1 TABLET (1,000 MG TOTAL) BY MOUTH ONCE DAILY) 180 tablet 1  . naproxen sodium (ANAPROX) 220 MG tablet Take 220-440 mg by mouth daily as needed (for pain).    . ONE TOUCH ULTRA TEST test strip USE TWICE DAILY AS DIRECTED (Patient not taking: Reported on 08/31/2014) 200 each PRN  . oxyCODONE-acetaminophen (PERCOCET) 10-325 MG per tablet Take 1 tablet by mouth every 4 (four) hours as needed for pain. Caution drowsiness 30 tablet 0  . pantoprazole (PROTONIX) 40 MG tablet TAKE 1 TABLET (40 MG TOTAL) BY MOUTH DAILY FOR ACID REFLUX 90 tablet 1  . pravastatin (PRAVACHOL) 20 MG tablet TAKE 1 TABLET BY MOUTH DAILY FOR CHOLESTEROL 90 tablet 1   No current facility-administered medications for this visit.    Functional Status:   In your present state of health, do you have any difficulty performing the following activities: 08/31/2014  Hearing? N  Vision? N  Difficulty concentrating or making decisions? N  Walking or climbing stairs? N  Dressing or bathing?  N  Doing errands, shopping? N    Fall/Depression Screening:    PHQ 2/9 Scores 08/31/2014  PHQ - 2 Score 0   Filed Weights   08/31/14 0915  Weight: 181 lb (82.101 kg)   Filed Vitals:   08/31/14 0915  BP: 120/60   THN CM Care Plan Problem One        Patient Outreach from 08/31/2014 in [place] Problem One  Type II DM meeting target A1C as evidenced by A1C= 5.6% on 04/08/14   Care Plan for Problem One  Active   THN Long Term Goal Start  Date  08/31/14   Interventions for Problem One Long Term Goal  using graphic reviewed pathophysiologic metabolic deficits in Type II DM, reviewed role of obesity on insulin resistance and congratulated Amy Dean on her significant weight loss, reviewed patient's medications and the mechanism of action of Victoza and Metformin and their common side effects, reviewed effects of physical activity on insulin resistance and encouraged Amy Dean to do some form of exercise  that she enjoys, reviewed the American Diabetes Association recommendation of 150 minutes per week including two sessions of resistance exercise, reviewed timing of upcoming appointments and scheduled Link To Wellness follow up     Assessment:   Link To Wellness member with Type II DM meeting target A1C as evidenced by A1C= 5.6 on 04/08/14.  Plan:  RNCM to fax today's office visit note to Dr. Sallee Lange. RNCM will meet at least every 6 months and as needed with patient per Link To Wellness program guidelines to assist with Type II DM self-management and assess patient's progress toward mutually set goals.  Barrington Ellison RN,CCM,CDE [facility] Management Coordinator Office Phone [phone] Office Fax [phone]

## 2014-10-14 LAB — HM DIABETES EYE EXAM

## 2014-10-15 ENCOUNTER — Other Ambulatory Visit: Payer: Self-pay | Admitting: Family Medicine

## 2014-10-15 NOTE — Telephone Encounter (Signed)
Needs office visit.

## 2014-10-16 ENCOUNTER — Encounter: Payer: Self-pay | Admitting: *Deleted

## 2014-11-23 ENCOUNTER — Other Ambulatory Visit: Payer: Self-pay | Admitting: Family Medicine

## 2014-11-23 NOTE — Telephone Encounter (Signed)
Needs office visit.

## 2014-12-04 ENCOUNTER — Other Ambulatory Visit: Payer: Self-pay | Admitting: *Deleted

## 2014-12-04 MED ORDER — LIRAGLUTIDE 18 MG/3ML ~~LOC~~ SOPN: PEN_INJECTOR | SUBCUTANEOUS | Status: DC

## 2014-12-09 ENCOUNTER — Encounter (INDEPENDENT_AMBULATORY_CARE_PROVIDER_SITE_OTHER): Payer: Self-pay | Admitting: *Deleted

## 2014-12-10 ENCOUNTER — Encounter: Payer: Self-pay | Admitting: Nurse Practitioner

## 2014-12-10 ENCOUNTER — Ambulatory Visit (INDEPENDENT_AMBULATORY_CARE_PROVIDER_SITE_OTHER): Payer: 59 | Admitting: Nurse Practitioner

## 2014-12-10 VITALS — BP 150/88 | Ht 64.0 in | Wt 191.0 lb

## 2014-12-10 DIAGNOSIS — I10 Essential (primary) hypertension: Secondary | ICD-10-CM

## 2014-12-10 DIAGNOSIS — E785 Hyperlipidemia, unspecified: Secondary | ICD-10-CM

## 2014-12-10 DIAGNOSIS — K219 Gastro-esophageal reflux disease without esophagitis: Secondary | ICD-10-CM | POA: Diagnosis not present

## 2014-12-10 DIAGNOSIS — Z79899 Other long term (current) drug therapy: Secondary | ICD-10-CM

## 2014-12-10 DIAGNOSIS — F418 Other specified anxiety disorders: Secondary | ICD-10-CM | POA: Diagnosis not present

## 2014-12-10 DIAGNOSIS — E119 Type 2 diabetes mellitus without complications: Secondary | ICD-10-CM

## 2014-12-10 DIAGNOSIS — Z13820 Encounter for screening for osteoporosis: Secondary | ICD-10-CM

## 2014-12-10 LAB — POCT GLYCOSYLATED HEMOGLOBIN (HGB A1C): Hemoglobin A1C: 5.3

## 2014-12-10 MED ORDER — ALPRAZOLAM 0.5 MG PO TABS: 0.5000 mg | ORAL_TABLET | Freq: Every evening | ORAL | Status: DC | PRN

## 2014-12-10 MED ORDER — AMLODIPINE BESYLATE 5 MG PO TABS: 5.0000 mg | ORAL_TABLET | Freq: Every day | ORAL | Status: DC

## 2014-12-10 MED ORDER — LIRAGLUTIDE 18 MG/3ML ~~LOC~~ SOPN: PEN_INJECTOR | SUBCUTANEOUS | Status: DC

## 2014-12-10 MED ORDER — CITALOPRAM HYDROBROMIDE 20 MG PO TABS: ORAL_TABLET | ORAL | Status: DC

## 2014-12-10 NOTE — Patient Instructions (Signed)
Take Metformin 1000 mg 1/2 tab po BID

## 2014-12-12 ENCOUNTER — Encounter: Payer: Self-pay | Admitting: Nurse Practitioner

## 2014-12-12 NOTE — Progress Notes (Signed)
Subjective:  Presents for routine follow-up. Celexa 20 mg working fair. Would like to increase dosage. Has had some breakthrough depression and anxiety symptoms. Rare use of Xanax. Currently on Victoza 1.2 mg per day. Compliant with medications. No chest pain/ischemic type pain or shortness of breath. Reflux has been stable. No regular exercise. Doing fairly well with her diet.  Objective:   BP 150/88 mmHg  Ht 5\' 4"  (1.626 m)  Wt 191 lb (86.637 kg)  BMI 32.77 kg/m2  LMP 02/10/2011 NAD. Alert, oriented. Lungs clear. Heart regular rate rhythm. Abdomen soft nondistended nontender. Lower extremities no edema. BP on recheck right arm sitting 166/86. Results for orders placed or performed in visit on 12/10/14  POCT HgB A1C  Result Value Ref Range   Hemoglobin A1C 5.3      Assessment:  Problem List Items Addressed This Visit      Cardiovascular and Mediastinum   Essential hypertension - Primary   Relevant Medications   amLODipine (NORVASC) 5 MG tablet     Digestive   GERD (gastroesophageal reflux disease)     Endocrine   Diabetes mellitus type II, controlled   Relevant Medications   Liraglutide 18 MG/3ML SOPN   Other Relevant Orders   POCT HgB A1C (Completed)     Other   Depression with anxiety   Dyslipidemia   Relevant Orders   Lipid panel   Hepatic function panel    Other Visit Diagnoses    Screening for osteoporosis        Relevant Orders    Vit D  25 hydroxy (rtn osteoporosis monitoring)    High risk medication use        Relevant Orders    Hepatic function panel      Plan:  Meds ordered this encounter  Medications  . ALPRAZolam (XANAX) 0.5 MG tablet    Sig: Take 1 tablet (0.5 mg total) by mouth at bedtime as needed for anxiety or sleep.    Dispense:  30 tablet    Refill:  2    Order Specific Question:  Supervising Provider    Answer:  Mikey Kirschner [[id number]]  . citalopram (CELEXA) 20 MG tablet    Sig: 1 1/2 tabs po qd    Dispense:  135 tablet    Refill:  1     Order Specific Question:  Supervising Provider    Answer:  Mikey Kirschner [[id number]]  . Liraglutide 18 MG/3ML SOPN    Sig: Inject 1.2 mg subcu once daily    Dispense:  6 pen    Refill:  1    Order Specific Question:  Supervising Provider    Answer:  Mikey Kirschner [[id number]]  . amLODipine (NORVASC) 5 MG tablet    Sig: Take 1 tablet (5 mg total) by mouth daily.    Dispense:  90 tablet    Refill:  0    Order Specific Question:  Supervising Provider    Answer:  Mikey Kirschner [[id number]]   Reduce metformin dose to 500 mg twice a day, may cut her 1000 mg tablet in half. Increase Celexa to one and a half tabs by mouth daily. Go back to 20 mg if any problems. Add low-dose amlodipine to regimen for blood pressure. Continue to use Xanax sparingly. Recommend weight loss and regular exercise. Return in about 2 months (around 02/09/2015) for BP recheck. Reminded about preventive health physical this fall, we'll discuss colonoscopy at that time.

## 2014-12-13 LAB — LIPID PANEL
Chol/HDL Ratio: 5.5 ratio units — ABNORMAL HIGH (ref 0.0–4.4)
Cholesterol, Total: 187 mg/dL (ref 100–199)
HDL: 34 mg/dL — ABNORMAL LOW (ref >39)
LDL Calculated: 88 mg/dL (ref 0–99)
Triglycerides: 323 mg/dL — ABNORMAL HIGH (ref 0–149)
VLDL Cholesterol Cal: 65 mg/dL — ABNORMAL HIGH (ref 5–40)

## 2014-12-13 LAB — HEPATIC FUNCTION PANEL
ALT: 19 IU/L (ref 0–32)
AST: 26 IU/L (ref 0–40)
Albumin: 4.6 g/dL (ref 3.5–5.5)
Alkaline Phosphatase: 64 IU/L (ref 39–117)
Bilirubin Total: 0.4 mg/dL (ref 0.0–1.2)
Bilirubin, Direct: 0.13 mg/dL (ref 0.00–0.40)
Total Protein: 7.4 g/dL (ref 6.0–8.5)

## 2014-12-14 ENCOUNTER — Encounter: Payer: Self-pay | Admitting: *Deleted

## 2014-12-14 ENCOUNTER — Other Ambulatory Visit: Payer: Self-pay | Admitting: *Deleted

## 2014-12-14 LAB — VITAMIN D 25 HYDROXY (VIT D DEFICIENCY, FRACTURES): Vit D, 25-Hydroxy: 16.5 ng/mL — ABNORMAL LOW (ref 30.0–100.0)

## 2014-12-14 NOTE — Patient Outreach (Signed)
[facility]) Care Management   12/14/2014  Amy Dean 05/27/1965 [id number]  Amy Dean is an 49 y.o. female who presents for routine Link To Wellness follow for self management assistance of Type II DM, HTN and hyperlipidemia. Her coworker and friend Amy Dean accompanies her.  Subjective: Summar says she is frustrated because she has gained back the weight she lost and attributes it to regular soda consumption. Says she saw Pearson Forster NP on 12/10/14 and her A1C was 5.3% She says she has not been monitoring her blood sugar recently because she needs a new glucometer and will pick it up today.  Objective:   Review of Systems  Constitutional: Negative.    Filed Vitals:   12/14/14 0830  BP: 120/80   Filed Weights   12/14/14 0830  Weight: 191 lb (86.637 kg)   Physical Exam  Constitutional: She is oriented to person, place, and time. She appears well-developed and well-nourished.  Neurological: She is alert and oriented to person, place, and time.  Psychiatric: She has a normal mood and affect. Her behavior is normal. Judgment and thought content normal.    Current Medications:   Current Outpatient Prescriptions  Medication Sig Dispense Refill  . acetaminophen (TYLENOL) 500 MG tablet Take 500 mg by mouth as needed.    . ALPRAZolam (XANAX) 0.5 MG tablet Take 1 tablet (0.5 mg total) by mouth at bedtime as needed for anxiety or sleep. 30 tablet 2  . amLODipine (NORVASC) 5 MG tablet Take 1 tablet (5 mg total) by mouth daily. 90 tablet 0  . citalopram (CELEXA) 20 MG tablet 1 1/2 tabs po qd 135 tablet 1  . fexofenadine (ALLEGRA) 180 MG tablet Take 1 tablet (180 mg total) by mouth daily. 90 tablet 1  . fluticasone (FLONASE) 50 MCG/ACT nasal spray Place 2 sprays into both nostrils daily. 48 g 11  . Liraglutide 18 MG/3ML SOPN Inject 1.2 mg subcu once daily 6 pen 1  . lisinopril-hydrochlorothiazide (PRINZIDE,ZESTORETIC) 20-12.5 MG per tablet  TAKE 1 TABLET BY MOUTH DAILY FOR FOR BLOOD PRESSURE 30 tablet PRN  . metFORMIN (GLUCOPHAGE) 1000 MG tablet TAKE 1 TABLET (1,000 MG TOTAL) BY MOUTH 2 (TWO) TIMES DAILY WITH A MEAL. (Patient taking differently: TAKE 1/2 TABLET (1,000 MG TOTAL) BY MOUTH ONCE DAILY) 180 tablet 1  . naproxen sodium (ANAPROX) 220 MG tablet Take 220-440 mg by mouth daily as needed (for pain).    . pantoprazole (PROTONIX) 40 MG tablet TAKE 1 TABLET (40 MG TOTAL) BY MOUTH DAILY FOR ACID REFLUX 90 tablet 1  . pravastatin (PRAVACHOL) 20 MG tablet TAKE 1 TABLET BY MOUTH DAILY FOR CHOLESTEROL 30 tablet 0  . cyclobenzaprine (FLEXERIL) 10 MG tablet Take 1 tablet (10 mg total) by mouth 3 (three) times daily as needed for muscle spasms. (Patient not taking: Reported on 12/14/2014) 30 tablet 0  . Insulin Pen Needle (PEN NEEDLES 31GX5/16") 31G X 8 MM MISC Use daily as directed (Patient not taking: Reported on 12/14/2014) 100 each 11  . ONE TOUCH ULTRA TEST test strip USE TWICE DAILY AS DIRECTED 200 each PRN  . oxyCODONE-acetaminophen (PERCOCET) 10-325 MG per tablet Take 1 tablet by mouth every 4 (four) hours as needed for pain. Caution drowsiness (Patient not taking: Reported on 12/14/2014) 30 tablet 0   No current facility-administered medications for this visit.    Functional Status:   In your present state of health, do you have any difficulty performing the following activities: 08/31/2014  Hearing? N  Vision? N  Difficulty concentrating or making decisions? N  Walking or climbing stairs? N  Dressing or bathing? N  Doing errands, shopping? N    Fall/Depression Screening:    PHQ 2/9 Scores 08/31/2014  PHQ - 2 Score 0   THN CM Care Plan Problem One        Patient Outreach from 12/14/2014 in [place] Problem One  Type II DM meeting target A1C as evidenced by A1C= 5.3% on 12/10/14   Care Plan for Problem One  Active   THN Long Term Goal (31-90 days)  Ongoing evidence of good Type II DM management as  evidenced by meeting A1C target of <7.0% at each check   THN Long Term Goal Start Date  12/14/14   [facility] Long Term Goal Met Date  12/14/14   Interventions for Problem One Long Term Goal   reviewed basic pathophysiology of Type II DM, reviewed role of obesity on insulin resistance, suggested strategies to reduce consumption of regular soda and to lower triglycerides, reviewed patient's medications and the mechanism of action of Victoza and Metformin and their common side effects, reviewed effects of physical activity on insulin resistance and encouraged Tarra to do some form of exercise  that she enjoys, will schedule Link To Wellness follow up in 3-6 months     Assessment:   Nice employee and Link To Wellness member with Type II DM and meeting target A1C; with HTN and meeting treatment goals today; with hyperlipidemia with significantly elevated triglycerides.  Plan:  RNCM to fax today's office visit note to Pearson Forster NP. RNCM will meet quarterly and as needed with patient per Link To Wellness program guidelines to assist with Type II DM, HTN and hyperlipidemia self-management and assess patient's progress toward mutually set goals.  Barrington Ellison RN,CCM,CDE [facility] Management Coordinator Link To Wellness Office Phone [phone] Office Fax [phone]

## 2014-12-28 ENCOUNTER — Other Ambulatory Visit: Payer: Self-pay | Admitting: *Deleted

## 2014-12-28 MED ORDER — PRAVASTATIN SODIUM 20 MG PO TABS: ORAL_TABLET | ORAL | Status: DC

## 2015-01-04 ENCOUNTER — Other Ambulatory Visit: Payer: Self-pay | Admitting: Nurse Practitioner

## 2015-01-04 MED ORDER — VITAMIN D (ERGOCALCIFEROL) 1.25 MG (50000 UNIT) PO CAPS: 50000.0000 [IU] | ORAL_CAPSULE | ORAL | Status: DC

## 2015-01-06 ENCOUNTER — Telehealth: Payer: Self-pay | Admitting: Nurse Practitioner

## 2015-01-06 ENCOUNTER — Other Ambulatory Visit: Payer: Self-pay | Admitting: Nurse Practitioner

## 2015-01-06 MED ORDER — OMEGA-3-ACID ETHYL ESTERS 1 G PO CAPS: 1.0000 g | ORAL_CAPSULE | Freq: Two times a day (BID) | ORAL | Status: DC

## 2015-01-06 NOTE — Telephone Encounter (Signed)
Order sent in. Started on lower dose to make sure no problems. If tolerated, will increase over time.

## 2015-01-06 NOTE — Telephone Encounter (Signed)
Pt calling back today wanting to know if [name] would be willing to put her on  Lovaza (fish oil pill) out pt pharmacy generic brand

## 2015-01-07 NOTE — Telephone Encounter (Signed)
[facility] 01/07/15

## 2015-01-07 NOTE — Telephone Encounter (Signed)
[facility] 01/07/2015

## 2015-01-08 NOTE — Telephone Encounter (Signed)
Pt.notified

## 2015-01-12 ENCOUNTER — Ambulatory Visit (INDEPENDENT_AMBULATORY_CARE_PROVIDER_SITE_OTHER): Payer: 59 | Admitting: Nurse Practitioner

## 2015-01-12 ENCOUNTER — Encounter: Payer: Self-pay | Admitting: Nurse Practitioner

## 2015-01-12 VITALS — BP 134/88 | Temp 98.2°F | Ht 64.0 in | Wt 187.1 lb

## 2015-01-12 DIAGNOSIS — E781 Pure hyperglyceridemia: Secondary | ICD-10-CM | POA: Insufficient documentation

## 2015-01-12 DIAGNOSIS — R350 Frequency of micturition: Secondary | ICD-10-CM | POA: Diagnosis not present

## 2015-01-12 DIAGNOSIS — R319 Hematuria, unspecified: Secondary | ICD-10-CM

## 2015-01-12 LAB — POCT URINALYSIS DIPSTICK
Spec Grav, UA: 1.01
pH, UA: 5

## 2015-01-12 LAB — POCT UA - MICROSCOPIC ONLY
Bacteria, U Microscopic: NEGATIVE
RBC, urine, microscopic: NEGATIVE
WBC, Ur, HPF, POC: NEGATIVE

## 2015-01-12 MED ORDER — CIPROFLOXACIN HCL 500 MG PO TABS: 500.0000 mg | ORAL_TABLET | Freq: Two times a day (BID) | ORAL | Status: DC

## 2015-01-13 ENCOUNTER — Encounter: Payer: Self-pay | Admitting: Nurse Practitioner

## 2015-01-13 NOTE — Progress Notes (Signed)
Subjective:  Presents for complaints of left-sided mid back pain for about 3 days. Describes as a localized burning pain off-and-on. Pain has eased off. Noticed blood in her urine within the past 24 hours. At one point was a considerable amount of blood. No fever. No dysuria. Some frequency and urgency. No pelvic pain. No vaginal discharge. Same sexual partner. Mild constipation. No nausea or vomiting. Taking fluids well. No history of recent UTI.  Objective:   BP 134/88 mmHg  Temp(Src) 98.2 F (36.8 C) (Oral)  Ht 5\' 4"  (1.626 m)  Wt 187 lb 2 oz (84.879 kg)  BMI 32.10 kg/m2  LMP 02/10/2011 NAD. Alert, oriented. Lungs clear. Heart regular rate rhythm. Positive mild left CVA area tenderness. Abdomen mildly obese soft nondistended with minimal mid pelvic area tenderness. Results for orders placed or performed in visit on 01/12/15  POCT urinalysis dipstick  Result Value Ref Range   Color, UA Yellow    Clarity, UA Clear    Glucose, UA     Bilirubin, UA     Ketones, UA     Spec Grav, UA 1.010    Blood, UA small    pH, UA 5.0    Protein, UA     Urobilinogen, UA     Nitrite, UA     Leukocytes, UA small (1+) (A) Negative  POCT UA - Microscopic Only  Result Value Ref Range   WBC, Ur, HPF, POC neg    RBC, urine, microscopic neg    Bacteria, U Microscopic neg    Mucus, UA     Epithelial cells, urine per micros occas    Crystals, Ur, HPF, POC     Casts, Ur, LPF, POC     Yeast, UA       Assessment:  Problem List Items Addressed This Visit      Other   Hypertriglyceridemia    Other Visit Diagnoses    Hematuria    -  Primary    Relevant Orders    POCT urinalysis dipstick (Completed)    POCT UA - Microscopic Only (Completed)    Urine Culture    Urinary frequency        Relevant Orders    POCT UA - Microscopic Only (Completed)    Urine Culture      Plan:  Meds ordered this encounter  Medications  . DISCONTD: ciprofloxacin (CIPRO) 500 MG tablet    Sig: Take 1 tablet (500 mg  total) by mouth 2 (two) times daily.    Dispense:  14 tablet    Refill:  0    Order Specific Question:  Supervising Provider    Answer:  Mikey Kirschner [[id number]]  . ciprofloxacin (CIPRO) 500 MG tablet    Sig: Take 1 tablet (500 mg total) by mouth 2 (two) times daily.    Dispense:  14 tablet    Refill:  0    Order Specific Question:  Supervising Provider    Answer:  Mikey Kirschner [[id number]]   Another clean catch urine sample was obtained for culture. Start Cipro as directed. AZO as directed for 48 hours then discontinue. Reviewed signs and symptoms of renal stone. Call back in 72 hours if no improvement in symptoms, call or go to ED sooner if worse.

## 2015-01-14 LAB — URINE CULTURE

## 2015-01-27 ENCOUNTER — Encounter: Payer: Self-pay | Admitting: Nurse Practitioner

## 2015-01-27 ENCOUNTER — Ambulatory Visit (INDEPENDENT_AMBULATORY_CARE_PROVIDER_SITE_OTHER): Payer: 59 | Admitting: Nurse Practitioner

## 2015-01-27 VITALS — BP 128/84 | Temp 98.3°F | Ht 64.0 in | Wt 189.0 lb

## 2015-01-27 DIAGNOSIS — R319 Hematuria, unspecified: Secondary | ICD-10-CM

## 2015-01-27 DIAGNOSIS — M549 Dorsalgia, unspecified: Secondary | ICD-10-CM

## 2015-01-27 DIAGNOSIS — G548 Other nerve root and plexus disorders: Secondary | ICD-10-CM | POA: Diagnosis not present

## 2015-01-27 DIAGNOSIS — M792 Neuralgia and neuritis, unspecified: Secondary | ICD-10-CM

## 2015-01-27 LAB — POCT URINALYSIS DIPSTICK
Spec Grav, UA: 1.01
pH, UA: 6

## 2015-01-27 MED ORDER — GABAPENTIN 100 MG PO CAPS: 100.0000 mg | ORAL_CAPSULE | Freq: Two times a day (BID) | ORAL | Status: DC

## 2015-01-28 ENCOUNTER — Encounter: Payer: Self-pay | Admitting: Nurse Practitioner

## 2015-01-28 NOTE — Progress Notes (Signed)
Subjective:  Presents for recheck. See previous note. Continues to have random burning sensation in the left mid back area/CVA area. Occurs randomly. About once a day. Last about 30 minutes. Localized. 8 out of 10 on a pain scale. No fever. Worse with lying down. Better when she drinks warm fluid. Has not noticed any visible blood in her urine. No dysuria urgency or frequency.  Objective:   BP 128/84 mmHg  Temp(Src) 98.3 F (36.8 C) (Oral)  Ht 5\' 4"  (1.626 m)  Wt 189 lb (85.73 kg)  BMI 32.43 kg/m2  LMP 02/10/2011 NAD. Alert, oriented. Lungs clear. Heart regular rate rhythm. Mild tenderness to palpation of the left CVA area along a dermatomal line. Does not radiate into the flank area. Abdomen soft nondistended nontender. Urine culture from 01/12/2015 was negative. Results for orders placed or performed in visit on 01/27/15  POCT urinalysis dipstick  Result Value Ref Range   Color, UA     Clarity, UA     Glucose, UA     Bilirubin, UA ++    Ketones, UA     Spec Grav, UA 1.010    Blood, UA     pH, UA 6.0    Protein, UA     Urobilinogen, UA     Nitrite, UA     Leukocytes, UA  Negative     Assessment: Hematuria - Plan: POCT urinalysis dipstick, POCT UA - Microscopic Only, US Renal, CANCELED: US Renal  CVA tenderness  Neuropathic pain of chest  Plan:  Meds ordered this encounter  Medications  . gabapentin (NEURONTIN) 100 MG capsule    Sig: Take 1 capsule (100 mg total) by mouth 2 (two) times daily.    Dispense:  60 capsule    Refill:  2    Order Specific Question:  Supervising Provider    Answer:  Mikey Kirschner [[id number]]   Because of persistent pain in the area as well as recent hematuria, will obtain an ultrasound of the left renal area. Feel this may be neuropathic pain along the back area. Patient wishes to start medication, will start low dose gabapentin. Further follow-up based on ultrasound report. Warning signs reviewed. Call back sooner if any problems.

## 2015-01-29 ENCOUNTER — Other Ambulatory Visit: Payer: Self-pay | Admitting: Family Medicine

## 2015-02-01 ENCOUNTER — Other Ambulatory Visit: Payer: Self-pay | Admitting: Family Medicine

## 2015-02-01 DIAGNOSIS — Z1231 Encounter for screening mammogram for malignant neoplasm of breast: Secondary | ICD-10-CM

## 2015-02-03 ENCOUNTER — Other Ambulatory Visit: Payer: Self-pay | Admitting: *Deleted

## 2015-02-04 ENCOUNTER — Ambulatory Visit (HOSPITAL_COMMUNITY)
Admission: RE | Admit: 2015-02-04 | Discharge: 2015-02-04 | Disposition: A | Discharge status: Home / Self Care | Payer: 59 | Source: Ambulatory Visit | Attending: Nurse Practitioner | Admitting: Nurse Practitioner

## 2015-02-04 DIAGNOSIS — R319 Hematuria, unspecified: Secondary | ICD-10-CM | POA: Diagnosis not present

## 2015-02-04 DIAGNOSIS — R1032 Left lower quadrant pain: Secondary | ICD-10-CM | POA: Insufficient documentation

## 2015-02-04 DIAGNOSIS — Z87440 Personal history of urinary (tract) infections: Secondary | ICD-10-CM | POA: Insufficient documentation

## 2015-02-04 NOTE — Patient Outreach (Signed)
Amy Dean stopped by [place] [facility] office with her coworker Amy Dean to request this RNCM's assist to arrange for her to attend the Type II DM core classes with Amy Dean. Will send referral to the [facility] with request to enroll [facility] in the core class for [facility] members. Barrington Ellison RN,CCM,CDE [place] [facility] Coordinator [facility] Office Phone [phone] Office Fax [phone]

## 2015-02-09 ENCOUNTER — Ambulatory Visit: Payer: 59 | Admitting: Nurse Practitioner

## 2015-02-17 ENCOUNTER — Telehealth: Payer: Self-pay

## 2015-03-04 ENCOUNTER — Telehealth (INDEPENDENT_AMBULATORY_CARE_PROVIDER_SITE_OTHER): Payer: Self-pay | Admitting: *Deleted

## 2015-03-04 ENCOUNTER — Ambulatory Visit (INDEPENDENT_AMBULATORY_CARE_PROVIDER_SITE_OTHER): Payer: 59 | Admitting: Internal Medicine

## 2015-03-04 ENCOUNTER — Other Ambulatory Visit (INDEPENDENT_AMBULATORY_CARE_PROVIDER_SITE_OTHER): Payer: Self-pay | Admitting: Internal Medicine

## 2015-03-04 ENCOUNTER — Encounter (INDEPENDENT_AMBULATORY_CARE_PROVIDER_SITE_OTHER): Payer: Self-pay | Admitting: Internal Medicine

## 2015-03-04 VITALS — BP 124/70 | HR 72 | Temp 98.3°F | Ht 64.0 in | Wt 189.5 lb

## 2015-03-04 DIAGNOSIS — R935 Abnormal findings on diagnostic imaging of other abdominal regions, including retroperitoneum: Secondary | ICD-10-CM

## 2015-03-04 DIAGNOSIS — Z1211 Encounter for screening for malignant neoplasm of colon: Secondary | ICD-10-CM

## 2015-03-04 MED ORDER — PEG 3350-KCL-NA BICARB-NACL 420 G PO SOLR: 4000.0000 mL | Freq: Once | ORAL | Status: DC

## 2015-03-04 NOTE — Telephone Encounter (Signed)
Patient needs trilyte 

## 2015-03-04 NOTE — Progress Notes (Signed)
Subjective:    Patient ID: Amy Dean, female    DOB: 07-23-1965, 49 y.o.   MRN: [id number]  HPI Here today for f/u.  She was last seen in October of 2015. Hx of prominent peripancreatic and periportal lymph nodes. She underwent an EUS with FNA, which was negative for neoplastic process. She did not follow up with repeat EUS or repeat CT scan.   She did have a CT scan this year (please see below). She presented to the ED with left lower quadrant pain.   She tells me she is doing good. 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 Appetite is is good. She has gained about 4 pounds since her last visit in October of 2015. She denies any abdominal pain. No SOB or joint pain. BMs normal. Usually has a BM daily.  She is requesting a screening colonoscopy.       CBC    Component Value Date/Time   WBC 4.0 07/17/2014 1205   WBC 4.7 10/05/2013 1439   RBC 4.55 07/17/2014 1205   RBC 4.52 10/05/2013 1439   HGB 13.7 07/17/2014 1205   HGB 13.5 10/05/2013 1439   HCT 39.6 07/17/2014 1205   HCT 41.8 10/05/2013 1439   PLT 191 07/17/2014 1205   MCV 87.0 07/17/2014 1205   MCV 92.4 10/05/2013 1439   MCH 30.1 07/17/2014 1205   MCH 29.9 10/05/2013 1439   MCHC 34.6 07/17/2014 1205   MCHC 32.3 10/05/2013 1439   RDW 12.8 07/17/2014 1205   LYMPHSABS 1.5 07/17/2014 1205   MONOABS 0.5 07/17/2014 1205   EOSABS 0.2 07/17/2014 1205   BASOSABS 0.0 07/17/2014 1205    Hepatic Function Panel     Component Value Date/Time   PROT 7.4 12/12/2014 0814   PROT 7.3 07/17/2014 1205   ALBUMIN 4.6 12/12/2014 0814   ALBUMIN 4.2 07/17/2014 1205   AST 26 12/12/2014 0814   ALT 19 12/12/2014 0814   ALKPHOS 64 12/12/2014 0814   BILITOT 0.4 12/12/2014 0814   BILITOT 0.8 07/17/2014 1205   BILIDIR 0.13 12/12/2014 0814   BILIDIR 0.1 04/08/2014 0750   IBILI 0.5 04/08/2014 0750   Urinalysis    Component Value Date/Time   COLORURINE YELLOW 07/17/2014 1435   APPEARANCEUR CLEAR 07/17/2014  1435   LABSPEC 1.025 07/17/2014 1435   PHURINE 6.0 07/17/2014 1435   GLUCOSEU NEGATIVE 07/17/2014 1435   HGBUR NEGATIVE 07/17/2014 1435   BILIRUBINUR ++ 01/27/2015 1559   BILIRUBINUR NEGATIVE 07/17/2014 1435   KETONESUR NEGATIVE 07/17/2014 1435   PROTEINUR NEGATIVE 07/17/2014 1435   PROTEINUR neg 10/05/2013 1439   UROBILINOGEN 1.0 07/17/2014 1435   UROBILINOGEN 0.2 10/05/2013 1439   NITRITE NEGATIVE 07/17/2014 1435   NITRITE neg 10/05/2013 1439   LEUKOCYTESUR small (1+)* 01/12/2015 1607           07/17/2014 CT abdomen/pelvis w CM: left lower abdominal pain  IMPRESSION: No abdominal organ pathology.  Previous hysterectomy. Both ovaries identified. The left ovary is slightly larger than the right and there may be a small amount of adjacent fluid. Question 2.6 cm left ovarian cyst. This could possibly be a cause of left lower quadrant pain, though advanced or significant pathology is not suspected as this would be consistent with a functional cyst in a premenopausal female.   09/29/2011 CT abdomen/pelvis with CM:   1. Mild peripancreatic and retroperitoneal adenopathy with several   foci of stranding around mesenteric lymph nodes. This type of   stranding can  be seen in inflammatory/reactive adenopathy or   malignancy. Enteritis with secondary nodal inflammation could   possibly have this appearance. Follow-up imaging may be warranted   if the process does not follow a self-limited course.     08/05/2011 Upper GI series with small bowel follow thru was normal.   She underwent an EUS with FNA by Dr. Oretha Caprice 10/2011 . She had prominent peripancreatic and periportal lymph nodes. FNA was negative for neoplastic process. ACE level was also checked and was low.     Review of Systems Past Medical History  Diagnosis Date  . Hypertension   . Enteritis   . Diabetes mellitus   . Anemia     resolved post hysterectomy  . IFG (impaired fasting glucose)   . Dyslipidemia      Past Surgical History  Procedure Laterality Date  . Cesarean section  1982 and 1989    x2  . Tonsillectomy    . Hysteroscopy w/ endometrial ablation  07/22/2008    K.Harrington Challenger MD [facility]  . Tubal ligation    . Supracervical abdominal hysterectomy  03/21/2011    Procedure: HYSTERECTOMY SUPRACERVICAL ABDOMINAL;  Surgeon: Jonnie Kind, MD;  Location: [facility];  Service: Gynecology;;  . Abdominal hysterectomy    . Eus  10/26/2011    Procedure: UPPER ENDOSCOPIC ULTRASOUND (EUS) LINEAR;  Surgeon: Milus Banister, MD;  Location: [facility];  Service: Endoscopy;  Laterality: N/A;    No Known Allergies  Current Outpatient Prescriptions on File Prior to Visit  Medication Sig Dispense Refill  . acetaminophen (TYLENOL) 500 MG tablet Take 500 mg by mouth as needed.    . ALPRAZolam (XANAX) 0.5 MG tablet Take 1 tablet (0.5 mg total) by mouth at bedtime as needed for anxiety or sleep. 30 tablet 2  . amLODipine (NORVASC) 5 MG tablet Take 1 tablet (5 mg total) by mouth daily. 90 tablet 0  . citalopram (CELEXA) 20 MG tablet 1 1/2 tabs po qd 135 tablet 1  . cyclobenzaprine (FLEXERIL) 10 MG tablet Take 1 tablet (10 mg total) by mouth 3 (three) times daily as needed for muscle spasms. 30 tablet 0  . fluticasone (FLONASE) 50 MCG/ACT nasal spray Place 2 sprays into both nostrils daily. 48 g 11  . gabapentin (NEURONTIN) 100 MG capsule Take 1 capsule (100 mg total) by mouth 2 (two) times daily. 60 capsule 2  . Insulin Pen Needle (PEN NEEDLES 31GX5/16") 31G X 8 MM MISC Use daily as directed 100 each 11  . Liraglutide 18 MG/3ML SOPN Inject 1.2 mg subcu once daily 6 pen 1  . lisinopril-hydrochlorothiazide (PRINZIDE,ZESTORETIC) 20-12.5 MG per tablet TAKE 1 TABLET BY MOUTH DAILY FOR FOR BLOOD PRESSURE 30 tablet PRN  . metFORMIN (GLUCOPHAGE) 1000 MG tablet TAKE 1 TABLET (1,000 MG TOTAL) BY MOUTH 2 (TWO) TIMES DAILY WITH A MEAL. (Patient taking differently: TAKE 1/2 TABLET (1,000 MG TOTAL) BY MOUTH ONCE DAILY) 180 tablet  1  . naproxen sodium (ANAPROX) 220 MG tablet Take 220-440 mg by mouth daily as needed (for pain).    Marland Kitchen omega-3 acid ethyl esters (LOVAZA) 1 G capsule Take 1 capsule (1 g total) by mouth 2 (two) times daily. 180 capsule 1  . pantoprazole (PROTONIX) 40 MG tablet TAKE 1 TABLET (40 MG TOTAL) BY MOUTH DAILY FOR ACID REFLUX 90 tablet 1  . pravastatin (PRAVACHOL) 20 MG tablet TAKE 1 TABLET BY MOUTH DAILY FOR CHOLESTEROL 90 tablet 1  . VICTOZA 18 MG/3ML SOPN INJECT 1.2 MG SUBQ ONCE  DAILY 9 mL 3  . Vitamin D, Ergocalciferol, (DRISDOL) 50000 UNITS CAPS capsule Take 1 capsule (50,000 Units total) by mouth every 7 (seven) days. 4 capsule 1  . ONE TOUCH ULTRA TEST test strip USE TWICE DAILY AS DIRECTED 200 each PRN   No current facility-administered medications on file prior to visit.        Objective:   Physical Exam Blood pressure 124/70, pulse 72, temperature 98.3 F (36.8 C), height 5\' 4"  (1.626 m), weight 189 lb 8 oz (85.957 kg), last menstrual period 02/10/2011. Alert and oriented. Skin warm and dry. Oral mucosa is moist.   . Sclera anicteric, conjunctivae is pink. Thyroid not enlarged. No cervical lymphadenopathy. Lungs clear. Heart regular rate and rhythm.  Abdomen is soft. Bowel sounds are positive. No hepatomegaly. No abdominal masses felt. No tenderness.  No edema to lower extremities.         Assessment & Plan:  Prominent peripancreatic lymph nodes. She is doing good. Recent CT encouraging. The risks and benefits such as perforation, bleeding, and infection were reviewed with the patient and is agreeable. Screening colonoscopy.

## 2015-03-04 NOTE — Patient Instructions (Signed)
OV in 1 year.  

## 2015-03-08 ENCOUNTER — Other Ambulatory Visit: Payer: Self-pay | Admitting: Nurse Practitioner

## 2015-03-11 ENCOUNTER — Ambulatory Visit (HOSPITAL_COMMUNITY)
Admission: RE | Admit: 2015-03-11 | Discharge: 2015-03-11 | Disposition: A | Discharge status: Home / Self Care | Payer: 59 | Source: Ambulatory Visit | Attending: Family Medicine | Admitting: Family Medicine

## 2015-03-11 DIAGNOSIS — Z1231 Encounter for screening mammogram for malignant neoplasm of breast: Secondary | ICD-10-CM

## 2015-03-13 ENCOUNTER — Encounter: Payer: 59 | Attending: Family Medicine

## 2015-03-13 VITALS — Wt 192.7 lb

## 2015-03-13 DIAGNOSIS — E119 Type 2 diabetes mellitus without complications: Secondary | ICD-10-CM | POA: Diagnosis present

## 2015-03-13 DIAGNOSIS — Z713 Dietary counseling and surveillance: Secondary | ICD-10-CM | POA: Diagnosis not present

## 2015-03-13 NOTE — Progress Notes (Signed)
Patient was seen on 03/13/15 for the complete diabetes self-management series at the [facility]. This is a part of the [facility] [employer].  Handouts given during class include:  Living Well with Diabetes book  Carb Counting and Meal Planning book  Meal Plan Card  Carbohydrate guide  Meal planning worksheet  Low Sodium Flavoring Tips  The diabetes portion plate  Low Carbohydrate Snack Suggestions  A1c to eAG Conversion Chart  Diabetes Medications  Stress Management  Diabetes Recommended Care Schedule  Diabetes Success Plan  Core Class Satisfaction Survey  The following learning objectives were met by the patient during this course:  Describe diabetes  State some common risk factors for diabetes  Defines the role of glucose and insulin  Identifies type of diabetes and pathophysiology  Describe the relationship between diabetes and cardiovascular risk  State the members of the Healthcare Team  States the rationale for glucose monitoring  State when to test glucose  State their individual Target Range  State the importance of logging glucose readings  Describe how to interpret glucose readings  Identifies A1C target  Explain the correlation between A1c and eAG values  State symptoms and treatment of high blood glucose  State symptoms and treatment of low blood glucose  Explain proper technique for glucose testing  Identifies proper sharps disposal  Describe the role of different macronutrients on glucose  Explain how carbohydrates affect blood glucose  State what foods contain the most carbohydrates  Demonstrate carbohydrate counting  Demonstrate how to read Nutrition Facts food label  Describe effects of various fats on heart health  Describe the importance of good nutrition for health and healthy eating strategies  Describe techniques for managing your shopping, cooking and meal planning  List  strategies to follow meal plan when dining out  Describe the effects of alcohol on glucose and how to use it safely . State the amount of activity recommended for healthy living . Describe activities suitable for individual needs . Identify ways to regularly incorporate activity into daily life . Identify barriers to activity and ways to over come these barriers  Identify diabetes medications being personally used and their primary action for lowering glucose and possible side effects . Describe role of stress on blood glucose and develop strategies to address psychosocial issues . Identify diabetes complications and ways to prevent them  Explain how to manage diabetes during illness . Evaluate success in meeting personal goal . Establish 2-3 goals that they will plan to diligently work on until they return for the  31-monthfollow-up visit  Goals:   I will count my carb choices at most meals and snacks  I will eat less unhealthy fats by eating less fried foods  I will test my glucose at least 2 times a day, 5 days a week  Your patient has identified these potential barriers to change:  Motivation   Your patient has identified their diabetes self-care support plan as  Family Support  Plan: Follow up with [facility]

## 2015-03-29 ENCOUNTER — Other Ambulatory Visit: Payer: Self-pay | Admitting: *Deleted

## 2015-03-29 MED ORDER — LIRAGLUTIDE 18 MG/3ML ~~LOC~~ SOPN: PEN_INJECTOR | SUBCUTANEOUS | Status: DC

## 2015-04-22 ENCOUNTER — Encounter (HOSPITAL_COMMUNITY): Payer: Self-pay | Admitting: *Deleted

## 2015-04-22 ENCOUNTER — Encounter (HOSPITAL_COMMUNITY)
Admission: RE | Disposition: A | Discharge status: Home / Self Care | Payer: Self-pay | Source: Ambulatory Visit | Attending: Internal Medicine

## 2015-04-22 ENCOUNTER — Ambulatory Visit (HOSPITAL_COMMUNITY)
Admission: RE | Admit: 2015-04-22 | Discharge: 2015-04-22 | Disposition: A | Discharge status: Home / Self Care | Payer: 59 | Source: Ambulatory Visit | Attending: Internal Medicine | Admitting: Internal Medicine

## 2015-04-22 DIAGNOSIS — K648 Other hemorrhoids: Secondary | ICD-10-CM | POA: Insufficient documentation

## 2015-04-22 DIAGNOSIS — Z794 Long term (current) use of insulin: Secondary | ICD-10-CM | POA: Insufficient documentation

## 2015-04-22 DIAGNOSIS — I10 Essential (primary) hypertension: Secondary | ICD-10-CM | POA: Diagnosis not present

## 2015-04-22 DIAGNOSIS — Z1211 Encounter for screening for malignant neoplasm of colon: Secondary | ICD-10-CM | POA: Insufficient documentation

## 2015-04-22 DIAGNOSIS — Z90710 Acquired absence of both cervix and uterus: Secondary | ICD-10-CM | POA: Diagnosis not present

## 2015-04-22 DIAGNOSIS — E119 Type 2 diabetes mellitus without complications: Secondary | ICD-10-CM | POA: Insufficient documentation

## 2015-04-22 DIAGNOSIS — E785 Hyperlipidemia, unspecified: Secondary | ICD-10-CM | POA: Diagnosis not present

## 2015-04-22 DIAGNOSIS — Z79899 Other long term (current) drug therapy: Secondary | ICD-10-CM | POA: Diagnosis not present

## 2015-04-22 HISTORY — PX: COLONOSCOPY: SHX5424

## 2015-04-22 LAB — GLUCOSE, CAPILLARY: Glucose-Capillary: 85 mg/dL (ref 65–99)

## 2015-04-22 SURGERY — COLONOSCOPY
Anesthesia: Moderate Sedation

## 2015-04-22 MED ORDER — MIDAZOLAM HCL 5 MG/5ML IJ SOLN
INTRAMUSCULAR | Status: DC | PRN
  Administered 2015-04-22 (×3): 2 mg via INTRAVENOUS
  Administered 2015-04-22: 1 mg via INTRAVENOUS
  Administered 2015-04-22: 3 mg via INTRAVENOUS

## 2015-04-22 MED ORDER — MEPERIDINE HCL 50 MG/ML IJ SOLN
INTRAMUSCULAR | Status: AC
  Filled 2015-04-22: qty 1

## 2015-04-22 MED ORDER — SODIUM CHLORIDE 0.9 % IV SOLN
INTRAVENOUS | Status: DC
  Administered 2015-04-22: 12:00:00 via INTRAVENOUS

## 2015-04-22 MED ORDER — MEPERIDINE HCL 50 MG/ML IJ SOLN
INTRAMUSCULAR | Status: DC | PRN
  Administered 2015-04-22 (×2): 25 mg via INTRAVENOUS

## 2015-04-22 MED ORDER — MIDAZOLAM HCL 5 MG/5ML IJ SOLN
INTRAMUSCULAR | Status: AC
  Filled 2015-04-22: qty 10

## 2015-04-22 MED ORDER — STERILE WATER FOR IRRIGATION IR SOLN
Status: DC | PRN
  Administered 2015-04-22: 13:00:00

## 2015-04-22 NOTE — H&P (Signed)
Amy Dean is an 49 y.o. female.   Chief Complaint:  Patient is here for colonoscopy. HPI:  Patient is 49 year-old African-American female was here for screening colonoscopy. She denies abdominal pain change in bowel habits or rectal bleeding.   Family history is negative for CRC.  Past Medical History  Diagnosis Date  . Hypertension   . Enteritis   . Diabetes mellitus   . Anemia     resolved post hysterectomy  . IFG (impaired fasting glucose)   . Dyslipidemia   . Hyperlipidemia     Past Surgical History  Procedure Laterality Date  . Cesarean section  1982 and 1989    x2  . Tonsillectomy    . Hysteroscopy w/ endometrial ablation  07/22/2008    K.Harrington Challenger MD [facility]  . Tubal ligation    . Supracervical abdominal hysterectomy  03/21/2011    Procedure: HYSTERECTOMY SUPRACERVICAL ABDOMINAL;  Surgeon: Jonnie Kind, MD;  Location: [facility];  Service: Gynecology;;  . Abdominal hysterectomy    . Eus  10/26/2011    Procedure: UPPER ENDOSCOPIC ULTRASOUND (EUS) LINEAR;  Surgeon: Milus Banister, MD;  Location: [facility];  Service: Endoscopy;  Laterality: N/A;    Family History  Problem Relation Age of Onset  . Anesthesia problems Neg Hx   . Hypotension Neg Hx   . Malignant hyperthermia Neg Hx   . Pseudochol deficiency Neg Hx   . Healthy Son   . Sarcoidosis Mother   . Heart attack Paternal Uncle   . Hypertension Maternal Grandmother    Social History:  reports that she has never smoked. She has never used smokeless tobacco. She reports that she drinks alcohol. She reports that she does not use illicit drugs.  Allergies: No Known Allergies  Medications Prior to Admission  Medication Sig Dispense Refill  . lisinopril-hydrochlorothiazide (PRINZIDE,ZESTORETIC) 20-12.5 MG per tablet TAKE 1 TABLET BY MOUTH DAILY FOR FOR BLOOD PRESSURE 30 tablet PRN  . metFORMIN (GLUCOPHAGE) 1000 MG tablet TAKE 1 TABLET (1,000 MG TOTAL) BY MOUTH 2 (TWO) TIMES DAILY WITH A MEAL. (Patient taking  differently: TAKE 1/2 TABLET (1,000 MG TOTAL) BY MOUTH ONCE DAILY) 180 tablet 1  . omega-3 acid ethyl esters (LOVAZA) 1 G capsule Take 1 capsule (1 g total) by mouth 2 (two) times daily. 180 capsule 1  . pravastatin (PRAVACHOL) 20 MG tablet TAKE 1 TABLET BY MOUTH DAILY FOR CHOLESTEROL 90 tablet 1  . acetaminophen (TYLENOL) 500 MG tablet Take 500 mg by mouth as needed.    . ALPRAZolam (XANAX) 0.5 MG tablet Take 1 tablet (0.5 mg total) by mouth at bedtime as needed for anxiety or sleep. 30 tablet 2  . amLODipine (NORVASC) 5 MG tablet TAKE 1 TABLET BY MOUTH DAILY. 90 tablet 0  . citalopram (CELEXA) 20 MG tablet 1 1/2 tabs po qd 135 tablet 1  . cyclobenzaprine (FLEXERIL) 10 MG tablet Take 1 tablet (10 mg total) by mouth 3 (three) times daily as needed for muscle spasms. 30 tablet 0  . fluticasone (FLONASE) 50 MCG/ACT nasal spray Place 2 sprays into both nostrils daily. 48 g 11  . gabapentin (NEURONTIN) 100 MG capsule Take 1 capsule (100 mg total) by mouth 2 (two) times daily. 60 capsule 2  . Insulin Pen Needle (PEN NEEDLES 31GX5/16") 31G X 8 MM MISC Use daily as directed 100 each 11  . Liraglutide (VICTOZA) 18 MG/3ML SOPN INJECT 1.2 MG SUBQ ONCE DAILY 9 mL 0  . naproxen sodium (ANAPROX) 220 MG tablet  Take 220-440 mg by mouth daily as needed (for pain).    . ONE TOUCH ULTRA TEST test strip USE TWICE DAILY AS DIRECTED 200 each PRN  . pantoprazole (PROTONIX) 40 MG tablet TAKE 1 TABLET (40 MG TOTAL) BY MOUTH DAILY FOR ACID REFLUX 90 tablet 1  . polyethylene glycol-electrolytes (NULYTELY/GOLYTELY) 420 G solution Take 4,000 mLs by mouth once. 4000 mL 0  . Vitamin D, Ergocalciferol, (DRISDOL) 50000 UNITS CAPS capsule Take 1 capsule (50,000 Units total) by mouth every 7 (seven) days. 4 capsule 1    Results for orders placed or performed during the hospital encounter of 04/22/15 (from the past 48 hour(s))  Glucose, capillary     Status: None   Collection Time: 04/22/15 12:12 PM  Result Value Ref Range    Glucose-Capillary 85 65 - 99 mg/dL   No results found.  ROS  Blood pressure 136/77, pulse 72, temperature 97.6 F (36.4 C), temperature source Oral, resp. rate 11, height 5\' 4"  (1.626 m), weight 196 lb (88.905 kg), last menstrual period 02/10/2011, SpO2 100 %. Physical Exam  Constitutional: She appears well-developed and well-nourished.  HENT:  Mouth/Throat: Oropharynx is clear and moist.  Eyes: Conjunctivae are normal. No scleral icterus.  Neck: No thyromegaly present.  Cardiovascular: Normal rate, regular rhythm and normal heart sounds.   Respiratory: Effort normal and breath sounds normal.  GI: Soft. She exhibits no distension and no mass. There is no tenderness.  Musculoskeletal: She exhibits no edema.  Lymphadenopathy:    She has no cervical adenopathy.  Neurological: She is alert.  Skin: Skin is warm and dry.     Assessment/Plan  average risk screening colonoscopy.  Jere Bostrom U 04/22/2015, 12:41 PM

## 2015-04-22 NOTE — Discharge Instructions (Signed)
Resume usual medications and diet. ° No driving for 24 hours. ° Next screening exam in 10 years. ° °Colonoscopy, Care After °Refer to this sheet in the next few weeks. These instructions provide you with information on caring for yourself after your procedure. Your health care provider may also give you more specific instructions. Your treatment has been planned according to current medical practices, but problems sometimes occur. Call your health care provider if you have any problems or questions after your procedure. °WHAT TO EXPECT AFTER THE PROCEDURE  °After your procedure, it is typical to have the following: °· A small amount of blood in your stool. °· Moderate amounts of gas and mild abdominal cramping or bloating. °HOME CARE INSTRUCTIONS °· Do not drive, operate machinery, or sign important documents for 24 hours. °· You may shower and resume your regular physical activities, but move at a slower pace for the first 24 hours. °· Take frequent rest periods for the first 24 hours. °· Walk around or put a warm pack on your abdomen to help reduce abdominal cramping and bloating. °· Drink enough fluids to keep your urine clear or pale yellow. °· You may resume your normal diet as instructed by your health care provider. Avoid heavy or fried foods that are hard to digest. °· Avoid drinking alcohol for 24 hours or as instructed by your health care provider. °· Only take over-the-counter or prescription medicines as directed by your health care provider. °· If a tissue sample (biopsy) was taken during your procedure: °¨ Do not take aspirin or blood thinners for 7 days, or as instructed by your health care provider. °¨ Do not drink alcohol for 7 days, or as instructed by your health care provider. °¨ Eat soft foods for the first 24 hours. °SEEK MEDICAL CARE IF: °You have persistent spotting of blood in your stool 2-3 days after the procedure. °SEEK IMMEDIATE MEDICAL CARE IF: °· You have more than a small spotting of  blood in your stool. °· You pass large blood clots in your stool. °· Your abdomen is swollen (distended). °· You have nausea or vomiting. °· You have a fever. °· You have increasing abdominal pain that is not relieved with medicine. °  °This information is not intended to replace advice given to you by your health care provider. Make sure you discuss any questions you have with your health care provider. °  °Document Released: 12/07/2003 Document Revised: 02/12/2013 Document Reviewed: 12/30/2012 °Elsevier Interactive Patient Education ©2016 [employer] Inc[employer] ° °

## 2015-04-22 NOTE — Op Note (Signed)
COLONOSCOPY PROCEDURE REPORT  PATIENT:  Amy Dean  MR#:  [id number] Birthdate:  Jun 27, 1965, 50 y.o., female Endoscopist:  Dr. Rogene Houston, MD Referred By:  Dr. Sallee Lange, MD Procedure Date: 04/22/2015  Procedure:   Colonoscopy  Indications:  Patient is 49 year old African-American female who is undergoing average risk screening colonoscopy.  Informed Consent:  The procedure and risks were reviewed with the patient and informed consent was obtained.  Medications:  Demerol 50 mg IV Versed 10 mg IV  Description of procedure:  After a digital rectal exam was performed, that colonoscope was advanced from the anus through the rectum and colon to the area of the cecum, ileocecal valve and appendiceal orifice. The cecum was deeply intubated. These structures were well-seen and photographed for the record. From the level of the cecum and ileocecal valve, the scope was slowly and cautiously withdrawn. The mucosal surfaces were carefully surveyed utilizing scope tip to flexion to facilitate fold flattening as needed. The scope was pulled down into the rectum where a thorough exam including retroflexion was performed.  Findings:   Prep satisfactory.  normal mucosa of cecum, ascending colon, hepatic flexure, transverse colon, splenic flexure, descending and sigmoid colon.  Normal rectal mucosa.  Small hemorrhoids above the dentate line.   Therapeutic/Diagnostic Maneuvers Performed:   None  Complications:   none  EBL:  none  Cecal Withdrawal Time:   10 minutes  Impression:   Examination performed to cecum.  All internal hemorrhoids otherwise normal colonoscopy.  Recommendations:  Standard instructions given. Next screening exam in 10 years.  REHMAN,NAJEEB U  04/22/2015 1:23 PM  CC: Dr. Sallee Lange, MD & Dr. Rayne Du ref. provider found

## 2015-04-26 ENCOUNTER — Encounter (HOSPITAL_COMMUNITY): Payer: Self-pay | Admitting: Internal Medicine

## 2015-05-17 ENCOUNTER — Other Ambulatory Visit: Payer: Self-pay | Admitting: *Deleted

## 2015-05-17 NOTE — Patient Outreach (Addendum)
[facility]) Care Management   05/17/2015  JNIAH FISHBURNE 17-Sep-1965 Amy Dean  Amy Dean is an 50 y.o. female who presents to the [place] Management office for routine Link To Wellness follow up for self management assistance with Type II DM, HTN and hyperlipidemia.  Subjective:  Amy Dean says Amy Dean is doing well, has no complaints. Says Amy Dean is checking her blood sugar about 2-3 times weekly and the fasting variance is 128-135. Says Amy Dean had her screening colonoscopy on 12/15 and the results were normal and Amy Dean will have a repeat colonoscopy in 10 years. Amy Dean says it is difficult to avoid concentrated sweets and sugared drinks because her husband buys them and brings them in the house.  Objective:   Review of Systems  Constitutional: Negative.     Physical Exam  Constitutional: Amy Dean is oriented to person, place, and time. Amy Dean appears well-developed and well-nourished.  Respiratory: Effort normal.  Neurological: Amy Dean is alert and oriented to person, place, and time.  Skin: Skin is warm and dry.  Psychiatric: Amy Dean has a normal mood and affect. Her behavior is normal. Judgment and thought content normal.   Filed Weights   05/17/15 1111  Weight: 194 lb (87.998 kg)   Filed Vitals:   05/17/15 1111  BP: 126/60   Current Medications:   Current Outpatient Prescriptions  Medication Sig Dispense Refill  . acetaminophen (TYLENOL) 500 MG tablet Take 500 mg by mouth as needed.    . ALPRAZolam (XANAX) 0.5 MG tablet Take 1 tablet (0.5 mg total) by mouth at bedtime as needed for anxiety or sleep. 30 tablet 2  . amLODipine (NORVASC) 5 MG tablet TAKE 1 TABLET BY MOUTH DAILY. 90 tablet 0  . cholecalciferol (VITAMIN D) 1000 units tablet Take 1,000 Units by mouth daily.    . citalopram (CELEXA) 20 MG tablet 1 1/2 tabs po qd 135 tablet 1  . cyclobenzaprine (FLEXERIL) 10 MG tablet Take 1 tablet (10 mg total) by mouth 3 (three) times daily as  needed for muscle spasms. 30 tablet 0  . fluticasone (FLONASE) 50 MCG/ACT nasal spray Place 2 sprays into both nostrils daily. 48 g 11  . gabapentin (NEURONTIN) 100 MG capsule Take 1 capsule (100 mg total) by mouth 2 (two) times daily. 60 capsule 2  . Liraglutide (VICTOZA) 18 MG/3ML SOPN INJECT 1.2 MG SUBQ ONCE DAILY 9 mL 0  . lisinopril-hydrochlorothiazide (PRINZIDE,ZESTORETIC) 20-12.5 MG per tablet TAKE 1 TABLET BY MOUTH DAILY FOR FOR BLOOD PRESSURE 30 tablet PRN  . metFORMIN (GLUCOPHAGE) 1000 MG tablet TAKE 1 TABLET (1,000 MG TOTAL) BY MOUTH 2 (TWO) TIMES DAILY WITH A MEAL. (Patient taking differently: Takes 1000 mg daily) 180 tablet 1  . naproxen sodium (ANAPROX) 220 MG tablet Take 220-440 mg by mouth daily as needed (for pain).    Marland Kitchen omega-3 acid ethyl esters (LOVAZA) 1 G capsule Take 1 capsule (1 g total) by mouth 2 (two) times daily. 180 capsule 1  . ONE TOUCH ULTRA TEST test strip USE TWICE DAILY AS DIRECTED 200 each PRN  . pantoprazole (PROTONIX) 40 MG tablet TAKE 1 TABLET (40 MG TOTAL) BY MOUTH DAILY FOR ACID REFLUX 90 tablet 1  . pravastatin (PRAVACHOL) 20 MG tablet TAKE 1 TABLET BY MOUTH DAILY FOR CHOLESTEROL 90 tablet 1  . Insulin Pen Needle (PEN NEEDLES 31GX5/16") 31G X 8 MM MISC Use daily as directed 100 each 11  . Vitamin D, Ergocalciferol, (DRISDOL) 50000 UNITS CAPS capsule Take 1 capsule (  50,000 Units total) by mouth every 7 (seven) days. (Patient not taking: Reported on 05/17/2015) 4 capsule 1   No current facility-administered medications for this visit.    Functional Status:   In your present state of health, do you have any difficulty performing the following activities: 05/17/2015 12/14/2014  Hearing? N N  Vision? N N  Difficulty concentrating or making decisions? - N  Walking or climbing stairs? - N  Dressing or bathing? - N  Doing errands, shopping? - N    Fall/Depression Screening:    PHQ 2/9 Scores 03/13/2015 08/31/2014  PHQ - 2 Score - 0  Exception Documentation  Patient refusal -    Assessment:   [employer] employee and Link To Wellness member currently meeting treatment targets for Hgb A1C ( 5.3% on 12/10/14) and BP but with elevated triglycerides (323) and slightly low HDL (34) on lipid profile done 12/12/14.  Plan:   [facility] CM Care Plan Problem One        Most Recent Value   Care Plan Problem One  Type II DM, HTN and hyperlipidemia- meeting target Hgb A1C as evidenced by Hgb A1C= 5.3% on 12/10/14, meeting BP targets, lipid profile of 12/12/14 shows elevated triglycerides (323) and slightly low HDL (34)   Role Documenting the Problem One  Care Management [place] for Problem One  Active   THN Long Term Goal (31-90 days)  Ongoing evidence of good Type II DM and HTN management as evidenced by meeting Hgb A1C target of <7.0% at each check and BP readings consistently <140/<90. Improved control of hyperlipidemia as evidenced by improvement in lipid profile at next check   [facility] Long Term Goal Start Date  05/17/15   Interventions for Problem One Long Term Goal  discussed updates to health history including the results of her colonoscopy done 04/22/15 and congratulated Amy Dean on completing the procedure, reviewed basic pathophysiology of Type II DM, reviewed lipid profile results of 12/12/14 and discussed foods and drink that tend to increase triglycerides, suggested strategies to reduce consumption of regular soda and concentrated sweets and saturated fats to help in lowering triglycerides, reviewed patient's medications and the mechanism of action of Victoza and Metformin and their common side effects, discussed CBG readings and reviewed CBG targets for pre and post meal, reviewed effects of physical activity on insulin resistance and encouraged Amy Dean to do some form of exercise  that Amy Dean enjoys, reviewed 's Healthy Rewards Program and encouraged Amy Dean to participate, reviewed upcoming appointments with R.D. on 06/30/15,  will schedule Link To  Wellness follow up in 3-6 months      RNCM to fax today's office visit note to Dr. Wolfgang Phoenix. RNCM will meet quarterly and as needed with patient per Link To Wellness program guidelines to assist with Type II DM, HTN and hyperlipidemia self-management and assess patient's progress toward mutually set goals.  Barrington Ellison RN,CCM,CDE [place] Management Coordinator Link To Wellness Office Phone [phone] Office Fax [phone]

## 2015-05-18 MED FILL — VICTOZA 18 MG/3 ML INJECT P: 18 | 45 days supply | Qty: 9 | Fill #0

## 2015-05-18 MED FILL — CITALOPRAM HBR 20 MG TABLET: 20 | 90 days supply | Qty: 135 | Fill #1

## 2015-05-19 MED FILL — ALPRAZolam 0.5 MG TABS: 0.5 | 30 days supply | Qty: 30 | Fill #1

## 2015-06-03 ENCOUNTER — Other Ambulatory Visit: Payer: Self-pay | Admitting: Nurse Practitioner

## 2015-06-03 MED FILL — metFORMIN HCL 1000 MG TABS: 1000 | 30 days supply | Qty: 60 | Fill #0

## 2015-06-07 ENCOUNTER — Other Ambulatory Visit: Payer: Self-pay | Admitting: Family Medicine

## 2015-06-07 MED FILL — AMLODIPINE BESYLATE 5 MG TA: 5 | 30 days supply | Qty: 30 | Fill #0

## 2015-06-07 MED FILL — UNIFINE PENTIPS 32GX5/32: 32G X 4 MM | 90 days supply | Qty: 100 | Fill #2

## 2015-06-14 ENCOUNTER — Other Ambulatory Visit: Payer: Self-pay | Admitting: Family Medicine

## 2015-06-14 MED FILL — PANTOPRAZOLE SOD DR 40 MG T: 40 | 90 days supply | Qty: 90 | Fill #0

## 2015-06-30 ENCOUNTER — Ambulatory Visit: Payer: Self-pay

## 2015-07-01 ENCOUNTER — Other Ambulatory Visit: Payer: Self-pay | Admitting: *Deleted

## 2015-07-01 MED ORDER — PRAVASTATIN SODIUM 20 MG PO TABS: ORAL_TABLET | ORAL | Status: DC

## 2015-07-01 MED FILL — PRAVASTATIN SODIUM 20 MG TA: 20 | 30 days supply | Qty: 30 | Fill #0

## 2015-07-12 ENCOUNTER — Ambulatory Visit (INDEPENDENT_AMBULATORY_CARE_PROVIDER_SITE_OTHER): Payer: 59 | Admitting: Family Medicine

## 2015-07-12 ENCOUNTER — Encounter: Payer: Self-pay | Admitting: Family Medicine

## 2015-07-12 VITALS — BP 138/80 | Ht 64.0 in | Wt 195.2 lb

## 2015-07-12 DIAGNOSIS — E119 Type 2 diabetes mellitus without complications: Secondary | ICD-10-CM

## 2015-07-12 DIAGNOSIS — I10 Essential (primary) hypertension: Secondary | ICD-10-CM | POA: Diagnosis not present

## 2015-07-12 DIAGNOSIS — E781 Pure hyperglyceridemia: Secondary | ICD-10-CM | POA: Diagnosis not present

## 2015-07-12 DIAGNOSIS — E785 Hyperlipidemia, unspecified: Secondary | ICD-10-CM | POA: Diagnosis not present

## 2015-07-12 DIAGNOSIS — Z23 Encounter for immunization: Secondary | ICD-10-CM | POA: Diagnosis not present

## 2015-07-12 LAB — POCT GLYCOSYLATED HEMOGLOBIN (HGB A1C): Hemoglobin A1C: 5

## 2015-07-12 MED ORDER — CETIRIZINE HCL 10 MG PO TABS: 10.0000 mg | ORAL_TABLET | Freq: Every day | ORAL | Status: DC

## 2015-07-12 MED ORDER — METFORMIN HCL 500 MG PO TABS: 500.0000 mg | ORAL_TABLET | Freq: Two times a day (BID) | ORAL | Status: DC

## 2015-07-12 MED ORDER — FEXOFENADINE HCL 180 MG PO TABS: 180.0000 mg | ORAL_TABLET | Freq: Every day | ORAL | Status: DC

## 2015-07-12 MED ORDER — CITALOPRAM HYDROBROMIDE 20 MG PO TABS: ORAL_TABLET | ORAL | Status: DC

## 2015-07-12 MED FILL — metFORMIN HCL 500 MG TABS: 500 | 90 days supply | Qty: 180 | Fill #0

## 2015-07-12 NOTE — Progress Notes (Signed)
   Subjective:    Patient ID: Amy Dean, female    DOB: May 04, 1966, 50 y.o.   MRN: [id number]  Diabetes She presents for her follow-up diabetic visit. She has type 2 diabetes mellitus. There are no hypoglycemic associated symptoms. Pertinent negatives for hypoglycemia include no confusion. There are no diabetic associated symptoms. Pertinent negatives for diabetes include no chest pain, no fatigue, no polydipsia, no polyphagia and no weakness. There are no hypoglycemic complications. There are no diabetic complications. There are no known risk factors for coronary artery disease. Current diabetic treatment includes oral agent (monotherapy). She is compliant with treatment all of the time.   Patient need refill on amlodipine. Patient states she is running short on blood pressure medicine she does try to eat healthy does try to stay physically active. Patient understands importance of a low-fat diet. She does take her cholesterol medicine on a regular basis area Patient needs something prescribed for her allergies.  We discussed her allergies. Discussed various approaches. Patient relates current medications not doing quite enough. Results for orders placed or performed in visit on 07/12/15  POCT glycosylated hemoglobin (Hb A1C)  Result Value Ref Range   Hemoglobin A1C 5.0      Review of Systems  Constitutional: Negative for activity change, appetite change and fatigue.  HENT: Negative for congestion.   Respiratory: Negative for cough.   Cardiovascular: Negative for chest pain.  Gastrointestinal: Negative for abdominal pain.  Endocrine: Negative for polydipsia and polyphagia.  Neurological: Negative for weakness.  Psychiatric/Behavioral: Negative for confusion.       Objective:   Physical Exam  Constitutional: She appears well-nourished. No distress.  Cardiovascular: Normal rate, regular rhythm and normal heart sounds.   No murmur heard. Pulmonary/Chest: Effort normal and  breath sounds normal. No respiratory distress.  Musculoskeletal: She exhibits no edema.  Lymphadenopathy:    She has no cervical adenopathy.  Neurological: She is alert. She exhibits normal muscle tone.  Psychiatric: Her behavior is normal.  Vitals reviewed.   Diabetic foot exam normal      Assessment & Plan:   diabetes-exceptionally good control. May reduce metformin. Nail new dose 500 mg every morning and every supper watch diet continue Victoza    Xanax-patient does use medications sometimes at night help her sleep which is fine   significant allergies-continue Flonase. In addition to this OTC 0 tach or Allegra   Hyperlipidemia check lipid liver profile. Continue medication   acid reflux-paced states she takes her medicine on a regular basis keeps her symptoms under control   moods are doing well taking Celexa 20 mg one half tablet daily   pneumonia vaccine given today  urine ACR ordered.  Follow-up in 6 months sooner if any problems

## 2015-07-14 ENCOUNTER — Telehealth: Payer: Self-pay | Admitting: Family Medicine

## 2015-07-14 MED ORDER — AMLODIPINE BESYLATE 5 MG PO TABS: 5.0000 mg | ORAL_TABLET | Freq: Every day | ORAL | Status: DC

## 2015-07-14 MED FILL — AMLODIPINE BESYLATE 5 MG TA: 5 | 90 days supply | Qty: 90 | Fill #0

## 2015-07-14 NOTE — Telephone Encounter (Signed)
Pt is requesting refills on her amLODipine (NORVASC) 5 MG tablet. Pt was seen recently.      [facility] - [place] - [address].

## 2015-07-14 NOTE — Telephone Encounter (Signed)
TCNA (medication sent into pharmacy per protocol)

## 2015-07-14 NOTE — Telephone Encounter (Signed)
Patient notified

## 2015-07-16 ENCOUNTER — Other Ambulatory Visit: Payer: Self-pay | Admitting: Family Medicine

## 2015-07-16 MED FILL — ALPRAZolam 0.5 MG TABS: 0.5 | 30 days supply | Qty: 30 | Fill #0

## 2015-07-16 MED FILL — LISINOPRIL-HCTZ 20-12.5 MG: 20-12.5 | 90 days supply | Qty: 90 | Fill #3

## 2015-07-16 NOTE — Telephone Encounter (Signed)
This and 2 refills 

## 2015-07-17 DIAGNOSIS — I10 Essential (primary) hypertension: Secondary | ICD-10-CM | POA: Diagnosis not present

## 2015-07-17 DIAGNOSIS — E781 Pure hyperglyceridemia: Secondary | ICD-10-CM | POA: Diagnosis not present

## 2015-07-17 DIAGNOSIS — E785 Hyperlipidemia, unspecified: Secondary | ICD-10-CM | POA: Diagnosis not present

## 2015-07-17 DIAGNOSIS — E119 Type 2 diabetes mellitus without complications: Secondary | ICD-10-CM | POA: Diagnosis not present

## 2015-07-18 LAB — MICROALBUMIN / CREATININE URINE RATIO
Creatinine, Urine: 194.5 mg/dL
MICROALB/CREAT RATIO: 4.8 mg/g creat (ref 0.0–30.0)
Microalbumin, Urine: 9.4 ug/mL

## 2015-07-18 LAB — LIPID PANEL
Chol/HDL Ratio: 5.8 ratio units — ABNORMAL HIGH (ref 0.0–4.4)
Cholesterol, Total: 140 mg/dL (ref 100–199)
HDL: 24 mg/dL — ABNORMAL LOW (ref >39)
LDL Calculated: 78 mg/dL (ref 0–99)
Triglycerides: 189 mg/dL — ABNORMAL HIGH (ref 0–149)
VLDL Cholesterol Cal: 38 mg/dL (ref 5–40)

## 2015-07-18 LAB — HEPATIC FUNCTION PANEL
ALT: 22 IU/L (ref 0–32)
AST: 23 IU/L (ref 0–40)
Albumin: 4.2 g/dL (ref 3.5–5.5)
Alkaline Phosphatase: 78 IU/L (ref 39–117)
Bilirubin Total: 0.5 mg/dL (ref 0.0–1.2)
Bilirubin, Direct: 0.13 mg/dL (ref 0.00–0.40)
Total Protein: 6.9 g/dL (ref 6.0–8.5)

## 2015-07-18 LAB — BASIC METABOLIC PANEL
BUN/Creatinine Ratio: 14 (ref 9–23)
BUN: 10 mg/dL (ref 6–24)
CO2: 23 mmol/L (ref 18–29)
Calcium: 9.7 mg/dL (ref 8.7–10.2)
Chloride: 104 mmol/L (ref 96–106)
Creatinine, Ser: 0.74 mg/dL (ref 0.57–1.00)
GFR calc Af Amer: 108 mL/min/{1.73_m2} (ref >59)
GFR calc non Af Amer: 94 mL/min/{1.73_m2} (ref >59)
Glucose: 116 mg/dL — ABNORMAL HIGH (ref 65–99)
Potassium: 4 mmol/L (ref 3.5–5.2)
Sodium: 143 mmol/L (ref 134–144)

## 2015-07-27 ENCOUNTER — Other Ambulatory Visit: Payer: Self-pay | Admitting: Family Medicine

## 2015-07-27 MED FILL — PRAVASTATIN SODIUM 20 MG TA: 20 | 30 days supply | Qty: 30 | Fill #0

## 2015-07-28 ENCOUNTER — Other Ambulatory Visit: Payer: Self-pay | Admitting: *Deleted

## 2015-07-28 NOTE — Patient Outreach (Signed)
[facility]) Care Dean   07/28/2015  Amy Dean Oct 02, 1965 [id number]  Amy Dean is an 50 y.o. female who presents to the Amy Dean Amy Dean office to schedule a follow up Amy Dean appointment and to discuss results of recent labs drawn on 07/17/15 after she saw Amy Dean on 07/12/15.   Subjective:  Amy Dean says her Hgb A1C was 5.0% when it was checked in Amy Dean office on 07/12/15. She said she had additional lab work drawn on 07/17/15 and she would like this RNCM to review the results with her. She is also requesting to make a follow up Amy Dean appointment.  Objective:   Review of Systems  Constitutional: Negative.     Physical Exam  Constitutional: She is oriented to person, place, and time. She appears well-developed and well-nourished.  Respiratory: Effort normal.  Neurological: She is alert and oriented to person, place, and time.  Psychiatric: She has a normal mood and affect. Her behavior is normal. Judgment and thought content normal.    Current Medications:   Current Outpatient Prescriptions  Medication Sig Dispense Refill  . acetaminophen (TYLENOL) 500 MG tablet Take 500 mg by mouth as needed. Reported on 07/12/2015    . ALPRAZolam (XANAX) 0.5 MG tablet TAKE 1 TABLET BY MOUTH AT BEDTIME AS NEEDED FOR ANXIETY OR SLEEP 30 tablet 2  . amLODipine (NORVASC) 5 MG tablet Take 1 tablet (5 mg total) by mouth daily. 90 tablet 1  . cetirizine (ZYRTEC) 10 MG tablet Take 1 tablet (10 mg total) by mouth daily. 30 tablet 5  . cholecalciferol (VITAMIN D) 1000 units tablet Take 1,000 Units by mouth daily.    . citalopram (CELEXA) 20 MG tablet Take 1/2 tab po qd 45 tablet 1  . fexofenadine (ALLEGRA) 180 MG tablet Take 1 tablet (180 mg total) by mouth daily. 30 tablet 5  . fluticasone (FLONASE) 50 MCG/ACT nasal spray Place 2 sprays into both nostrils daily. (Patient not taking: Reported on 07/12/2015)  48 g 11  . Insulin Pen Needle (PEN NEEDLES 31GX5/16") 31G X 8 MM MISC Use daily as directed 100 each 11  . Liraglutide (VICTOZA) 18 MG/3ML SOPN INJECT 1.2 MG SUBQ ONCE DAILY 9 mL 0  . lisinopril-hydrochlorothiazide (PRINZIDE,ZESTORETIC) 20-12.5 MG per tablet TAKE 1 TABLET BY MOUTH DAILY FOR FOR BLOOD PRESSURE 30 tablet PRN  . metFORMIN (GLUCOPHAGE) 500 MG tablet Take 1 tablet (500 mg total) by mouth 2 (two) times daily with a meal. 180 tablet 1  . naproxen sodium (ANAPROX) 220 MG tablet Take 220-440 mg by mouth daily as needed (for pain).    Marland Kitchen omega-3 acid ethyl esters (LOVAZA) 1 G capsule Take 1 capsule (1 g total) by mouth 2 (two) times daily. 180 capsule 1  . ONE TOUCH ULTRA TEST test strip USE TWICE DAILY AS DIRECTED 200 each PRN  . pantoprazole (PROTONIX) 40 MG tablet TAKE 1 TABLET BY MOUTH ONCE DAILY FOR ACID REFLUX 90 tablet 0  . pravastatin (PRAVACHOL) 20 MG tablet TAKE 1 TABLET BY MOUTH DAILY FOR CHOLESTEROL 30 tablet 5  . Vitamin D, Ergocalciferol, (DRISDOL) 50000 UNITS CAPS capsule Take 1 capsule (50,000 Units total) by mouth every 7 (seven) days. (Patient not taking: Reported on 05/17/2015) 4 capsule 1   No current facility-administered medications for this visit.    Functional Status:   In your present state of health, do you have any difficulty performing the following activities: 05/17/2015 12/14/2014  Hearing?  N N  Vision? N N  Difficulty concentrating or making decisions? - N  Walking or climbing stairs? - N  Dressing or bathing? - N  Doing errands, shopping? 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  Plan:  Amy Dean CM Care Plan Problem One        Most  Recent Value   Care Plan Problem One  Type II DM, HTN and hyperlipidemia- meeting target Hgb A1C as evidenced by Hgb A1C= 5.0% on 07/12/15, meeting BP targets, lipid profile of 07/17/15 shows improvement in elevated triglycerides (from 323 to 189) and increasingly low HDL (from 34 to 24) on 07/17/15   Role Documenting the Problem One  Care Dean Amy Dean for Problem One  Active   Amy Dean (31-90 days)  Ongoing evidence of good Type II DM and HTN Dean as evidenced by meeting Hgb A1C target of <7.0% at each check and BP readings consistently <140/<90. Improved control of hyperlipidemia as evidenced by normal lipid profile at next check   Amy Dean Long Term Dean Start Date  07/28/15   Amy Dean Long Term Dean Met Date     Interventions for Problem One Long Term Dean  reviewed the results of all lab work drawn on 3/11 including Amy Dean note to [place] about her lab work, reviewed in detail the lipid profile results and congratulated her on the significant improvement in her triglycerides, reviewed the targets for triglycerides and HDL and provided a handout on strategies to improve HDL and triglycerides, reviewed upcoming appointment with Amy Forster NP in September, scheduled Amy Dean follow up in June      Amy Ellison RN,CCM,CDE [place] Dean Coordinator Amy Dean Office Phone [phone] Office Fax [phone]

## 2015-08-03 MED FILL — VICTOZA 18 MG/3 ML INJECT P: 18 | 45 days supply | Qty: 9 | Fill #1

## 2015-08-09 ENCOUNTER — Ambulatory Visit: Payer: 59 | Admitting: Family Medicine

## 2015-09-08 ENCOUNTER — Other Ambulatory Visit: Payer: Self-pay | Admitting: *Deleted

## 2015-09-08 MED ORDER — OMEGA-3-ACID ETHYL ESTERS 1 G PO CAPS: 1.0000 g | ORAL_CAPSULE | Freq: Two times a day (BID) | ORAL | Status: DC

## 2015-09-08 MED FILL — ALPRAZolam 0.5 MG TABS: 0.5 | 30 days supply | Qty: 30 | Fill #1

## 2015-09-08 MED FILL — PRAVASTATIN SODIUM 20 MG TA: 20 | 30 days supply | Qty: 30 | Fill #1

## 2015-09-08 MED FILL — OMEGA-3 ETHYL ESTERS 1 GM C: 1 | 90 days supply | Qty: 180 | Fill #0

## 2015-10-11 ENCOUNTER — Ambulatory Visit: Payer: Self-pay | Admitting: *Deleted

## 2015-10-18 MED FILL — PRAVASTATIN SODIUM 20 MG TA: 20 | 30 days supply | Qty: 30 | Fill #2

## 2015-10-18 MED FILL — AMLODIPINE BESYLATE 5 MG TA: 5 | 90 days supply | Qty: 90 | Fill #1

## 2015-10-26 ENCOUNTER — Encounter: Payer: Self-pay | Admitting: Family Medicine

## 2015-10-26 ENCOUNTER — Ambulatory Visit (INDEPENDENT_AMBULATORY_CARE_PROVIDER_SITE_OTHER): Payer: 59 | Admitting: Family Medicine

## 2015-10-26 VITALS — BP 124/88 | Temp 98.8°F | Ht 64.0 in | Wt 195.0 lb

## 2015-10-26 DIAGNOSIS — B9689 Other specified bacterial agents as the cause of diseases classified elsewhere: Secondary | ICD-10-CM

## 2015-10-26 DIAGNOSIS — H60392 Other infective otitis externa, left ear: Secondary | ICD-10-CM

## 2015-10-26 DIAGNOSIS — J019 Acute sinusitis, unspecified: Secondary | ICD-10-CM | POA: Diagnosis not present

## 2015-10-26 MED ORDER — CIPROFLOXACIN HCL 500 MG PO TABS: 500.0000 mg | ORAL_TABLET | Freq: Two times a day (BID) | ORAL | Status: DC

## 2015-10-26 MED ORDER — HYDROCODONE-ACETAMINOPHEN 5-325 MG PO TABS: 1.0000 | ORAL_TABLET | Freq: Four times a day (QID) | ORAL | Status: DC | PRN

## 2015-10-26 MED ORDER — NEOMYCIN-POLYMYXIN-HC 3.5-10000-1 OT SOLN: 4.0000 [drp] | Freq: Four times a day (QID) | OTIC | Status: DC

## 2015-10-26 NOTE — Progress Notes (Signed)
   Subjective:    Patient ID: Amy Dean, female    DOB: 1966-04-06, 50 y.o.   MRN: [id number]  Otalgia  There is pain in the left ear. This is a new problem. Episode onset: 2.5 days. Associated symptoms include coughing, headaches and a sore throat. Associated symptoms comments: Runny nose. Treatments tried: excedrin. The treatment provided mild relief.    PMH benign patient relates recently having some head congestion drainage discolored phlegm denies high fever chills sweats  Review of Systems  HENT: Positive for ear pain and sore throat.   Respiratory: Positive for cough.   Neurological: Positive for headaches.   I do not feel this is malignant externa-patient was encouraged to follow-up in 48 hours if not improving    Objective:   Physical Exam [place] region around the head nontender Throat nonerythematous moderate sinus tenderness maxillary sinus also some swelling in the outer ear canal left side compared to right side with tenderness with pressure and pulling on it. Neck no masses lungs are clear no crackles heart regular      Assessment & Plan:  Viral syndrome Secondary rhinosinusitis Otitis externa Ear drops along with antibiotics prescribed Pain medication as needed caution drowsiness If ongoing troubles or worse follow-up

## 2015-10-27 ENCOUNTER — Other Ambulatory Visit: Payer: Self-pay | Admitting: Nurse Practitioner

## 2015-10-27 ENCOUNTER — Other Ambulatory Visit: Payer: Self-pay | Admitting: Family Medicine

## 2015-10-27 MED FILL — CITALOPRAM HBR 20 MG TABLET: 20 | 90 days supply | Qty: 135 | Fill #0

## 2015-10-27 MED FILL — VICTOZA 18 MG/3 ML INJECT P: 18 | 45 days supply | Qty: 9 | Fill #2

## 2015-10-27 MED FILL — LISINOPRIL-HCTZ 20-12.5 MG: 20-12.5 | 90 days supply | Qty: 90 | Fill #0

## 2015-10-27 MED FILL — ALPRAZolam 0.5 MG TABS: 0.5 | 30 days supply | Qty: 30 | Fill #2

## 2015-11-10 MED FILL — PRAVASTATIN SODIUM 20 MG TA: 20 | 30 days supply | Qty: 30 | Fill #3

## 2015-12-06 ENCOUNTER — Encounter: Payer: Self-pay | Admitting: *Deleted

## 2015-12-06 ENCOUNTER — Other Ambulatory Visit: Payer: Self-pay | Admitting: *Deleted

## 2015-12-06 NOTE — Patient Outreach (Signed)
[facility]) Care Management   12/06/2015  Amy Dean [birth year] [id number]  Amy Dean is an 50 y.o. female who presents to the [place] Management office to enroll in the Link To Wellness program for self management assistance with Type II DM. HTN, hyperlipidemia and obesity.  Subjective: Amy Dean says she is doing well, still worries about her 45 year old son who is married and lives in [place] and recently bought a motorcycle against her wishes.  She says she saw Dr. Wolfgang Phoenix on 3/6 for follow up and her Hgb A1C was 5.0% so her dose of Metformin was reduced. She say she has not been checking her blood sugars because she misplaced her glucometer.  She also saw Dr. Wolfgang Phoenix in June for a sinus and ear infection and was treated with antibiotics and ear drop with resolution of symptoms.  She will see Dr. Wolfgang Phoenix again for routine follow up in September.   Objective:   Review of Systems  Constitutional: Negative.     Physical Exam  Constitutional: She is oriented to person, place, and time. She appears well-developed and well-nourished.  Respiratory: Effort normal.  Neurological: She is alert and oriented to person, place, and time.  Skin: Skin is warm and dry.  Psychiatric: She has a normal mood and affect. Her behavior is normal. Judgment and thought content normal.   Vitals:   12/06/15 0938  BP: 140/70   Filed Weights   12/06/15 0938  Weight: 196 lb (88.9 kg)   Encounter Medications:   Outpatient Encounter Prescriptions as of 12/06/2015  Medication Sig Note  . ALPRAZolam (XANAX) 0.5 MG tablet TAKE 1 TABLET BY MOUTH AT BEDTIME AS NEEDED FOR ANXIETY OR SLEEP   . amLODipine (NORVASC) 5 MG tablet Take 1 tablet (5 mg total) by mouth daily.   . cetirizine (ZYRTEC) 10 MG tablet Take 1 tablet (10 mg total) by mouth daily. 12/06/2015: Takes as needed  . cholecalciferol (VITAMIN D) 1000 units tablet Take 1,000 Units by  mouth daily.   . citalopram (CELEXA) 20 MG tablet TAKE 1 & 1/2 TABLETS BY MOUTH EVERY DAY   . fexofenadine (ALLEGRA) 180 MG tablet Take 1 tablet (180 mg total) by mouth daily. 12/06/2015: Takes as needed  . fluticasone (FLONASE) 50 MCG/ACT nasal spray Place 2 sprays into both nostrils daily. 12/06/2015: Takes as needed  . Liraglutide (VICTOZA) 18 MG/3ML SOPN INJECT 1.2 MG SUBQ ONCE DAILY   . lisinopril-hydrochlorothiazide (PRINZIDE,ZESTORETIC) 20-12.5 MG tablet TAKE 1 TABLET BY MOUTH DAILY FOR FOR BLOOD PRESSURE   . metFORMIN (GLUCOPHAGE) 500 MG tablet Take 1 tablet (500 mg total) by mouth 2 (two) times daily with a meal.   . naproxen sodium (ANAPROX) 220 MG tablet Take 220-440 mg by mouth daily as needed (for pain).   Marland Kitchen omega-3 acid ethyl esters (LOVAZA) 1 g capsule Take 1 capsule (1 g total) by mouth 2 (two) times daily.   . pantoprazole (PROTONIX) 40 MG tablet TAKE 1 TABLET BY MOUTH ONCE DAILY FOR ACID REFLUX   . pravastatin (PRAVACHOL) 20 MG tablet TAKE 1 TABLET BY MOUTH DAILY FOR CHOLESTEROL   . acetaminophen (TYLENOL) 500 MG tablet Take 500 mg by mouth as needed. Reported on 07/12/2015 07/17/2014: Has not taken  . ciprofloxacin (CIPRO) 500 MG tablet Take 1 tablet (500 mg total) by mouth 2 (two) times daily. (Patient not taking: Reported on 12/06/2015)   . HYDROcodone-acetaminophen (NORCO/VICODIN) 5-325 MG tablet Take 1 tablet by mouth every  6 (six) hours as needed. (Patient not taking: Reported on 12/06/2015)   . Insulin Pen Needle (PEN NEEDLES 31GX5/16") 31G X 8 MM MISC Use daily as directed   . neomycin-polymyxin-hydrocortisone (CORTISPORIN) otic solution Place 4 drops into the left ear 4 (four) times daily. (Patient not taking: Reported on 12/06/2015)   . ONE TOUCH ULTRA TEST test strip USE TWICE DAILY AS DIRECTED   . Vitamin D, Ergocalciferol, (DRISDOL) 50000 UNITS CAPS capsule Take 1 capsule (50,000 Units total) by mouth every 7 (seven) days. (Patient not taking: Reported on 12/06/2015)    No  facility-administered encounter medications on file as of 12/06/2015.     Functional Status:   In your present state of health, do you have any difficulty performing the following activities: 12/06/2015 05/17/2015  Hearing? N N  Vision? N N  Difficulty concentrating or making decisions? N -  Walking or climbing stairs? N -  Dressing or bathing? N -  Doing errands, shopping? - Facilities manager and eating ? N -  Using the Toilet? N -  In the past six months, have you accidently leaked urine? N -  Do you have problems with loss of bowel control? N -  Managing your Medications? N -  Managing your Finances? N -  Some recent data might be hidden    Fall/Depression Screening:    PHQ 2/9 Scores 03/13/2015 08/31/2014  PHQ - 2 Score - 0  Exception Documentation Patient refusal -    Assessment. [employer] employee and Link To Wellness member with Type II DM , HTN and hyperlipidemia with most recent Hgb A1C= 5.0%on 07/12/15, meeting HTN treatment targets, and with significantly improved lipid profile on 07/17/15 with decrease in triglycerides from 323 to 189, but decrease in HDL from 34 to 24 with statin therapy.   Plan:   [facility] CM Care Plan Problem One   Flowsheet Row Most Recent Value  Care Plan Problem One  Type II DM, HTN and hyperlipidemia- meeting target Hgb A1C as evidenced by Hgb A1C= 5.0% on 07/12/15, meeting BP targets, lipid profile of 07/17/15 shows improvement in elevated triglycerides (from 323 to 189) and increase in  low HDL (from 34 to 24) on 07/17/15, obesity  Role Documenting the Problem One  Care Management [place] for Problem One  Active  [facility]N Long Term Goal (31-90 days)  Ongoing evidence of good Type II DM and HTN management as evidenced by meeting Hgb A1C target of <7.0% at each check and BP readings consistently <140/<90. Improved control of hyperlipidemia as evidenced by normal lipid profile at next check, no weight gain or weight loss at next assessment  THN  Long Term Goal Start Date  12/06/15  [facility] Long Term Goal Met Date    Interventions for Problem One Long Term Goal Re-reviewed the results of all lab work drawn on 3/11 including Dr. Lance Sell note to [place] about her labwork, reviewed in detail the lipid profile results and congratulated her on the significant improvement in her triglycerides, reviewed the targets for triglycerides and HDL and provided a handout on strategies to improve HDL and triglycerides, provided another True Metrix glucometer and starter kit so she can resume CBG monitoring, reviewed upcoming appointment with Pearson Forster NP in September, will schedule Link To Wellness follow up after she sees primary care provider in September    RNCM to fax today's office visit note to Dr. Wolfgang Phoenix [facility] will meet quarterly and as needed with patient per [facility]  To Wellness program guidelines to assist with Type II DM, HTN, hyperlipidemia and obesity self-management and assess patient's progress toward mutually set goals.  Barrington Ellison RN,CCM,CDE [place] Management Coordinator Link To Wellness Office Phone [phone] Office Fax [phone]

## 2015-12-13 ENCOUNTER — Ambulatory Visit: Payer: Self-pay | Admitting: *Deleted

## 2015-12-20 ENCOUNTER — Other Ambulatory Visit: Payer: Self-pay | Admitting: Family Medicine

## 2015-12-20 MED FILL — ALPRAZolam 0.5 MG TABS: 0.5 | 30 days supply | Qty: 30 | Fill #0

## 2015-12-20 MED FILL — PRAVASTATIN SODIUM 20 MG TA: 20 | 30 days supply | Qty: 30 | Fill #4

## 2015-12-20 NOTE — Telephone Encounter (Signed)
May have 2

## 2015-12-29 MED FILL — UNIFINE PENTIPS 32GX5/32: 32G X 4 MM | 90 days supply | Qty: 100 | Fill #0

## 2016-01-12 ENCOUNTER — Ambulatory Visit: Payer: 59 | Admitting: Nurse Practitioner

## 2016-01-12 ENCOUNTER — Ambulatory Visit: Payer: 59 | Admitting: Family Medicine

## 2016-01-18 ENCOUNTER — Other Ambulatory Visit: Payer: Self-pay | Admitting: Family Medicine

## 2016-01-18 MED FILL — ALPRAZolam 0.5 MG TABS: 0.5 | 30 days supply | Qty: 30 | Fill #1

## 2016-01-18 MED FILL — LISINOPRIL-HCTZ 20-12.5 MG: 20-12.5 | 90 days supply | Qty: 90 | Fill #1

## 2016-01-18 MED FILL — PRAVASTATIN SODIUM 20 MG TA: 20 | 30 days supply | Qty: 30 | Fill #5

## 2016-01-19 MED FILL — AMLODIPINE BESYLATE 5 MG TA: 5 | 90 days supply | Qty: 90 | Fill #0

## 2016-02-01 ENCOUNTER — Other Ambulatory Visit: Payer: Self-pay | Admitting: Family Medicine

## 2016-02-01 NOTE — Telephone Encounter (Signed)
[place] 

## 2016-02-02 MED FILL — VICTOZA 18 MG/3 ML INJECT P: 18 | 45 days supply | Qty: 9 | Fill #0

## 2016-02-02 NOTE — Telephone Encounter (Signed)
This refill needs office visit

## 2016-02-08 ENCOUNTER — Other Ambulatory Visit: Payer: Self-pay | Admitting: Family Medicine

## 2016-02-08 DIAGNOSIS — Z1231 Encounter for screening mammogram for malignant neoplasm of breast: Secondary | ICD-10-CM

## 2016-02-16 ENCOUNTER — Other Ambulatory Visit: Payer: Self-pay | Admitting: *Deleted

## 2016-02-16 NOTE — Patient Outreach (Signed)
Left message on Laysha's cell phone requesting she check her [facility] e-mail sent by this [facility] on 10/10 for dates and times available to meet with this RNCM for Link To Wellness follow up before January 2018. Await her response. Barrington Ellison RN,CCM,CDE [place] Management Coordinator Link To Wellness Office Phone [phone] Office Fax [phone]

## 2016-02-28 ENCOUNTER — Other Ambulatory Visit: Payer: Self-pay | Admitting: *Deleted

## 2016-02-28 DIAGNOSIS — E119 Type 2 diabetes mellitus without complications: Secondary | ICD-10-CM

## 2016-02-28 LAB — POCT GLYCOSYLATED HEMOGLOBIN (HGB A1C): Hemoglobin A1C: 6.5

## 2016-02-28 NOTE — Patient Outreach (Signed)
[facility]) Care Management   02/28/2016  Amy Dean [birth year] [id number]  Amy Dean is an 50 y.o. female who presents to the [place] [facility] office to enroll in the Link To Wellness program for self management assistance with Type II DM. HTN, hyperlipidemia and obesity.  Subjective: Amy Dean says she is doing well, although work stress is increased due to staffing issues. She says she has been more tired than usual since September but denies excessive thirst or urination. She is requesting a POC Hgb A1C as it has not been checked since March 2017 when it was 5.0% and her Metformin was reduced to 500 mg once daily.   Objective:   Review of Systems  Constitutional: Negative.     Physical Exam  Constitutional: She is oriented to person, place, and time. She appears well-developed and well-nourished.  Respiratory: Effort normal.  Neurological: She is alert and oriented to person, place, and time.  Skin: Skin is warm and dry.  Psychiatric: She has a normal mood and affect. Her behavior is normal. Judgment and thought content normal.   POC random CBG= 306 , immediate recheck = 284, recheck 3 hours later= 176 POC Hgb A1C= 6.5%  Encounter Medications:   Outpatient Encounter Prescriptions as of 02/28/2016  Medication Sig Note  . ALPRAZolam (XANAX) 0.5 MG tablet TAKE 1 TABLET BY MOUTH AT BEDTIME AS NEEDED FOR ANXIETY OR SLEEP   . amLODipine (NORVASC) 5 MG tablet TAKE 1 TABLET BY MOUTH DAILY.   . cetirizine (ZYRTEC) 10 MG tablet Take 1 tablet (10 mg total) by mouth daily. 12/06/2015: Takes as needed  . cholecalciferol (VITAMIN D) 1000 units tablet Take 1,000 Units by mouth daily.   . citalopram (CELEXA) 20 MG tablet TAKE 1 & 1/2 TABLETS BY MOUTH EVERY DAY   . fexofenadine (ALLEGRA) 180 MG tablet Take 1 tablet (180 mg total) by mouth daily. 12/06/2015: Takes as needed  . lisinopril-hydrochlorothiazide  (PRINZIDE,ZESTORETIC) 20-12.5 MG tablet TAKE 1 TABLET BY MOUTH DAILY FOR FOR BLOOD PRESSURE   . metFORMIN (GLUCOPHAGE) 500 MG tablet Take 1 tablet (500 mg total) by mouth 2 (two) times daily with a meal. 02/28/2016: Taking one 500 mg tablet daily  . neomycin-polymyxin-hydrocortisone (CORTISPORIN) otic solution Place 4 drops into the left ear 4 (four) times daily.   Marland Kitchen omega-3 acid ethyl esters (LOVAZA) 1 g capsule Take 1 capsule (1 g total) by mouth 2 (two) times daily.   . pravastatin (PRAVACHOL) 20 MG tablet TAKE 1 TABLET BY MOUTH DAILY FOR CHOLESTEROL   . VICTOZA 18 MG/3ML SOPN INJECT 1.2 MG SUBQ ONCE DAILY   . acetaminophen (TYLENOL) 500 MG tablet Take 500 mg by mouth as needed. Reported on 07/12/2015 07/17/2014: Has not taken  . ciprofloxacin (CIPRO) 500 MG tablet Take 1 tablet (500 mg total) by mouth 2 (two) times daily. (Patient not taking: Reported on 02/28/2016)   . fluticasone (FLONASE) 50 MCG/ACT nasal spray Place 2 sprays into both nostrils daily. (Patient not taking: Reported on 02/28/2016) 12/06/2015: Takes as needed  . HYDROcodone-acetaminophen (NORCO/VICODIN) 5-325 MG tablet Take 1 tablet by mouth every 6 (six) hours as needed. (Patient not taking: Reported on 02/28/2016)   . Insulin Pen Needle (PEN NEEDLES 31GX5/16") 31G X 8 MM MISC Use daily as directed   . naproxen sodium (ANAPROX) 220 MG tablet Take 220-440 mg by mouth daily as needed (for pain).   . ONE TOUCH ULTRA TEST test strip USE TWICE DAILY AS DIRECTED   .  pantoprazole (PROTONIX) 40 MG tablet TAKE 1 TABLET BY MOUTH ONCE DAILY FOR ACID REFLUX   . Vitamin D, Ergocalciferol, (DRISDOL) 50000 UNITS CAPS capsule Take 1 capsule (50,000 Units total) by mouth every 7 (seven) days. (Patient not taking: Reported on 02/28/2016)    No facility-administered encounter medications on file as of 02/28/2016.     Functional Status:   In your present state of health, do you have any difficulty performing the following activities: 12/06/2015  05/17/2015  Hearing? N N  Vision? N N  Difficulty concentrating or making decisions? N -  Walking or climbing stairs? N -  Dressing or bathing? N -  Preparing Food and eating ? N -  Using the Toilet? N -  In the past six months, have you accidently leaked urine? N -  Do you have problems with loss of bowel control? N -  Managing your Medications? N -  Managing your Finances? N -  Some recent data might be hidden    Fall/Depression Screening:    PHQ 2/9 Scores 03/13/2015 08/31/2014  PHQ - 2 Score - 0  Exception Documentation Patient refusal -    Assessment. [employer] employee and Link To Wellness member with Type II DM , HTN and hyperlipidemia with today's POC  Hgb A1C= 6.5%, previously 5.0% on 07/12/15, meeting HTN treatment targets, and with significantly improved lipid profile on 07/17/15 with decrease in triglycerides from 323 to 189, but decrease in HDL from 34 to 24 with statin therapy. However, POC CBG significantly elevated, verified by recheck.    Plan:   [facility] CM Care Plan Problem One   Flowsheet Row Most Recent Value  Care Plan Problem One  Type II DM, HTN and hyperlipidemia- meeting target Hgb A1C as evidenced by Hgb A1C= 5.0% on 07/12/15, meeting BP targets, lipid profile of 07/17/15 shows improvement in elevated triglycerides (from 323 to 189) and increasingly low HDL (from 34 to 24) on 07/17/15  Role Documenting the Problem One  Care Management [place] for Problem One  Active  THN Long Term Goal (31-90 days)  Ongoing evidence of good Type II DM and HTN management as evidenced by meeting Hgb A1C target of <7.0% at each check and BP readings consistently <140/<90. Improved control of hyperlipidemia as evidenced by improvement in lipid profile at next check  [facility] Long Term Goal Start Date  02/28/16  Interventions for Problem One Long Term Goal  see short term goal below for intervention discussed at 10/23 office visit   THN CM Short Term Goal #1 (0-30 days)  Amy Dean  will check blood sugar every other day, varying the time and record and notify this RNCM if blood sugars are not meeting target  THN CM Short Term Goal #1 Start Date  02/28/16      RNCM will contact Amy Dean in one week to assess blood sugar values and to answer any questions she may have.  Barrington Ellison RN,CCM,CDE [place] Management Coordinator Link To Wellness Office Phone [phone] Office Fax [phone]

## 2016-02-29 ENCOUNTER — Other Ambulatory Visit: Payer: Self-pay | Admitting: *Deleted

## 2016-02-29 NOTE — Patient Outreach (Signed)
Left message on Infiniti's phone to contact this RNCM next week with an update on her blood sugars.  Also advised her that per Dr. Lance Sell attached to her Hgb A1C results note that she needs to make an appointment to see him for her 6 month check up.  Barrington Ellison RN,CCM,CDE [place] Management Coordinator Link To Wellness Office Phone [phone] Office Fax [phone]

## 2016-03-03 ENCOUNTER — Other Ambulatory Visit: Payer: Self-pay | Admitting: Family Medicine

## 2016-03-03 MED FILL — metFORMIN HCL 500 MG TABS: 500 | 90 days supply | Qty: 180 | Fill #1

## 2016-03-03 MED FILL — PRAVASTATIN SODIUM 20 MG TA: 20 | 30 days supply | Qty: 30 | Fill #0

## 2016-03-07 ENCOUNTER — Other Ambulatory Visit: Payer: Self-pay | Admitting: Family Medicine

## 2016-03-07 MED FILL — OMEGA-3 ETHYL ESTERS 1 GM C: 1 | 90 days supply | Qty: 180 | Fill #0

## 2016-03-07 MED FILL — CITALOPRAM HBR 20 MG TABLET: 20 | 90 days supply | Qty: 135 | Fill #1

## 2016-03-10 ENCOUNTER — Other Ambulatory Visit: Payer: Self-pay | Admitting: *Deleted

## 2016-03-10 NOTE — Patient Outreach (Signed)
Left phone message for Amy Dean with a second request to notify this RNCM of her blood sugar readings either by phone or  In person at the [place] office on Monday 03/13/16. Also expressed appreciation that she made an appointment with her primary care provider for diabetes follow up on 03/16/16.  Barrington Ellison RN,CCM,CDE [place] Management Coordinator Link To Wellness Office Phone [phone] Office Fax [phone]

## 2016-03-13 ENCOUNTER — Ambulatory Visit (HOSPITAL_COMMUNITY)
Admission: RE | Admit: 2016-03-13 | Discharge: 2016-03-13 | Disposition: A | Discharge status: Home / Self Care | Payer: 59 | Source: Ambulatory Visit | Attending: Family Medicine | Admitting: Family Medicine

## 2016-03-13 DIAGNOSIS — Z1231 Encounter for screening mammogram for malignant neoplasm of breast: Secondary | ICD-10-CM | POA: Insufficient documentation

## 2016-03-16 ENCOUNTER — Ambulatory Visit (INDEPENDENT_AMBULATORY_CARE_PROVIDER_SITE_OTHER): Payer: 59 | Admitting: Nurse Practitioner

## 2016-03-16 ENCOUNTER — Encounter: Payer: Self-pay | Admitting: Family Medicine

## 2016-03-16 VITALS — BP 126/88 | HR 88 | Ht 64.0 in | Wt 201.6 lb

## 2016-03-16 DIAGNOSIS — R0602 Shortness of breath: Secondary | ICD-10-CM

## 2016-03-16 DIAGNOSIS — F418 Other specified anxiety disorders: Secondary | ICD-10-CM | POA: Diagnosis not present

## 2016-03-16 DIAGNOSIS — E119 Type 2 diabetes mellitus without complications: Secondary | ICD-10-CM

## 2016-03-16 MED ORDER — CITALOPRAM HYDROBROMIDE 40 MG PO TABS: 40.0000 mg | ORAL_TABLET | Freq: Every day | ORAL | 1 refills | Status: DC

## 2016-03-16 MED ORDER — PRAVASTATIN SODIUM 20 MG PO TABS: ORAL_TABLET | ORAL | 1 refills | Status: DC

## 2016-03-16 MED FILL — ALPRAZolam 0.5 MG TABS: 0.5 | 30 days supply | Qty: 30 | Fill #2

## 2016-03-17 ENCOUNTER — Encounter: Payer: Self-pay | Admitting: Nurse Practitioner

## 2016-03-17 NOTE — Progress Notes (Signed)
Subjective:  Presents for routine follow-up. Restarted her metformin, twice a day dosing at the end of October. Has increased her activity. Continues to take Victoza 1.2 mg. Drinks 2-3 bottles of [employer] per day. Also having some episodes of shortness of breath very sporadic, occurs at rest and with activity occurred twice on Friday. Episodes last about 5-10 minutes. Better with deep breathing. No specific triggers. Stress level 8/10. Extreme stress at work. [facility] her mother had a stroke and MI in her 57s, note that she was a smoker. No obvious acid reflux heartburn or abdominal pain. No chest pain/ischemic type pain. No edema.  Objective:   BP 126/88 (BP Location: Left Arm, Patient Position: Sitting, Cuff Size: Large)   Pulse 88   Ht 5\' 4"  (1.626 m)   Wt 201 lb 9.6 oz (91.4 kg)   LMP 02/10/2011   SpO2 98%   BMI 34.60 kg/m  NAD. Alert, oriented. Lungs clear. Heart regular rate rhythm. Lower extremities no edema. Abdomen soft nontender. EKG no ischemic changes noted. Last hemoglobin A1c on 02/28/2016 was 6.5.  Assessment:  Problem List Items Addressed This Visit      Endocrine   Diabetes mellitus type II, controlled ([place]) - Primary   Relevant Medications   pravastatin (PRAVACHOL) 20 MG tablet     Other   Depression with anxiety    Other Visit Diagnoses    Shortness of breath         Plan:  Meds ordered this encounter  Medications  . citalopram (CELEXA) 40 MG tablet    Sig: Take 1 tablet (40 mg total) by mouth daily.    Dispense:  90 tablet    Refill:  1    Order Specific Question:   Supervising Provider    Answer:   Mikey Kirschner [[id number]]  . pravastatin (PRAVACHOL) 20 MG tablet    Sig: TAKE 1 TABLET BY MOUTH DAILY FOR CHOLESTEROL    Dispense:  90 tablet    Refill:  1    Order Specific Question:   Supervising Provider    Answer:   Maggie Font   Based on her description shortness of breath is most likely related to anxiety and stress. Increase citalopram to  40 mg daily. Because of her personal family history, will refer for cardiology consultation. Patient to call our office or seek help immediately if any urgent issues. Warning signs reviewed. Also call back if any problems with her new dose of Celexa. Encourage patient to cut out regular soda, calorie intake between 400-800 per day from that alone.

## 2016-03-22 ENCOUNTER — Encounter: Payer: Self-pay | Admitting: Family Medicine

## 2016-03-23 ENCOUNTER — Ambulatory Visit: Payer: 59 | Admitting: Nurse Practitioner

## 2016-04-03 ENCOUNTER — Other Ambulatory Visit: Payer: Self-pay | Admitting: Family Medicine

## 2016-04-03 MED FILL — VICTOZA 2-PAK 18 MG/3 ML PE: 18 | 30 days supply | Qty: 6 | Fill #0

## 2016-04-04 ENCOUNTER — Encounter: Payer: Self-pay | Admitting: Cardiology

## 2016-04-04 NOTE — Progress Notes (Deleted)
Cardiology Office Note  Date: 04/04/2016   ID: Amy Dean, DOB 06-Sep-1965, MRN [id number]  PCP: Sallee Lange, MD  Consulting Cardiologist: Rozann Lesches, MD   No chief complaint on file.   History of Present Illness: Amy Dean is a 50 y.o. female referred for cardiology consultation by Dr. Wolfgang Phoenix. She was seen recently by Ms. Hoskins NP describing shortness of breath and also significant psychosocial stressors and anxiety.  Past Medical History:  Diagnosis Date  . Anemia    resolved post hysterectomy  . Dyslipidemia   . Enteritis   . Essential hypertension   . Hyperlipidemia   . Type 2 diabetes mellitus ([place])     Past Surgical History:  Procedure Laterality Date  . ABDOMINAL HYSTERECTOMY    . [place]   x2  . COLONOSCOPY N/A 04/22/2015   Procedure: COLONOSCOPY;  Surgeon: Rogene Houston, MD;  Location: [facility] ENDO SUITE;  Service: Endoscopy;  Laterality: N/A;  1:00  . EUS  10/26/2011   Procedure: UPPER ENDOSCOPIC ULTRASOUND (EUS) LINEAR;  Surgeon: Milus Banister, MD;  Location: [facility] ENDOSCOPY;  Service: Endoscopy;  Laterality: N/A;  . HYSTEROSCOPY W/ ENDOMETRIAL ABLATION  07/22/2008   K.Harrington Challenger MD [facility]  . SUPRACERVICAL ABDOMINAL HYSTERECTOMY  03/21/2011   Procedure: HYSTERECTOMY SUPRACERVICAL ABDOMINAL;  Surgeon: Jonnie Kind, MD;  Location: [facility] ORS;  Service: Gynecology;;  . TONSILLECTOMY    . TUBAL LIGATION      Current Outpatient Prescriptions  Medication Sig Dispense Refill  . acetaminophen (TYLENOL) 500 MG tablet Take 500 mg by mouth as needed. Reported on 07/12/2015    . ALPRAZolam (XANAX) 0.5 MG tablet TAKE 1 TABLET BY MOUTH AT BEDTIME AS NEEDED FOR ANXIETY OR SLEEP 30 tablet 2  . amLODipine (NORVASC) 5 MG tablet TAKE 1 TABLET BY MOUTH DAILY. 90 tablet 0  . cetirizine (ZYRTEC) 10 MG tablet Take 1 tablet (10 mg total) by mouth daily. 30 tablet 5  . cholecalciferol (VITAMIN D) 1000 units tablet Take 1,000 Units by mouth  daily.    . ciprofloxacin (CIPRO) 500 MG tablet Take 1 tablet (500 mg total) by mouth 2 (two) times daily. (Patient not taking: Reported on 03/16/2016) 20 tablet 0  . citalopram (CELEXA) 40 MG tablet Take 1 tablet (40 mg total) by mouth daily. 90 tablet 1  . fexofenadine (ALLEGRA) 180 MG tablet Take 1 tablet (180 mg total) by mouth daily. 30 tablet 5  . fluticasone (FLONASE) 50 MCG/ACT nasal spray Place 2 sprays into both nostrils daily. 48 g 11  . HYDROcodone-acetaminophen (NORCO/VICODIN) 5-325 MG tablet Take 1 tablet by mouth every 6 (six) hours as needed. (Patient not taking: Reported on 03/16/2016) 30 tablet 0  . Insulin Pen Needle (PEN NEEDLES 31GX5/16") 31G X 8 MM MISC Use daily as directed 100 each 11  . lisinopril-hydrochlorothiazide (PRINZIDE,ZESTORETIC) 20-12.5 MG tablet TAKE 1 TABLET BY MOUTH DAILY FOR FOR BLOOD PRESSURE 30 tablet 5  . metFORMIN (GLUCOPHAGE) 500 MG tablet Take 1 tablet (500 mg total) by mouth 2 (two) times daily with a meal. 180 tablet 1  . naproxen sodium (ANAPROX) 220 MG tablet Take 220-440 mg by mouth daily as needed (for pain).    Marland Kitchen neomycin-polymyxin-hydrocortisone (CORTISPORIN) otic solution Place 4 drops into the left ear 4 (four) times daily. (Patient not taking: Reported on 03/16/2016) 10 mL 0  . omega-3 acid ethyl esters (LOVAZA) 1 g capsule TAKE 1 CAPSULE BY MOUTH 2 TIMES DAILY. 180 capsule 0  .  ONE TOUCH ULTRA TEST test strip USE TWICE DAILY AS DIRECTED 200 each PRN  . pantoprazole (PROTONIX) 40 MG tablet TAKE 1 TABLET BY MOUTH ONCE DAILY FOR ACID REFLUX 90 tablet 0  . pravastatin (PRAVACHOL) 20 MG tablet TAKE 1 TABLET BY MOUTH DAILY FOR CHOLESTEROL 90 tablet 1  . VICTOZA 18 MG/3ML SOPN INJECT 1.2 MG SUBQ ONCE DAILY 9 mL 5  . Vitamin D, Ergocalciferol, (DRISDOL) 50000 UNITS CAPS capsule Take 1 capsule (50,000 Units total) by mouth every 7 (seven) days. (Patient not taking: Reported on 03/16/2016) 4 capsule 1   No current facility-administered medications for this  visit.    Allergies:  Patient has no known allergies.   Social History: The patient  reports that she has quit smoking. She smoked 1.50 packs per day. She has never used smokeless tobacco. She reports that she drinks alcohol. She reports that she does not use drugs.   Family History: The patient's family history includes Healthy in her son; Heart attack in her mother and paternal uncle; Hypertension in her maternal grandmother; Sarcoidosis in her mother; Stroke in her mother.   ROS:  Please see the history of present illness. Otherwise, complete review of systems is positive for {NONE DEFAULTED:18576::"none"}.  All other systems are reviewed and negative.   Physical Exam: VS:  LMP 02/10/2011 , BMI There is no height or weight on file to calculate BMI.  Wt Readings from Last 3 Encounters:  03/16/16 201 lb 9.6 oz (91.4 kg)  12/06/15 196 lb (88.9 kg)  10/26/15 195 lb (88.5 kg)    General: Patient appears comfortable at rest. HEENT: Conjunctiva and lids normal, oropharynx clear with moist mucosa. Neck: Supple, no elevated JVP or carotid bruits, no thyromegaly. Lungs: Clear to auscultation, nonlabored breathing at rest. Cardiac: Regular rate and rhythm, no S3 or significant systolic murmur, no pericardial rub. Abdomen: Soft, nontender, no hepatomegaly, bowel sounds present, no guarding or rebound. Extremities: No pitting edema, distal pulses 2+. Skin: Warm and dry. Musculoskeletal: No kyphosis. Neuropsychiatric: Alert and oriented x3, affect grossly appropriate.  ECG: I personally reviewed the tracing from 03/16/2016 which showed sinus rhythm with nonspecific ST abnormalities, leftward axis.  Recent Labwork: 07/17/2015: ALT 22; AST 23; BUN 10; Creatinine, Ser 0.74; Potassium 4.0; Sodium 143     Component Value Date/Time   CHOL 140 07/17/2015 0814   TRIG 189 (H) 07/17/2015 0814   HDL 24 (L) 07/17/2015 0814   CHOLHDL 5.8 (H) 07/17/2015 0814   CHOLHDL 4.2 04/08/2014 0750   VLDL 34  04/08/2014 0750   LDLCALC 78 07/17/2015 0814    Other Studies Reviewed Today:   Assessment and Plan:   Current medicines were reviewed with the patient today.  No orders of the defined types were placed in this encounter.   Disposition:  Signed, Satira Sark, MD, [facility] 04/04/2016 4:00 PM    [facility] at [facility] [address], [place], [place] [address] Phone: [phone]; Fax: [phone]

## 2016-04-05 MED FILL — PRAVASTATIN SODIUM 20 MG TA: 20 | 90 days supply | Qty: 90 | Fill #0

## 2016-04-06 ENCOUNTER — Ambulatory Visit: Payer: Self-pay | Admitting: Cardiology

## 2016-04-10 ENCOUNTER — Other Ambulatory Visit: Payer: Self-pay | Admitting: *Deleted

## 2016-04-10 ENCOUNTER — Encounter: Payer: Self-pay | Admitting: *Deleted

## 2016-04-10 NOTE — Patient Outreach (Signed)
[facility]) Care Management   04/10/2016  Amy Dean 1966/02/08 [id number]  Amy Dean is an 50 y.o. female who presents to the [place] Management office with her coworker and friend Amy Dean for routine Link To Wellness program for self management assistance with Type II DM. HTN, hyperlipidemia and obesity.  Subjective: Amy Dean says she saw her primary care provider on 11/9 and will see a cardiologist on 05/15/16 to rule out that the intermittent shortness of breath she is experiencing is not cardiac related. She says she has a history of heart disease in her family.  She says her Metformin was increased to 100 mg daily and her fasting blood sugars are consistently meeting target.   Objective:   Review of Systems  Constitutional: Negative.     Physical Exam  Constitutional: She is oriented to person, place, and time. She appears well-developed and well-nourished.  Respiratory: Effort normal.  Neurological: She is alert and oriented to person, place, and time.  Skin: Skin is warm and dry.  Psychiatric: She has a normal mood and affect. Her behavior is normal. Judgment and thought content normal.   Vitals:   04/10/16 0959  Pulse: 90  Room air saturation 97%   Encounter Medications:   Outpatient Encounter Prescriptions as of 04/10/2016  Medication Sig Note  . acetaminophen (TYLENOL) 500 MG tablet Take 500 mg by mouth as needed. Reported on 07/12/2015 07/17/2014: Has not taken  . ALPRAZolam (XANAX) 0.5 MG tablet TAKE 1 TABLET BY MOUTH AT BEDTIME AS NEEDED FOR ANXIETY OR SLEEP   . amLODipine (NORVASC) 5 MG tablet TAKE 1 TABLET BY MOUTH DAILY.   . cetirizine (ZYRTEC) 10 MG tablet Take 1 tablet (10 mg total) by mouth daily. 12/06/2015: Takes as needed  . cholecalciferol (VITAMIN D) 1000 units tablet Take 1,000 Units by mouth daily.   . citalopram (CELEXA) 40 MG tablet Take 1 tablet (40 mg total) by mouth daily.   .  fexofenadine (ALLEGRA) 180 MG tablet Take 1 tablet (180 mg total) by mouth daily. 12/06/2015: Takes as needed  . fluticasone (FLONASE) 50 MCG/ACT nasal spray Place 2 sprays into both nostrils daily. 12/06/2015: Takes as needed  . Insulin Pen Needle (PEN NEEDLES 31GX5/16") 31G X 8 MM MISC Use daily as directed   . lisinopril-hydrochlorothiazide (PRINZIDE,ZESTORETIC) 20-12.5 MG tablet TAKE 1 TABLET BY MOUTH DAILY FOR FOR BLOOD PRESSURE   . metFORMIN (GLUCOPHAGE) 500 MG tablet Take 1 tablet (500 mg total) by mouth 2 (two) times daily with a meal. 04/10/2016: Takes 2 - 500 mg tablets in the mroning  . naproxen sodium (ANAPROX) 220 MG tablet Take 220-440 mg by mouth daily as needed (for pain).   Marland Kitchen omega-3 acid ethyl esters (LOVAZA) 1 g capsule TAKE 1 CAPSULE BY MOUTH 2 TIMES DAILY.   . ONE TOUCH ULTRA TEST test strip USE TWICE DAILY AS DIRECTED   . pantoprazole (PROTONIX) 40 MG tablet TAKE 1 TABLET BY MOUTH ONCE DAILY FOR ACID REFLUX 04/10/2016: Takes prn- takes on average once every 2 weeks  . pravastatin (PRAVACHOL) 20 MG tablet TAKE 1 TABLET BY MOUTH DAILY FOR CHOLESTEROL   . VICTOZA 18 MG/3ML SOPN INJECT 1.2 MG SUBQ ONCE DAILY   . ciprofloxacin (CIPRO) 500 MG tablet Take 1 tablet (500 mg total) by mouth 2 (two) times daily. (Patient not taking: Reported on 04/10/2016)   . HYDROcodone-acetaminophen (NORCO/VICODIN) 5-325 MG tablet Take 1 tablet by mouth every 6 (six) hours as needed. (  Patient not taking: Reported on 04/10/2016)   . neomycin-polymyxin-hydrocortisone (CORTISPORIN) otic solution Place 4 drops into the left ear 4 (four) times daily. (Patient not taking: Reported on 04/10/2016)   . Vitamin D, Ergocalciferol, (DRISDOL) 50000 UNITS CAPS capsule Take 1 capsule (50,000 Units total) by mouth every 7 (seven) days. (Patient not taking: Reported on 04/10/2016)    No facility-administered encounter medications on file as of 04/10/2016.     Functional Status:   In your present state of health, do you have  any difficulty performing the following activities: 04/10/2016 12/06/2015  Hearing? N N  Vision? N N  Difficulty concentrating or making decisions? N N  Walking or climbing stairs? N N  Dressing or bathing? N N  Doing errands, shopping? N -  Conservation officer, nature and eating ? - N  Using the Toilet? - N  In the past six months, have you accidently leaked urine? - N  Do you have problems with loss of bowel control? - N  Managing your Medications? - N  Managing your Finances? - N  Some recent data might be hidden    Fall/Depression Screening:    PHQ 2/9 Scores 03/13/2015 08/31/2014  PHQ - 2 Score - 0  Exception Documentation Patient refusal -    Assessment. [employer] employee and Link To Wellness member with Type II DM , HTN, hyperlipidemia and obesity meeting DM and HTN treatment targets,with significantly improved lipid profile on 07/17/15 with decrease in triglycerides from 323 to 189, but decrease in HDL from 34 to 24 with statin therapy. Current body mass index= 34.60   Plan:      [facility] CM Care Plan Problem One   Flowsheet Row Most Recent Value  Care Plan Problem One Type II DM, HTN, hyperlipidemia and obeisty- meeting target Hgb A1C as evidenced by Hgb A1C= 6.5% on 02/28/16, meeting BP targets of < 140/<90, lipid profile of 07/17/15 shows improvement in elevated triglycerides (from 323 to 189) and increasingly low HDL (from 34 to 24) on 07/17/15  Role Documenting the Problem One  Care Management [place] for Problem One  Active  THN Long Term Goal (31-90 days) Ongoing good Type II DM and HTN management as evidenced by meeting Hgb A1C target of <7.0% at each check and BP readings consistently <130/<80. Improved control of hyperlipidemia as evidenced by improvement in lipid profile at next check, and Amy Dean will be an active participant in the 2018 [facility] disease management program   THN Long Term Goal Start Date  04/10/16  Interventions for Problem One Long Term Goal Reviewed  medications and medication adherence, discussed upcoming appointment cardiologist to assess intermittent shortness of breath, discussed [facility] digital disease self management program and with Amy Dean's permission , enrolled her in the program, explained that disease self management follow up will occur via [place] beginning in 2018    RNCM will fax today's note to primary care provider.    Barrington Ellison RN,CCM,CDE [place] Management Coordinator Link To Wellness Office Phone [phone] Office Fax [phone]

## 2016-04-11 MED FILL — UNIFINE PENTIPS 32GX5/32": 32G X 4 MM | 90 days supply | Qty: 100 | Fill #1

## 2016-04-11 MED FILL — UNIFINE PENTIPS 32GX5/32: 32G X 4 MM | 90 days supply | Qty: 100 | Fill #1

## 2016-04-17 ENCOUNTER — Other Ambulatory Visit: Payer: Self-pay | Admitting: *Deleted

## 2016-04-17 NOTE — Telephone Encounter (Signed)
May have this +5 refills 

## 2016-04-18 MED ORDER — ALPRAZOLAM 0.5 MG PO TABS: ORAL_TABLET | ORAL | 5 refills | Status: DC

## 2016-04-18 MED FILL — ALPRAZolam 0.5 MG TABS: 0.5 | 30 days supply | Qty: 30 | Fill #0

## 2016-05-15 ENCOUNTER — Encounter: Payer: Self-pay | Admitting: Cardiology

## 2016-05-15 ENCOUNTER — Ambulatory Visit: Payer: Self-pay | Admitting: Cardiology

## 2016-05-15 NOTE — Progress Notes (Deleted)
Cardiology Office Note  Date: 05/15/2016   ID: Amy Dean, DOB 10-Feb-1966, MRN [id number]  PCP: Sallee Lange, MD  Consulting Cardiologist: Rozann Lesches, MD   No chief complaint on file.   History of Present Illness: Amy Dean is a 51 y.o. female referred for cardiology consultation by Ms. Hoskins NP.  Past Medical History:  Diagnosis Date  . Anemia    Resolved post hysterectomy  . Enteritis   . Essential hypertension   . Hyperlipidemia   . Type 2 diabetes mellitus ([place])     Past Surgical History:  Procedure Laterality Date  . ABDOMINAL HYSTERECTOMY    . [place]   x2  . COLONOSCOPY N/A 04/22/2015   Procedure: COLONOSCOPY;  Surgeon: Rogene Houston, MD;  [facility];  Service: Endoscopy;  Laterality: N/A;  1:00  . EUS  10/26/2011   Procedure: UPPER ENDOSCOPIC ULTRASOUND (EUS) LINEAR;  Surgeon: Milus Banister, MD;  [facility];  Service: Endoscopy;  Laterality: N/A;  . HYSTEROSCOPY W/ ENDOMETRIAL ABLATION  07/22/2008   K.Harrington Challenger MD [facility]  . SUPRACERVICAL ABDOMINAL HYSTERECTOMY  03/21/2011   Procedure: HYSTERECTOMY SUPRACERVICAL ABDOMINAL;  Surgeon: Jonnie Kind, MD;  Location: [facility];  Service: Gynecology;;  . TONSILLECTOMY    . TUBAL LIGATION      Current Outpatient Prescriptions  Medication Sig Dispense Refill  . acetaminophen (TYLENOL) 500 MG tablet Take 500 mg by mouth as needed. Reported on 07/12/2015    . ALPRAZolam (XANAX) 0.5 MG tablet TAKE 1 TABLET BY MOUTH AT BEDTIME AS NEEDED FOR ANXIETY OR SLEEP 30 tablet 5  . amLODipine (NORVASC) 5 MG tablet TAKE 1 TABLET BY MOUTH DAILY. 90 tablet 0  . cetirizine (ZYRTEC) 10 MG tablet Take 1 tablet (10 mg total) by mouth daily. 30 tablet 5  . cholecalciferol (VITAMIN D) 1000 units tablet Take 1,000 Units by mouth daily.    . ciprofloxacin (CIPRO) 500 MG tablet Take 1 tablet (500 mg total) by mouth 2 (two) times daily. (Patient not taking: Reported on  04/10/2016) 20 tablet 0  . citalopram (CELEXA) 40 MG tablet Take 1 tablet (40 mg total) by mouth daily. 90 tablet 1  . fexofenadine (ALLEGRA) 180 MG tablet Take 1 tablet (180 mg total) by mouth daily. 30 tablet 5  . fluticasone (FLONASE) 50 MCG/ACT nasal spray Place 2 sprays into both nostrils daily. 48 g 11  . HYDROcodone-acetaminophen (NORCO/VICODIN) 5-325 MG tablet Take 1 tablet by mouth every 6 (six) hours as needed. (Patient not taking: Reported on 04/10/2016) 30 tablet 0  . Insulin Pen Needle (PEN NEEDLES 31GX5/16") 31G X 8 MM MISC Use daily as directed 100 each 11  . lisinopril-hydrochlorothiazide (PRINZIDE,ZESTORETIC) 20-12.5 MG tablet TAKE 1 TABLET BY MOUTH DAILY FOR FOR BLOOD PRESSURE 30 tablet 5  . metFORMIN (GLUCOPHAGE) 500 MG tablet Take 1 tablet (500 mg total) by mouth 2 (two) times daily with a meal. 180 tablet 1  . naproxen sodium (ANAPROX) 220 MG tablet Take 220-440 mg by mouth daily as needed (for pain).    Marland Kitchen neomycin-polymyxin-hydrocortisone (CORTISPORIN) otic solution Place 4 drops into the left ear 4 (four) times daily. (Patient not taking: Reported on 04/10/2016) 10 mL 0  . omega-3 acid ethyl esters (LOVAZA) 1 g capsule TAKE 1 CAPSULE BY MOUTH 2 TIMES DAILY. 180 capsule 0  . ONE TOUCH ULTRA TEST test strip USE TWICE DAILY AS DIRECTED 200 each PRN  . pantoprazole (PROTONIX) 40 MG tablet  TAKE 1 TABLET BY MOUTH ONCE DAILY FOR ACID REFLUX 90 tablet 0  . pravastatin (PRAVACHOL) 20 MG tablet TAKE 1 TABLET BY MOUTH DAILY FOR CHOLESTEROL 90 tablet 1  . VICTOZA 18 MG/3ML SOPN INJECT 1.2 MG SUBQ ONCE DAILY 9 mL 5  . Vitamin D, Ergocalciferol, (DRISDOL) 50000 UNITS CAPS capsule Take 1 capsule (50,000 Units total) by mouth every 7 (seven) days. (Patient not taking: Reported on 04/10/2016) 4 capsule 1   No current facility-administered medications for this visit.    Allergies:  Patient has no known allergies.   Social History: The patient  reports that she has quit smoking. She smoked 1.50  packs per day. She has never used smokeless tobacco. She reports that she drinks alcohol. She reports that she does not use drugs.   Family History: The patient's family history includes Healthy in her son; Heart attack in her mother and paternal uncle; Hypertension in her maternal grandmother; Sarcoidosis in her mother; Stroke in her mother.   ROS:  Please see the history of present illness. Otherwise, complete review of systems is positive for {NONE DEFAULTED:18576::"none"}.  All other systems are reviewed and negative.   Physical Exam: VS:  LMP 02/10/2011 , BMI There is no height or weight on file to calculate BMI.  Wt Readings from Last 3 Encounters:  03/16/16 201 lb 9.6 oz (91.4 kg)  12/06/15 196 lb (88.9 kg)  10/26/15 195 lb (88.5 kg)    General: Patient appears comfortable at rest. HEENT: Conjunctiva and lids normal, oropharynx clear with moist mucosa. Neck: Supple, no elevated JVP or carotid bruits, no thyromegaly. Lungs: Clear to auscultation, nonlabored breathing at rest. Cardiac: Regular rate and rhythm, no S3 or significant systolic murmur, no pericardial rub. Abdomen: Soft, nontender, no hepatomegaly, bowel sounds present, no guarding or rebound. Extremities: No pitting edema, distal pulses 2+. Skin: Warm and dry. Musculoskeletal: No kyphosis. Neuropsychiatric: Alert and oriented x3, affect grossly appropriate.  ECG: I personally reviewed the tracing from 03/16/2016 which showed sinus rhythm with left axis and increased voltage.  Recent Labwork: 07/17/2015: ALT 22; AST 23; BUN 10; Creatinine, Ser 0.74; Potassium 4.0; Sodium 143     Component Value Date/Time   CHOL 140 07/17/2015 0814   TRIG 189 (H) 07/17/2015 0814   HDL 24 (L) 07/17/2015 0814   CHOLHDL 5.8 (H) 07/17/2015 0814   CHOLHDL 4.2 04/08/2014 0750   VLDL 34 04/08/2014 0750   LDLCALC 78 07/17/2015 0814    Other Studies Reviewed Today:   Assessment and Plan:   Current medicines were reviewed with the  patient today.  No orders of the defined types were placed in this encounter.   Disposition:  Signed, Satira Sark, MD, [facility] 05/15/2016 9:19 AM    [facility] [place]. [address], [place], [place] [address] Phone: [phone]; Fax: [phone]

## 2016-05-17 ENCOUNTER — Other Ambulatory Visit: Payer: Self-pay | Admitting: Family Medicine

## 2016-05-17 ENCOUNTER — Other Ambulatory Visit: Payer: Self-pay | Admitting: Nurse Practitioner

## 2016-05-17 MED FILL — AMLODIPINE BESYLATE 5 MG TA: 5 | 90 days supply | Qty: 90 | Fill #0

## 2016-05-17 MED FILL — LISINOPRIL-HCTZ 20-12.5 MG: 20-12.5 | 90 days supply | Qty: 90 | Fill #0

## 2016-05-22 ENCOUNTER — Other Ambulatory Visit: Payer: Self-pay | Admitting: Family Medicine

## 2016-05-22 MED FILL — ALPRAZolam 0.5 MG TABS: 0.5 | 30 days supply | Qty: 30 | Fill #1

## 2016-05-22 MED FILL — metFORMIN HCL 500 MG TABS: 500 | 90 days supply | Qty: 180 | Fill #0

## 2016-05-22 NOTE — Telephone Encounter (Signed)
Pt advised to f/u 07/14/16. No appt sch. Would you like to refill?

## 2016-06-13 MED FILL — VICTOZA 2-PAK 18 MG/3 ML PE: 18 | 30 days supply | Qty: 6 | Fill #1

## 2016-06-20 MED FILL — ALPRAZolam 0.5 MG TABS: 0.5 | 30 days supply | Qty: 30 | Fill #2

## 2016-07-12 ENCOUNTER — Telehealth: Payer: Self-pay | Admitting: Family Medicine

## 2016-07-12 NOTE — Telephone Encounter (Signed)
Received a fax from [facility] to change patient prescription for Accu Check Guide test strips, and Accu Check Fastclix lancets. May we write a prescription. Please advise?

## 2016-07-12 NOTE — Telephone Encounter (Signed)
Sure, please do

## 2016-07-12 NOTE — Telephone Encounter (Signed)
Done

## 2016-07-20 ENCOUNTER — Telehealth: Payer: Self-pay | Admitting: Family Medicine

## 2016-07-20 MED FILL — ACCU-CHEK FASTCLIX LANCETS: 50 days supply | Qty: 102 | Fill #0

## 2016-07-20 MED FILL — ACCU-CHEK GUIDE TEST STRIP: 50 days supply | Qty: 100 | Fill #0

## 2016-07-20 NOTE — Telephone Encounter (Signed)
Patient said she was supposed to have Rx for Accucheck test strips and lancets sent to [facility] last week, but they are telling her they never received it.  Please advise.

## 2016-07-20 NOTE — Telephone Encounter (Signed)
Left message return call 07/20/2016. (Called patient's pharmacy and left prescription on voicemail

## 2016-07-21 ENCOUNTER — Other Ambulatory Visit: Payer: Self-pay | Admitting: *Deleted

## 2016-07-25 ENCOUNTER — Encounter: Payer: Self-pay | Admitting: Family Medicine

## 2016-07-25 ENCOUNTER — Ambulatory Visit (INDEPENDENT_AMBULATORY_CARE_PROVIDER_SITE_OTHER): Payer: 59 | Admitting: Family Medicine

## 2016-07-25 VITALS — BP 144/90 | Temp 98.0°F | Ht 64.0 in | Wt 207.1 lb

## 2016-07-25 DIAGNOSIS — B354 Tinea corporis: Secondary | ICD-10-CM

## 2016-07-25 DIAGNOSIS — M5432 Sciatica, left side: Secondary | ICD-10-CM | POA: Diagnosis not present

## 2016-07-25 MED ORDER — KETOCONAZOLE 2 % EX CREA: 1.0000 "application " | TOPICAL_CREAM | Freq: Two times a day (BID) | CUTANEOUS | 4 refills | Status: DC

## 2016-07-25 MED ORDER — TRIAMCINOLONE ACETONIDE 0.1 % EX CREA: 1.0000 "application " | TOPICAL_CREAM | Freq: Two times a day (BID) | CUTANEOUS | 4 refills | Status: DC

## 2016-07-25 MED FILL — KETOCONAZOLE 2% CREAM: 2 | 10 days supply | Qty: 30 | Fill #0

## 2016-07-25 MED FILL — TRIAMCINOLONE 0.1% CREAM: 0.1 | 10 days supply | Qty: 80 | Fill #0

## 2016-07-25 NOTE — Progress Notes (Signed)
   Subjective:    Patient ID: Amy Dean, female    DOB: 07-Feb-1966, 51 y.o.   MRN: [id number]  Rash  This is a new problem. The current episode started 1 to 4 weeks ago. The affected locations include the abdomen. The rash is characterized by redness and itchiness (raised ). Past treatments include anti-itch cream.   Patient also has concerns of low back pain radiating down her leg.  She states this is been going on for a past few days hurts in the left low back radiates down the left leg hurts with certain movements no weakness no limp  Review of Systems  Skin: Positive for rash.  Please see above denies loss of bowel or bladder control no fevers or chills     Objective:   Physical Exam Subjective low back discomfort positive straight leg raise on the left strength is equal. Reflexes good. Fair range of motion. Walks without a limp.  Has a erythematous area on the right lower abdomen which could well be a skin fungus with associated itching Pain does not wake her up at night No Loss of strength. No loss of bowel or bladder control     Assessment & Plan:  Left side sciatica-anti-inflammatories when necessary proper exercises were shown to do on a regular basis. Patient should gradually see improvement over the next 6-8 weeks if not follow-up may need imaging and further studies at that point no red flags  tinea with itching-antifungal and anti-itching cream prescribed follow-up if progressive troubles

## 2016-07-25 NOTE — Patient Instructions (Signed)
Use ketoconazole cream 2 times a day for 2 weeks  May use triamcinolone 2 times a day as needed  For itching  As for the back do your exercises if worse or no better over the next 8 weeks then please follow up

## 2016-07-26 ENCOUNTER — Other Ambulatory Visit: Payer: Self-pay

## 2016-07-26 MED ORDER — PANTOPRAZOLE SODIUM 40 MG PO TBEC: DELAYED_RELEASE_TABLET | ORAL | 0 refills | Status: DC

## 2016-07-26 MED FILL — PANTOPRAZOLE SOD DR 40 MG T: 40 | 90 days supply | Qty: 90 | Fill #0

## 2016-07-26 MED FILL — ALPRAZolam 0.5 MG TABS: 0.5 | 30 days supply | Qty: 30 | Fill #3

## 2016-07-26 MED FILL — PRAVASTATIN SODIUM 20 MG TA: 20 | 90 days supply | Qty: 90 | Fill #1

## 2016-08-03 ENCOUNTER — Other Ambulatory Visit: Payer: Self-pay | Admitting: Family Medicine

## 2016-08-03 MED FILL — VICTOZA 18 MG/3 ML INJECT P: 18 | 45 days supply | Qty: 9 | Fill #0

## 2016-08-31 ENCOUNTER — Other Ambulatory Visit: Payer: Self-pay | Admitting: *Deleted

## 2016-08-31 MED FILL — AMLODIPINE BESYLATE 5 MG TA: 5 | 90 days supply | Qty: 90 | Fill #1

## 2016-08-31 MED FILL — ALPRAZolam 0.5 MG TABS: 0.5 | 30 days supply | Qty: 30 | Fill #4

## 2016-08-31 MED FILL — LISINOPRIL-HCTZ 20-12.5 MG: 20-12.5 | 90 days supply | Qty: 90 | Fill #1

## 2016-08-31 MED FILL — metFORMIN HCL 500 MG TABS: 500 | 90 days supply | Qty: 180 | Fill #1

## 2016-09-01 MED ORDER — OMEGA-3-ACID ETHYL ESTERS 1 G PO CAPS: ORAL_CAPSULE | ORAL | 1 refills | Status: DC

## 2016-09-05 ENCOUNTER — Other Ambulatory Visit: Payer: Self-pay | Admitting: Nurse Practitioner

## 2016-09-05 MED FILL — OMEGA-3 ETHYL ESTERS 1 GM C: 1 | 90 days supply | Qty: 180 | Fill #0

## 2016-09-05 MED FILL — CITALOPRAM HBR 20 MG TABLET: 20 | 90 days supply | Qty: 135 | Fill #0

## 2016-09-06 ENCOUNTER — Encounter (HOSPITAL_COMMUNITY): Payer: Self-pay | Admitting: Emergency Medicine

## 2016-09-06 ENCOUNTER — Emergency Department (HOSPITAL_COMMUNITY)
Admission: EM | Admit: 2016-09-06 | Discharge: 2016-09-06 | Disposition: A | Discharge status: Home / Self Care | Payer: 59 | Attending: Emergency Medicine | Admitting: Emergency Medicine

## 2016-09-06 ENCOUNTER — Emergency Department (HOSPITAL_COMMUNITY): Payer: 59

## 2016-09-06 DIAGNOSIS — E119 Type 2 diabetes mellitus without complications: Secondary | ICD-10-CM | POA: Diagnosis not present

## 2016-09-06 DIAGNOSIS — R51 Headache: Secondary | ICD-10-CM | POA: Diagnosis not present

## 2016-09-06 DIAGNOSIS — Z87891 Personal history of nicotine dependence: Secondary | ICD-10-CM | POA: Diagnosis not present

## 2016-09-06 DIAGNOSIS — R42 Dizziness and giddiness: Secondary | ICD-10-CM | POA: Insufficient documentation

## 2016-09-06 DIAGNOSIS — I10 Essential (primary) hypertension: Secondary | ICD-10-CM | POA: Diagnosis not present

## 2016-09-06 DIAGNOSIS — F419 Anxiety disorder, unspecified: Secondary | ICD-10-CM | POA: Diagnosis not present

## 2016-09-06 DIAGNOSIS — Z79899 Other long term (current) drug therapy: Secondary | ICD-10-CM | POA: Diagnosis not present

## 2016-09-06 LAB — BASIC METABOLIC PANEL
Anion gap: 7 (ref 5–15)
BUN: 12 mg/dL (ref 6–20)
CO2: 26 mmol/L (ref 22–32)
Calcium: 9.5 mg/dL (ref 8.9–10.3)
Chloride: 103 mmol/L (ref 101–111)
Creatinine, Ser: 0.66 mg/dL (ref 0.44–1.00)
GFR calc Af Amer: >60 mL/min (ref >60)
GFR calc non Af Amer: >60 mL/min (ref >60)
Glucose, Bld: 133 mg/dL — ABNORMAL HIGH (ref 65–99)
Potassium: 4.1 mmol/L (ref 3.5–5.1)
Sodium: 136 mmol/L (ref 135–145)

## 2016-09-06 LAB — URINALYSIS, ROUTINE W REFLEX MICROSCOPIC
Bilirubin Urine: NEGATIVE
Glucose, UA: NEGATIVE mg/dL
Hgb urine dipstick: NEGATIVE
Ketones, ur: NEGATIVE mg/dL
Leukocytes, UA: NEGATIVE
Nitrite: NEGATIVE
Protein, ur: NEGATIVE mg/dL
Specific Gravity, Urine: 1.015 (ref 1.005–1.030)
pH: 7 (ref 5.0–8.0)

## 2016-09-06 LAB — CBC
HCT: 39.9 % (ref 36.0–46.0)
Hemoglobin: 14.2 g/dL (ref 12.0–15.0)
MCH: 31.3 pg (ref 26.0–34.0)
MCHC: 35.6 g/dL (ref 30.0–36.0)
MCV: 87.9 fL (ref 78.0–100.0)
Platelets: 190 10*3/uL (ref 150–400)
RBC: 4.54 MIL/uL (ref 3.87–5.11)
RDW: 12.5 % (ref 11.5–15.5)
WBC: 4.6 10*3/uL (ref 4.0–10.5)

## 2016-09-06 LAB — CBG MONITORING, ED: Glucose-Capillary: 138 mg/dL — ABNORMAL HIGH (ref 65–99)

## 2016-09-06 MED ORDER — MECLIZINE HCL 25 MG PO TABS: 25.0000 mg | ORAL_TABLET | Freq: Three times a day (TID) | ORAL | 0 refills | Status: DC | PRN

## 2016-09-06 MED ORDER — MECLIZINE HCL 12.5 MG PO TABS
25.0000 mg | ORAL_TABLET | Freq: Once | ORAL | Status: AC
  Administered 2016-09-06: 25 mg via ORAL
  Filled 2016-09-06: qty 2

## 2016-09-06 NOTE — ED Notes (Signed)
Patient returned from CT

## 2016-09-06 NOTE — ED Triage Notes (Signed)
Pt went to bed normal last night at 0930. Woke up at 3 am to use bathroom and states she almost fell due to dizziness. Pt woke up pta and still very dizzy. c/o ha/sob. Mild sob noted. A/o.

## 2016-09-06 NOTE — ED Provider Notes (Signed)
[facility] DEPT Provider Note   CSN: [id number] Arrival date & time: 09/06/16  0743     History   Chief Complaint Chief Complaint  Patient presents with  . Dizziness    HPI Amy Dean is a 51 y.o. female.  Patient is a 51 year old female who presents to the emergency department with complaint of dizziness.  The patient states that early this morning approximately 3 AM she got up to go to the bathroom. She noticed a sensation of dizziness. She describes this dizziness as feeling as though the room was spinning spinning as well as her being off balance. Is also accompanied by a dull the headache. She made it back to the bedroom, laid down, continue to have the sensation of spinning. The patient awakening in approximately 2 or 3 hours later and continued to have dizziness at this time she felt as though the sensation may have been worse. She noticed this time that she had poor balance that she was trying to get up and take care of her activities of daily living needs. There was no loss of consciousness reported. No chest pain, Mild SOB. There was no loss of bowel or bladder functions. The patient states that she was dizzy but that she had use of her limbs without problem. She's not had any difficulty with swallowing or speaking. His been no recent injury or trauma to the head. His been no recent changes in medications. No recent diet changes. Patient presents to the emergency department at this time for assistance with these issues.      Past Medical History:  Diagnosis Date  . Anemia    Resolved post hysterectomy  . Enteritis   . Essential hypertension   . Hyperlipidemia   . Type 2 diabetes mellitus [facility])     Patient Active Problem List   Diagnosis Date Noted  . Hypertriglyceridemia 01/12/2015  . Dyslipidemia 04/01/2014  . Depression with anxiety 01/05/2014  . Morbid obesity ([place]) 07/17/2013  . GERD (gastroesophageal reflux disease) 12/18/2011  . Nonspecific  (abnormal) findings on radiological and other examination of gastrointestinal tract 10/26/2011  . Enteritis 10/04/2011  . Diabetes mellitus type II, controlled ([place]) 03/23/2011    Class: Chronic  . ANEMIA 12/10/2008  . Essential hypertension 12/10/2008  . DYSPHAGIA UNSPECIFIED 12/10/2008    Past Surgical History:  Procedure Laterality Date  . ABDOMINAL HYSTERECTOMY    . [place]   x2  . COLONOSCOPY N/A 04/22/2015   Procedure: COLONOSCOPY;  Surgeon: Rogene Houston, MD;  [facility];  Service: Endoscopy;  Laterality: N/A;  1:00  . EUS  10/26/2011   Procedure: UPPER ENDOSCOPIC ULTRASOUND (EUS) LINEAR;  Surgeon: Milus Banister, MD;  [facility];  Service: Endoscopy;  Laterality: N/A;  . HYSTEROSCOPY W/ ENDOMETRIAL ABLATION  07/22/2008   K.Harrington Challenger MD [facility]  . SUPRACERVICAL ABDOMINAL HYSTERECTOMY  03/21/2011   Procedure: HYSTERECTOMY SUPRACERVICAL ABDOMINAL;  Surgeon: Jonnie Kind, MD;  Location: [facility];  Service: Gynecology;;  . TONSILLECTOMY    . TUBAL LIGATION      OB History    Gravida Para Term Preterm AB Living   3 2     1 2    SAB TAB Ectopic Multiple Live Births   1               Home Medications    Prior to Admission medications   Medication Sig Start Date End Date Taking? Authorizing Provider  acetaminophen (TYLENOL) 500  MG tablet Take 500 mg by mouth as needed. Reported on 07/12/2015    Historical Provider, MD  ALPRAZolam Duanne Moron) 0.5 MG tablet TAKE 1 TABLET BY MOUTH AT BEDTIME AS NEEDED FOR ANXIETY OR SLEEP 04/18/16   Kathyrn Drown, MD  amLODipine (NORVASC) 5 MG tablet TAKE 1 TABLET BY MOUTH DAILY. 05/17/16   Nilda Simmer, NP  cetirizine (ZYRTEC) 10 MG tablet Take 1 tablet (10 mg total) by mouth daily. 07/12/15   Kathyrn Drown, MD  cholecalciferol (VITAMIN D) 1000 units tablet Take 1,000 Units by mouth daily.    Historical Provider, MD  citalopram (CELEXA) 20 MG tablet TAKE 1 & 1/2 TABLETS BY MOUTH EVERY DAY 09/05/16    Nilda Simmer, NP  fexofenadine (ALLEGRA) 180 MG tablet Take 1 tablet (180 mg total) by mouth daily. 07/12/15   Kathyrn Drown, MD  fluticasone (FLONASE) 50 MCG/ACT nasal spray Place 2 sprays into both nostrils daily. Patient not taking: Reported on 07/25/2016 08/20/13   Nilda Simmer, NP  HYDROcodone-acetaminophen (NORCO/VICODIN) 5-325 MG tablet Take 1 tablet by mouth every 6 (six) hours as needed. Patient not taking: Reported on 04/10/2016 10/26/15   Kathyrn Drown, MD  Insulin Pen Needle (PEN NEEDLES 31GX5/16") 31G X 8 MM MISC Use daily as directed 07/16/13   Nilda Simmer, NP  ketoconazole (NIZORAL) 2 % cream Apply 1 application topically 2 (two) times daily. 07/25/16   Kathyrn Drown, MD  lisinopril-hydrochlorothiazide (PRINZIDE,ZESTORETIC) 20-12.5 MG tablet TAKE 1 TABLET BY MOUTH DAILY FOR FOR BLOOD PRESSURE 05/17/16   Mikey Kirschner, MD  metFORMIN (GLUCOPHAGE) 500 MG tablet TAKE 1 TABLET BY MOUTH 2 TIMES DAILY WITH A MEAL. 05/22/16   Nilda Simmer, NP  naproxen sodium (ANAPROX) 220 MG tablet Take 220-440 mg by mouth daily as needed (for pain).    Historical Provider, MD  neomycin-polymyxin-hydrocortisone (CORTISPORIN) otic solution Place 4 drops into the left ear 4 (four) times daily. Patient not taking: Reported on 04/10/2016 10/26/15   Kathyrn Drown, MD  omega-3 acid ethyl esters (LOVAZA) 1 g capsule TAKE 1 CAPSULE BY MOUTH TWICE DAILY 09/05/16   Nilda Simmer, NP  pantoprazole (PROTONIX) 40 MG tablet TAKE 1 TABLET BY MOUTH ONCE DAILY FOR ACID REFLUX 07/26/16   Kathyrn Drown, MD  pravastatin (PRAVACHOL) 20 MG tablet TAKE 1 TABLET BY MOUTH DAILY FOR CHOLESTEROL 03/16/16   Nilda Simmer, NP  triamcinolone cream (KENALOG) 0.1 % Apply 1 application topically 2 (two) times daily. 07/25/16   Kathyrn Drown, MD  VICTOZA 18 MG/3ML SOPN INJECT 1.2 MG SUBQ ONCE DAILY 04/03/16   Nilda Simmer, NP  VICTOZA 18 MG/3ML SOPN INJECT 1.2 MG SUBQ ONCE DAILY 08/03/16   Kathyrn Drown, MD    Vitamin D, Ergocalciferol, (DRISDOL) 50000 UNITS CAPS capsule Take 1 capsule (50,000 Units total) by mouth every 7 (seven) days. 01/04/15   Nilda Simmer, NP    Family History Family History  Problem Relation Age of Onset  . Healthy Son   . Sarcoidosis Mother   . Heart attack Mother   . Stroke Mother   . Heart attack Paternal Uncle   . Hypertension Maternal Grandmother   . Anesthesia problems Neg Hx   . Hypotension Neg Hx   . Malignant hyperthermia Neg Hx   . Pseudochol deficiency Neg Hx     Social History Social History  Substance Use Topics  . Smoking status: Former Smoker    Packs/day: 1.50  .  Smokeless tobacco: Never Used  . Alcohol use Yes     Comment: occastionally on holidays; 2 drinks at a time     Allergies   Patient has no known allergies.   Review of Systems Review of Systems  Constitutional: Negative for activity change.       All ROS Neg except as noted in HPI  HENT: Negative for nosebleeds.   Eyes: Negative for photophobia and discharge.  Respiratory: Negative for cough, shortness of breath and wheezing.   Cardiovascular: Negative for chest pain and palpitations.  Gastrointestinal: Negative for abdominal pain and blood in stool.  Genitourinary: Negative for dysuria, frequency and hematuria.  Musculoskeletal: Negative for arthralgias, back pain and neck pain.  Skin: Negative.   Neurological: Positive for dizziness and headaches. Negative for seizures and speech difficulty.  Psychiatric/Behavioral: Negative for confusion and hallucinations. The patient is nervous/anxious.      Physical Exam Updated Vital Signs BP (!) 175/93   Pulse 87   Temp 97.9 F (36.6 C) (Oral)   Resp (!) 21   LMP 02/10/2011   SpO2 99%   Physical Exam  Constitutional: She is oriented to person, place, and time. She appears well-developed and well-nourished.  Non-toxic appearance.  HENT:  Head: Normocephalic.  Right Ear: Tympanic membrane and external ear normal.   Left Ear: Tympanic membrane and external ear normal.  Eyes: EOM and lids are normal. Pupils are equal, round, and reactive to light.  Neck: Normal range of motion. Neck supple. Carotid bruit is not present.  Cardiovascular: Normal rate, regular rhythm, normal heart sounds, intact distal pulses and normal pulses.   Pulmonary/Chest: Breath sounds normal. No respiratory distress.  Abdominal: Soft. Bowel sounds are normal. There is no tenderness. There is no guarding.  Musculoskeletal: Normal range of motion.  Lymphadenopathy:       Head (right side): No submandibular adenopathy present.       Head (left side): No submandibular adenopathy present.    She has no cervical adenopathy.  Neurological: She is alert and oriented to person, place, and time. She has normal strength. No cranial nerve deficit or sensory deficit. She exhibits normal muscle tone. Coordination normal.  Mild dizziness increases to more severe dizziness with change of position. No significant change in headache.  Skin: Skin is warm and dry.  Psychiatric: She has a normal mood and affect. Her speech is normal.  Nursing note and vitals reviewed.    ED Treatments / Results  Labs (all labs ordered are listed, but only abnormal results are displayed) Labs Reviewed  BASIC METABOLIC PANEL - Abnormal; Notable for the following:       Result Value   Glucose, Bld 133 (*)    All other components within normal limits  CBG MONITORING, ED - Abnormal; Notable for the following:    Glucose-Capillary 138 (*)    All other components within normal limits  CBC  URINALYSIS, ROUTINE W REFLEX MICROSCOPIC    EKG  EKG Interpretation None       Radiology No results found.  Procedures Procedures (including critical care time)  Medications Ordered in ED Medications  meclizine (ANTIVERT) tablet 25 mg (not administered)     Initial Impression / Assessment and Plan / ED Course  I have reviewed the triage vital signs and the  nursing notes.  Pertinent labs & imaging results that were available during my care of the patient were reviewed by me and considered in my medical decision making (see chart for details).  Final Clinical Impressions(s) / ED Diagnoses MDM Blood pressure elevated upon admission and throughout most of the emergency department visit, otherwise the vital signs are essentially within normal limits. Pulse oximetry is 97-99% on room air. The patient's dizziness increases with change of position, but at times does not completely go away after she is at rest.  Electrocardiogram is negative for acute changes.  BASIC metabolic panel is within normal limits. CBC is also normal. Urinalysis is negative for infection. CT head scan is negative for any acute intracranial pathology.  Suspect positional vertigo. Patient will be treated with Antivert. Patient is to follow with primary physician in the office for additional evaluation and management. The patient will return to the emergency department sooner if any changes, problems, or concerns.    Final diagnoses:  Vertigo    New Prescriptions New Prescriptions   No medications on file     Lily Kocher, Hershal Coria 09/07/16 Louisa Second, MD 09/09/16 [phone]

## 2016-09-06 NOTE — ED Notes (Signed)
Patient transported to CT 

## 2016-09-06 NOTE — Discharge Instructions (Signed)
Your blood pressure was elevated initially, otherwise your vital signs were within normal limits. Your oxygen level was 99% on room air. Which is within normal limits by my interpretation. Your electrocardiogram did not show any high degree blocks, or life threatening arrhythmias. The CT scan of your head is negative for any acute stroke or bleed or other acute abnormality. Your examination suggest vertigo. Please use Antivert 3 times daily. This may cause drowsiness, please use caution changing positions and getting around. Please change positions slowly, and hold onto objects when you are walking until this vertigo/off-balance sensation goes away. Please see Dr.Luking for additional evaluation if not improving, or return to the emergency department if any emergent changes, problems, or concerns.

## 2016-09-08 ENCOUNTER — Encounter: Payer: Self-pay | Admitting: Family Medicine

## 2016-09-08 ENCOUNTER — Ambulatory Visit (INDEPENDENT_AMBULATORY_CARE_PROVIDER_SITE_OTHER): Payer: 59 | Admitting: Family Medicine

## 2016-09-08 VITALS — BP 128/84 | Ht 64.0 in | Wt 199.0 lb

## 2016-09-08 DIAGNOSIS — E781 Pure hyperglyceridemia: Secondary | ICD-10-CM | POA: Diagnosis not present

## 2016-09-08 DIAGNOSIS — Z79899 Other long term (current) drug therapy: Secondary | ICD-10-CM

## 2016-09-08 DIAGNOSIS — I10 Essential (primary) hypertension: Secondary | ICD-10-CM

## 2016-09-08 DIAGNOSIS — R42 Dizziness and giddiness: Secondary | ICD-10-CM | POA: Insufficient documentation

## 2016-09-08 DIAGNOSIS — E119 Type 2 diabetes mellitus without complications: Secondary | ICD-10-CM | POA: Diagnosis not present

## 2016-09-08 DIAGNOSIS — E669 Obesity, unspecified: Secondary | ICD-10-CM | POA: Diagnosis not present

## 2016-09-08 DIAGNOSIS — K219 Gastro-esophageal reflux disease without esophagitis: Secondary | ICD-10-CM

## 2016-09-08 MED ORDER — AMLODIPINE BESYLATE 5 MG PO TABS: 5.0000 mg | ORAL_TABLET | Freq: Every day | ORAL | 1 refills | Status: DC

## 2016-09-08 MED ORDER — CETIRIZINE HCL 10 MG PO TABS: 10.0000 mg | ORAL_TABLET | Freq: Every day | ORAL | 1 refills | Status: DC

## 2016-09-08 MED ORDER — LISINOPRIL-HYDROCHLOROTHIAZIDE 20-12.5 MG PO TABS: ORAL_TABLET | ORAL | 1 refills | Status: DC

## 2016-09-08 MED ORDER — PANTOPRAZOLE SODIUM 40 MG PO TBEC: DELAYED_RELEASE_TABLET | ORAL | 1 refills | Status: DC

## 2016-09-08 MED ORDER — ALPRAZOLAM 0.5 MG PO TABS: ORAL_TABLET | ORAL | 5 refills | Status: DC

## 2016-09-08 MED ORDER — PRAVASTATIN SODIUM 20 MG PO TABS: ORAL_TABLET | ORAL | 1 refills | Status: DC

## 2016-09-08 MED ORDER — METFORMIN HCL 500 MG PO TABS: ORAL_TABLET | ORAL | 1 refills | Status: DC

## 2016-09-08 MED ORDER — OMEGA-3-ACID ETHYL ESTERS 1 G PO CAPS: 1.0000 | ORAL_CAPSULE | Freq: Two times a day (BID) | ORAL | 1 refills | Status: DC

## 2016-09-08 MED ORDER — FEXOFENADINE HCL 180 MG PO TABS: 180.0000 mg | ORAL_TABLET | Freq: Every day | ORAL | 1 refills | Status: DC

## 2016-09-08 MED ORDER — LIRAGLUTIDE 18 MG/3ML ~~LOC~~ SOPN: PEN_INJECTOR | SUBCUTANEOUS | 1 refills | Status: DC

## 2016-09-08 MED ORDER — CITALOPRAM HYDROBROMIDE 20 MG PO TABS: 30.0000 mg | ORAL_TABLET | Freq: Every day | ORAL | 1 refills | Status: DC

## 2016-09-08 MED FILL — VICTOZA 18 MG/3 ML INJECT P: 18 | 90 days supply | Qty: 18 | Fill #0

## 2016-09-08 MED FILL — UNIFINE PENTIPS 32GX5/32": 32G X 4 MM | 90 days supply | Qty: 100 | Fill #0

## 2016-09-08 MED FILL — UNIFINE PENTIPS 32GX5/32: 32G X 4 MM | 90 days supply | Qty: 100 | Fill #0

## 2016-09-08 NOTE — Progress Notes (Signed)
   Subjective:    Patient ID: Amy Dean, female    DOB: July 08, 1965, 51 y.o.   MRN: [id number]  HPIFollow up ED visit for vertigo. Treated with antivert.  This patient comes in today because she was in the ER for vertigo she woke up early in the morning she got up out of bed and she states her room is spinning and she was staggering and having a hard time walking she was finding herself leaning to one side she was alarmed by this went to the ER was worked up for a possibility of stroke CAT scan lab work looked good she was diagnosed with vertigo and sent home on medicine she states she is improved somewhat sensitive but still has sensation of the room moving when she turns quickly or stands up quickly she denies unilateral numbness or weakness She also states she gets occasional regurgitation issues She has not followed up recently for blood pressure or diabetes. She does state she takes her blood pressure medicine on a regular basis watches salt benign Takes her diabetic medicine watches sugars in her diet She is not able to exercise much because of her work schedule She does take her Celexa and her moods overall are doing well Her reflux medicine does do well   Review of Systems     Objective:   Physical Exam Pupils responsive to light difficult to see optic disc but what I can see looks sharp EOMI no nystagmus finger to nose normal Romberg negative lungs clear heart regular no unilateral numbness or weakness       Assessment & Plan:  Blood pressure good control continue current measures watch salt exercise try to lose weight Reflux disease continue acid blocker. Watch diet Diabetes lab work ordered previous labs reviewed continue medication watch diet Hyperlipidemia previous lab reviewed continue medication watch diet Vertigo meclizine when necessary no sign of strep throat going on this should gradually get better if ongoing trouble we will refer to ENT

## 2016-09-09 DIAGNOSIS — E669 Obesity, unspecified: Secondary | ICD-10-CM | POA: Insufficient documentation

## 2016-09-09 LAB — BASIC METABOLIC PANEL
BUN/Creatinine Ratio: 11 (ref 9–23)
BUN: 9 mg/dL (ref 6–24)
CO2: 27 mmol/L (ref 18–29)
Calcium: 10.2 mg/dL (ref 8.7–10.2)
Chloride: 100 mmol/L (ref 96–106)
Creatinine, Ser: 0.8 mg/dL (ref 0.57–1.00)
GFR calc Af Amer: 98 mL/min/{1.73_m2} (ref >59)
GFR calc non Af Amer: 85 mL/min/{1.73_m2} (ref >59)
Glucose: 110 mg/dL — ABNORMAL HIGH (ref 65–99)
Potassium: 4.3 mmol/L (ref 3.5–5.2)
Sodium: 141 mmol/L (ref 134–144)

## 2016-09-09 LAB — HEPATIC FUNCTION PANEL
ALT: 30 IU/L (ref 0–32)
AST: 30 IU/L (ref 0–40)
Albumin: 4.6 g/dL (ref 3.5–5.5)
Alkaline Phosphatase: 72 IU/L (ref 39–117)
Bilirubin Total: 0.7 mg/dL (ref 0.0–1.2)
Bilirubin, Direct: 0.17 mg/dL (ref 0.00–0.40)
Total Protein: 7.3 g/dL (ref 6.0–8.5)

## 2016-09-09 LAB — HEMOGLOBIN A1C
Est. average glucose Bld gHb Est-mCnc: 143 mg/dL
Hgb A1c MFr Bld: 6.6 % — ABNORMAL HIGH (ref 4.8–5.6)

## 2016-09-09 LAB — MICROALBUMIN / CREATININE URINE RATIO
Creatinine, Urine: 226.6 mg/dL
Microalb/Creat Ratio: 6.6 mg/g creat (ref 0.0–30.0)
Microalbumin, Urine: 14.9 ug/mL

## 2016-09-09 LAB — LIPID PANEL
Chol/HDL Ratio: 4.7 ratio — ABNORMAL HIGH (ref 0.0–4.4)
Cholesterol, Total: 198 mg/dL (ref 100–199)
HDL: 42 mg/dL (ref >39)
LDL Calculated: 120 mg/dL — ABNORMAL HIGH (ref 0–99)
Triglycerides: 181 mg/dL — ABNORMAL HIGH (ref 0–149)
VLDL Cholesterol Cal: 36 mg/dL (ref 5–40)

## 2016-10-03 MED FILL — ALPRAZolam 0.5 MG TABS: 0.5 | 30 days supply | Qty: 30 | Fill #5

## 2016-10-03 MED FILL — UNIFINE PENTIPS 32GX5/32": 32G X 4 MM | 90 days supply | Qty: 100 | Fill #2

## 2016-10-03 MED FILL — UNIFINE PENTIPS 32GX5/32: 32G X 4 MM | 90 days supply | Qty: 100 | Fill #2

## 2016-10-09 MED ORDER — PRAVASTATIN SODIUM 40 MG PO TABS: 40.0000 mg | ORAL_TABLET | Freq: Every day | ORAL | 1 refills | Status: DC

## 2016-10-09 NOTE — Addendum Note (Signed)
Addended by: Dairl Ponder on: 10/09/2016 03:28 PM   Modules accepted: Orders

## 2016-10-12 MED FILL — ACCU-CHEK GUIDE TEST STRIP: 50 days supply | Qty: 100 | Fill #1

## 2016-10-12 MED FILL — ACCU-CHEK FASTCLIX LANCETS: 50 days supply | Qty: 102 | Fill #1

## 2016-10-18 ENCOUNTER — Other Ambulatory Visit: Payer: Self-pay | Admitting: *Deleted

## 2016-10-18 NOTE — Patient Outreach (Signed)
Amy Dean stopped by as she needed another [employer] activity tracker so one was provided and it was paired to her phone. Notified [employer] support. Barrington Ellison RN,CCM,CDE [place] Management Coordinator [facility] [employer] [phone] Office Fax [phone]

## 2016-11-02 MED FILL — PRAVASTATIN SODIUM 20 MG TA: 20 | 90 days supply | Qty: 90 | Fill #0

## 2016-11-02 MED FILL — ALPRAZolam 0.5 MG TABS: 0.5 | 30 days supply | Qty: 30 | Fill #0

## 2016-12-19 MED FILL — UNIFINE PENTIPS 32GX5/32: 32G X 4 MM | 90 days supply | Qty: 100 | Fill #3

## 2016-12-19 MED FILL — UNIFINE PENTIPS 32GX5/32": 32G X 4 MM | 90 days supply | Qty: 100 | Fill #3

## 2017-01-01 MED FILL — CITALOPRAM HBR 20 MG TABLET: 20 | 90 days supply | Qty: 135 | Fill #0

## 2017-01-01 MED FILL — ALPRAZolam 0.5 MG TABS: 0.5 | 30 days supply | Qty: 30 | Fill #1

## 2017-01-01 MED FILL — LISINOPRIL-HCTZ 20-12.5 MG: 20-12.5 | 90 days supply | Qty: 90 | Fill #0

## 2017-01-01 MED FILL — metFORMIN HCL 500 MG TABS: 500 | 90 days supply | Qty: 180 | Fill #0

## 2017-01-01 MED FILL — TRIAMCINOLONE 0.1% CREAM: 0.1 | 10 days supply | Qty: 80 | Fill #1

## 2017-01-01 MED FILL — AMLODIPINE BESYLATE 5 MG TA: 5 | 90 days supply | Qty: 90 | Fill #0

## 2017-01-03 ENCOUNTER — Telehealth: Payer: Self-pay | Admitting: Family Medicine

## 2017-01-03 NOTE — Telephone Encounter (Signed)
Tried to call no answer. Voicemail full. Need to confirm dose pt is taking. 40 mg in chart.

## 2017-01-03 NOTE — Telephone Encounter (Signed)
Patient is requesting Rx for Pravastatin.  She said the pharmacy does not have the Rx on file with the increased dose she is taking so they will not fill it.  [facility]

## 2017-01-04 NOTE — Telephone Encounter (Signed)
Left message to return call 

## 2017-01-05 MED ORDER — PRAVASTATIN SODIUM 40 MG PO TABS: 40.0000 mg | ORAL_TABLET | Freq: Every day | ORAL | 1 refills | Status: DC

## 2017-01-05 NOTE — Telephone Encounter (Signed)
Prescription sent electronically to pharmacy. Patient notified. 

## 2017-01-05 NOTE — Telephone Encounter (Signed)
Patient states she is on 40mg  of the pravastatin

## 2017-01-12 ENCOUNTER — Telehealth: Payer: Self-pay | Admitting: Family Medicine

## 2017-01-12 MED ORDER — PRAVASTATIN SODIUM 40 MG PO TABS: 40.0000 mg | ORAL_TABLET | Freq: Every day | ORAL | 1 refills | Status: DC

## 2017-01-12 MED FILL — PRAVASTATIN NA 40 MG TAB: 40 | 90 days supply | Qty: 90 | Fill #0

## 2017-01-12 NOTE — Telephone Encounter (Signed)
Patient had Rx for Pravastatin called in on 01/05/17 to [facility], but the pharmacy told her they did not have it.  She would like this resent.

## 2017-01-12 NOTE — Telephone Encounter (Signed)
Spoke with patient and informed her that her pravastatin was resent to pharmacy. Patient verbalized understanding.

## 2017-01-16 ENCOUNTER — Other Ambulatory Visit: Payer: Self-pay | Admitting: *Deleted

## 2017-01-16 NOTE — Patient Outreach (Signed)
[place] [facility]) Care Management   01/16/2017  KYMIA SIMI 1965/05/24 [id number]  TYMIRA HORKEY is an 51 y.o. female who presents to the [place] Management office with her coworker and friend Merita Norton to receive more information on the [employer] flash glucose monitoring system.   Subjective: Aeliana has no complaints. She provided verbal permission for [facility] [employer] student nurse Clydell Hakim to observe visit.  Objective:   Review of Systems  Constitutional: Negative.     Physical Exam  Constitutional: She is oriented to person, place, and time. She appears well-developed and well-nourished.  Respiratory: Effort normal.  Neurological: She is alert and oriented to person, place, and time.  Skin: Skin is warm and dry.  Psychiatric: She has a normal mood and affect. Her behavior is normal. Judgment and thought content normal.   Weight= 201 lbs per [employer]  Encounter Medications:     Functional Status:   In your present state of health, do you have any difficulty performing the following activities: 04/10/2016  Hearing? N  Vision? N  Difficulty concentrating or making decisions? N  Walking or climbing stairs? N  Dressing or bathing? N  Doing errands, shopping? N  Some recent data might be hidden    Fall/Depression Screening:    PHQ 2/9 Scores 09/08/2016 03/13/2015 08/31/2014  PHQ - 2 Score 0 - 0  Exception Documentation - Patient refusal -    Assessment: [place] is requesting information about the [employer] Freestyle Libre flash glucose monitoring system.   Plan:      [facility] CM Care Plan Problem One   Flowsheet Row Most Recent Value  Care Plan Problem One Knowledge deficit related to the [facility] flash glucose monitoring system  Role Documenting the Problem One  Care Management Coordinator  Care Plan for Problem One  Active  THN Long Term Goal (31-90 days) Patient will voice understanding of the  Freestyle Libre flash glucose monitoring system and contact this RNCM for any questions  THN Long Term Goal Start Date  01/16/17  Interventions for Problem One Long Term Goal Using the demonstration kit- discussed elements of the Freestyle Libre flash glucose system, how to apply sensor, sensor warm up period, sensor life,  how to scan sensor using reader and how to manually enter glucose reading into [place], how to view interstitial glucose data from reader and  the out of pocket cost for the system, also provided an informational brochure about the [employer], reminded Hadlea that she is not on the highest dose of Victoza and to discuss increasing the dose to 1.8 mg daily if she notices an increase in her blood sugars or needs assist with weight loss, will continue to monitor Tatisha's DM self management skills via the [place] will fax today's note to primary care provider.    Barrington Ellison RN,CCM,CDE [place] Management Coordinator Link To Wellness Office Phone [phone] Office Fax [phone]

## 2017-01-18 ENCOUNTER — Encounter: Payer: Self-pay | Admitting: *Deleted

## 2017-01-24 ENCOUNTER — Ambulatory Visit: Payer: Self-pay | Admitting: *Deleted

## 2017-01-24 ENCOUNTER — Other Ambulatory Visit: Payer: Self-pay | Admitting: *Deleted

## 2017-01-24 NOTE — Patient Outreach (Signed)
Amy Dean messaged this RNCM in the [employer] on 01/19/17 stating her glucometer is requiring batteries frequently so this RNCM suggested the glucometer be replaced. Amy Dean came by the [place] office and the glucometer was replaced, paired to her phone, POC CBG check was done to ensure the data transfer form her phone to the [facility] app confirmed. Also advised her if the glucometer batteries continue to require replacement , the [facility] CM office will replace them for her at no cost.  Will continue to monitor Amy Dean's DM self management skills via the [facility] platform. Barrington Ellison RN,CCM,CDE [place] Management Coordinator Link To Wellness and [employer] [phone] Office Fax [phone]

## 2017-01-30 ENCOUNTER — Other Ambulatory Visit: Payer: Self-pay | Admitting: Family Medicine

## 2017-01-30 DIAGNOSIS — Z1231 Encounter for screening mammogram for malignant neoplasm of breast: Secondary | ICD-10-CM

## 2017-02-08 ENCOUNTER — Telehealth: Payer: Self-pay | Admitting: Family Medicine

## 2017-02-08 MED FILL — ACCU-CHEK GUIDE TEST STRIP: 50 days supply | Qty: 100 | Fill #2

## 2017-02-08 NOTE — Telephone Encounter (Signed)
Pt is needing a freestyle libre meter sent in.     [facility]

## 2017-02-09 NOTE — Telephone Encounter (Signed)
Hand written rx awaiting signature.

## 2017-02-09 NOTE — Telephone Encounter (Signed)
Rx submitted to [facility] out pt pharmacy.

## 2017-02-09 NOTE — Telephone Encounter (Signed)
May go ahead and send in prescription also testing is typically 1 time a day for someone who is not on insulin

## 2017-02-12 MED FILL — ALPRAZolam 0.5 MG TABS: 0.5 | 30 days supply | Qty: 30 | Fill #2

## 2017-02-19 ENCOUNTER — Encounter: Payer: Self-pay | Admitting: Family Medicine

## 2017-02-19 ENCOUNTER — Ambulatory Visit (INDEPENDENT_AMBULATORY_CARE_PROVIDER_SITE_OTHER): Payer: 59 | Admitting: Family Medicine

## 2017-02-19 VITALS — BP 130/88 | Temp 98.6°F | Ht 64.0 in | Wt 193.0 lb

## 2017-02-19 DIAGNOSIS — J019 Acute sinusitis, unspecified: Secondary | ICD-10-CM

## 2017-02-19 DIAGNOSIS — B349 Viral infection, unspecified: Secondary | ICD-10-CM | POA: Diagnosis not present

## 2017-02-19 MED ORDER — HYDROCODONE-HOMATROPINE 5-1.5 MG/5ML PO SYRP: ORAL_SOLUTION | ORAL | 0 refills | Status: DC

## 2017-02-19 MED ORDER — AMOXICILLIN 500 MG PO TABS
500.0000 mg | ORAL_TABLET | Freq: Three times a day (TID) | ORAL | 0 refills | Status: DC
Start: 2017-02-19 — End: 2017-05-10

## 2017-02-19 NOTE — Progress Notes (Signed)
   Subjective:    Patient ID: Amy Dean, female    DOB: 19-Jan-1966, 51 y.o.   MRN: [id number]  Cough  This is a new problem. Associated symptoms include rhinorrhea. Pertinent negatives include no chest pain, ear pain, fever, shortness of breath or wheezing.  The patient states she woke up Saturday with cough,congestion,headache,body aches,ears hear.Took nyquil,coricden,which have helped some. Significant head congestion drainage coughing sinus pressure drainage causing some difficulty with speaking denies wheezing or shortness of breath PMH benign diabetic  Review of Systems  Constitutional: Negative for activity change and fever.  HENT: Positive for congestion and rhinorrhea. Negative for ear pain.   Eyes: Negative for discharge.  Respiratory: Positive for cough. Negative for shortness of breath and wheezing.   Cardiovascular: Negative for chest pain.       Objective:   Physical Exam  Constitutional: She appears well-developed.  HENT:  Head: Normocephalic.  Right Ear: External ear normal.  Left Ear: External ear normal.  Nose: Nose normal.  Mouth/Throat: Oropharynx is clear and moist. No oropharyngeal exudate.  Eyes: Right eye exhibits no discharge. Left eye exhibits no discharge.  Neck: Neck supple. No tracheal deviation present.  Cardiovascular: Normal rate and normal heart sounds.   No murmur heard. Pulmonary/Chest: Effort normal and breath sounds normal. She has no wheezes. She has no rales.  Lymphadenopathy:    She has no cervical adenopathy.  Skin: Skin is warm and dry.  Nursing note and vitals reviewed.  Patient not toxic. No sign and pneumonia or meningitis.       Assessment & Plan:  Patient was seen today for upper respiratory illness. It is felt that the patient is dealing with sinusitis. Antibiotics were prescribed today. Importance of compliance with medication was discussed. Symptoms should gradually resolve over the course of the next several days.  If high fevers, progressive illness, difficulty breathing, worsening condition or failure for symptoms to improve over the next several days then the patient is to follow-up. If any emergent conditions the patient is to follow-up in the emergency department otherwise to follow-up in the office.  We did discuss freestyle Libre-given that the patient is not on insulin she opts to do random sampling a little less frequently and she will let us know how his readings are-patient due for diabetic check later this fall  Viral illness secondary rhinosinusitis cough medicine for nighttime use only caution drowsiness follow-up if ongoing trouble amoxicillin prescribed

## 2017-02-20 ENCOUNTER — Telehealth: Payer: Self-pay | Admitting: Family Medicine

## 2017-02-20 MED ORDER — BENZONATATE 100 MG PO CAPS: ORAL_CAPSULE | ORAL | 0 refills | Status: DC

## 2017-02-20 NOTE — Telephone Encounter (Signed)
So in this situation I recommend stopping the Hycodan. She can discard it by pouring it into some coffee grounds and throwing it in the trash. May use Tessalon 100 mg 1 3 times a day when necessary cough #21

## 2017-02-20 NOTE — Telephone Encounter (Signed)
Left message return call 02/20/17

## 2017-02-20 NOTE — Telephone Encounter (Signed)
Patient was seen yesterday and prescribed hycodan cough syrup. She states cant take this make her heart race. Can you call in something else to [facility].

## 2017-02-26 ENCOUNTER — Other Ambulatory Visit: Payer: Self-pay | Admitting: *Deleted

## 2017-02-26 NOTE — Patient Outreach (Signed)
Amy Dean stopped by the [place] office requesting assist with her phone as she bought a new phone and has a new phone number and is unable to access the [employer] so she is asking for assistance. After multiple unsuccessful attempts to connect to [facility], advised Azuree this RNCM will e-mail [employer] support and ask support to contact [place] to help her. Barrington Ellison RN,CCM,CDE [place] Management Coordinator Link To Wellness and [employer] [phone] Office Fax [phone]

## 2017-02-27 NOTE — Telephone Encounter (Signed)
Patient picked up her prescription on 02/20/17 per pharmacy.

## 2017-02-28 ENCOUNTER — Telehealth: Payer: Self-pay | Admitting: Family Medicine

## 2017-02-28 NOTE — Telephone Encounter (Signed)
Patient's freestyle Amy Dean was denied by [employer] due to not meeting guidelines for the device. Patient must utilize 3 or more daily injections of insulin, or have infusion pump, must perform at least 4 finger sticks for glucose daily. Letter was mailed to patient via [place].

## 2017-03-12 ENCOUNTER — Other Ambulatory Visit: Payer: Self-pay | Admitting: Family Medicine

## 2017-03-12 MED FILL — OMEGA-3 ETHYL ESTERS 1 GM C: 1 | 90 days supply | Qty: 180 | Fill #0

## 2017-03-12 MED FILL — ALPRAZolam 0.5 MG TABS: 0.5 | 30 days supply | Qty: 30 | Fill #0

## 2017-03-14 ENCOUNTER — Encounter (HOSPITAL_COMMUNITY): Payer: Self-pay

## 2017-03-14 ENCOUNTER — Ambulatory Visit (HOSPITAL_COMMUNITY)
Admission: RE | Admit: 2017-03-14 | Discharge: 2017-03-14 | Disposition: A | Discharge status: Home / Self Care | Payer: 59 | Source: Ambulatory Visit | Attending: Family Medicine | Admitting: Family Medicine

## 2017-03-14 ENCOUNTER — Other Ambulatory Visit: Payer: Self-pay | Admitting: Family Medicine

## 2017-03-14 ENCOUNTER — Ambulatory Visit (HOSPITAL_COMMUNITY): Payer: Self-pay

## 2017-03-14 DIAGNOSIS — Z1231 Encounter for screening mammogram for malignant neoplasm of breast: Secondary | ICD-10-CM

## 2017-03-14 MED FILL — metFORMIN HCL 500 MG TABS: 500 | 90 days supply | Qty: 180 | Fill #1

## 2017-03-14 MED FILL — CITALOPRAM HBR 20 MG TABLET: 20 | 90 days supply | Qty: 135 | Fill #1

## 2017-04-11 ENCOUNTER — Other Ambulatory Visit: Payer: Self-pay | Admitting: *Deleted

## 2017-04-11 NOTE — Patient Outreach (Signed)
Amazing transitioned from the [employer] To Wellness program to the [employer] on 05/26/16 for Type II diabetes self-management assistance so will close case to the diabetes Link To Wellness program due to delegation of disease management services to [employer] from [employer] for [place] members in 2019. Barrington Ellison RN,CCM,CDE [place] Management Coordinator Link To Wellness and [employer] [phone] Office Fax [phone]

## 2017-04-23 MED FILL — PRAVASTATIN NA 40 MG TAB: 40 | 90 days supply | Qty: 90 | Fill #1

## 2017-04-23 MED FILL — ALPRAZolam 0.5 MG TABS: 0.5 | 30 days supply | Qty: 30 | Fill #1

## 2017-04-23 MED FILL — AMLODIPINE BESYLATE 5 MG TA: 5 | 90 days supply | Qty: 90 | Fill #1

## 2017-04-24 ENCOUNTER — Other Ambulatory Visit: Payer: Self-pay | Admitting: Family Medicine

## 2017-04-25 NOTE — Telephone Encounter (Signed)
No further refills until patient schedules an office visit

## 2017-05-04 MED FILL — VICTOZA 18 MG/3 ML INJECT P: 18 | 45 days supply | Qty: 9 | Fill #1

## 2017-05-09 DIAGNOSIS — G43909 Migraine, unspecified, not intractable, without status migrainosus: Secondary | ICD-10-CM | POA: Diagnosis not present

## 2017-05-10 ENCOUNTER — Ambulatory Visit (INDEPENDENT_AMBULATORY_CARE_PROVIDER_SITE_OTHER): Payer: 59 | Admitting: Family Medicine

## 2017-05-10 ENCOUNTER — Encounter: Payer: Self-pay | Admitting: Family Medicine

## 2017-05-10 VITALS — BP 122/82 | Temp 98.7°F | Ht 64.0 in | Wt 200.2 lb

## 2017-05-10 DIAGNOSIS — R519 Headache, unspecified: Secondary | ICD-10-CM

## 2017-05-10 DIAGNOSIS — R51 Headache: Secondary | ICD-10-CM

## 2017-05-10 DIAGNOSIS — I10 Essential (primary) hypertension: Secondary | ICD-10-CM

## 2017-05-10 MED ORDER — HYDROCODONE-ACETAMINOPHEN 5-325 MG PO TABS: 1.0000 | ORAL_TABLET | ORAL | 0 refills | Status: DC | PRN

## 2017-05-10 MED ORDER — AMLODIPINE BESYLATE 10 MG PO TABS: 10.0000 mg | ORAL_TABLET | Freq: Every day | ORAL | 5 refills | Status: DC

## 2017-05-10 NOTE — Progress Notes (Addendum)
   Subjective:    Patient ID: Amy Dean, female    DOB: [birth year], 52 y.o.   MRN: [id number]  HPI  Patient arrives with c/o headache, swollen glands and back pain for few days. Patient relates that she has been having a moderate to severe headache for about 2 weeks initially the headache started off mild movement of the course of the week started becoming more severe.  There was no findings consistent with aneurysm rupture.  No findings consistent with subarachnoid hemorrhage.  She denies any unilateral numbness or weakness she states the headaches start in the back of her head and become global they cause her to feel nauseous and also have caused blurred vision in addition to this she states that she has woken up several times with the headaches with nausea to the point of almost throwing up she does not have a history of this type of headaches she denies any excessive stress denies fever chills sweats denies sinus symptoms there is been no change in medications or diet she denies being depressed  The patient went to local urgent care yesterday was told that she had a migraine but had a very limited exam therefore she called [place] to be seen today Review of Systems Please see above    Objective:   Physical Exam Pupils responsive to light optic disc sharp eardrums are normal neck no masses lungs are clear no crackles heart is regular no murmurs grossly normal strength bilateral patient denies numbness Blood pressure significantly elevated 170/102       Assessment & Plan:  HTN-subpar control increase amlodipine to 10 mg daily I doubt that this elevated blood pressure is because of her headaches this will need to be followed up within the next 10-14 days  Severe headaches-this is not typical for this patient although it could be blood pressure related the fact that it is waking her up at night along with nausea to the point of almost throwing up it is important for this patient to go  ahead and be further evaluated with an MRI await the results may need referral to neurology may need headache preventative medicine patient may use hydrocodone for severe pain cautioned drowsiness for infrequent use  If the patient starts having severe headaches with vomiting or unilateral numbness weakness immediately go to ER

## 2017-05-10 NOTE — Addendum Note (Signed)
Addended by: Sallee Lange A on: 05/10/2017 09:35 PM   Modules accepted: Level of Service

## 2017-05-11 ENCOUNTER — Other Ambulatory Visit: Payer: Self-pay

## 2017-05-11 ENCOUNTER — Inpatient Hospital Stay (HOSPITAL_COMMUNITY)
Admission: EM | Admit: 2017-05-11 | Discharge: 2017-05-16 | DRG: 098 | Disposition: A | Discharge status: Home / Self Care | Payer: 59 | Attending: Internal Medicine | Admitting: Internal Medicine

## 2017-05-11 ENCOUNTER — Telehealth: Payer: Self-pay | Admitting: Family Medicine

## 2017-05-11 ENCOUNTER — Encounter (HOSPITAL_COMMUNITY): Payer: Self-pay | Admitting: Emergency Medicine

## 2017-05-11 ENCOUNTER — Emergency Department (HOSPITAL_COMMUNITY): Payer: 59

## 2017-05-11 ENCOUNTER — Ambulatory Visit (HOSPITAL_COMMUNITY)
Admission: RE | Admit: 2017-05-11 | Discharge: 2017-05-11 | Disposition: A | Discharge status: Home / Self Care | Payer: 59 | Source: Ambulatory Visit | Attending: Family Medicine | Admitting: Family Medicine

## 2017-05-11 DIAGNOSIS — G030 Nonpyogenic meningitis: Secondary | ICD-10-CM | POA: Diagnosis not present

## 2017-05-11 DIAGNOSIS — H9192 Unspecified hearing loss, left ear: Secondary | ICD-10-CM | POA: Diagnosis present

## 2017-05-11 DIAGNOSIS — I10 Essential (primary) hypertension: Secondary | ICD-10-CM | POA: Diagnosis present

## 2017-05-11 DIAGNOSIS — R402413 Glasgow coma scale score 13-15, at hospital admission: Secondary | ICD-10-CM | POA: Diagnosis present

## 2017-05-11 DIAGNOSIS — R9389 Abnormal findings on diagnostic imaging of other specified body structures: Secondary | ICD-10-CM | POA: Diagnosis not present

## 2017-05-11 DIAGNOSIS — A86 Unspecified viral encephalitis: Secondary | ICD-10-CM | POA: Diagnosis present

## 2017-05-11 DIAGNOSIS — R918 Other nonspecific abnormal finding of lung field: Secondary | ICD-10-CM | POA: Diagnosis present

## 2017-05-11 DIAGNOSIS — R9089 Other abnormal findings on diagnostic imaging of central nervous system: Secondary | ICD-10-CM

## 2017-05-11 DIAGNOSIS — G894 Chronic pain syndrome: Secondary | ICD-10-CM | POA: Diagnosis present

## 2017-05-11 DIAGNOSIS — E119 Type 2 diabetes mellitus without complications: Secondary | ICD-10-CM | POA: Diagnosis not present

## 2017-05-11 DIAGNOSIS — R590 Localized enlarged lymph nodes: Secondary | ICD-10-CM | POA: Diagnosis present

## 2017-05-11 DIAGNOSIS — A879 Viral meningitis, unspecified: Secondary | ICD-10-CM | POA: Diagnosis present

## 2017-05-11 DIAGNOSIS — K5900 Constipation, unspecified: Secondary | ICD-10-CM | POA: Diagnosis present

## 2017-05-11 DIAGNOSIS — I776 Arteritis, unspecified: Secondary | ICD-10-CM | POA: Diagnosis not present

## 2017-05-11 DIAGNOSIS — Z79899 Other long term (current) drug therapy: Secondary | ICD-10-CM

## 2017-05-11 DIAGNOSIS — R935 Abnormal findings on diagnostic imaging of other abdominal regions, including retroperitoneum: Secondary | ICD-10-CM | POA: Diagnosis not present

## 2017-05-11 DIAGNOSIS — R51 Headache: Secondary | ICD-10-CM | POA: Insufficient documentation

## 2017-05-11 DIAGNOSIS — R519 Headache, unspecified: Secondary | ICD-10-CM | POA: Diagnosis present

## 2017-05-11 DIAGNOSIS — E785 Hyperlipidemia, unspecified: Secondary | ICD-10-CM | POA: Diagnosis present

## 2017-05-11 DIAGNOSIS — Z87891 Personal history of nicotine dependence: Secondary | ICD-10-CM

## 2017-05-11 DIAGNOSIS — Z7984 Long term (current) use of oral hypoglycemic drugs: Secondary | ICD-10-CM

## 2017-05-11 DIAGNOSIS — H538 Other visual disturbances: Secondary | ICD-10-CM | POA: Diagnosis present

## 2017-05-11 DIAGNOSIS — G43909 Migraine, unspecified, not intractable, without status migrainosus: Secondary | ICD-10-CM | POA: Diagnosis not present

## 2017-05-11 LAB — COMPREHENSIVE METABOLIC PANEL
ALT: 20 U/L (ref 14–54)
AST: 27 U/L (ref 15–41)
Albumin: 4.4 g/dL (ref 3.5–5.0)
Alkaline Phosphatase: 59 U/L (ref 38–126)
Anion gap: 11 (ref 5–15)
BUN: 5 mg/dL — ABNORMAL LOW (ref 6–20)
CO2: 23 mmol/L (ref 22–32)
Calcium: 9.7 mg/dL (ref 8.9–10.3)
Chloride: 104 mmol/L (ref 101–111)
Creatinine, Ser: 0.7 mg/dL (ref 0.44–1.00)
GFR calc Af Amer: >60 mL/min (ref >60)
GFR calc non Af Amer: >60 mL/min (ref >60)
Glucose, Bld: 106 mg/dL — ABNORMAL HIGH (ref 65–99)
Potassium: 3.7 mmol/L (ref 3.5–5.1)
Sodium: 138 mmol/L (ref 135–145)
Total Bilirubin: 0.9 mg/dL (ref 0.3–1.2)
Total Protein: 7.4 g/dL (ref 6.5–8.1)

## 2017-05-11 LAB — CBC WITH DIFFERENTIAL/PLATELET
Basophils Absolute: 0 10*3/uL (ref 0.0–0.1)
Basophils Relative: 1 %
Eosinophils Absolute: 0.1 10*3/uL (ref 0.0–0.7)
Eosinophils Relative: 2 %
HCT: 40.9 % (ref 36.0–46.0)
Hemoglobin: 14.3 g/dL (ref 12.0–15.0)
Lymphocytes Relative: 41 %
Lymphs Abs: 1.8 10*3/uL (ref 0.7–4.0)
MCH: 30.9 pg (ref 26.0–34.0)
MCHC: 35 g/dL (ref 30.0–36.0)
MCV: 88.3 fL (ref 78.0–100.0)
Monocytes Absolute: 0.5 10*3/uL (ref 0.1–1.0)
Monocytes Relative: 11 %
Neutro Abs: 1.9 10*3/uL (ref 1.7–7.7)
Neutrophils Relative %: 45 %
Platelets: 193 10*3/uL (ref 150–400)
RBC: 4.63 MIL/uL (ref 3.87–5.11)
RDW: 12.5 % (ref 11.5–15.5)
WBC: 4.3 10*3/uL (ref 4.0–10.5)

## 2017-05-11 LAB — CSF CELL COUNT WITH DIFFERENTIAL
Eosinophils, CSF: 0 % (ref 0–1)
Eosinophils, CSF: 0 % (ref 0–1)
Lymphs, CSF: 95 % — ABNORMAL HIGH (ref 40–80)
Lymphs, CSF: 96 % — ABNORMAL HIGH (ref 40–80)
Monocyte-Macrophage-Spinal Fluid: 4 % — ABNORMAL LOW (ref 15–45)
Monocyte-Macrophage-Spinal Fluid: 5 % — ABNORMAL LOW (ref 15–45)
Other Cells, CSF: 0
Other Cells, CSF: 0
RBC Count, CSF: 4 /mm3 — ABNORMAL HIGH
RBC Count, CSF: 47 /mm3 — ABNORMAL HIGH
Segmented Neutrophils-CSF: 0 % (ref 0–6)
Segmented Neutrophils-CSF: 0 % (ref 0–6)
Tube #: 1
Tube #: 4
WBC, CSF: 26 /mm3 (ref 0–5)
WBC, CSF: 31 /mm3 (ref 0–5)

## 2017-05-11 LAB — CRYPTOCOCCAL ANTIGEN, CSF: Crypto Ag: NEGATIVE

## 2017-05-11 LAB — C-REACTIVE PROTEIN: CRP: <0.8 mg/dL (ref <1.0)

## 2017-05-11 LAB — POCT I-STAT CREATININE: Creatinine, Ser: 0.7 mg/dL (ref 0.44–1.00)

## 2017-05-11 LAB — GLUCOSE, CSF: Glucose, CSF: 51 mg/dL (ref 40–70)

## 2017-05-11 LAB — SEDIMENTATION RATE: Sed Rate: 3 mm/hr (ref 0–22)

## 2017-05-11 LAB — PROTEIN, CSF: Total  Protein, CSF: 69 mg/dL — ABNORMAL HIGH (ref 15–45)

## 2017-05-11 MED ORDER — FENTANYL CITRATE (PF) 100 MCG/2ML IJ SOLN
50.0000 ug | Freq: Once | INTRAMUSCULAR | Status: AC
  Administered 2017-05-11: 50 ug via INTRAVENOUS
  Filled 2017-05-11: qty 2

## 2017-05-11 MED ORDER — FENTANYL CITRATE (PF) 100 MCG/2ML IJ SOLN
50.0000 ug | INTRAMUSCULAR | Status: DC | PRN
  Administered 2017-05-11: 50 ug via INTRAVENOUS
  Filled 2017-05-11: qty 2

## 2017-05-11 MED ORDER — GADOBENATE DIMEGLUMINE 529 MG/ML IV SOLN
20.0000 mL | Freq: Once | INTRAVENOUS | Status: AC | PRN
  Administered 2017-05-11: 20 mL via INTRAVENOUS

## 2017-05-11 MED ORDER — ONDANSETRON HCL 4 MG/2ML IJ SOLN
4.0000 mg | Freq: Once | INTRAMUSCULAR | Status: AC
  Administered 2017-05-11: 4 mg via INTRAVENOUS
  Filled 2017-05-11: qty 2

## 2017-05-11 MED ORDER — LIDOCAINE HCL (PF) 1 % IJ SOLN
INTRAMUSCULAR | Status: AC
  Filled 2017-05-11: qty 5

## 2017-05-11 MED ORDER — LIDOCAINE HCL (PF) 1 % IJ SOLN
INTRAMUSCULAR | Status: AC
  Filled 2017-05-11: qty 30

## 2017-05-11 NOTE — ED Notes (Signed)
Pt. To LP via stretcher.

## 2017-05-11 NOTE — ED Notes (Signed)
Main lab called,  Need gold top

## 2017-05-11 NOTE — ED Provider Notes (Signed)
Patient's primary care doctor, Lance Sell,  from [facility] called regarding this patient.  He stated that he saw the patient yesterday with a 2-week history of headache and vision changes. She had an outpatient MRI today which showed increased uptake with concern for possible meningitis.  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   Franchot Heidelberg, PA-C 05/11/17 Eleva, Julie, MD 05/11/17 [phone]

## 2017-05-11 NOTE — ED Triage Notes (Signed)
Pt reports recent migraines with changes to vision, reports L eye burred since yesterday, denies n/v/photosensitivity.  Pt states PCP sent her for LP. VAN negative in triage, no neck pain, no focal deficits noted.

## 2017-05-11 NOTE — ED Notes (Signed)
Nurse collecting 2 gold top tubes and will send to lab.

## 2017-05-11 NOTE — Telephone Encounter (Signed)
Let me speak with pt asap about MRI

## 2017-05-11 NOTE — ED Provider Notes (Addendum)
[facility] EMERGENCY DEPARTMENT Provider Note   CSN: [id number] Arrival date & time: 05/11/17  1213     History   Chief Complaint Chief Complaint  Patient presents with  . Migraine    HPI Amy Dean is a 52 y.o. female. Chief complaint is headache, abnormal MRI.  HPI:  52 year old female. 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 Presents with a 3 week history of headache.  Symptoms started mild, not suddenly 3 weeks ago. Noticed daily headache. Cannot localize one specific area states it is her entire head. It is persistent but at times is throbbing. She will take over-the-counter medicines with slight relief for only a few hours at a time. Seen in urgent care 2 days ago. States the provider there "rubbed on my arms and legs and told me that I had a migraine". He was prescribed sumatriptan. She is taken 4 doses with again only intermittent and incomplete relief.  Denies fever. Had some URI symptoms that were very mild. No frank sinus pain or pressure. No sore throat.  He states she had trouble with hearing in her left ear and vision in her right eye last weekend but this returned to normal. She states her vision in her right eye today was "fuzzy".  No history of HIV or immunosuppression. Denies history of IV drug use or high-risk sexual behavior or risks for HIV.  No fevers, night sweats, weight loss. No history of immune disorders.  Past Medical History:  Diagnosis Date  . Anemia    Resolved post hysterectomy  . Enteritis   . Essential hypertension   . Hyperlipidemia   . Type 2 diabetes mellitus [facility])     Patient Active Problem List   Diagnosis Date Noted  . Obesity (BMI 30.0-34.9) 09/09/2016  . Vertigo 09/08/2016  . Hypertriglyceridemia 01/12/2015  . Hyperlipidemia associated with type 2 diabetes mellitus ([place]) 04/01/2014  . Depression with anxiety 01/05/2014  . Morbid obesity ([place]) 07/17/2013  .  GERD (gastroesophageal reflux disease) 12/18/2011  . Nonspecific (abnormal) findings on radiological and other examination of gastrointestinal tract 10/26/2011  . Enteritis 10/04/2011  . Diabetes mellitus type II, controlled ([place]) 03/23/2011    Class: Chronic  . ANEMIA 12/10/2008  . Essential hypertension 12/10/2008  . DYSPHAGIA UNSPECIFIED 12/10/2008    Past Surgical History:  Procedure Laterality Date  . ABDOMINAL HYSTERECTOMY    . [place]   x2  . COLONOSCOPY N/A 04/22/2015   Procedure: COLONOSCOPY;  Surgeon: Rogene Houston, MD;  Location: [facility] ENDO SUITE;  Service: Endoscopy;  Laterality: N/A;  1:00  . EUS  10/26/2011   Procedure: UPPER ENDOSCOPIC ULTRASOUND (EUS) LINEAR;  Surgeon: Milus Banister, MD;  Location: [facility] ENDOSCOPY;  Service: Endoscopy;  Laterality: N/A;  . HYSTEROSCOPY W/ ENDOMETRIAL ABLATION  07/22/2008   K.Harrington Challenger MD [facility]  . SUPRACERVICAL ABDOMINAL HYSTERECTOMY  03/21/2011   Procedure: HYSTERECTOMY SUPRACERVICAL ABDOMINAL;  Surgeon: Jonnie Kind, MD;  Location: [facility] ORS;  Service: Gynecology;;  . TONSILLECTOMY    . TUBAL LIGATION      OB History    Gravida Para Term Preterm AB Living   3 2     1 2    SAB TAB Ectopic Multiple Live Births   1               Home Medications    Prior to Admission medications   Medication Sig Start Date End Date Taking? Authorizing  Provider  acetaminophen (TYLENOL) 500 MG tablet Take 500 mg by mouth as needed. Reported on 07/12/2015    [provider]  ALPRAZolam Duanne Moron) 0.5 MG tablet TAKE 1 TABLET BY MOUTH AT BEDTIME AS NEEDED FOR SLEEP 03/12/17   Nilda Simmer, NP  amLODipine (NORVASC) 10 MG tablet Take 1 tablet (10 mg total) by mouth daily. 05/10/17   Kathyrn Drown, MD  benzonatate (TESSALON) 100 MG capsule Take 1 tablet by mouth every 8 hours as needed 02/20/17   Kathyrn Drown, MD  cetirizine (ZYRTEC) 10 MG tablet Take 1 tablet (10 mg total) by mouth daily. 09/08/16   Kathyrn Drown, MD    cholecalciferol (VITAMIN D) 1000 units tablet Take 1,000 Units by mouth daily.    [provider]  citalopram (CELEXA) 20 MG tablet Take 1.5 tablets (30 mg total) by mouth daily. 09/08/16   Kathyrn Drown, MD  fexofenadine (ALLEGRA) 180 MG tablet Take 1 tablet (180 mg total) by mouth daily. 09/08/16   Kathyrn Drown, MD  fluticasone (FLONASE) 50 MCG/ACT nasal spray Place 2 sprays into both nostrils daily. 08/20/13   Nilda Simmer, NP  HYDROcodone-acetaminophen (NORCO/VICODIN) 5-325 MG tablet Take 1 tablet by mouth every 4 (four) hours as needed. 05/10/17   Kathyrn Drown, MD  Insulin Pen Needle (PEN NEEDLES 31GX5/16") 31G X 8 MM MISC Use daily as directed 07/16/13   Nilda Simmer, NP  ketoconazole (NIZORAL) 2 % cream Apply 1 application topically 2 (two) times daily. 07/25/16   Kathyrn Drown, MD  liraglutide (VICTOZA) 18 MG/3ML SOPN INJECT 1.2 MG SUBQ ONCE DAILY 09/08/16   Kathyrn Drown, MD  lisinopril-hydrochlorothiazide (PRINZIDE,ZESTORETIC) 20-12.5 MG tablet TAKE 1 TABLET BY MOUTH DAILY FOR FOR BLOOD PRESSURE 09/08/16   Kathyrn Drown, MD  meclizine (ANTIVERT) 25 MG tablet Take 1 tablet (25 mg total) by mouth 3 (three) times daily as needed for dizziness. 09/06/16   Lily Kocher, PA-C  metFORMIN (GLUCOPHAGE) 500 MG tablet TAKE 1 TABLET BY MOUTH 2 TIMES DAILY WITH A MEAL. 09/08/16   Kathyrn Drown, MD  naproxen sodium (ANAPROX) 220 MG tablet Take 220-440 mg by mouth daily as needed (for pain).    [provider]  omega-3 acid ethyl esters (LOVAZA) 1 g capsule Take 1 capsule (1 g total) by mouth 2 (two) times daily. 09/08/16   Kathyrn Drown, MD  pantoprazole (PROTONIX) 40 MG tablet TAKE 1 TABLET BY MOUTH ONCE DAILY FOR ACID REFLUX 09/08/16   Kathyrn Drown, MD  pravastatin (PRAVACHOL) 40 MG tablet Take 1 tablet (40 mg total) by mouth daily. 01/12/17   Kathyrn Drown, MD  triamcinolone cream (KENALOG) 0.1 % Apply 1 application topically 2 (two) times daily. 07/25/16   Kathyrn Drown, MD  VICTOZA 18 MG/3ML SOPN INJECT 1.2 MG SUBQ ONCE DAILY 04/03/16   Nilda Simmer, NP  Vitamin D, Ergocalciferol, (DRISDOL) 50000 UNITS CAPS capsule Take 1 capsule (50,000 Units total) by mouth every 7 (seven) days. 01/04/15   Nilda Simmer, NP    Family History Family History  Problem Relation Age of Onset  . Healthy Son   . Sarcoidosis Mother   . Heart attack Mother   . Stroke Mother   . Heart attack Paternal Uncle   . Hypertension Maternal Grandmother   . Anesthesia problems Neg Hx   . Hypotension Neg Hx   . Malignant hyperthermia Neg Hx   . Pseudochol deficiency Neg Hx  Social History Social History   Tobacco Use  . Smoking status: Former Smoker    Packs/day: 1.50  . Smokeless tobacco: Never Used  Substance Use Topics  . Alcohol use: Yes    Comment: occastionally on holidays; 2 drinks at a time  . Drug use: No     Allergies   Patient has no known allergies.   Review of Systems Review of Systems  Constitutional: Negative for appetite change, chills, diaphoresis, fatigue and fever.  HENT: Positive for hearing loss. Negative for mouth sores, sore throat and trouble swallowing.   Eyes: Positive for visual disturbance.  Respiratory: Negative for cough, chest tightness, shortness of breath and wheezing.   Cardiovascular: Negative for chest pain.  Gastrointestinal: Negative for abdominal distention, abdominal pain, diarrhea, nausea and vomiting.  Endocrine: Negative for polydipsia, polyphagia and polyuria.  Genitourinary: Negative for dysuria, frequency and hematuria.  Musculoskeletal: Negative for gait problem.  Skin: Negative for color change, pallor and rash.  Neurological: Positive for headaches. Negative for dizziness, syncope and light-headedness.  Hematological: Does not bruise/bleed easily.  Psychiatric/Behavioral: Negative for behavioral problems and confusion.     Physical Exam Updated Vital Signs BP (!) 166/83   Pulse (!) 57    Temp 98.3 F (36.8 C) (Oral)   Resp 16   LMP 02/10/2011   SpO2 96%   Physical Exam  Constitutional: She is oriented to person, place, and time. She appears well-developed and well-nourished. No distress.  HENT:  Head: Normocephalic.  Normal pharynx. TMs are normal. Conjunctiva not injected. Symmetric reactive pupils. Intact cranial nerves.  Eyes: Conjunctivae are normal. Pupils are equal, round, and reactive to light. No scleral icterus.  Neck: Normal range of motion. Neck supple. No thyromegaly present.  She states she felt some tenderness in her right lateral neck a few days ago. She has normal neck exam. She is not painful or limited with range of motion. She states she feels some discomfort to flex her neck with her chin to her chest with this is in the front. No frank Kernig's or Brudzinski's sign.  Cardiovascular: Normal rate and regular rhythm. Exam reveals no gallop and no friction rub.  No murmur heard. Pulmonary/Chest: Effort normal and breath sounds normal. No respiratory distress. She has no wheezes. She has no rales.  Abdominal: Soft. Bowel sounds are normal. She exhibits no distension. There is no tenderness. There is no rebound.  Musculoskeletal: Normal range of motion.  Neurological: She is alert and oriented to person, place, and time.  Intact symmetric cranial nerves. Laboratory is normal gait. No peripheral neurological symptoms or findings  Skin: Skin is warm and dry. No rash noted.  Psychiatric: She has a normal mood and affect. Her behavior is normal.     ED Treatments / Results  Labs (all labs ordered are listed, but only abnormal results are displayed) Labs Reviewed  CSF CELL COUNT WITH DIFFERENTIAL - Abnormal; Notable for the following components:      Result Value   RBC Count, CSF 47 (*)    WBC, CSF 31 (*)    Lymphs, CSF 95 (*)    Monocyte-Macrophage-Spinal Fluid 5 (*)    All other components within normal limits  CSF CELL COUNT WITH DIFFERENTIAL -  Abnormal; Notable for the following components:   RBC Count, CSF 4 (*)    WBC, CSF 26 (*)    Lymphs, CSF 96 (*)    Monocyte-Macrophage-Spinal Fluid 4 (*)    All other components within normal limits  PROTEIN, CSF - Abnormal; Notable for the following components:   Total  Protein, CSF 69 (*)    All other components within normal limits  COMPREHENSIVE METABOLIC PANEL - Abnormal; Notable for the following components:   Glucose, Bld 106 (*)    BUN 5 (*)    All other components within normal limits  CSF CULTURE  HSV CULTURE AND TYPING  GLUCOSE, CSF  SEDIMENTATION RATE  CBC WITH DIFFERENTIAL/PLATELET  C-REACTIVE PROTEIN  CRYPTOCOCCAL ANTIGEN, CSF  HERPES SIMPLEX VIRUS(HSV) DNA BY PCR  VDRL, CSF  OLIGOCLONAL BANDS, CSF + SERM    EKG  EKG Interpretation None       Radiology Mr Jeri Cos Wo Contrast  Result Date: 05/11/2017 CLINICAL DATA:  Persistent headaches.  No known injury. EXAM: MRI HEAD WITHOUT AND WITH CONTRAST TECHNIQUE: Multiplanar, multiecho pulse sequences of the brain and surrounding structures were obtained without and with intravenous contrast. CONTRAST:  29mL MULTIHANCE GADOBENATE DIMEGLUMINE 529 MG/ML IV SOLN COMPARISON:  CT head without contrast 09/06/2016 FINDINGS: Brain: Vascular enhancement is noted throughout prominent dilated perivascular spaces, particularly over the frontal lobe convexity bilaterally. There is no confluent enhancement. No discrete mass lesion is present. There is a T2 signal change are most notable within the right lentiform nucleus and thalamus bilaterally, left greater than right. T2 signal changes extend into the brainstem. Left greater than right periventricular T2 changes corresponds with the areas of enhancement. Internal auditory canals are within normal limits bilaterally. There is no significant dural enhancement. Vascular: Flow is present in the major intracranial arteries. Skull and upper cervical spine: The skullbase is within normal  limits. The craniocervical junction is normal. Sinuses/Orbits: Mild mucosal thickening is present in the maxillary sinuses, right greater than left. The globes and orbits are within normal limits. IMPRESSION: 1. Extensive enhancement within dilated perivascular spaces. 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 Recommend lumbar puncture and CSF analysis. 2. T2 signal changes in the basal ganglia and left greater than right frontal lobe white matter. These results were called by telephone at the time of interpretation on 05/11/2017 at 10:49 am to Dr. Sallee Lange , who verbally acknowledged these results. Electronically Signed   By: San Morelle M.D.   On: 05/11/2017 10:49   Dg Lumbar Puncture Fluoro Guide  Result Date: 05/11/2017 CLINICAL DATA:  Two week history of headache and vision changes. Palpation MRI today showed changes concerning for possible meningitis. Unsuccessful attempts at lumbar puncture in the ED tonight. EXAM: DIAGNOSTIC LUMBAR PUNCTURE UNDER FLUOROSCOPIC GUIDANCE FLUOROSCOPY TIME:  Fluoroscopy Time:  1 minutes 12 seconds Radiation Exposure Index (if provided by the fluoroscopic device): Dose area product is 229 micro Gy meter squared. Reference air car month 21.4 mGy. Number of Acquired Spot Images: 1 PROCEDURE: Informed consent was obtained from the patient prior to the procedure, including potential complications of headache, allergy, and pain. With the patient prone, the lower back was prepped with Betadine. 1% Lidocaine was used for local anesthesia. Lumbar puncture was performed at the L2-3 level using a 20 gauge needle with return of clear colorless CSF. 8 ml of CSF were obtained for laboratory studies. The patient tolerated the procedure well and there were no apparent complications. IMPRESSION: Lumbar puncture performed under fluoroscopic guidance without complication. 8 mL clear colorless CSF was obtained for laboratory evaluation. Electronically Signed   By:  Lucienne Capers M.D.   On: 05/11/2017 21:33    Procedures Procedures (including critical care time)  Medications Ordered in ED Medications  lidocaine (  PF) (XYLOCAINE) 1 % injection (not administered)  lidocaine (PF) (XYLOCAINE) 1 % injection (not administered)  fentaNYL (SUBLIMAZE) injection 50 mcg (50 mcg Intravenous Given 05/11/17 2204)  fentaNYL (SUBLIMAZE) injection 50 mcg (50 mcg Intravenous Given 05/11/17 1628)  ondansetron (ZOFRAN) injection 4 mg (4 mg Intravenous Given 05/11/17 1626)  fentaNYL (SUBLIMAZE) injection 50 mcg (50 mcg Intravenous Given 05/11/17 1742)     Initial Impression / Assessment and Plan / ED Course  I have reviewed the triage vital signs and the nursing notes.  Pertinent labs & imaging results that were available during my care of the patient were reviewed by me and considered in my medical decision making (see chart for details).   MRI obtained yesterday shows extensive enhancement with dilated perivascular spaces. The specific mention of possible cryptococcal meningitis. Patient would be low risk for this and not immune suppressed.  Plan lab for evaluation putting sedimentation rate CRP lumbar puncture with pressures Gram stain and viral studies. We'll further evaluate after collection of studies.    I was unsuccessful at LP. LP per Radiology completed. + WBCs, Neg GS. Appreciate Neurology input. Pt will be admitted.  Final Clinical Impressions(s) / ED Diagnoses   Final diagnoses:  Acute nonintractable headache, unspecified headache type  Abnormal MRI    ED Discharge Orders    None       Tanna Furry, MD 05/11/17 2328    Tanna Furry, MD 05/11/17 2355

## 2017-05-11 NOTE — Consult Note (Signed)
NEURO HOSPITALIST CONSULT NOTE   Requestig physician: Dr. Jeneen Rinks  Reason for Consult: Severe headaches and basal ganglia signal abnormalities on MRI  History obtained from:  Patient and Chart    HPI:                                                                                                                                          Amy Dean is an 52 y.o. female presenting with a 2.5 week history of severe throbbing headaches. The headaches are 10/10 at their worst, and 4-5/10 at their best. Analgesic medication provides only short term, partial relief. The headache is holocephalic, worse when lying down and with light or sound. No neck stiffness. Also has had a dry cough since her headache began; at times there is also mucus production with the cough. No history of uveitis, aphthous ulcers of mouth or genital ulceration. No fevers, but has had chills. She endorses waxing and waning severe low back pain exacerbated by movement, which has been present since the headache began 2.5 weeks ago. On Saturday, she had a spell of left hearing loss and right monocular visual blurring lasting for about 15 minutes, which spontaneously resolved. Since yesterday, she has had blurred vision in her left eye. One of her family members had pulmonary sarcoidosis and is now deceased. She has no history of autoimmune disorder. She has not traveled anywhere unusual and has not breathed in stagnant dust (such as in an attic, in a litter box or behind walls) recently, but does work at the hospital [employer]. Her partner has not been ill. She was recently prescribed Imitrex for her headaches, but it has not helped. Has had some paresthesias in the region of her left clavicle and shoulder.   MRI obtained in the ED shows bilateral basal ganglia and thalamus patchy T2 hyperintensities appearing subacute; there is corresponding enhancement in a vascular pattern.   CSF shows elevated protein  and WBC.   MRI brain with and without contrast:  1. Extensive enhancement within dilated perivascular spaces. 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 Recommend lumbar puncture and CSF analysis. 2. T2 signal changes in the basal ganglia and left greater than right frontal lobe white matter.  Past Medical History:  Diagnosis Date  . Anemia    Resolved post hysterectomy  . Enteritis   . Essential hypertension   . Hyperlipidemia   . Type 2 diabetes mellitus ([place])     Past Surgical History:  Procedure Laterality Date  . ABDOMINAL HYSTERECTOMY    . [place]   x2  . COLONOSCOPY N/A 04/22/2015   Procedure: COLONOSCOPY;  Surgeon: Rogene Houston, MD;  Location: [facility];  Service: Endoscopy;  Laterality: N/A;  1:00  .  EUS  10/26/2011   Procedure: UPPER ENDOSCOPIC ULTRASOUND (EUS) LINEAR;  Surgeon: Milus Banister, MD;  Location: [facility] END[facility];  Service: Endoscopy;  Laterality: N/A;  . HYSTEROSCOPY W/ ENDOMETRIAL ABLATION  07/22/2008   K.Harrington Challenger MD [facility]  . SUPRACERVICAL ABDOMINAL HYSTERECTOMY  03/21/2011   Procedure: HYSTERECTOMY SUPRACERVICAL ABDOMINAL;  Surgeon: Jonnie Kind, MD;  Location: [facility] ORS;  Service: Gynecology;;  . TONSILLECTOMY    . TUBAL LIGATION      Family History  Problem Relation Age of Onset  . Healthy Son   . Sarcoidosis Mother   . Heart attack Mother   . Stroke Mother   . Heart attack Paternal Uncle   . Hypertension Maternal Grandmother   . Anesthesia problems Neg Hx   . Hypotension Neg Hx   . Malignant hyperthermia Neg Hx   . Pseudochol deficiency Neg Hx    Social History:  reports that she has quit smoking. She smoked 1.50 packs per day. she has never used smokeless tobacco. She reports that she drinks alcohol. She reports that she does not use drugs.  No Known Allergies  MEDICATIONS:                                                                                                                         ROS:                                                                                                                                       As per HPI.   Blood pressure (!) 166/83, pulse (!) 57, temperature 98.3 F (36.8 C), temperature source Oral, resp. rate 16, last menstrual period 02/10/2011, SpO2 96 %.  General Examination:                                                                                                      HEENT-  [place]/AT. No neck stiffness. No Kernig or Brudzinski sign.  Lungs- Respirations unlabored Extremities- No edema.  Neurological Examination Mental Status: Alert, fully oriented, thought content appropriate. Memory intact to detailed recollection of her symptoms and recent events.  Speech fluent without evidence of aphasia.  Able to follow all commands without difficulty. Cranial Nerves: II: Visual fields intact except for possible mild blurring in a temporal crescentic distribution to left eye.   III,IV, VI: EOMI except for mildly decreased supraduction of right eye relative to left with upgaze. No nystagmus.  V,VII: No facial droop. Facial temp sensation normal bilaterally VIII: Hearing intact to conversation IX,X: No hypophonia or hoarseness XI: Symmetric shoulder shrug XII: midline tongue extension Motor: Right : Upper extremity   5/5    Left:     Upper extremity   5/5  Lower extremity   5/5     Lower extremity   5/5 Normal tone throughout; no atrophy noted Sensory: Temp and light touch intact throughout. No extinction.  Deep Tendon Reflexes:  2+ brachiradialis and biceps bilaterally. Positive Hoffman's sign bilaterally (subtle finding) Patellae trace reflexes in the context of guarding. Achilles 1+ bilaterally. Toes downgoing.  Cerebellar: No ataxia with FNF bilaterally.  Gait: Deferred  Lab Results: Basic Metabolic Panel: Recent Labs  Lab 05/11/17 0920 05/11/17 1615  NA  --  138  K  --  3.7  CL  --  104  CO2  --  23  GLUCOSE  --  106*   BUN  --  5*  CREATININE 0.70 0.70  CALCIUM  --  9.7    Liver Function Tests: Recent Labs  Lab 05/11/17 1615  AST 27  ALT 20  ALKPHOS 59  BILITOT 0.9  PROT 7.4  ALBUMIN 4.4   No results for input(s): LIPASE, AMYLASE in the last 168 hours. No results for input(s): AMMONIA in the last 168 hours.  CBC: Recent Labs  Lab 05/11/17 1615  WBC 4.3  NEUTROABS 1.9  HGB 14.3  HCT 40.9  MCV 88.3  PLT 193    Cardiac Enzymes: No results for input(s): CKTOTAL, CKMB, CKMBINDEX, TROPONINI in the last 168 hours.  Lipid Panel: No results for input(s): CHOL, TRIG, HDL, CHOLHDL, VLDL, LDLCALC in the last 168 hours.  CBG: No results for input(s): GLUCAP in the last 168 hours.  Microbiology: Results for orders placed or performed during the hospital encounter of 03/15/11  Surgical pcr screen     Status: None   Collection Time: 03/15/11  3:25 PM  Result Value Ref Range Status   MRSA, PCR NEGATIVE NEGATIVE Final   Staphylococcus aureus NEGATIVE NEGATIVE Final    Comment:        The Xpert SA Assay (FDA approved for NASAL specimens only), is one component of a comprehensive surveillance program.  It is not intended to diagnose infection nor to guide or monitor treatment.    Coagulation Studies: No results for input(s): LABPROT, INR in the last 72 hours.  Imaging: Mr Jeri Cos GG Contrast  Result Date: 05/11/2017 CLINICAL DATA:  Persistent headaches.  No known injury. EXAM: MRI HEAD WITHOUT AND WITH CONTRAST TECHNIQUE: Multiplanar, multiecho pulse sequences of the brain and surrounding structures were obtained without and with intravenous contrast. CONTRAST:  64m MULTIHANCE GADOBENATE DIMEGLUMINE 529 MG/ML IV SOLN COMPARISON:  CT head without contrast 09/06/2016 FINDINGS: Brain: Vascular enhancement is noted throughout prominent dilated perivascular spaces, particularly over the frontal lobe convexity bilaterally. There is no confluent enhancement. No discrete mass lesion is present.  There is a T2 signal change are most notable within the right lentiform nucleus and thalamus  bilaterally, left greater than right. T2 signal changes extend into the brainstem. Left greater than right periventricular T2 changes corresponds with the areas of enhancement. Internal auditory canals are within normal limits bilaterally. There is no significant dural enhancement. Vascular: Flow is present in the major intracranial arteries. Skull and upper cervical spine: The skullbase is within normal limits. The craniocervical junction is normal. Sinuses/Orbits: Mild mucosal thickening is present in the maxillary sinuses, right greater than left. The globes and orbits are within normal limits. IMPRESSION: 1. Extensive enhancement within dilated perivascular spaces. 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 Recommend lumbar puncture and CSF analysis. 2. T2 signal changes in the basal ganglia and left greater than right frontal lobe white matter. These results were called by telephone at the time of interpretation on 05/11/2017 at 10:49 am to Dr. Sallee Lange , who verbally acknowledged these results. Electronically Signed   By: San Morelle M.D.   On: 05/11/2017 10:49    Assessment: 52 year old female with a 2.5 week history of severe throbbing headaches, intermittent dry cough and severe waxing and waning low back pain.   1. MRI brain is clearly abnormal, with extensive enhancement within dilated perivascular spaces in conjunction with T2 signal changes in the basal ganglia and left greater than right frontal lobe white matter. 2. CSF clearly abnormal, with protein of 69, WBC 26 (predominantly lymphocytic) and low glucose of 51 which falls below the normal threshold of 0.6 x the serum glucose (serum glucose was 106).  DDx angiocentric lymphoma, neurosarcoidisis, Behcet's disease, VZV vasculitis, other vasculitis, unusual presentation of CNS lupus. The pattern on MRI is also compatible  with cryptococcus, but CSF cryptococcal antigen was negative.  3. LFTs normal. Renal function normal. CBC with normal white count. ESR 3.  4. CSF culture is pending.  5. There was no pressure reading with the fluoro-guided LP.  6. CSF VDRL and Lyme titer pending.   Recommendations: 1. Plain films of chest. If negative, will need to obtain CT chest with contrast to evaluate for possible pulmonary sarcoidosis, pulmonary fungal infection, lymphomatous infiltration or vasculitic changes. 2. MRI of cervical, thoracic and lumbar spine with and without contrast (ordered).  3. Will likely need repeat LP to measure CSF opening pressure, to repeat cryptococcal antigen. Also will need additional 10 cc CSF for flow cytometry and cytology (requires LP to be performed earlier in the day so that lab can process CSF immediately, as potential lymphomatous/malignant cells will degrade if CSF is left in refrigerator overnight prior to processing).  4. CSF ACE level (ordered) 5. Serum lupus panel (ordered).  6. CSF EBV PCR and VZV PCR (ordered)   Electronically signed: Dr. Kerney Elbe 05/11/2017, 9:29 PM

## 2017-05-11 NOTE — ED Notes (Addendum)
Pt had mri this am and was told she may have meningitis and she needs an LP.  Pt has had a migraine for 2 weeks.  No history of migraines.  Left eye is blurry and has swelling to neck areas on the sides

## 2017-05-11 NOTE — Telephone Encounter (Signed)
I spoke with pt and got her on the phone for Dr.Scott to speak with him.

## 2017-05-11 NOTE — ED Notes (Signed)
Edp aware that LP supplies are set up

## 2017-05-11 NOTE — Procedures (Signed)
LP performed with flouro guidance at the L2-3 interspace. 8 cc clear fluid obtained for labs. No complication.

## 2017-05-12 ENCOUNTER — Observation Stay (HOSPITAL_COMMUNITY): Payer: 59

## 2017-05-12 ENCOUNTER — Other Ambulatory Visit: Payer: Self-pay

## 2017-05-12 ENCOUNTER — Encounter (HOSPITAL_COMMUNITY): Payer: Self-pay | Admitting: Internal Medicine

## 2017-05-12 DIAGNOSIS — M47814 Spondylosis without myelopathy or radiculopathy, thoracic region: Secondary | ICD-10-CM | POA: Diagnosis not present

## 2017-05-12 DIAGNOSIS — K5900 Constipation, unspecified: Secondary | ICD-10-CM | POA: Diagnosis present

## 2017-05-12 DIAGNOSIS — A879 Viral meningitis, unspecified: Secondary | ICD-10-CM | POA: Diagnosis present

## 2017-05-12 DIAGNOSIS — M50222 Other cervical disc displacement at C5-C6 level: Secondary | ICD-10-CM | POA: Diagnosis not present

## 2017-05-12 DIAGNOSIS — A86 Unspecified viral encephalitis: Secondary | ICD-10-CM | POA: Diagnosis present

## 2017-05-12 DIAGNOSIS — E119 Type 2 diabetes mellitus without complications: Secondary | ICD-10-CM

## 2017-05-12 DIAGNOSIS — R51 Headache: Secondary | ICD-10-CM | POA: Diagnosis not present

## 2017-05-12 DIAGNOSIS — R05 Cough: Secondary | ICD-10-CM | POA: Diagnosis not present

## 2017-05-12 DIAGNOSIS — M5124 Other intervertebral disc displacement, thoracic region: Secondary | ICD-10-CM | POA: Diagnosis not present

## 2017-05-12 DIAGNOSIS — G894 Chronic pain syndrome: Secondary | ICD-10-CM | POA: Diagnosis present

## 2017-05-12 DIAGNOSIS — I677 Cerebral arteritis, not elsewhere classified: Secondary | ICD-10-CM | POA: Diagnosis not present

## 2017-05-12 DIAGNOSIS — R836 Abnormal cytological findings in cerebrospinal fluid: Secondary | ICD-10-CM | POA: Diagnosis not present

## 2017-05-12 DIAGNOSIS — Z87891 Personal history of nicotine dependence: Secondary | ICD-10-CM | POA: Diagnosis not present

## 2017-05-12 DIAGNOSIS — H9192 Unspecified hearing loss, left ear: Secondary | ICD-10-CM | POA: Diagnosis present

## 2017-05-12 DIAGNOSIS — Z7984 Long term (current) use of oral hypoglycemic drugs: Secondary | ICD-10-CM | POA: Diagnosis not present

## 2017-05-12 DIAGNOSIS — R0602 Shortness of breath: Secondary | ICD-10-CM | POA: Diagnosis not present

## 2017-05-12 DIAGNOSIS — I776 Arteritis, unspecified: Secondary | ICD-10-CM | POA: Diagnosis present

## 2017-05-12 DIAGNOSIS — G030 Nonpyogenic meningitis: Secondary | ICD-10-CM | POA: Diagnosis not present

## 2017-05-12 DIAGNOSIS — E785 Hyperlipidemia, unspecified: Secondary | ICD-10-CM | POA: Diagnosis present

## 2017-05-12 DIAGNOSIS — R9389 Abnormal findings on diagnostic imaging of other specified body structures: Secondary | ICD-10-CM | POA: Diagnosis not present

## 2017-05-12 DIAGNOSIS — I10 Essential (primary) hypertension: Secondary | ICD-10-CM

## 2017-05-12 DIAGNOSIS — Z79899 Other long term (current) drug therapy: Secondary | ICD-10-CM | POA: Diagnosis not present

## 2017-05-12 DIAGNOSIS — R590 Localized enlarged lymph nodes: Secondary | ICD-10-CM | POA: Diagnosis not present

## 2017-05-12 DIAGNOSIS — R402413 Glasgow coma scale score 13-15, at hospital admission: Secondary | ICD-10-CM | POA: Diagnosis present

## 2017-05-12 DIAGNOSIS — M5126 Other intervertebral disc displacement, lumbar region: Secondary | ICD-10-CM | POA: Diagnosis not present

## 2017-05-12 DIAGNOSIS — M47816 Spondylosis without myelopathy or radiculopathy, lumbar region: Secondary | ICD-10-CM | POA: Diagnosis not present

## 2017-05-12 DIAGNOSIS — H538 Other visual disturbances: Secondary | ICD-10-CM | POA: Diagnosis present

## 2017-05-12 DIAGNOSIS — R918 Other nonspecific abnormal finding of lung field: Secondary | ICD-10-CM | POA: Diagnosis present

## 2017-05-12 LAB — COMPREHENSIVE METABOLIC PANEL
ALT: 19 U/L (ref 14–54)
AST: 22 U/L (ref 15–41)
Albumin: 3.6 g/dL (ref 3.5–5.0)
Alkaline Phosphatase: 52 U/L (ref 38–126)
Anion gap: 10 (ref 5–15)
BUN: 8 mg/dL (ref 6–20)
CO2: 25 mmol/L (ref 22–32)
Calcium: 9.2 mg/dL (ref 8.9–10.3)
Chloride: 101 mmol/L (ref 101–111)
Creatinine, Ser: 0.79 mg/dL (ref 0.44–1.00)
GFR calc Af Amer: >60 mL/min (ref >60)
GFR calc non Af Amer: >60 mL/min (ref >60)
Glucose, Bld: 154 mg/dL — ABNORMAL HIGH (ref 65–99)
Potassium: 3.5 mmol/L (ref 3.5–5.1)
Sodium: 136 mmol/L (ref 135–145)
Total Bilirubin: 0.9 mg/dL (ref 0.3–1.2)
Total Protein: 6.8 g/dL (ref 6.5–8.1)

## 2017-05-12 LAB — CBC WITH DIFFERENTIAL/PLATELET
Basophils Absolute: 0 10*3/uL (ref 0.0–0.1)
Basophils Relative: 1 %
Eosinophils Absolute: 0 10*3/uL (ref 0.0–0.7)
Eosinophils Relative: 1 %
HCT: 37.3 % (ref 36.0–46.0)
Hemoglobin: 12.6 g/dL (ref 12.0–15.0)
Lymphocytes Relative: 32 %
Lymphs Abs: 1.1 10*3/uL (ref 0.7–4.0)
MCH: 30 pg (ref 26.0–34.0)
MCHC: 33.8 g/dL (ref 30.0–36.0)
MCV: 88.8 fL (ref 78.0–100.0)
Monocytes Absolute: 0.3 10*3/uL (ref 0.1–1.0)
Monocytes Relative: 8 %
Neutro Abs: 2 10*3/uL (ref 1.7–7.7)
Neutrophils Relative %: 58 %
Platelets: 173 10*3/uL (ref 150–400)
RBC: 4.2 MIL/uL (ref 3.87–5.11)
RDW: 12.6 % (ref 11.5–15.5)
WBC: 3.5 10*3/uL — ABNORMAL LOW (ref 4.0–10.5)

## 2017-05-12 LAB — EXTRACTABLE NUCLEAR ANTIGEN ANTIBODY
ENA SM Ab Ser-aCnc: <0.2 AI (ref 0.0–0.9)
Ribonucleic Protein: <0.2 AI (ref 0.0–0.9)
SSA (Ro) (ENA) Antibody, IgG: <0.2 AI (ref 0.0–0.9)
SSB (La) (ENA) Antibody, IgG: <0.2 AI (ref 0.0–0.9)
Scleroderma (Scl-70) (ENA) Antibody, IgG: 0.3 AI (ref 0.0–0.9)
ds DNA Ab: <1 IU/mL (ref 0–9)

## 2017-05-12 LAB — GLUCOSE, CAPILLARY
Glucose-Capillary: 99 mg/dL (ref 65–99)
Glucose-Capillary: 97 mg/dL (ref 65–99)
Glucose-Capillary: 174 mg/dL — ABNORMAL HIGH (ref 65–99)
Glucose-Capillary: 125 mg/dL — ABNORMAL HIGH (ref 65–99)

## 2017-05-12 LAB — ANTI-JO 1 ANTIBODY, IGG: Anti JO-1: <0.2 AI (ref 0.0–0.9)

## 2017-05-12 LAB — HIV ANTIBODY (ROUTINE TESTING W REFLEX): HIV Screen 4th Generation wRfx: NONREACTIVE

## 2017-05-12 LAB — SEDIMENTATION RATE: Sed Rate: 8 mm/hr (ref 0–22)

## 2017-05-12 MED ORDER — HYDRALAZINE HCL 20 MG/ML IJ SOLN: 10.0000 mg | INTRAMUSCULAR | Status: DC | PRN

## 2017-05-12 MED ORDER — IOPAMIDOL (ISOVUE-300) INJECTION 61%
INTRAVENOUS | Status: AC
  Administered 2017-05-12: 75 mL via INTRAVENOUS
  Filled 2017-05-12: qty 75

## 2017-05-12 MED ORDER — CITALOPRAM HYDROBROMIDE 10 MG PO TABS
20.0000 mg | ORAL_TABLET | Freq: Every day | ORAL | Status: DC
  Administered 2017-05-12 – 2017-05-16 (×5): 20 mg via ORAL
  Filled 2017-05-12 (×6): qty 2

## 2017-05-12 MED ORDER — LORATADINE 10 MG PO TABS
10.0000 mg | ORAL_TABLET | Freq: Every day | ORAL | Status: DC
  Administered 2017-05-13 – 2017-05-16 (×4): 10 mg via ORAL
  Filled 2017-05-12 (×5): qty 1

## 2017-05-12 MED ORDER — HYDROCODONE-ACETAMINOPHEN 5-325 MG PO TABS
1.0000 | ORAL_TABLET | Freq: Four times a day (QID) | ORAL | Status: DC
  Administered 2017-05-12 – 2017-05-13 (×4): 1 via ORAL
  Filled 2017-05-12 (×4): qty 1

## 2017-05-12 MED ORDER — LISINOPRIL 20 MG PO TABS
20.0000 mg | ORAL_TABLET | Freq: Every day | ORAL | Status: DC
  Administered 2017-05-12 – 2017-05-16 (×5): 20 mg via ORAL
  Filled 2017-05-12 (×5): qty 1

## 2017-05-12 MED ORDER — OMEGA-3-ACID ETHYL ESTERS 1 G PO CAPS
1.0000 | ORAL_CAPSULE | Freq: Every day | ORAL | Status: DC
  Administered 2017-05-12 – 2017-05-16 (×5): 1 g via ORAL
  Filled 2017-05-12 (×5): qty 1

## 2017-05-12 MED ORDER — SODIUM CHLORIDE 0.9 % IV SOLN
INTRAVENOUS | Status: AC
  Administered 2017-05-12: 03:00:00 via INTRAVENOUS

## 2017-05-12 MED ORDER — PRAVASTATIN SODIUM 40 MG PO TABS
40.0000 mg | ORAL_TABLET | Freq: Every day | ORAL | Status: DC
  Administered 2017-05-12 – 2017-05-16 (×5): 40 mg via ORAL
  Filled 2017-05-12 (×5): qty 1

## 2017-05-12 MED ORDER — AMLODIPINE BESYLATE 10 MG PO TABS
10.0000 mg | ORAL_TABLET | Freq: Every day | ORAL | Status: DC
  Administered 2017-05-12 – 2017-05-16 (×5): 10 mg via ORAL
  Filled 2017-05-12 (×5): qty 1

## 2017-05-12 MED ORDER — ALPRAZOLAM 0.5 MG PO TABS: 0.5000 mg | ORAL_TABLET | Freq: Every evening | ORAL | Status: DC | PRN

## 2017-05-12 MED ORDER — HYDROCODONE-ACETAMINOPHEN 5-325 MG PO TABS
1.0000 | ORAL_TABLET | ORAL | Status: DC | PRN
  Administered 2017-05-12: 1 via ORAL
  Filled 2017-05-12: qty 1

## 2017-05-12 MED ORDER — LIRAGLUTIDE 18 MG/3ML ~~LOC~~ SOPN
1.2000 mg | PEN_INJECTOR | Freq: Every day | SUBCUTANEOUS | Status: DC
  Administered 2017-05-12: 1.2 mg via SUBCUTANEOUS
  Filled 2017-05-12: qty 3

## 2017-05-12 MED ORDER — INSULIN ASPART 100 UNIT/ML ~~LOC~~ SOLN
0.0000 [IU] | Freq: Three times a day (TID) | SUBCUTANEOUS | Status: DC
  Administered 2017-05-12 – 2017-05-13 (×3): 2 [IU] via SUBCUTANEOUS

## 2017-05-12 MED ORDER — PNEUMOCOCCAL VAC POLYVALENT 25 MCG/0.5ML IJ INJ: 0.5000 mL | INJECTION | INTRAMUSCULAR | Status: DC

## 2017-05-12 MED ORDER — ACETAMINOPHEN 325 MG PO TABS
650.0000 mg | ORAL_TABLET | Freq: Four times a day (QID) | ORAL | Status: DC | PRN
  Administered 2017-05-12 – 2017-05-16 (×8): 650 mg via ORAL
  Filled 2017-05-12 (×8): qty 2

## 2017-05-12 MED ORDER — ACETAMINOPHEN 650 MG RE SUPP: 650.0000 mg | Freq: Four times a day (QID) | RECTAL | Status: DC | PRN

## 2017-05-12 MED ORDER — ONDANSETRON HCL 4 MG PO TABS: 4.0000 mg | ORAL_TABLET | Freq: Four times a day (QID) | ORAL | Status: DC | PRN

## 2017-05-12 MED ORDER — ONDANSETRON HCL 4 MG/2ML IJ SOLN: 4.0000 mg | Freq: Four times a day (QID) | INTRAMUSCULAR | Status: DC | PRN

## 2017-05-12 MED ORDER — ALPRAZOLAM 0.5 MG PO TABS
0.5000 mg | ORAL_TABLET | Freq: Two times a day (BID) | ORAL | Status: DC
  Administered 2017-05-12 (×2): 0.5 mg via ORAL
  Filled 2017-05-12 (×2): qty 1

## 2017-05-12 NOTE — Progress Notes (Signed)
Pt left floor for imaging @1407 

## 2017-05-12 NOTE — Progress Notes (Signed)
NEUROHOSPITALISTS - DAILY PROGRESS NOTE   SUBJECTIVE: No family is at the bedside. Patient is found laying in bed in NAD. Overall she feels her condition is unchanged. Voices no new complaints. No new/acute events reported overnight.  Patient denies any recent illness/travel prior to hospitalization Patient denies any new supplements or medications Patient denies any history of migraine headaches Admits to occasional sinus headaches Admits to headaches if her B/P is not well controlled  Describes headache band around her head 1 week after headaches started had 15-20-minute episode of right eye blurred vision and left ear hearing loss 1 week after headaches started had 2-3-day episode for appetite, nausea and diarrhea.  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  No recent weight loss or gain 1 year history of quarter size black and blue spot on left side abdomen Suddenly appeared, raised, itchy, sore- has been using 2 creams, no change  Summer 2017 patient went to ENT for enlarged lymph node/cyst behind right ear Patient believes biopsy was sent to lab, no records in electronic record found    OBJECTIVE Exam: Temp:  [98.3 F (36.8 C)] 98.3 F (36.8 C) (01/04 1444) Pulse Rate:  [55-86] 86 (01/05 0948) Resp:  [16-18] 18 (01/05 0948) BP: (147-181)/(64-88) 155/80 (01/05 0948) SpO2:  [93 %-97 %] 93 % (01/05 0948) Weight:  [90.7 kg (200 lb)] 90.7 kg (200 lb) (01/05 0247) General - Well  nourished, well developed, in no apparent distress HEENT-  Normocephalic, Normal external eye/conjunctiva.  Normal external ears. Normal external nose, mucus membranes and septum.   + enlarged lymph node behind Right ear Cardiovascular - Regular rate and rhythm  Respiratory - Lungs clear bilaterally. No wheezing. Abdomen - soft and non-tender, BS normal Extremities- no edema or cyanosis Skin- quarter sized area, left lower abdomen, blue tinged, slightly raised, sore to the touch Neurological Examination Mental Status: Alert, fully oriented, thought content appropriate. Memory intact to detailed recollection of her symptoms and recent events.  Speech fluent without evidence of aphasia.  Able to follow all commands without difficulty. Cranial Nerves: II: Visual fields intact except for possible mild blurring in a temporal crescentic distribution to left eye.   III,IV, VI: EOMI except for mildly decreased supraduction of right eye relative to left with upgaze. No nystagmus.  V,VII: No facial droop. Facial temp sensation normal bilaterally VIII: Hearing intact to conversation IX,X: No hypophonia or hoarseness XI: Symmetric shoulder shrug XII: midline tongue extension Motor: Right :  Upper extremity   5/5                                      Left:     Upper extremity   5/5             Lower extremity   5/5  Lower extremity   5/5 Normal tone throughout; no atrophy noted Sensory: Temp and light touch intact throughout. No extinction.  Deep Tendon Reflexes:  2+ brachiradialis and biceps bilaterally. Positive Hoffman's sign bilaterally (subtle finding) Patellae trace reflexes in the context of guarding. Achilles 1+ bilaterally. Toes downgoing.  Cerebellar: No ataxia with FNF bilaterally.  Gait: No truncal ataxia  Pertinent Labs/Diagnostics: CBC:  Recent Labs  Lab 05/11/17 1615 05/12/17 0354  WBC 4.3 3.5*  HGB 14.3 12.6  HCT 40.9 37.3  MCV  88.3 88.8  PLT 193 173   BMP: Recent Labs  Lab 05/11/17 0920 05/11/17 1615 05/12/17 0354  NA  --  138 136  K  --  3.7 3.5  CL  --  104 101  CO2  --  23 25  GLUCOSE  --  106* 154*  BUN  --  5* 8  CREATININE 0.70 0.70 0.79  CALCIUM  --  9.7 9.2   Recent Results (from the past 240 hour(s))  CSF culture     Status: None (Preliminary result)   Collection Time: 05/11/17  8:59 PM  Result Value Ref Range Status   Specimen Description CSF  Final   Special Requests NONE  Final   Gram Stain   Final    CYTOSPIN SMEAR WBC PRESENT, PREDOMINANTLY PMN NO ORGANISMS SEEN    Culture NO GROWTH 1 DAY  Final   Report Status PENDING  Incomplete   IMAGING: I have personally reviewed the radiological images below and agree with the radiology interpretations. Dg Chest 2 View Result Date: 05/12/2017 IMPRESSION: No acute pulmonary process.   Mr Jeri Cos Wo Contrast Result Date: 05/11/2017 IMPRESSION: 1. Extensive enhancement within dilated perivascular spaces. 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 Recommend lumbar puncture and CSF analysis. 2. T2 signal changes in the basal ganglia and left greater than right frontal lobe white matter.    IMPRESSION: 52 year old female with a 2.5 week history of severe throbbing headaches, intermittent dry cough and severe waxing and waning low back pain  DDx: possible meningitis, angiocentric lymphoma, neurosarcoidisis, Behcet's disease, VZV vasculitis, other vasculitis, unusual presentation of CNS lupus,  possible pulmonary sarcoidosis, pulmonary fungal infection, lymphomatous infiltration or vasculitic changes  05/12/2017:  ASSESSMENT: Neuro exam remains stable.  Headache quality unchanged. Denies back pain on exam.  Enlarged lymph node noted behind right ear.  Quarter sized area, left lower abdomen bluish tinged noted.  States this spot has been there for over one year. Patient was able to give further detailed background history.  We will  continue to monitor closely.  Outstanding Work-up Studies:     CT chest  MRI cervical thoracic lumbar spine Multiple labs and CSF studies  PLAN  05/12/2017: Closes monitor imaging and lab results Will likely repeat LP after labs and imaging reviewed Continue to treat H/A symptoms PRN  Hospital day # 0 FAMILY UPDATES: No family at bedside  TEAM UPDATES: Mendel Corning, MD  Assessment & plan discussed with with attending physician and they are in agreement.    Mary A Costello. ANP-C [facility] 05/12/2017, 12:32 PM  NEUROHOSPITALIST ADDENDUM Seen and examined the patient today. Formulated plan as documented above by PAC/Resident. I agree with recommendations as above. Will follow.  Karena Addison Aroor MD [facility] [phone]  If 7pm to 7am, please call on call as listed on AMION.

## 2017-05-12 NOTE — Progress Notes (Addendum)
[facility]                                                                              Patient Demographics  Amy Dean, is a 52 y.o. female, DOB - September 04, 1965, [id number]  Admit date - 05/11/2017   Admitting Physician Rise Patience, MD  Outpatient Primary MD for the patient is Kathyrn Drown, MD  Outpatient specialists:   LOS - 0  days   Medical records reviewed and are as summarized below:    Chief Complaint  Patient presents with  . Migraine       Brief summary    Amy Dean is a 52 y.o. 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  Patient had been having headaches for the last 2-1/2 weeks which is globally with throbbing type at times 10 out of 10.  Last few days patient also has been having some left blurred vision.  Around 6 days ago patient also had transient difficulty getting the left ears which resolved.  Denied any weakness of upper or lower extremity.  Despite taking pain relief medications patient headache was still persistent. 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 -Neurology was consulted, underwent LP.  CSF showed protein 69, WBCs 26 predominantly lymphocytic and low glucose 51. -Cryptococcal antigen negative, LFTs normal, CSF cultures negative so far.  No pressure reading was done.  ESR 3 -Workup in progress to rule out neurosarcoidosis, Behcet's disease, vasculitis. -Chest x-ray negative, ordered CT chest with contrast to rule out  sarcoidosis, fungal infection or  lymphomatous infiltration/vasculitis -Follow MRI of the cervical, thoracic and lumbar spine.  Discussed with Dr. Cheral Marker, patient will need repeat LP under fluoroscopy for opening pressure, flow cytometry and cytology.   Active Problems:   Essential hypertension -Continue amlodipine, lisinopril    Diabetes mellitus type II, controlled (HCC) -Continue sliding scale insulin CBGs controlled  Chronic pain syndrome -Discussed in detail with patient's son and daughter-in-law, patient has been taking scheduled Percocet and Xanax for years (family has control over her medications), they are afraid of patient going into withdrawals with as needed medications.  She has pain medication physician. -Change Percocet to 1 tab q6hours, will change if patient is sleep.  Placed on Xanax 0.5 mg twice daily to avoid withdrawals.    Code Status: Full CODE STATUS DVT Prophylaxis: SCD's Family Communication: Discussed in detail with the patient, all imaging results, lab results explained to the patient, son and daughter-in-law at bedside   Disposition Plan:   Time Spent in minutes   25 minutes  Procedures:  spinal tap  Consultants:   neuro  Antimicrobials:      Medications  Scheduled Meds: . ALPRAZolam  0.5 mg Oral BID  .  amLODipine  10 mg Oral Daily  . citalopram  20 mg Oral Daily  . HYDROcodone-acetaminophen  1 tablet Oral Q6H  . insulin aspart  0-9 Units Subcutaneous TID WC  . liraglutide  1.2 mg Subcutaneous Daily  . lisinopril  20 mg Oral Daily  . loratadine  10 mg Oral Daily  . omega-3 acid ethyl esters  1 capsule Oral Daily  . [START ON 05/13/2017] pneumococcal 23 valent vaccine  0.5 mL Intramuscular Tomorrow-1000  . pravastatin  40 mg Oral Daily   Continuous Infusions: . sodium chloride 100 mL/hr at 05/12/17 0300   PRN Meds:.acetaminophen **OR** acetaminophen, hydrALAZINE, ondansetron **OR** ondansetron (ZOFRAN) IV   Antibiotics    Anti-infectives (From admission, onward)   None        Subjective:   Shelise Maron was seen and examined today.  Still complaining of headache 8/10, throbbing, has chronic vision issues.  No focal weakness, numbness or tingling.  Patient denies dizziness, chest pain, shortness of breath, abdominal pain, N/V/D/C.  No acute events overnight.    Objective:   Vitals:   05/12/17 0200 05/12/17 0247 05/12/17 0523 05/12/17 0948  BP: (!) 152/70 (!) 147/73 (!) 149/66 (!) 155/80  Pulse: 64 69 63 86  Resp:  _0 Temp:      TempSrc:      SpO2: 94% 95% 96% 93%  Weight:  90.7 kg (200 lb)    Height:  _1  (1.626 m)      Intake/Output Summary (Last 24 hours) at 05/12/2017 1114 Last data filed at 05/12/2017 0400 Gross per 24 hour  Intake 540 ml  Output -  Net 540 ml     Wt Readings from Last 3 Encounters:  05/12/17 90.7 kg (200 lb)  07/17/14 85.7 kg (189 lb)  04/01/14 85.3 kg (188 lb)     Exam  General: Alert and oriented x 3, NAD  Eyes:   HEENT:  Atraumatic, normocephalic  Cardiovascular: S1 S2 auscultated, no rubs, murmurs or gallops. Regular rate and rhythm.  Respiratory: Clear to auscultation bilaterally, no wheezing, rales or rhonchi  Gastrointestinal: Soft, nontender, nondistended, + bowel sounds  Ext: no pedal edema bilaterally  Neuro: AAOx3,  Strength 5/5 upper and lower extremities bilaterally, speech clear,   Musculoskeletal: No digital cyanosis, clubbing  Skin: No rashes  Psych: Normal affect and demeanor, alert and oriented x3    Data Reviewed:  I have personally reviewed following labs and imaging studies  Micro Results Recent Results (from the past 240 hour(s))  CSF culture     Status: None (Preliminary result)   Collection Time: 05/11/17  8:59 PM  Result Value Ref Range Status   Specimen Description CSF  Final   Special Requests NONE  Final   Gram Stain   Final    CYTOSPIN SMEAR WBC PRESENT, PREDOMINANTLY PMN NO ORGANISMS SEEN     Culture NO GROWTH 1 DAY  Final   Report Status PENDING  Incomplete    Radiology Reports Dg Chest 2 View  Result Date: 05/12/2017 CLINICAL DATA:  Headache today. EXAM: CHEST  2 VIEW COMPARISON:  Radiograph 09/17/2008 FINDINGS: The cardiomediastinal contours are normal. The lungs are clear. Pulmonary vasculature is normal. No consolidation, pleural effusion, or pneumothorax. No acute osseous abnormalities are seen. IMPRESSION: No acute pulmonary process. Electronically Signed   By: Jeb Levering M.D.   On: 05/12/2017 06:45   Mr Jeri Cos FO Contrast  Result Date: 05/11/2017 CLINICAL DATA:  Persistent headaches.  No known injury.  EXAM: MRI HEAD WITHOUT AND WITH CONTRAST TECHNIQUE: Multiplanar, multiecho pulse sequences of the brain and surrounding structures were obtained without and with intravenous contrast. CONTRAST:  33m MULTIHANCE GADOBENATE DIMEGLUMINE 529 MG/ML IV SOLN COMPARISON:  CT head without contrast 09/06/2016 FINDINGS: Brain: Vascular enhancement is noted throughout prominent dilated perivascular spaces, particularly over the frontal lobe convexity bilaterally. There is no confluent enhancement. No discrete mass lesion is present. There is a T2 signal change are most notable within the right lentiform nucleus and thalamus bilaterally, left greater than right. T2 signal changes extend into the brainstem. Left greater than right periventricular T2 changes corresponds with the areas of enhancement. Internal auditory canals are within normal limits bilaterally. There is no significant dural enhancement. Vascular: Flow is present in the major intracranial arteries. Skull and upper cervical spine: The skullbase is within normal limits. The craniocervical junction is normal. Sinuses/Orbits: Mild mucosal thickening is present in the maxillary sinuses, right greater than left. The globes and orbits are within normal limits. IMPRESSION: 1. Extensive enhancement within dilated perivascular spaces.  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 Recommend lumbar puncture and CSF analysis. 2. T2 signal changes in the basal ganglia and left greater than right frontal lobe white matter. These results were called by telephone at the time of interpretation on 05/11/2017 at 10:49 am to Dr. SSallee Lange, who verbally acknowledged these results. Electronically Signed   By: CSan MorelleM.D.   On: 05/11/2017 10:49   Dg Lumbar Puncture Fluoro Guide  Result Date: 05/11/2017 CLINICAL DATA:  Two week history of headache and vision changes. Palpation MRI today showed changes concerning for possible meningitis. Unsuccessful attempts at lumbar puncture in the ED tonight. EXAM: DIAGNOSTIC LUMBAR PUNCTURE UNDER FLUOROSCOPIC GUIDANCE FLUOROSCOPY TIME:  Fluoroscopy Time:  1 minutes 12 seconds Radiation Exposure Index (if provided by the fluoroscopic device): Dose area product is 229 micro Gy meter squared. Reference air car month 21.4 mGy. Number of Acquired Spot Images: 1 PROCEDURE: Informed consent was obtained from the patient prior to the procedure, including potential complications of headache, allergy, and pain. With the patient prone, the lower back was prepped with Betadine. 1% Lidocaine was used for local anesthesia. Lumbar puncture was performed at the L2-3 level using a 20 gauge needle with return of clear colorless CSF. 8 ml of CSF were obtained for laboratory studies. The patient tolerated the procedure well and there were no apparent complications. IMPRESSION: Lumbar puncture performed under fluoroscopic guidance without complication. 8 mL clear colorless CSF was obtained for laboratory evaluation. Electronically Signed   By: WLucienne CapersM.D.   On: 05/11/2017 21:33    Lab Data:  CBC: Recent Labs  Lab 05/11/17 1615 05/12/17 0354  WBC 4.3 3.5*  NEUTROABS 1.9 2.0  HGB 14.3 12.6  HCT 40.9 37.3  MCV 88.3 88.8  PLT 193 1563  Basic Metabolic Panel: Recent Labs  Lab  05/11/17 0920 05/11/17 1615 05/12/17 0354  NA  --  138 136  K  --  3.7 3.5  CL  --  104 101  CO2  --  23 25  GLUCOSE  --  106* 154*  BUN  --  5* 8  CREATININE 0.70 0.70 0.79  CALCIUM  --  9.7 9.2   GFR: Estimated Creatinine Clearance: 89.7 mL/min (by C-G formula based on SCr of 0.79 mg/dL). Liver Function Tests: Recent Labs  Lab 05/11/17 1615 05/12/17 0354  AST 27 22  ALT 20 19  ALKPHOS 59 52  BILITOT 0.9 0.9  PROT 7.4 6.8  ALBUMIN 4.4 3.6   No results for input(s): LIPASE, AMYLASE in the last 168 hours. No results for input(s): AMMONIA in the last 168 hours. Coagulation Profile: No results for input(s): INR, PROTIME in the last 168 hours. Cardiac Enzymes: No results for input(s): CKTOTAL, CKMB, CKMBINDEX, TROPONINI in the last 168 hours. BNP (last 3 results) No results for input(s): PROBNP in the last 8760 hours. HbA1C: No results for input(s): HGBA1C in the last 72 hours. CBG: Recent Labs  Lab 05/12/17 0610  GLUCAP 99   Lipid Profile: No results for input(s): CHOL, HDL, LDLCALC, TRIG, CHOLHDL, LDLDIRECT in the last 72 hours. Thyroid Function Tests: No results for input(s): TSH, T4TOTAL, FREET4, T3FREE, THYROIDAB in the last 72 hours. Anemia Panel: No results for input(s): VITAMINB12, FOLATE, FERRITIN, TIBC, IRON, RETICCTPCT in the last 72 hours. Urine analysis:    Component Value Date/Time   COLORURINE YELLOW 09/06/2016 0756   APPEARANCEUR CLEAR 09/06/2016 0756   LABSPEC 1.015 09/06/2016 0756   PHURINE 7.0 09/06/2016 0756   GLUCOSEU NEGATIVE 09/06/2016 0756   HGBUR NEGATIVE 09/06/2016 0756   BILIRUBINUR NEGATIVE 09/06/2016 0756   BILIRUBINUR ++ 01/27/2015 1559   KETONESUR NEGATIVE 09/06/2016 0756   PROTEINUR NEGATIVE 09/06/2016 0756   UROBILINOGEN 1.0 07/17/2014 1435   NITRITE NEGATIVE 09/06/2016 0756   LEUKOCYTESUR NEGATIVE 09/06/2016 0756     Ripudeep Rai M.D. [facility] 05/12/2017, 11:14 AM  Pager: [phone] Between 7am to 7pm - call  Pager - [phone]  After 7pm go to [email or web address] - password [email or web address]  Call night coverage person covering after 7pm  Addendum: Please note patient does have chronic pain syndrome, however the conversation about pain medications was not with this patient. Accidentally put in Ms Carothers chart.  She takes Percocet as needed and Xanax at bedtime.   Estill Cotta M.D. [facility] 05/13/2017, 12:52 PM  Pager: [phone]

## 2017-05-12 NOTE — ED Notes (Signed)
Pt made aware of bed assignment 

## 2017-05-12 NOTE — H&P (Signed)
History and Physical    Amy Dean QMG:[id number] DOB: 1965-12-25 DOA: 05/11/2017  PCP: Kathyrn Drown, MD  Patient coming from: Home.  Chief Complaint: Headache.  HPI: Amy Dean is a 52 y.o. 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  Patient has been having headaches for the last 2-1/2 weeks which is globally with throbbing type at times 10 out of 10.  Last few days patient also has been having some left blurred vision.  Around 6 days ago patient also had transient difficulty getting the left ears.  Which resolved.  Denies any weakness of upper or lower extremity.  Despite taking pain relief medications patient headache was still persistent.  Patient's primary care physician ordered MRI brain which was showing features concerning for meningitis specifically cryptococcal patient was referred to the ER.  Patient otherwise denies any fever chills chest pain shortness of breath nausea vomiting diarrhea loss of consciousness or neck pain.  Denies any recent travel or sick contacts.  Denies any new medications.  ED Course: In the ER patient had lumbar puncture done under fluoroscopic guidance which showed WBC of 31 lymphocytes were 95%.  RBC 47 protein 69.  Neurologist on call Dr. Cheral Marker was consulted.  Patient admitted for further management.  Cryptococcal antigen was negative.  Review of Systems: As per HPI, rest all negative.   Past Medical History:  Diagnosis Date  . Anemia    Resolved post hysterectomy  . Enteritis   . Essential hypertension   . Hyperlipidemia   . Type 2 diabetes mellitus ([place])     Past Surgical History:  Procedure Laterality Date  . ABDOMINAL HYSTERECTOMY    . [place]   x2  . COLONOSCOPY N/A 04/22/2015   Procedure: COLONOSCOPY;  Surgeon: Rogene Houston, MD;  Location: [facility];  Service: Endoscopy;  Laterality: N/A;  1:00  . EUS  10/26/2011   Procedure: UPPER ENDOSCOPIC ULTRASOUND (EUS) LINEAR;  Surgeon: Milus Banister, MD;  Location: [facility];  Service: Endoscopy;  Laterality: N/A;  . HYSTEROSCOPY W/ ENDOMETRIAL ABLATION  07/22/2008   K.Harrington Challenger MD [facility]  . SUPRACERVICAL ABDOMINAL HYSTERECTOMY  03/21/2011   Procedure: HYSTERECTOMY SUPRACERVICAL ABDOMINAL;  Surgeon: Jonnie Kind, MD;  Location: [facility] ORS;  Service: Gynecology;;  . TONSILLECTOMY    . TUBAL LIGATION       reports that she has quit smoking. She smoked 1.50 packs per day. she has never used smokeless tobacco. She reports that she drinks alcohol. She reports that she does not use drugs.  No Known Allergies  Family History  Problem Relation Age of Onset  . Healthy Son   . Sarcoidosis Mother   . Heart attack Mother   . Stroke Mother   . Heart attack Paternal Uncle   . Hypertension Maternal Grandmother   . Anesthesia problems Neg Hx   . Hypotension Neg Hx   . Malignant hyperthermia Neg Hx   . Pseudochol deficiency Neg Hx     Prior to Admission medications   Medication Sig Start Date End Date Taking? Authorizing Provider  acetaminophen (TYLENOL) 500 MG tablet Take 500 mg by mouth every 8 (eight) hours as needed for mild pain. Reported on 07/12/2015   Yes [provider]  ALPRAZolam (XANAX) 0.5 MG tablet TAKE 1 TABLET BY MOUTH AT BEDTIME AS NEEDED FOR SLEEP 03/12/17  Yes  Nilda Simmer, NP  amLODipine (NORVASC) 10 MG tablet Take 1 tablet (10 mg total) by mouth daily. 05/10/17  Yes Kathyrn Drown, MD  cetirizine (ZYRTEC) 10 MG tablet Take 1 tablet (10 mg total) by mouth daily. Patient taking differently: Take 10 mg by mouth daily as needed for allergies.  09/08/16  Yes Kathyrn Drown, MD  cholecalciferol (VITAMIN D) 1000 units tablet Take 1,000 Units by mouth daily.   Yes [provider]  citalopram (CELEXA) 20 MG tablet Take 1.5 tablets (30 mg total) by mouth daily. Patient  taking differently: Take 20 mg by mouth daily.  09/08/16  Yes Kathyrn Drown, MD  fexofenadine (ALLEGRA) 180 MG tablet Take 1 tablet (180 mg total) by mouth daily. 09/08/16  Yes Luking, Elayne Snare, MD  fluticasone (FLONASE) 50 MCG/ACT nasal spray Place 2 sprays into both nostrils daily. Patient taking differently: Place 2 sprays into both nostrils daily as needed for allergies.  08/20/13  Yes Nilda Simmer, NP  HYDROcodone-acetaminophen (NORCO/VICODIN) 5-325 MG tablet Take 1 tablet by mouth every 4 (four) hours as needed. Patient taking differently: Take 1 tablet by mouth every 4 (four) hours as needed for moderate pain.  05/10/17  Yes Kathyrn Drown, MD  Insulin Pen Needle (PEN NEEDLES 31GX5/16") 31G X 8 MM MISC Use daily as directed 07/16/13  Yes Nilda Simmer, NP  ketoconazole (NIZORAL) 2 % cream Apply 1 application topically 2 (two) times daily. Patient taking differently: Apply 1 application topically 3 (three) times a week.  07/25/16  Yes Luking, Elayne Snare, MD  lisinopril-hydrochlorothiazide (PRINZIDE,ZESTORETIC) 20-12.5 MG tablet TAKE 1 TABLET BY MOUTH DAILY FOR FOR BLOOD PRESSURE 09/08/16  Yes Luking, Elayne Snare, MD  meclizine (ANTIVERT) 25 MG tablet Take 1 tablet (25 mg total) by mouth 3 (three) times daily as needed for dizziness. 09/06/16  Yes Lily Kocher, PA-C  metFORMIN (GLUCOPHAGE) 500 MG tablet TAKE 1 TABLET BY MOUTH 2 TIMES DAILY WITH A MEAL. Patient taking differently: Take 1,000 mg by mouth daily with breakfast.  09/08/16  Yes Luking, Elayne Snare, MD  naproxen sodium (ANAPROX) 220 MG tablet Take 220-440 mg by mouth daily as needed (for pain).   Yes [provider]  omega-3 acid ethyl esters (LOVAZA) 1 g capsule Take 1 capsule (1 g total) by mouth 2 (two) times daily. Patient taking differently: Take 1 capsule by mouth daily.  09/08/16  Yes Luking, Elayne Snare, MD  pantoprazole (PROTONIX) 40 MG tablet TAKE 1 TABLET BY MOUTH ONCE DAILY FOR ACID REFLUX Patient taking differently: Take 40 mg by  mouth daily as needed (acid reflux).  09/08/16  Yes Kathyrn Drown, MD  pravastatin (PRAVACHOL) 40 MG tablet Take 1 tablet (40 mg total) by mouth daily. 01/12/17  Yes Kathyrn Drown, MD  triamcinolone cream (KENALOG) 0.1 % Apply 1 application topically 2 (two) times daily. Patient taking differently: Apply 1 application topically 3 (three) times a week.  07/25/16  Yes Kathyrn Drown, MD  VICTOZA 18 MG/3ML SOPN INJECT 1.2 MG SUBQ ONCE DAILY 04/03/16  Yes Nilda Simmer, NP    Physical Exam: Vitals:   05/11/17 2000 05/11/17 2100 05/11/17 2200 05/11/17 2230  BP: (!) 160/88 (!) 169/73 (!) 166/79 (!) 167/74  Pulse: (!) 58 (!) 56 (!) 59 65  Resp:      Temp:      TempSrc:      SpO2: 95% 96% 96% 94%      Constitutional: Moderately built and nourished.  Vitals:   05/11/17 2000 05/11/17 2100 05/11/17 2200 05/11/17 2230  BP: (!) 160/88 (!) 169/73 (!) 166/79 (!) 167/74  Pulse: (!) 58 (!) 56 (!) 59 65  Resp:      Temp:      TempSrc:      SpO2: 95% 96% 96% 94%   Eyes: Anicteric no pallor. ENMT: No discharge from the ears eyes nose or mouth. Neck: No mass felt.  No neck rigidity.  No JVD appreciated. Respiratory: No rhonchi or crepitations. Cardiovascular: S1-S2 heard no murmurs appreciated. Abdomen: Soft nontender bowel sounds present. Musculoskeletal: No edema.  No joint effusion. Skin: No rash.  Skin appears warm. Neurologic: Alert awake oriented to time place and person.  Moves all extremities 5 x 5.  No facial asymmetry tongue is midline. Psychiatric: Appears normal.  Normal affect.   Labs on Admission: I have personally reviewed following labs and imaging studies  CBC: Recent Labs  Lab 05/11/17 1615  WBC 4.3  NEUTROABS 1.9  HGB 14.3  HCT 40.9  MCV 88.3  PLT 027   Basic Metabolic Panel: Recent Labs  Lab 05/11/17 0920 05/11/17 1615  NA  --  138  K  --  3.7  CL  --  104  CO2  --  23  GLUCOSE  --  106*  BUN  --  5*  CREATININE 0.70 0.70  CALCIUM  --  9.7    GFR: Estimated Creatinine Clearance: 89.7 mL/min (by C-G formula based on SCr of 0.7 mg/dL). Liver Function Tests: Recent Labs  Lab 05/11/17 1615  AST 27  ALT 20  ALKPHOS 59  BILITOT 0.9  PROT 7.4  ALBUMIN 4.4   No results for input(s): LIPASE, AMYLASE in the last 168 hours. No results for input(s): AMMONIA in the last 168 hours. Coagulation Profile: No results for input(s): INR, PROTIME in the last 168 hours. Cardiac Enzymes: No results for input(s): CKTOTAL, CKMB, CKMBINDEX, TROPONINI in the last 168 hours. BNP (last 3 results) No results for input(s): PROBNP in the last 8760 hours. HbA1C: No results for input(s): HGBA1C in the last 72 hours. CBG: No results for input(s): GLUCAP in the last 168 hours. Lipid Profile: No results for input(s): CHOL, HDL, LDLCALC, TRIG, CHOLHDL, LDLDIRECT in the last 72 hours. Thyroid Function Tests: No results for input(s): TSH, T4TOTAL, FREET4, T3FREE, THYROIDAB in the last 72 hours. Anemia Panel: No results for input(s): VITAMINB12, FOLATE, FERRITIN, TIBC, IRON, RETICCTPCT in the last 72 hours. Urine analysis:    Component Value Date/Time   COLORURINE YELLOW 09/06/2016 0756   APPEARANCEUR CLEAR 09/06/2016 0756   LABSPEC 1.015 09/06/2016 0756   PHURINE 7.0 09/06/2016 0756   GLUCOSEU NEGATIVE 09/06/2016 0756   HGBUR NEGATIVE 09/06/2016 0756   BILIRUBINUR NEGATIVE 09/06/2016 0756   BILIRUBINUR ++ 01/27/2015 1559   KETONESUR NEGATIVE 09/06/2016 0756   PROTEINUR NEGATIVE 09/06/2016 0756   UROBILINOGEN 1.0 07/17/2014 1435   NITRITE NEGATIVE 09/06/2016 0756   LEUKOCYTESUR NEGATIVE 09/06/2016 0756   Sepsis Labs: @LABRCNTIP (procalcitonin:4,lacticidven:4) ) Recent Results (from the past 240 hour(s))  CSF culture     Status: None (Preliminary result)   Collection Time: 05/11/17  8:59 PM  Result Value Ref Range Status   Specimen Description CSF  Final   Special Requests NONE  Final   Gram Stain   Final    CYTOSPIN SMEAR WBC  PRESENT, PREDOMINANTLY PMN NO ORGANISMS SEEN    Culture PENDING  Incomplete   Report Status PENDING  Incomplete  Radiological Exams on Admission: Mr Jeri Cos [place] Contrast  Result Date: 05/11/2017 CLINICAL DATA:  Persistent headaches.  No known injury. EXAM: MRI HEAD WITHOUT AND WITH CONTRAST TECHNIQUE: Multiplanar, multiecho pulse sequences of the brain and surrounding structures were obtained without and with intravenous contrast. CONTRAST:  82mL MULTIHANCE GADOBENATE DIMEGLUMINE 529 MG/ML IV SOLN COMPARISON:  CT head without contrast 09/06/2016 FINDINGS: Brain: Vascular enhancement is noted throughout prominent dilated perivascular spaces, particularly over the frontal lobe convexity bilaterally. There is no confluent enhancement. No discrete mass lesion is present. There is a T2 signal change are most notable within the right lentiform nucleus and thalamus bilaterally, left greater than right. T2 signal changes extend into the brainstem. Left greater than right periventricular T2 changes corresponds with the areas of enhancement. Internal auditory canals are within normal limits bilaterally. There is no significant dural enhancement. Vascular: Flow is present in the major intracranial arteries. Skull and upper cervical spine: The skullbase is within normal limits. The craniocervical junction is normal. Sinuses/Orbits: Mild mucosal thickening is present in the maxillary sinuses, right greater than left. The globes and orbits are within normal limits. IMPRESSION: 1. Extensive enhancement within dilated perivascular spaces. 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 Recommend lumbar puncture and CSF analysis. 2. T2 signal changes in the basal ganglia and left greater than right frontal lobe white matter. These results were called by telephone at the time of interpretation on 05/11/2017 at 10:49 am to Dr. Sallee Lange , who verbally acknowledged these results. Electronically Signed   By:  San Morelle M.D.   On: 05/11/2017 10:49   Dg Lumbar Puncture Fluoro Guide  Result Date: 05/11/2017 CLINICAL DATA:  Two week history of headache and vision changes. Palpation MRI today showed changes concerning for possible meningitis. Unsuccessful attempts at lumbar puncture in the ED tonight. EXAM: DIAGNOSTIC LUMBAR PUNCTURE UNDER FLUOROSCOPIC GUIDANCE FLUOROSCOPY TIME:  Fluoroscopy Time:  1 minutes 12 seconds Radiation Exposure Index (if provided by the fluoroscopic device): Dose area product is 229 micro Gy meter squared. Reference air car month 21.4 mGy. Number of Acquired Spot Images: 1 PROCEDURE: Informed consent was obtained from the patient prior to the procedure, including potential complications of headache, allergy, and pain. With the patient prone, the lower back was prepped with Betadine. 1% Lidocaine was used for local anesthesia. Lumbar puncture was performed at the L2-3 level using a 20 gauge needle with return of clear colorless CSF. 8 ml of CSF were obtained for laboratory studies. The patient tolerated the procedure well and there were no apparent complications. IMPRESSION: Lumbar puncture performed under fluoroscopic guidance without complication. 8 mL clear colorless CSF was obtained for laboratory evaluation. Electronically Signed   By: Lucienne Capers M.D.   On: 05/11/2017 21:33     Assessment/Plan Principal Problem:   Headache Active Problems:   Essential hypertension   Diabetes mellitus type II, controlled ([place])    1. Headache with possible meningitis -appreciate neurology consult.  As per neurology differentials include angiocentric lymphoma, neurosarcoidosis, Behcet's disease, vasculitis and unusual presentation of lupus.  Discussed with neurologist.  Additional labs have been ordered for CSF including HSV titers to EBV titer VDRL ACE levels and repeat lumbar puncture will need to be done in the morning to check for opening pressure and cytology and flow cytometry.   Chest x-ray has been ordered to rule out any lymphadenopathy to rule out pulmonary sarcoidosis and eventually will need CT chest.  MRI of the cervical thoracic and lumbar spine has  been ordered with and without contrast. 2. Hypertension uncontrolled -we will continue amlodipine and lisinopril will hold hydrochlorothiazide since patient is receiving fluids.  Will keep patient on PRN IV hydralazine for systolic blood pressure more than 160. 3. Diabetes mellitus type 2 on Victoza and I have placed patient on sliding scale coverage.  Holding metformin while inpatient. 4. Hyperlipidemia on statins.   DVT prophylaxis: SCDs. Code Status: Full code. Family Communication: Discussed with patient. Disposition Plan: Home. Consults called: Neurology. Admission status: Observation.   Rise Patience MD [facility] Pager [phone].  If 7PM-7AM, please contact night-coverage [email or web address] Password [email or web address]  05/12/2017, 1:53 AM

## 2017-05-13 ENCOUNTER — Encounter (HOSPITAL_COMMUNITY): Payer: Self-pay | Admitting: Radiology

## 2017-05-13 ENCOUNTER — Inpatient Hospital Stay (HOSPITAL_COMMUNITY): Payer: 59

## 2017-05-13 DIAGNOSIS — R9389 Abnormal findings on diagnostic imaging of other specified body structures: Secondary | ICD-10-CM

## 2017-05-13 LAB — GLUCOSE, CAPILLARY
Glucose-Capillary: 90 mg/dL (ref 65–99)
Glucose-Capillary: 186 mg/dL — ABNORMAL HIGH (ref 65–99)
Glucose-Capillary: 126 mg/dL — ABNORMAL HIGH (ref 65–99)
Glucose-Capillary: 161 mg/dL — ABNORMAL HIGH (ref 65–99)
Glucose-Capillary: 133 mg/dL — ABNORMAL HIGH (ref 65–99)

## 2017-05-13 LAB — BASIC METABOLIC PANEL
Anion gap: 6 (ref 5–15)
BUN: 9 mg/dL (ref 6–20)
CO2: 23 mmol/L (ref 22–32)
Calcium: 8.9 mg/dL (ref 8.9–10.3)
Chloride: 103 mmol/L (ref 101–111)
Creatinine, Ser: 0.63 mg/dL (ref 0.44–1.00)
GFR calc Af Amer: >60 mL/min (ref >60)
GFR calc non Af Amer: >60 mL/min (ref >60)
Glucose, Bld: 108 mg/dL — ABNORMAL HIGH (ref 65–99)
Potassium: 3.7 mmol/L (ref 3.5–5.1)
Sodium: 132 mmol/L — ABNORMAL LOW (ref 135–145)

## 2017-05-13 LAB — CBC
HCT: 36.8 % (ref 36.0–46.0)
Hemoglobin: 12.6 g/dL (ref 12.0–15.0)
MCH: 30.3 pg (ref 26.0–34.0)
MCHC: 34.2 g/dL (ref 30.0–36.0)
MCV: 88.5 fL (ref 78.0–100.0)
Platelets: 182 10*3/uL (ref 150–400)
RBC: 4.16 MIL/uL (ref 3.87–5.11)
RDW: 12.4 % (ref 11.5–15.5)
WBC: 3.5 10*3/uL — ABNORMAL LOW (ref 4.0–10.5)

## 2017-05-13 MED ORDER — HYDROCODONE-ACETAMINOPHEN 5-325 MG PO TABS
1.0000 | ORAL_TABLET | Freq: Four times a day (QID) | ORAL | Status: DC | PRN
  Administered 2017-05-14 – 2017-05-16 (×2): 1 via ORAL
  Filled 2017-05-13 (×2): qty 1

## 2017-05-13 MED ORDER — LORAZEPAM 2 MG/ML IJ SOLN
INTRAMUSCULAR | Status: AC
  Administered 2017-05-13: 1 mg via INTRAVENOUS
  Filled 2017-05-13: qty 1

## 2017-05-13 MED ORDER — LORAZEPAM 2 MG/ML IJ SOLN
1.0000 mg | Freq: Once | INTRAMUSCULAR | Status: AC
  Administered 2017-05-13: 1 mg via INTRAVENOUS

## 2017-05-13 MED ORDER — LIRAGLUTIDE 18 MG/3ML ~~LOC~~ SOPN
1.2000 mg | PEN_INJECTOR | Freq: Every day | SUBCUTANEOUS | Status: DC
  Administered 2017-05-14 – 2017-05-16 (×3): 1.2 mg via SUBCUTANEOUS
  Filled 2017-05-13 (×2): qty 3

## 2017-05-13 MED ORDER — ALPRAZOLAM 0.5 MG PO TABS
0.5000 mg | ORAL_TABLET | Freq: Every day | ORAL | Status: DC
  Administered 2017-05-13 – 2017-05-15 (×3): 0.5 mg via ORAL
  Filled 2017-05-13 (×3): qty 1

## 2017-05-13 MED ORDER — IOPAMIDOL (ISOVUE-300) INJECTION 61%
INTRAVENOUS | Status: AC
  Administered 2017-05-13: 100 mL
  Filled 2017-05-13: qty 100

## 2017-05-13 MED ORDER — GADOBENATE DIMEGLUMINE 529 MG/ML IV SOLN
20.0000 mL | Freq: Once | INTRAVENOUS | Status: AC
  Administered 2017-05-13: 18 mL via INTRAVENOUS

## 2017-05-13 NOTE — Progress Notes (Addendum)
NEUROHOSPITALISTS - DAILY PROGRESS NOTE   SUBJECTIVE: Family is at the bedside. Patient is found laying in bed in NAD. Overall she feels her condition is unchanged. Voices no new complaints. No new/acute events reported overnight. Recently returned to room from MRI testing.     OBJECTIVE Exam: Temp:  [98 F (36.7 C)-98.4 F (36.9 C)] 98.3 F (36.8 C) (01/06 0936) Pulse Rate:  [58-73] 72 (01/06 0936) Resp:  [16-18] 16 (01/06 0936) BP: (132-148)/(53-83) 132/83 (01/06 0936) SpO2:  [93 %-97 %] 97 % (01/06 0936) General - Well nourished, well developed, in no apparent distress HEENT-  Normocephalic, Normal external eye/conjunctiva.  Normal external ears. Normal external nose, mucus membranes and septum.   + enlarged lymph node behind Right ear Cardiovascular - Regular rate and rhythm  Respiratory - Lungs clear bilaterally. No wheezing. Abdomen - soft and non-tender, BS normal Extremities- no edema or cyanosis Skin- quarter sized area, left lower abdomen, blue tinged, slightly raised, sore to the touch Neurological Examination Mental Status: Alert, fully oriented, thought content appropriate. Memory intact to detailed recollection of her symptoms and recent events.  Speech fluent without evidence of aphasia.  Able to follow all commands without difficulty. Cranial Nerves: II: Visual fields intact except for possible mild blurring in a temporal crescentic distribution to left eye.   III,IV, VI: EOMI except for mildly decreased supraduction of right eye relative to left with upgaze. No nystagmus.  V,VII: No facial droop. Facial temp sensation normal bilaterally VIII: Hearing intact to conversation IX,X: No hypophonia or hoarseness XI: Symmetric shoulder shrug XII: midline tongue extension Motor: Right :  Upper extremity   5/5                                      Left:     Upper extremity   5/5             Lower extremity   5/5                                                   Lower extremity   5/5 Normal tone throughout; no atrophy noted Sensory: Temp and light touch intact throughout. No extinction.  Deep Tendon Reflexes:  2+ brachiradialis and biceps bilaterally. Positive Hoffman's sign bilaterally (subtle finding) Patellae trace reflexes in the context of guarding. Achilles 1+ bilaterally. Toes downgoing.  Cerebellar: No ataxia with FNF bilaterally.  Gait: No truncal ataxia  Pertinent Labs/Diagnostics: CBC:  Recent Labs  Lab 05/11/17 1615 05/12/17 0354 05/13/17 0317  WBC 4.3 3.5* 3.5*  HGB 14.3 12.6 12.6  HCT 40.9 37.3 36.8  MCV 88.3 88.8 88.5  PLT 193 173 182   BMP: Recent Labs  Lab 05/11/17 0920 05/11/17 1615 05/12/17 0354 05/13/17 0317  NA  --  138 136 132*  K  --  3.7 3.5 3.7  CL  --  104 101 103  CO2  --  23 25 23   GLUCOSE  --  106* 154* 108*  BUN  --  5* 8 9  CREATININE 0.70 0.70 0.79 0.63  CALCIUM  --  9.7 9.2 8.9   Recent Results (from the past 240 hour(s))  CSF culture     Status: None (Preliminary result)   Collection Time: 05/11/17  8:59  PM  Result Value Ref Range Status   Specimen Description CSF  Final   Special Requests NONE  Final   Gram Stain   Final    CYTOSPIN SMEAR WBC PRESENT, PREDOMINANTLY PMN NO ORGANISMS SEEN    Culture NO GROWTH 2 DAYS  Final   Report Status PENDING  Incomplete   IMAGING: I have personally reviewed the radiological images below and agree with the radiology interpretations. Dg Chest 2 View Result Date: 05/12/2017 IMPRESSION: No acute pulmonary process.   Mr Jeri Cos [place] Contrast Result Date: 05/11/2017 IMPRESSION: 1. Extensive enhancement within dilated perivascular spaces. 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 Recommend lumbar puncture and CSF analysis. 2. T2 signal changes in the basal ganglia and left greater than right frontal lobe white matter.  CT CHEST WITH CONTRAST 05/13/2017 IMPRESSION: Negative CT chest.  MRI Cervical, Lumbar and Thoracic Spine    IMPRESSION: 1. Normal appearance of the spinal cord. 2. 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 3. Minimal spondylosis throughout the spine. No evidence of neural impingement.                                    IMPRESSION: 52 year old female with a 2.5 week history of severe throbbing headaches, intermittent dry cough and severe waxing and waning low back pain  DDx: possible meningitis, angiocentric lymphoma, neurosarcoidisis, Behcet's disease, VZV vasculitis, other vasculitis, unusual presentation of CNS lupus,  possible neurosarcoidosis,  fungal infection, lymphomatous infiltration or vasculitic changes  05/13/2017:  ASSESSMENT: Neuro exam remains stable.  CT Chest negative. MRI imaging -no malignancy noted. CSF Cx - NGTF. Remains afebrile. Remainder of labs pending. Headache quality unchanged. Denies back pain on exam.  Will obtain CT Abd/Pelvis to complete imaging. We will continue to monitor closely.  Outstanding Work-up Studies:     CT Abdomen and Pelvis Multiple labs and CSF studies  PLAN  05/13/2017: Closely monitor imaging and lab results Continue to treat H/A symptoms PRN  Hospital day # 1 FAMILY UPDATES: family at bedside  TEAM UPDATES: Mendel Corning, MD  Mary Sella. ANP-C [facility] 05/13/2017, 12:19 PM  If 7pm to 7am, please call on call as listed on AMION.   NEUROHOSPITALIST ADDENDUM Agree with plan documented by ARNP. Added CT abdomen/pelvis to look for maligancy.    Karena Addison Lynn Sissel MD [facility] [phone]  If 7pm to 7am, please call on call as listed on AMION.

## 2017-05-13 NOTE — Progress Notes (Addendum)
Pt left floor for MRI at 1029

## 2017-05-13 NOTE — Progress Notes (Signed)
[facility]                                                                              Patient Demographics  Amy Dean, is a 52 y.o. female, DOB - [birth year], DXA:[id number]  Admit date - 05/11/2017   Admitting Physician Rise Patience, MD  Outpatient Primary MD for the patient is Kathyrn Drown, MD  Outpatient specialists:   LOS - 1  days   Medical records reviewed and are as summarized below:    Chief Complaint  Patient presents with  . Migraine       Brief summary    Amy Dean is a 52 y.o. 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  Patient had been having headaches for the last 2-1/2 weeks which is globally with throbbing type at times 10 out of 10.  Last few days patient also has been having some left blurred vision.  Around 6 days ago patient also had transient difficulty getting the left ears which resolved.  Denied any weakness of upper or lower extremity.  Despite taking pain relief medications patient headache was still persistent. 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 -Neurology was consulted, underwent LP.  CSF showed protein 69, WBCs 26 predominantly lymphocytic and low glucose 51. -Cryptococcal antigen negative, LFTs normal, CSF cultures negative so far.  No pressure reading was done.  ESR 3 -Workup in progress to rule out neurosarcoidosis, Behcet's disease, vasculitis.  - Rheumatological workup/labs negative so far -Chest x-ray  negative, CT chest negative  -Follow MRI of the cervical spine, thoracic spine and lumbar spine -Follow neurology recommendations regarding repeat spinal tap -Headache improving, 1/10   Active Problems:   Essential hypertension -BP stable, continue amlodipine, lisinopril    Diabetes mellitus type II, controlled ([place]) -Continue sliding scale insulin CBGs controlled  Chronic pain syndrome/chronic back pain -Continue Percocet as needed and Xanax at bedtime per home regimen   Code Status: Full CODE STATUS DVT Prophylaxis: SCD's Family Communication: Discussed in detail with the patient, all imaging results, lab results explained to the patient   Disposition Plan:   Time Spent in minutes   25 minutes  Procedures:  spinal tap  Consultants:   neuro  Antimicrobials:      Medications  Scheduled Meds: . ALPRAZolam  0.5 mg Oral QHS  . amLODipine  10 mg Oral Daily  . citalopram  20 mg Oral Daily  . gadobenate dimeglumine  20 mL Intravenous Once  . insulin aspart  0-9 Units Subcutaneous TID WC  . liraglutide  1.2 mg Subcutaneous Daily  . lisinopril  20 mg Oral Daily  . loratadine  10 mg Oral Daily  . omega-3 acid ethyl esters  1 capsule Oral  Daily  . pneumococcal 23 valent vaccine  0.5 mL Intramuscular Tomorrow-1000  . pravastatin  40 mg Oral Daily   Continuous Infusions:  PRN Meds:.acetaminophen **OR** acetaminophen, hydrALAZINE, HYDROcodone-acetaminophen, ondansetron **OR** ondansetron (ZOFRAN) IV   Antibiotics   Anti-infectives (From admission, onward)   None        Subjective:   Amy Dean was seen and examined today.  Feels a lot better today, headache 1/10, no significant blurry vision, no neurological deficits.  No focal weakness, numbness or tingling.  Patient denies dizziness, chest pain, shortness of breath, abdominal pain, N/V/D/C.  No fevers  Objective:   Vitals:   05/13/17 0101 05/13/17 0508 05/13/17 0637 05/13/17 0936  BP: (!) 148/80  (!) 148/53  132/83  Pulse: 68 73  72  Resp: '18 18  16  '$ Temp: 98 F (36.7 C)  98.4 F (36.9 C) 98.3 F (36.8 C)  TempSrc: Oral  Oral   SpO2: 93% 93%  97%  Weight:      Height:        Intake/Output Summary (Last 24 hours) at 05/13/2017 1252 Last data filed at 05/12/2017 1900 Gross per 24 hour  Intake 1200 ml  Output -  Net 1200 ml     Wt Readings from Last 3 Encounters:  05/12/17 90.7 kg (200 lb)  07/17/14 85.7 kg (189 lb)  04/01/14 85.3 kg (188 lb)     Exam    General: Alert and oriented x 3, NAD  Eyes:   HEENT:    Cardiovascular: S1 S2 clear RRR  No pedal edema b/l  Respiratory: CTA B  Gastrointestinal: Soft, nontender, nondistended, + bowel sounds  Ext: no pedal edema bilaterally  Neuro: AAOx3, Cr N's II- XII. Strength 5/5 upper and lower extremities bilaterally, speech clear  Musculoskeletal: No digital cyanosis, clubbing  Skin: No rashes  Psych: Normal affect and demeanor, alert and oriented x3    Data Reviewed:  I have personally reviewed following labs and imaging studies  Micro Results Recent Results (from the past 240 hour(s))  CSF culture     Status: None (Preliminary result)   Collection Time: 05/11/17  8:59 PM  Result Value Ref Range Status   Specimen Description CSF  Final   Special Requests NONE  Final   Gram Stain   Final    CYTOSPIN SMEAR WBC PRESENT, PREDOMINANTLY PMN NO ORGANISMS SEEN    Culture NO GROWTH 2 DAYS  Final   Report Status PENDING  Incomplete    Radiology Reports Dg Chest 2 View  Result Date: 05/12/2017 CLINICAL DATA:  Headache today. EXAM: CHEST  2 VIEW COMPARISON:  Radiograph 09/17/2008 FINDINGS: The cardiomediastinal contours are normal. The lungs are clear. Pulmonary vasculature is normal. No consolidation, pleural effusion, or pneumothorax. No acute osseous abnormalities are seen. IMPRESSION: No acute pulmonary process. Electronically Signed   By: Jeb Levering M.D.   On: 05/12/2017 06:45   Ct Chest W  Contrast  Result Date: 05/12/2017 CLINICAL DATA:  Shortness of breath, cough, low back pain EXAM: CT CHEST WITH CONTRAST TECHNIQUE: Multidetector CT imaging of the chest was performed during intravenous contrast administration. CONTRAST:  66m ISOVUE-300 IOPAMIDOL (ISOVUE-300) INJECTION 61% COMPARISON:  Chest radiographs dated 05/12/2017 FINDINGS: Cardiovascular: Heart is normal in size.  No pericardial effusion. No evidence of thoracic aortic aneurysm. Mediastinum/Nodes: Small mediastinal lymph nodes which do not meet pathologic CT size criteria. Visualized thyroid is unremarkable. Lungs/Pleura: Lungs are essentially clear. Minimal dependent atelectasis in the bilateral upper and lower lobes. No suspicious  pulmonary nodules. No focal consolidation. No pleural effusion or pneumothorax. Upper Abdomen: Visualized upper abdomen is unremarkable. Musculoskeletal: Degenerative changes of the visualized thoracolumbar spine. IMPRESSION: Negative CT chest. Electronically Signed   By: Julian Hy M.D.   On: 05/12/2017 16:13   Mr Jeri Cos XK Contrast  Result Date: 05/11/2017 CLINICAL DATA:  Persistent headaches.  No known injury. EXAM: MRI HEAD WITHOUT AND WITH CONTRAST TECHNIQUE: Multiplanar, multiecho pulse sequences of the brain and surrounding structures were obtained without and with intravenous contrast. CONTRAST:  56m MULTIHANCE GADOBENATE DIMEGLUMINE 529 MG/ML IV SOLN COMPARISON:  CT head without contrast 09/06/2016 FINDINGS: Brain: Vascular enhancement is noted throughout prominent dilated perivascular spaces, particularly over the frontal lobe convexity bilaterally. There is no confluent enhancement. No discrete mass lesion is present. There is a T2 signal change are most notable within the right lentiform nucleus and thalamus bilaterally, left greater than right. T2 signal changes extend into the brainstem. Left greater than right periventricular T2 changes corresponds with the areas of enhancement.  Internal auditory canals are within normal limits bilaterally. There is no significant dural enhancement. Vascular: Flow is present in the major intracranial arteries. Skull and upper cervical spine: The skullbase is within normal limits. The craniocervical junction is normal. Sinuses/Orbits: Mild mucosal thickening is present in the maxillary sinuses, right greater than left. The globes and orbits are within normal limits. IMPRESSION: 1. Extensive enhancement within dilated perivascular spaces. 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 Recommend lumbar puncture and CSF analysis. 2. T2 signal changes in the basal ganglia and left greater than right frontal lobe white matter. These results were called by telephone at the time of interpretation on 05/11/2017 at 10:49 am to Dr. SSallee Lange, who verbally acknowledged these results. Electronically Signed   By: CSan MorelleM.D.   On: 05/11/2017 10:49   Dg Lumbar Puncture Fluoro Guide  Result Date: 05/11/2017 CLINICAL DATA:  Two week history of headache and vision changes. Palpation MRI today showed changes concerning for possible meningitis. Unsuccessful attempts at lumbar puncture in the ED tonight. EXAM: DIAGNOSTIC LUMBAR PUNCTURE UNDER FLUOROSCOPIC GUIDANCE FLUOROSCOPY TIME:  Fluoroscopy Time:  1 minutes 12 seconds Radiation Exposure Index (if provided by the fluoroscopic device): Dose area product is 229 micro Gy meter squared. Reference air car month 21.4 mGy. Number of Acquired Spot Images: 1 PROCEDURE: Informed consent was obtained from the patient prior to the procedure, including potential complications of headache, allergy, and pain. With the patient prone, the lower back was prepped with Betadine. 1% Lidocaine was used for local anesthesia. Lumbar puncture was performed at the L2-3 level using a 20 gauge needle with return of clear colorless CSF. 8 ml of CSF were obtained for laboratory studies. The patient tolerated the  procedure well and there were no apparent complications. IMPRESSION: Lumbar puncture performed under fluoroscopic guidance without complication. 8 mL clear colorless CSF was obtained for laboratory evaluation. Electronically Signed   By: WLucienne CapersM.D.   On: 05/11/2017 21:33    Lab Data:  CBC: Recent Labs  Lab 05/11/17 1615 05/12/17 0354 05/13/17 0317  WBC 4.3 3.5* 3.5*  NEUTROABS 1.9 2.0  --   HGB 14.3 12.6 12.6  HCT 40.9 37.3 36.8  MCV 88.3 88.8 88.5  PLT 193 173 1481  Basic Metabolic Panel: Recent Labs  Lab 05/11/17 0920 05/11/17 1615 05/12/17 0354 05/13/17 0317  NA  --  138 136 132*  K  --  3.7 3.5 3.7  CL  --  104 101 103  CO2  --  '23 25 23  '$ GLUCOSE  --  106* 154* 108*  BUN  --  5* 8 9  CREATININE 0.70 0.70 0.79 0.63  CALCIUM  --  9.7 9.2 8.9   GFR: Estimated Creatinine Clearance: 89.7 mL/min (by C-G formula based on SCr of 0.63 mg/dL). Liver Function Tests: Recent Labs  Lab 05/11/17 1615 05/12/17 0354  AST 27 22  ALT 20 19  ALKPHOS 59 52  BILITOT 0.9 0.9  PROT 7.4 6.8  ALBUMIN 4.4 3.6   No results for input(s): LIPASE, AMYLASE in the last 168 hours. No results for input(s): AMMONIA in the last 168 hours. Coagulation Profile: No results for input(s): INR, PROTIME in the last 168 hours. Cardiac Enzymes: No results for input(s): CKTOTAL, CKMB, CKMBINDEX, TROPONINI in the last 168 hours. BNP (last 3 results) No results for input(s): PROBNP in the last 8760 hours. HbA1C: No results for input(s): HGBA1C in the last 72 hours. CBG: Recent Labs  Lab 05/12/17 0610 05/12/17 1126 05/12/17 1619 05/12/17 2111 05/13/17 0628  GLUCAP 99 97 174* 125* 90   Lipid Profile: No results for input(s): CHOL, HDL, LDLCALC, TRIG, CHOLHDL, LDLDIRECT in the last 72 hours. Thyroid Function Tests: No results for input(s): TSH, T4TOTAL, FREET4, T3FREE, THYROIDAB in the last 72 hours. Anemia Panel: No results for input(s): VITAMINB12, FOLATE, FERRITIN, TIBC, IRON,  RETICCTPCT in the last 72 hours. Urine analysis:    Component Value Date/Time   COLORURINE YELLOW 09/06/2016 0756   APPEARANCEUR CLEAR 09/06/2016 0756   LABSPEC 1.015 09/06/2016 0756   PHURINE 7.0 09/06/2016 0756   GLUCOSEU NEGATIVE 09/06/2016 0756   HGBUR NEGATIVE 09/06/2016 0756   BILIRUBINUR NEGATIVE 09/06/2016 0756   BILIRUBINUR ++ 01/27/2015 1559   KETONESUR NEGATIVE 09/06/2016 0756   PROTEINUR NEGATIVE 09/06/2016 0756   UROBILINOGEN 1.0 07/17/2014 1435   NITRITE NEGATIVE 09/06/2016 0756   LEUKOCYTESUR NEGATIVE 09/06/2016 0756     Ripudeep Rai M.D. [facility] 05/13/2017, 12:52 PM  Pager: [phone] Between 7am to 7pm - call Pager - [phone]  After 7pm go to [email or web address] - password [email or web address]  Call night coverage person covering after 7pm

## 2017-05-13 NOTE — Progress Notes (Signed)
Pt returned to room at 1340

## 2017-05-14 ENCOUNTER — Ambulatory Visit (HOSPITAL_COMMUNITY): Payer: 59

## 2017-05-14 ENCOUNTER — Inpatient Hospital Stay (HOSPITAL_COMMUNITY): Payer: 59

## 2017-05-14 DIAGNOSIS — R9389 Abnormal findings on diagnostic imaging of other specified body structures: Secondary | ICD-10-CM

## 2017-05-14 LAB — HSV CULTURE AND TYPING

## 2017-05-14 LAB — CBC
HCT: 38.9 % (ref 36.0–46.0)
Hemoglobin: 13.3 g/dL (ref 12.0–15.0)
MCH: 30.2 pg (ref 26.0–34.0)
MCHC: 34.2 g/dL (ref 30.0–36.0)
MCV: 88.4 fL (ref 78.0–100.0)
Platelets: 194 10*3/uL (ref 150–400)
RBC: 4.4 MIL/uL (ref 3.87–5.11)
RDW: 12.3 % (ref 11.5–15.5)
WBC: 4.1 10*3/uL (ref 4.0–10.5)

## 2017-05-14 LAB — BASIC METABOLIC PANEL
Anion gap: 8 (ref 5–15)
BUN: 7 mg/dL (ref 6–20)
CO2: 25 mmol/L (ref 22–32)
Calcium: 9.4 mg/dL (ref 8.9–10.3)
Chloride: 104 mmol/L (ref 101–111)
Creatinine, Ser: 0.7 mg/dL (ref 0.44–1.00)
GFR calc Af Amer: >60 mL/min (ref >60)
GFR calc non Af Amer: >60 mL/min (ref >60)
Glucose, Bld: 112 mg/dL — ABNORMAL HIGH (ref 65–99)
Potassium: 4 mmol/L (ref 3.5–5.1)
Sodium: 137 mmol/L (ref 135–145)

## 2017-05-14 LAB — PATHOLOGIST SMEAR REVIEW: Path Review: INCREASED

## 2017-05-14 LAB — HERPES SIMPLEX VIRUS(HSV) DNA BY PCR
HSV 1 DNA: NEGATIVE
HSV 2 DNA: NEGATIVE

## 2017-05-14 LAB — GLUCOSE, CAPILLARY
Glucose-Capillary: 110 mg/dL — ABNORMAL HIGH (ref 65–99)
Glucose-Capillary: 92 mg/dL (ref 65–99)
Glucose-Capillary: 113 mg/dL — ABNORMAL HIGH (ref 65–99)
Glucose-Capillary: 86 mg/dL (ref 65–99)

## 2017-05-14 LAB — ANGIOTENSIN CONVERTING ENZYME, CSF: Angio Convert Enzyme: 0.4 U/L (ref 0.0–2.8)

## 2017-05-14 LAB — VDRL, CSF: VDRL Quant, CSF: NONREACTIVE

## 2017-05-14 MED ORDER — LIDOCAINE HCL (PF) 1 % IJ SOLN
INTRAMUSCULAR | Status: AC
  Filled 2017-05-14: qty 5

## 2017-05-14 MED ORDER — HYDROMORPHONE HCL 1 MG/ML IJ SOLN
1.0000 mg | INTRAMUSCULAR | Status: DC | PRN
  Administered 2017-05-14: 1 mg via INTRAVENOUS
  Filled 2017-05-14: qty 1

## 2017-05-14 MED ORDER — IOPAMIDOL (ISOVUE-370) INJECTION 76%
INTRAVENOUS | Status: AC
  Administered 2017-05-14: 50 mL
  Filled 2017-05-14: qty 50

## 2017-05-14 NOTE — Progress Notes (Signed)
Dr. Tana Coast paged about patient's worsening headache not relieved by currently pain regimen,awaiting response.

## 2017-05-14 NOTE — Progress Notes (Signed)
[facility]                                                                              Patient Demographics  Amy Dean, is a 52 y.o. female, DOB - 13-Oct-1965, CHY:[id number]  Admit date - 05/11/2017   Admitting Physician Rise Patience, MD  Outpatient Primary MD for the patient is Kathyrn Drown, MD  Outpatient specialists:   LOS - 2  days   Medical records reviewed and are as summarized below:    Chief Complaint  Patient presents with  . Migraine       Brief summary    Amy Dean is a 52 y.o. 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  Patient had been having headaches for the last 2-1/2 weeks which is globally with throbbing type at times 10 out of 10.  Last few days patient also has been having some left blurred vision.  Around 6 days ago patient also had transient difficulty getting the left ears which resolved.  Denied any weakness of upper or lower extremity.  Despite taking pain relief medications patient headache was still persistent. 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 -Neurology was consulted, underwent LP.  CSF showed protein 69, WBCs 26 predominantly lymphocytic and low glucose 51. -Cryptococcal antigen negative, LFTs normal, CSF cultures negative so far.  No pressure reading was done.  ESR 3 -Workup in progress to rule out neurosarcoidosis, Behcet's disease, vasculitis.  - Rheumatological workup/labs negative so far -Chest x-ray  negative, CT chest negative.  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  Minimal spondylosis throughout the spine. -CT abdomen pelvis showed a 10 x10 x 6 millimeters right lower lobe pulmonary opacity, nonspecific, recommended repeat chest CT in 3 months or PET/CT or tissue sampling.  Mildly enlarged pelvic lymph nodes.  -Await further recommendations from neurology, headache improving   Active Problems:   Essential hypertension -BP improving, continue amlodipine, lisinopril     Diabetes mellitus type II, controlled (HCC) -Continue sliding scale insulin CBGs controlled  Chronic pain syndrome/chronic back pain -Continue Percocet as needed and Xanax at bedtime per home regimen   Code Status: Full CODE STATUS DVT Prophylaxis: SCD's Family Communication: Discussed in detail with the patient, all imaging results, lab results explained to the patient   Disposition Plan:   Time Spent in minutes   25 minutes  Procedures:  spinal tap  Consultants:   neuro  Antimicrobials:      Medications  Scheduled Meds: . ALPRAZolam  0.5 mg Oral QHS  . amLODipine  10 mg Oral Daily  .  citalopram  20 mg Oral Daily  . insulin aspart  0-9 Units Subcutaneous TID WC  . liraglutide  1.2 mg Subcutaneous Daily  . lisinopril  20 mg Oral Daily  . loratadine  10 mg Oral Daily  . omega-3 acid ethyl esters  1 capsule Oral Daily  . pneumococcal 23 valent vaccine  0.5 mL Intramuscular Tomorrow-1000  . pravastatin  40 mg Oral Daily   Continuous Infusions:  PRN Meds:.acetaminophen **OR** acetaminophen, hydrALAZINE, HYDROcodone-acetaminophen, ondansetron **OR** ondansetron (ZOFRAN) IV   Antibiotics   Anti-infectives (From admission, onward)   None        Subjective:   Amy Dean was seen and examined today.   Feels better today, headache 1/10, no blurry vision or neurological deficits.  No focal weakness.  No numbness or tingling.   Patient denies dizziness, chest pain, shortness of breath, abdominal pain, N/V/D/C.  No fevers  Objective:   Vitals:   05/13/17 1740 05/13/17 2117 05/14/17 0318 05/14/17 1018  BP: 119/68 (!) 143/70 130/75 120/70  Pulse: 77 73 74 79  Resp: _0 Temp: 98.6 F (37 C) 98.2 F (36.8 C) 98 F (36.7 C) 98 F (36.7 C)  TempSrc: Oral Oral Oral Oral  SpO2: 93% 98% 95% 98%  Weight:      Height:       No intake or output data in the 24 hours ending 05/14/17 1208   Wt Readings from Last 3 Encounters:  05/12/17 90.7 kg (200 lb)  07/17/14 85.7 kg (189 lb)  04/01/14 85.3 kg (188 lb)     Exam    General: Alert and oriented x 3, NAD  Eyes:   HEENT:    Cardiovascular: S1 S2 auscultated, no rubs, murmurs or gallops. Regular rate and rhythm. No pedal edema b/l  Respiratory: Clear to auscultation bilaterally, no wheezing, rales or rhonchi  Gastrointestinal: Soft, nontender, nondistended, + bowel sounds  Ext: no pedal edema bilaterally  Neuro: no new neurological deficits  Musculoskeletal: No digital cyanosis, clubbing  Skin: No rashes  Psych: Normal affect and demeanor, alert and oriented x3    Data Reviewed:  I have personally reviewed following labs and imaging studies  Micro Results Recent Results (from the past 240 hour(s))  CSF culture     Status: None (Preliminary result)   Collection Time: 05/11/17  8:59 PM  Result Value Ref Range Status   Specimen Description CSF  Final   Special Requests NONE  Final   Gram Stain   Final    CYTOSPIN SMEAR WBC PRESENT, PREDOMINANTLY PMN NO ORGANISMS SEEN    Culture NO GROWTH 3 DAYS  Final   Report Status PENDING  Incomplete    Radiology Reports Dg Chest 2 View  Result Date: 05/12/2017 CLINICAL DATA:  Headache today. EXAM: CHEST  2 VIEW COMPARISON:  Radiograph 09/17/2008 FINDINGS: The  cardiomediastinal contours are normal. The lungs are clear. Pulmonary vasculature is normal. No consolidation, pleural effusion, or pneumothorax. No acute osseous abnormalities are seen. IMPRESSION: No acute pulmonary process. Electronically Signed   By: Jeb Levering M.D.   On: 05/12/2017 06:45   Ct Chest W Contrast  Result Date: 05/12/2017 CLINICAL DATA:  Shortness of breath, cough, low back pain EXAM: CT CHEST WITH CONTRAST TECHNIQUE: Multidetector CT imaging of the chest was performed during intravenous contrast administration. CONTRAST:  55m ISOVUE-300 IOPAMIDOL (ISOVUE-300) INJECTION 61% COMPARISON:  Chest radiographs dated 05/12/2017 FINDINGS: Cardiovascular: Heart is normal in size.  No pericardial effusion. No evidence of thoracic  aortic aneurysm. Mediastinum/Nodes: Small mediastinal lymph nodes which do not meet pathologic CT size criteria. Visualized thyroid is unremarkable. Lungs/Pleura: Lungs are essentially clear. Minimal dependent atelectasis in the bilateral upper and lower lobes. No suspicious pulmonary nodules. No focal consolidation. No pleural effusion or pneumothorax. Upper Abdomen: Visualized upper abdomen is unremarkable. Musculoskeletal: Degenerative changes of the visualized thoracolumbar spine. IMPRESSION: Negative CT chest. Electronically Signed   By: Julian Hy M.D.   On: 05/12/2017 16:13   Mr Jeri Cos AS Contrast  Result Date: 05/11/2017 CLINICAL DATA:  Persistent headaches.  No known injury. EXAM: MRI HEAD WITHOUT AND WITH CONTRAST TECHNIQUE: Multiplanar, multiecho pulse sequences of the brain and surrounding structures were obtained without and with intravenous contrast. CONTRAST:  20m MULTIHANCE GADOBENATE DIMEGLUMINE 529 MG/ML IV SOLN COMPARISON:  CT head without contrast 09/06/2016 FINDINGS: Brain: Vascular enhancement is noted throughout prominent dilated perivascular spaces, particularly over the frontal lobe convexity bilaterally. There is no confluent  enhancement. No discrete mass lesion is present. There is a T2 signal change are most notable within the right lentiform nucleus and thalamus bilaterally, left greater than right. T2 signal changes extend into the brainstem. Left greater than right periventricular T2 changes corresponds with the areas of enhancement. Internal auditory canals are within normal limits bilaterally. There is no significant dural enhancement. Vascular: Flow is present in the major intracranial arteries. Skull and upper cervical spine: The skullbase is within normal limits. The craniocervical junction is normal. Sinuses/Orbits: Mild mucosal thickening is present in the maxillary sinuses, right greater than left. The globes and orbits are within normal limits. IMPRESSION: 1. Extensive enhancement within dilated perivascular spaces. 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 Recommend lumbar puncture and CSF analysis. 2. T2 signal changes in the basal ganglia and left greater than right frontal lobe white matter. These results were called by telephone at the time of interpretation on 05/11/2017 at 10:49 am to Dr. SSallee Lange, who verbally acknowledged these results. Electronically Signed   By: CSan MorelleM.D.   On: 05/11/2017 10:49   Mr Cervical Spine W Wo Contrast  Result Date: 05/13/2017 CLINICAL DATA:  Headaches and diffuse back pain. Possible infectious encephalitis on brain MRI. EXAM: MRI CERVICAL, THORACIC AND LUMBAR SPINE WITHOUT AND WITH CONTRAST TECHNIQUE: Multiplanar and multiecho pulse sequences of the cervical spine, to include the craniocervical junction and cervicothoracic junction, and thoracic and lumbar spine, were obtained without and with intravenous contrast. CONTRAST:  18 mL MultiHance COMPARISON:  Brain MRI 05/11/2017. Soft tissue neck CT 12/30/2008. Chest CT 05/12/2017. CT abdomen and pelvis 07/17/2014. FINDINGS: Images are intermittently mildly to moderately motion degraded throughout  examination. MRI CERVICAL SPINE FINDINGS Alignment: Cervical spine straightening.  No listhesis. Vertebrae: No fracture, suspicious osseous lesion, or significant marrow edema. Cord: Normal cord signal and morphology. No abnormal intradural enhancement. Posterior Fossa, vertebral arteries, paraspinal tissues: Partially visualized leptomeningeal enhancement in the cerebellum and possibly along the surface of the ventral brainstem. Disc levels: Minimal multilevel uncovertebral spurring without evidence of compressive neural foraminal stenosis. Widely patent spinal canal. MRI THORACIC SPINE FINDINGS Alignment:  Normal. Vertebrae: No fracture or suspicious osseous lesion. Mild degenerative endplate changes in the mid and lower thoracic spine including minimal degenerative edema. Cord: Normal cord signal and morphology. No abnormal intradural enhancement. Paraspinal and other soft tissues: Trace bilateral pleural effusions. Disc levels: Shallow left paracentral to left foraminal disc protrusion at T10-11 without significant stenosis. Minimal spondylosis elsewhere. Focally prominent right facet arthrosis at T8-9. No compressive neural foraminal  stenosis. MRI LUMBAR SPINE FINDINGS Segmentation:  Standard. Alignment:  Normal. Vertebrae: No fracture or suspicious osseous lesion. Minimal degenerative endplate edema at M3-O1. Conus medullaris and cauda equina: Conus extends to the L1 level. Conus and cauda equina appear normal. No abnormal intradural enhancement. Paraspinal and other soft tissues: Unremarkable. Disc levels: L1-2:  Minimal spondylosis.  No disc herniation or stenosis. L2-3:  Negative. L3-4: Mild disc desiccation and disc space narrowing. Minimal disc bulging without stenosis. L4-5:  Negative. L5-S1: Disc desiccation. Shallow central disc protrusion with annular fissure and minimal disc bulging without significant stenosis. IMPRESSION: 1. Normal appearance of the spinal cord. 2. Leptomeningeal enhancement in the  posterior fossa in keeping with the additional abnormal findings on recent brain MRI. 3. Minimal spondylosis throughout the spine. No evidence of neural impingement. Electronically Signed   By: Logan Bores M.D.   On: 05/13/2017 14:02   Mr Thoracic Spine W Wo Contrast  Result Date: 05/13/2017 CLINICAL DATA:  Headaches and diffuse back pain. Possible infectious encephalitis on brain MRI. EXAM: MRI CERVICAL, THORACIC AND LUMBAR SPINE WITHOUT AND WITH CONTRAST TECHNIQUE: Multiplanar and multiecho pulse sequences of the cervical spine, to include the craniocervical junction and cervicothoracic junction, and thoracic and lumbar spine, were obtained without and with intravenous contrast. CONTRAST:  18 mL MultiHance COMPARISON:  Brain MRI 05/11/2017. Soft tissue neck CT 12/30/2008. Chest CT 05/12/2017. CT abdomen and pelvis 07/17/2014. FINDINGS: Images are intermittently mildly to moderately motion degraded throughout examination. MRI CERVICAL SPINE FINDINGS Alignment: Cervical spine straightening.  No listhesis. Vertebrae: No fracture, suspicious osseous lesion, or significant marrow edema. Cord: Normal cord signal and morphology. No abnormal intradural enhancement. Posterior Fossa, vertebral arteries, paraspinal tissues: Partially visualized leptomeningeal enhancement in the cerebellum and possibly along the surface of the ventral brainstem. Disc levels: Minimal multilevel uncovertebral spurring without evidence of compressive neural foraminal stenosis. Widely patent spinal canal. MRI THORACIC SPINE FINDINGS Alignment:  Normal. Vertebrae: No fracture or suspicious osseous lesion. Mild degenerative endplate changes in the mid and lower thoracic spine including minimal degenerative edema. Cord: Normal cord signal and morphology. No abnormal intradural enhancement. Paraspinal and other soft tissues: Trace bilateral pleural effusions. Disc levels: Shallow left paracentral to left foraminal disc protrusion at T10-11  without significant stenosis. Minimal spondylosis elsewhere. Focally prominent right facet arthrosis at T8-9. No compressive neural foraminal stenosis. MRI LUMBAR SPINE FINDINGS Segmentation:  Standard. Alignment:  Normal. Vertebrae: No fracture or suspicious osseous lesion. Minimal degenerative endplate edema at R7-N1. Conus medullaris and cauda equina: Conus extends to the L1 level. Conus and cauda equina appear normal. No abnormal intradural enhancement. Paraspinal and other soft tissues: Unremarkable. Disc levels: L1-2:  Minimal spondylosis.  No disc herniation or stenosis. L2-3:  Negative. L3-4: Mild disc desiccation and disc space narrowing. Minimal disc bulging without stenosis. L4-5:  Negative. L5-S1: Disc desiccation. Shallow central disc protrusion with annular fissure and minimal disc bulging without significant stenosis. IMPRESSION: 1. Normal appearance of the spinal cord. 2. 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 3. Minimal spondylosis throughout the spine. No evidence of neural impingement. Electronically Signed   By: Logan Bores M.D.   On: 05/13/2017 14:02   Mr Lumbar Spine W Wo Contrast  Result Date: 05/13/2017 CLINICAL DATA:  Headaches and diffuse back pain. Possible infectious encephalitis on brain MRI. EXAM: MRI CERVICAL, THORACIC AND LUMBAR SPINE WITHOUT AND WITH CONTRAST TECHNIQUE: Multiplanar and multiecho pulse sequences of the cervical spine, to include the  craniocervical junction and cervicothoracic junction, and thoracic and lumbar spine, were obtained without and with intravenous contrast. CONTRAST:  18 mL MultiHance COMPARISON:  Brain MRI 05/11/2017. Soft tissue neck CT 12/30/2008. Chest CT 05/12/2017. CT abdomen and pelvis 07/17/2014. FINDINGS: Images are intermittently mildly to moderately motion degraded throughout examination. MRI CERVICAL SPINE FINDINGS Alignment: Cervical spine straightening.  No  listhesis. Vertebrae: No fracture, suspicious osseous lesion, or significant marrow edema. Cord: Normal cord signal and morphology. No abnormal intradural enhancement. Posterior Fossa, vertebral arteries, paraspinal tissues: Partially visualized leptomeningeal enhancement in the cerebellum and possibly along the surface of the ventral brainstem. Disc levels: Minimal multilevel uncovertebral spurring without evidence of compressive neural foraminal stenosis. Widely patent spinal canal. MRI THORACIC SPINE FINDINGS Alignment:  Normal. Vertebrae: No fracture or suspicious osseous lesion. Mild degenerative endplate changes in the mid and lower thoracic spine including minimal degenerative edema. Cord: Normal cord signal and morphology. No abnormal intradural enhancement. Paraspinal and other soft tissues: Trace bilateral pleural effusions. Disc levels: Shallow left paracentral to left foraminal disc protrusion at T10-11 without significant stenosis. Minimal spondylosis elsewhere. Focally prominent right facet arthrosis at T8-9. No compressive neural foraminal stenosis. MRI LUMBAR SPINE FINDINGS Segmentation:  Standard. Alignment:  Normal. Vertebrae: No fracture or suspicious osseous lesion. Minimal degenerative endplate edema at U9-N2. Conus medullaris and cauda equina: Conus extends to the L1 level. Conus and cauda equina appear normal. No abnormal intradural enhancement. Paraspinal and other soft tissues: Unremarkable. Disc levels: L1-2:  Minimal spondylosis.  No disc herniation or stenosis. L2-3:  Negative. L3-4: Mild disc desiccation and disc space narrowing. Minimal disc bulging without stenosis. L4-5:  Negative. L5-S1: Disc desiccation. Shallow central disc protrusion with annular fissure and minimal disc bulging without significant stenosis. IMPRESSION: 1. Normal appearance of the spinal cord. 2. 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  3. Minimal spondylosis throughout the spine. No evidence of neural impingement. Electronically Signed   By: Logan Bores M.D.   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 History of a supracervical hysterectomy. EXAM: CT ABDOMEN AND PELVIS WITH CONTRAST TECHNIQUE: Multidetector CT imaging of the abdomen and pelvis was performed using the standard protocol following bolus administration of intravenous contrast. CONTRAST:  138m ISOVUE-300 IOPAMIDOL (ISOVUE-300) INJECTION 61% COMPARISON:  07/17/2014 CT FINDINGS: Lower chest: Top-normal size heart without pericardial effusion. 10 x 10 x 6 mm opacity in the right lower lobe, nonspecific in appearance, and not apparent on prior comparison exam. No effusion or pneumothorax. Hepatobiliary: Is homogeneous enhancement of the liver without biliary dilatation or mass. Nondistended gallbladder. Pancreas: No pancreatic inflammation, enhancing mass or ductal dilatation. Spleen: Normal size spleen without mass. Adrenals/Urinary Tract: Normal bilateral adrenal glands. Symmetric enhancement of both kidneys without obstructive uropathy. No solid enhancing lesions. Nondistended urinary bladder which may account for the moderate thickened appearance of the bladder wall up to 9 mm in thickness. No calculus. Stomach/Bowel: Nondistended stomach. Normal small bowel rotation without mass or obstruction. Moderate fecal residue within the colon without obstruction. No inflammation is identified. Normal-appearing appendix. Vascular/Lymphatic: Mildly enlarged iliac chain lymph nodes up to 13 mm short axis on the right, slightly increased from prior which measured 10 mm in maximum short axis. Largest lymph node on the left is 11 mm short axis, stable in appearance. Mild aortoiliac atherosclerosis without aneurysm. Reproductive:  Supracervical hysterectomy with cervix likely accounting for stable soft tissue density in the lower pelvis. Stable appearance of the adnexa with elongated appearance of the left ovary containing a small cyst that measures 17 x 7 mm. The right ovary is quiescent appearance without focal mass. Idiopathic benign-appearing calcifications noted however within. Other: No free air free fluid. Musculoskeletal: No significant osseous findings. IMPRESSION: 1. 10 x 10 x 6 mm right lower lobe pulmonary opacity, nonspecific in appearance. Consider one of the following in 3 months for both low-risk and high-risk individuals: (a) repeat chest CT, (b) follow-up PET-CT, or (c) tissue sampling. This recommendation follows the consensus statement: Guidelines for Management of Incidental Pulmonary Nodules Detected on CT Images: From the Fleischner Society 2017; Radiology 2017; 284:228-243. 2. Mildly enlarged pelvic lymph nodes, the largest along the right iliac chain measuring 13 mm short axis both from 10 mm previously. Electronically Signed   By: Ashley Royalty M.D.   On: 05/14/2017 03:21   Dg Lumbar Puncture Fluoro Guide  Result Date: 05/11/2017 CLINICAL DATA:  Two week history of headache and vision changes. Palpation MRI today showed changes concerning for possible meningitis. Unsuccessful attempts at lumbar puncture in the ED tonight. EXAM: DIAGNOSTIC LUMBAR PUNCTURE UNDER FLUOROSCOPIC GUIDANCE FLUOROSCOPY TIME:  Fluoroscopy Time:  1 minutes 12 seconds Radiation Exposure Index (if provided by the fluoroscopic device): Dose area product is 229 micro Gy meter squared. Reference air car month 21.4 mGy. Number of Acquired Spot Images: 1 PROCEDURE: Informed consent was obtained from the patient prior to the procedure, including potential complications of headache, allergy, and pain. With the patient prone, the lower back was prepped with Betadine. 1% Lidocaine was used for local anesthesia. Lumbar puncture was performed at the L2-3  level using a 20 gauge needle with return of clear colorless CSF. 8 ml of CSF were obtained for laboratory studies. The patient tolerated the procedure well and there were no apparent complications. IMPRESSION: Lumbar puncture performed under fluoroscopic guidance without complication. 8 mL clear colorless CSF was obtained for laboratory evaluation. Electronically Signed   By: Lucienne Capers M.D.   On: 05/11/2017 21:33    Lab Data:  CBC: Recent Labs  Lab 05/11/17 1615 05/12/17 0354 05/13/17 0317 05/14/17 0506  WBC 4.3 3.5* 3.5* 4.1  NEUTROABS 1.9 2.0  --   --   HGB 14.3 12.6 12.6 13.3  HCT 40.9 37.3 36.8 38.9  MCV 88.3 88.8 88.5 88.4  PLT 193 173 182 443   Basic Metabolic Panel: Recent Labs  Lab 05/11/17 0920 05/11/17 1615 05/12/17 0354 05/13/17 0317 05/14/17 0506  NA  --  138 136 132* 137  K  --  3.7 3.5 3.7 4.0  CL  --  104 101 103 104  CO2  --  _0 GLUCOSE  --  106* 154* 108* 112*  BUN  --  5* _1 CREATININE 0.70 0.70 0.79 0.63 0.70  CALCIUM  --  9.7 9.2 8.9 9.4   GFR: Estimated Creatinine Clearance: 89.7 mL/min (by C-G formula based on SCr of 0.7 mg/dL). Liver Function Tests: Recent Labs  Lab 05/11/17 1615 05/12/17 0354  AST 27 22  ALT 20 19  ALKPHOS 59 52  BILITOT 0.9 0.9  PROT 7.4 6.8  ALBUMIN 4.4 3.6   No results for input(s): LIPASE, AMYLASE in the last 168 hours. No results for input(s): AMMONIA in the last 168 hours. Coagulation Profile: No results for input(s): INR, PROTIME in the last 168  hours. Cardiac Enzymes: No results for input(s): CKTOTAL, CKMB, CKMBINDEX, TROPONINI in the last 168 hours. BNP (last 3 results) No results for input(s): PROBNP in the last 8760 hours. HbA1C: No results for input(s): HGBA1C in the last 72 hours. CBG: Recent Labs  Lab 05/13/17 1602 05/13/17 1753 05/13/17 2122 05/14/17 0709 05/14/17 1127  GLUCAP 126* 161* 133* 110* 92   Lipid Profile: No results for input(s): CHOL, HDL, LDLCALC, TRIG,  CHOLHDL, LDLDIRECT in the last 72 hours. Thyroid Function Tests: No results for input(s): TSH, T4TOTAL, FREET4, T3FREE, THYROIDAB in the last 72 hours. Anemia Panel: No results for input(s): VITAMINB12, FOLATE, FERRITIN, TIBC, IRON, RETICCTPCT in the last 72 hours. Urine analysis:    Component Value Date/Time   COLORURINE YELLOW 09/06/2016 0756   APPEARANCEUR CLEAR 09/06/2016 0756   LABSPEC 1.015 09/06/2016 0756   PHURINE 7.0 09/06/2016 0756   GLUCOSEU NEGATIVE 09/06/2016 0756   HGBUR NEGATIVE 09/06/2016 0756   BILIRUBINUR NEGATIVE 09/06/2016 0756   BILIRUBINUR ++ 01/27/2015 1559   KETONESUR NEGATIVE 09/06/2016 0756   PROTEINUR NEGATIVE 09/06/2016 0756   UROBILINOGEN 1.0 07/17/2014 1435   NITRITE NEGATIVE 09/06/2016 0756   LEUKOCYTESUR NEGATIVE 09/06/2016 0756     Ripudeep Rai M.D. [facility] 05/14/2017, 12:08 PM  Pager: [phone] Between 7am to 7pm - call Pager - [phone]  After 7pm go to [email or web address] - password [email or web address]  Call night coverage person covering after 7pm

## 2017-05-14 NOTE — Procedures (Signed)
Indication: cytology  Risks of the procedure were dicussed with the patient including post-LP headache, bleeding, infection, weakness/numbness of legs(radiculopathy), death.  The patient agreed and written consent was obtained.   The patient was prepped and draped, and using sterile technique a 20 gauge quinke spinal needle was inserted in the L 3/4 and 4/5 space. Several (5) attempts were made and was not able to enter canal.  LP will need to be done under fluoroscopy.  Etta Quill PA-C [facility] [phone]  M-F  (8:30 am- 4 PM)  05/14/2017, 12:54 PM

## 2017-05-14 NOTE — Progress Notes (Signed)
Subjective: Headache is 2/10, she states that as long as she gets a headache with Tylenol it is not too bad.  Exam: Vitals:   05/14/17 0318 05/14/17 1018  BP: 130/75 120/70  Pulse: 74 79  Resp: 18 18  Temp: 98 F (36.7 C) 98 F (36.7 C)  SpO2: 95% 98%   Gen: In bed, NAD Resp: non-labored breathing, no acute distress Abd: soft, nt  Neuro: MS: Awake, alert, interactive and appropriate CN: Pupils equal round and reactive to light, visual fields full Motor: 5/5 throughout Sensory: Intact to light touch  Pertinent Labs: Studies: CSF WBC - 26(lymphs) CSF RBC - 4 CSF protein - 69 CSF glucose 51  On CSF:  Crypto Ag- negative  HSV - negative  EBV- pending  VZV-pending  Lyme Ab- pending  VDRL CSF - pending  CSF Culture - negative  HIV - negative  ESR 3 CRP - < 0.8 Anti-Jo1 - neg ENA(incldues RP, SSA, SSB, Scl-70, ENA, dsDNA) - negative Oligoclonal bands - pending   ACE, CSF- pending  Impression: 52 year old female with new onset headaches and abnormal enhancement in the perivascular spaces. The differential remains wide, but thus far labs have been relatively reassuring. I do think that cytology is needed, and therefore repeat LP is indicated. If everything is needed, then I think a short course of steroids could be done as an outpatient with plan for repeat imaging.  Recommendations: 1) CTA head and neck to evaluate for vasculitic changes 2) elevating of the testing as above 3) serum angiotensin-converting enzyme is slightly more sensitive than CSF, and also numbness 4) repeat LP for cytology  Roland Rack, MD [facility] [phone]  If 7pm- 7am, please page neurology on call as listed in [place].

## 2017-05-15 ENCOUNTER — Inpatient Hospital Stay (HOSPITAL_COMMUNITY): Payer: 59

## 2017-05-15 LAB — CBC
HCT: 40.3 % (ref 36.0–46.0)
Hemoglobin: 14.1 g/dL (ref 12.0–15.0)
MCH: 31.3 pg (ref 26.0–34.0)
MCHC: 35 g/dL (ref 30.0–36.0)
MCV: 89.4 fL (ref 78.0–100.0)
Platelets: 192 10*3/uL (ref 150–400)
RBC: 4.51 MIL/uL (ref 3.87–5.11)
RDW: 12.6 % (ref 11.5–15.5)
WBC: 4.1 10*3/uL (ref 4.0–10.5)

## 2017-05-15 LAB — BASIC METABOLIC PANEL
Anion gap: 8 (ref 5–15)
BUN: 7 mg/dL (ref 6–20)
CO2: 23 mmol/L (ref 22–32)
Calcium: 9.3 mg/dL (ref 8.9–10.3)
Chloride: 103 mmol/L (ref 101–111)
Creatinine, Ser: 0.63 mg/dL (ref 0.44–1.00)
GFR calc Af Amer: >60 mL/min (ref >60)
GFR calc non Af Amer: >60 mL/min (ref >60)
Glucose, Bld: 107 mg/dL — ABNORMAL HIGH (ref 65–99)
Potassium: 3.9 mmol/L (ref 3.5–5.1)
Sodium: 134 mmol/L — ABNORMAL LOW (ref 135–145)

## 2017-05-15 LAB — CSF CULTURE W GRAM STAIN

## 2017-05-15 LAB — ANGIOTENSIN CONVERTING ENZYME: Angiotensin-Converting Enzyme: <15 U/L (ref 14–82)

## 2017-05-15 LAB — CSF CULTURE: Culture: NO GROWTH

## 2017-05-15 LAB — CSF CELL COUNT WITH DIFFERENTIAL
Lymphs, CSF: 92 % — ABNORMAL HIGH (ref 40–80)
Monocyte-Macrophage-Spinal Fluid: 6 % — ABNORMAL LOW (ref 15–45)
RBC Count, CSF: 8 /mm3 — ABNORMAL HIGH
Segmented Neutrophils-CSF: 2 % (ref 0–6)
Tube #: 3
WBC, CSF: 48 /mm3 (ref 0–5)

## 2017-05-15 LAB — GLUCOSE, CAPILLARY
Glucose-Capillary: 111 mg/dL — ABNORMAL HIGH (ref 65–99)
Glucose-Capillary: 78 mg/dL (ref 65–99)
Glucose-Capillary: 96 mg/dL (ref 65–99)
Glucose-Capillary: 83 mg/dL (ref 65–99)

## 2017-05-15 LAB — PROTEIN, CSF: Total  Protein, CSF: 93 mg/dL — ABNORMAL HIGH (ref 15–45)

## 2017-05-15 LAB — OLIGOCLONAL BANDS, CSF + SERM

## 2017-05-15 LAB — EPSTEIN BARR VRS(EBV DNA BY PCR)
EBV DNA QN by PCR: NEGATIVE copies/mL
log10 EBV DNA Qn PCR: UNDETERMINED log10 copy/mL

## 2017-05-15 LAB — GLUCOSE, CSF: Glucose, CSF: 48 mg/dL (ref 40–70)

## 2017-05-15 MED ORDER — LIDOCAINE HCL (PF) 1 % IJ SOLN
5.0000 mL | Freq: Once | INTRAMUSCULAR | Status: AC
  Administered 2017-05-15: 3 mL via INTRADERMAL

## 2017-05-15 MED ORDER — LIDOCAINE HCL (PF) 1 % IJ SOLN
INTRAMUSCULAR | Status: AC
  Administered 2017-05-15: 3 mL via INTRADERMAL
  Filled 2017-05-15: qty 5

## 2017-05-15 MED ORDER — KETOROLAC TROMETHAMINE 30 MG/ML IJ SOLN
30.0000 mg | Freq: Once | INTRAMUSCULAR | Status: AC
  Administered 2017-05-15: 30 mg via INTRAVENOUS
  Filled 2017-05-15: qty 1

## 2017-05-15 MED ORDER — SENNOSIDES-DOCUSATE SODIUM 8.6-50 MG PO TABS
1.0000 | ORAL_TABLET | Freq: Two times a day (BID) | ORAL | Status: DC
  Administered 2017-05-15 (×2): 1 via ORAL
  Filled 2017-05-15 (×3): qty 1

## 2017-05-15 MED ORDER — PROCHLORPERAZINE EDISYLATE 5 MG/ML IJ SOLN
10.0000 mg | Freq: Once | INTRAMUSCULAR | Status: AC
  Administered 2017-05-15: 10 mg via INTRAVENOUS
  Filled 2017-05-15: qty 2

## 2017-05-15 MED ORDER — POLYETHYLENE GLYCOL 3350 17 G PO PACK: 17.0000 g | PACK | Freq: Every day | ORAL | Status: DC | PRN

## 2017-05-15 MED ORDER — DIPHENHYDRAMINE HCL 50 MG/ML IJ SOLN
12.5000 mg | Freq: Once | INTRAMUSCULAR | Status: AC
  Administered 2017-05-15: 12.5 mg via INTRAVENOUS
  Filled 2017-05-15: qty 1

## 2017-05-15 NOTE — Progress Notes (Signed)
Critical Lab   WBC, CSF was 48. Dr. Tana Coast and Dr. Leonel Ramsay made aware.

## 2017-05-15 NOTE — Progress Notes (Signed)
[facility]                                                                              Patient Demographics  Amy Dean, is a 52 y.o. female, DOB - Oct 24, 1965, MWU:[id number]  Admit date - 05/11/2017   Admitting Physician Rise Patience, MD  Outpatient Primary MD for the patient is Kathyrn Drown, MD  Outpatient specialists:   LOS - 3  days   Medical records reviewed and are as summarized below:    Chief Complaint  Patient presents with  . Migraine       Brief summary    Amy Dean is a 52 y.o. 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  Patient had been having headaches for the last 2-1/2 weeks which is globally with throbbing type at times 10 out of 10.  Last few days patient also has been having some left blurred vision.  Around 6 days ago patient also had transient difficulty getting the left ears which resolved.  Denied any weakness of upper or lower extremity.  Despite taking pain relief medications patient headache was still persistent. 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 -Neurology was consulted, underwent LP.  CSF showed protein 69, WBCs 26 predominantly lymphocytic and low glucose 51. -Cryptococcal antigen negative, LFTs normal, CSF cultures negative so far.  No pressure reading was done.  ESR 3 -Workup in progress to rule out neurosarcoidosis, Behcet's disease, vasculitis. Rheumatological workup/labs negative so far except vasculitis    -Chest x-ray negative, CT chest negative.  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  Minimal spondylosis throughout the spine. -CT abdomen pelvis showed a 10 x10 x 6 millimeters right lower lobe pulmonary opacity, nonspecific, recommended repeat chest CT in 3 months or PET/CT or tissue sampling.  Mildly enlarged pelvic lymph nodes.  -Spinal tap repeated today, opening pressure of 12 cm CSF study shows increased WBCs 48, lymphocytic 92, protein elevated 93, follow neurology recommendations  Active Problems:   Essential hypertension -BP improving, continue amlodipine, lisinopril     Diabetes mellitus type II, controlled (HCC) -Continue sliding scale insulin CBGs controlled  Chronic pain syndrome/chronic back pain -Continue Percocet as needed and Xanax at bedtime per home regimen  Constipation Add Senokot-S, MiraLAX   Code Status: Full CODE STATUS DVT Prophylaxis: SCD's Family Communication: Discussed in detail with the patient, all imaging results, lab results explained to the patient   Disposition Plan: When cleared by neurology  Time Spent in minutes   25 minutes  Procedures:  spinal tap 1/4 and 1/8  Consultants:  neuro  Antimicrobials:      Medications  Scheduled Meds: . ALPRAZolam  0.5 mg Oral QHS  . amLODipine  10 mg Oral Daily  . citalopram  20 mg Oral Daily  . insulin aspart  0-9 Units Subcutaneous TID WC  . liraglutide  1.2 mg Subcutaneous Daily  . lisinopril  20 mg Oral Daily  . loratadine  10 mg Oral Daily  . omega-3 acid ethyl esters  1 capsule Oral Daily  . pneumococcal 23 valent vaccine  0.5 mL Intramuscular Tomorrow-1000  . pravastatin  40 mg Oral Daily   Continuous Infusions:  PRN Meds:.acetaminophen **OR** acetaminophen, hydrALAZINE, HYDROcodone-acetaminophen, HYDROmorphone  (DILAUDID) injection, ondansetron **OR** ondansetron (ZOFRAN) IV   Antibiotics   Anti-infectives (From admission, onward)   None        Subjective:   Amy Dean was seen and examined today.  Last evening had severe headache, now improving, no blurry vision no neurological deficits.  No focal weakness, numbness or tingling.  Feeling constipated.  patient denies dizziness, chest pain, shortness of breath, abdominal pain, N/V/D/C.  No fevers  Objective:   Vitals:   05/14/17 1018 05/15/17 0027 05/15/17 0600 05/15/17 1019  BP:  (!) 151/83 (!) 149/91 139/72  Pulse: 79 83 83 77  Resp: '18 18 18 18  '$ Temp:  98.4 F (36.9 C) 99.1 F (37.3 C) 98.7 F (37.1 C)  TempSrc: Oral Oral Oral Oral  SpO2: 98% 98% 99% 98%  Weight:      Height:        Intake/Output Summary (Last 24 hours) at 05/15/2017 1334 Last data filed at 05/15/2017 0800 Gross per 24 hour  Intake 360 ml  Output -  Net 360 ml     Wt Readings from Last 3 Encounters:  05/12/17 90.7 kg (200 lb)  07/17/14 85.7 kg (189 lb)  04/01/14 85.3 kg (188 lb)     Exam   General: Alert and oriented x 3, NAD  Eyes:   HEENT:    Cardiovascular: S1 S2 clear, RRR No pedal edema b/l  Respiratory: Clear to auscultation bilaterally, no wheezing, rales or rhonchi  Gastrointestinal: Soft, nontender, nondistended, + bowel sounds  Ext: no pedal edema bilaterally  Neuro: AAOx3, Cr N's II- XII. Strength 5/5 upper and lower extremities bilaterally, speech clear  Musculoskeletal: No digital cyanosis, clubbing  Skin: No rashes  Psych: Normal affect and demeanor, alert and oriented x3   Data Reviewed:  I have personally reviewed following labs and imaging studies  Micro Results Recent Results (from the past 240 hour(s))  Hsv Culture And Typing     Status: None   Collection Time: 05/11/17  8:52 PM  Result Value Ref Range Status   HSV Culture/Type Comment  Final    Comment: (NOTE) Negative No Herpes simplex virus  isolated. Performed At: [facility] [place], [place] [id number] Rush Farmer MD WU:[id number]    Source of Sample CSF  Final  CSF culture     Status: None   Collection Time: 05/11/17  8:59 PM  Result Value Ref Range Status   Specimen Description CSF  Final   Special Requests NONE  Final   Gram Stain   Final    CYTOSPIN SMEAR WBC PRESENT, PREDOMINANTLY PMN NO ORGANISMS SEEN    Culture NO GROWTH 3 DAYS  Final   Report Status 05/15/2017 FINAL  Final    Radiology Reports Ct Angio Head W Or Wo Contrast  Result Date: 05/14/2017 CLINICAL DATA:  52 y/o  F; CNS vasculitis. EXAM: CT ANGIOGRAPHY HEAD AND NECK TECHNIQUE: Multidetector CT imaging of the head and neck was performed using the standard protocol during bolus administration of intravenous contrast. Multiplanar CT image reconstructions and MIPs were obtained to evaluate the vascular anatomy. Carotid stenosis measurements (when applicable) are obtained utilizing NASCET criteria, using the distal internal carotid diameter as the denominator. CONTRAST:  33m ISOVUE-370 IOPAMIDOL (ISOVUE-370) INJECTION 76% COMPARISON:  05/11/2017 MRI of the head FINDINGS: CT HEAD FINDINGS Brain: No evidence of acute infarction, hemorrhage, hydrocephalus, extra-axial collection or mass lesion/mass effect. Vascular: As below. Skull: Normal. Negative for fracture or focal lesion. Sinuses: Imaged portions are clear. Orbits: No acute finding. Review of the MIP images confirms the above findings CTA NECK FINDINGS Aortic arch: Standard branching. Imaged portion shows no evidence of aneurysm or dissection. No significant stenosis of the major arch vessel origins. Right carotid system: No evidence of dissection, stenosis (50% or greater) or occlusion. Left carotid system: No evidence of dissection, stenosis (50% or greater) or occlusion. Vertebral arteries: Codominant. No evidence of dissection, stenosis (50% or greater) or occlusion. Skeleton:  Negative. Other neck: Negative. Upper chest: Negative. Review of the MIP images confirms the above findings CTA HEAD FINDINGS Anterior circulation: No significant stenosis, proximal occlusion, aneurysm, or vascular malformation. Mild calcific atherosclerosis of carotid siphons. Posterior circulation: No significant stenosis, proximal occlusion, aneurysm, or vascular malformation. Venous sinuses: As permitted by contrast timing, patent. Anatomic variants: Anterior and bilateral posterior communicating arteries identified. Delayed phase: Small foci of enhancement on prior MRI are not appreciable on CT. Review of the MIP images confirms the above findings IMPRESSION: CT head: 1. No acute intracranial abnormality identified. 2. Tiny foci of enhancement on the prior MRI of the brain are not appreciated on CT. CTA neck: Patent carotid and vertebral arteries. No dissection, aneurysm, or significant stenosis by NASCET criteria. CTA head: Patent anterior and posterior intracranial circulation. No large vessel occlusion, aneurysm, or significant stenosis. No specific findings for vasculitis. Electronically Signed   By: LKristine GarbeM.D.   On: 05/14/2017 23:40   Dg Chest 2 View  Result Date: 05/12/2017 CLINICAL DATA:  Headache today. EXAM: CHEST  2 VIEW COMPARISON:  Radiograph 09/17/2008 FINDINGS: The cardiomediastinal contours are normal. The lungs are clear. Pulmonary vasculature is normal. No consolidation, pleural effusion, or pneumothorax. No acute osseous abnormalities are seen. IMPRESSION: No acute pulmonary process. Electronically Signed   By: MJeb LeveringM.D.   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 EXAM: CT ANGIOGRAPHY HEAD AND NECK TECHNIQUE: Multidetector CT imaging of the head and neck was performed using the standard protocol during bolus administration of intravenous contrast. Multiplanar CT image  reconstructions and MIPs were obtained to evaluate the vascular anatomy. Carotid stenosis measurements (when applicable) are obtained utilizing NASCET criteria, using the distal internal carotid diameter as the denominator. CONTRAST:  528mISOVUE-370 IOPAMIDOL (ISOVUE-370) INJECTION 76% COMPARISON:  05/11/2017 MRI of the head FINDINGS: CT HEAD FINDINGS Brain: No evidence of acute infarction, hemorrhage, hydrocephalus, extra-axial collection or mass lesion/mass effect. Vascular: As below. Skull: Normal. Negative for fracture or focal lesion. Sinuses: Imaged portions are clear. Orbits: No acute finding. Review of the MIP images confirms the above findings CTA NECK FINDINGS Aortic arch: Standard branching. Imaged portion shows no evidence of aneurysm or dissection. No significant stenosis of the major arch vessel origins. Right carotid system: No evidence of dissection, stenosis (  50% or greater) or occlusion. Left carotid system: No evidence of dissection, stenosis (50% or greater) or occlusion. Vertebral arteries: Codominant. No evidence of dissection, stenosis (50% or greater) or occlusion. Skeleton: Negative. Other neck: Negative. Upper chest: Negative. Review of the MIP images confirms the above findings CTA HEAD FINDINGS Anterior circulation: No significant stenosis, proximal occlusion, aneurysm, or vascular malformation. Mild calcific atherosclerosis of carotid siphons. Posterior circulation: No significant stenosis, proximal occlusion, aneurysm, or vascular malformation. Venous sinuses: As permitted by contrast timing, patent. Anatomic variants: Anterior and bilateral posterior communicating arteries identified. Delayed phase: Small foci of enhancement on prior MRI are not appreciable on CT. Review of the MIP images confirms the above findings IMPRESSION: CT head: 1. No acute intracranial abnormality identified. 2. Tiny foci of enhancement on the prior MRI of the brain are not appreciated on CT. CTA neck: Patent  carotid and vertebral arteries. No dissection, aneurysm, or significant stenosis by NASCET criteria. CTA head: Patent anterior and posterior intracranial circulation. No large vessel occlusion, aneurysm, or significant stenosis. No specific findings for vasculitis. Electronically Signed   By: Kristine Garbe M.D.   On: 05/14/2017 23:40   Ct Chest W Contrast  Result Date: 05/12/2017 CLINICAL DATA:  Shortness of breath, cough, low back pain EXAM: CT CHEST WITH CONTRAST TECHNIQUE: Multidetector CT imaging of the chest was performed during intravenous contrast administration. CONTRAST:  38m ISOVUE-300 IOPAMIDOL (ISOVUE-300) INJECTION 61% COMPARISON:  Chest radiographs dated 05/12/2017 FINDINGS: Cardiovascular: Heart is normal in size.  No pericardial effusion. No evidence of thoracic aortic aneurysm. Mediastinum/Nodes: Small mediastinal lymph nodes which do not meet pathologic CT size criteria. Visualized thyroid is unremarkable. Lungs/Pleura: Lungs are essentially clear. Minimal dependent atelectasis in the bilateral upper and lower lobes. No suspicious pulmonary nodules. No focal consolidation. No pleural effusion or pneumothorax. Upper Abdomen: Visualized upper abdomen is unremarkable. Musculoskeletal: Degenerative changes of the visualized thoracolumbar spine. IMPRESSION: Negative CT chest. Electronically Signed   By: SJulian HyM.D.   On: 05/12/2017 16:13   Mr BJeri CosWWNContrast  Result Date: 05/11/2017 CLINICAL DATA:  Persistent headaches.  No known injury. EXAM: MRI HEAD WITHOUT AND WITH CONTRAST TECHNIQUE: Multiplanar, multiecho pulse sequences of the brain and surrounding structures were obtained without and with intravenous contrast. CONTRAST:  261mMULTIHANCE GADOBENATE DIMEGLUMINE 529 MG/ML IV SOLN COMPARISON:  CT head without contrast 09/06/2016 FINDINGS: Brain: Vascular enhancement is noted throughout prominent dilated perivascular spaces, particularly over the frontal lobe  convexity bilaterally. There is no confluent enhancement. No discrete mass lesion is present. There is a T2 signal change are most notable within the right lentiform nucleus and thalamus bilaterally, left greater than right. T2 signal changes extend into the brainstem. Left greater than right periventricular T2 changes corresponds with the areas of enhancement. Internal auditory canals are within normal limits bilaterally. There is no significant dural enhancement. Vascular: Flow is present in the major intracranial arteries. Skull and upper cervical spine: The skullbase is within normal limits. The craniocervical junction is normal. Sinuses/Orbits: Mild mucosal thickening is present in the maxillary sinuses, right greater than left. The globes and orbits are within normal limits. IMPRESSION: 1. Extensive enhancement within dilated perivascular spaces. 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 Recommend lumbar puncture and CSF analysis. 2. T2 signal changes in the basal ganglia and left greater than right frontal lobe white matter. These results were called by telephone at the time of interpretation on 05/11/2017 at 10:49 am to Dr. SCSallee Lange who verbally acknowledged these  results. Electronically Signed   By: San Morelle M.D.   On: 05/11/2017 10:49   Mr Cervical Spine W Wo Contrast  Result Date: 05/13/2017 CLINICAL DATA:  Headaches and diffuse back pain. Possible infectious encephalitis on brain MRI. EXAM: MRI CERVICAL, THORACIC AND LUMBAR SPINE WITHOUT AND WITH CONTRAST TECHNIQUE: Multiplanar and multiecho pulse sequences of the cervical spine, to include the craniocervical junction and cervicothoracic junction, and thoracic and lumbar spine, were obtained without and with intravenous contrast. CONTRAST:  18 mL MultiHance COMPARISON:  Brain MRI 05/11/2017. Soft tissue neck CT 12/30/2008. Chest CT 05/12/2017. CT abdomen and pelvis 07/17/2014. FINDINGS: Images are intermittently  mildly to moderately motion degraded throughout examination. MRI CERVICAL SPINE FINDINGS Alignment: Cervical spine straightening.  No listhesis. Vertebrae: No fracture, suspicious osseous lesion, or significant marrow edema. Cord: Normal cord signal and morphology. No abnormal intradural enhancement. Posterior Fossa, vertebral arteries, paraspinal tissues: Partially visualized leptomeningeal enhancement in the cerebellum and possibly along the surface of the ventral brainstem. Disc levels: Minimal multilevel uncovertebral spurring without evidence of compressive neural foraminal stenosis. Widely patent spinal canal. MRI THORACIC SPINE FINDINGS Alignment:  Normal. Vertebrae: No fracture or suspicious osseous lesion. Mild degenerative endplate changes in the mid and lower thoracic spine including minimal degenerative edema. Cord: Normal cord signal and morphology. No abnormal intradural enhancement. Paraspinal and other soft tissues: Trace bilateral pleural effusions. Disc levels: Shallow left paracentral to left foraminal disc protrusion at T10-11 without significant stenosis. Minimal spondylosis elsewhere. Focally prominent right facet arthrosis at T8-9. No compressive neural foraminal stenosis. MRI LUMBAR SPINE FINDINGS Segmentation:  Standard. Alignment:  Normal. Vertebrae: No fracture or suspicious osseous lesion. Minimal degenerative endplate edema at V2-Z3. Conus medullaris and cauda equina: Conus extends to the L1 level. Conus and cauda equina appear normal. No abnormal intradural enhancement. Paraspinal and other soft tissues: Unremarkable. Disc levels: L1-2:  Minimal spondylosis.  No disc herniation or stenosis. L2-3:  Negative. L3-4: Mild disc desiccation and disc space narrowing. Minimal disc bulging without stenosis. L4-5:  Negative. L5-S1: Disc desiccation. Shallow central disc protrusion with annular fissure and minimal disc bulging without significant stenosis. IMPRESSION: 1. Normal appearance of the  spinal cord. 2. 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 3. Minimal spondylosis throughout the spine. No evidence of neural impingement. Electronically Signed   By: Logan Bores M.D.   On: 05/13/2017 14:02   Mr Thoracic Spine W Wo Contrast  Result Date: 05/13/2017 CLINICAL DATA:  Headaches and diffuse back pain. Possible infectious encephalitis on brain MRI. EXAM: MRI CERVICAL, THORACIC AND LUMBAR SPINE WITHOUT AND WITH CONTRAST TECHNIQUE: Multiplanar and multiecho pulse sequences of the cervical spine, to include the craniocervical junction and cervicothoracic junction, and thoracic and lumbar spine, were obtained without and with intravenous contrast. CONTRAST:  18 mL MultiHance COMPARISON:  Brain MRI 05/11/2017. Soft tissue neck CT 12/30/2008. Chest CT 05/12/2017. CT abdomen and pelvis 07/17/2014. FINDINGS: Images are intermittently mildly to moderately motion degraded throughout examination. MRI CERVICAL SPINE FINDINGS Alignment: Cervical spine straightening.  No listhesis. Vertebrae: No fracture, suspicious osseous lesion, or significant marrow edema. Cord: Normal cord signal and morphology. No abnormal intradural enhancement. Posterior Fossa, vertebral arteries, paraspinal tissues: Partially visualized leptomeningeal enhancement in the cerebellum and possibly along the surface of the ventral brainstem. Disc levels: Minimal multilevel uncovertebral spurring without evidence of compressive neural foraminal stenosis. Widely patent spinal canal. MRI THORACIC SPINE FINDINGS Alignment:  Normal. Vertebrae: No fracture or suspicious osseous lesion.  Mild degenerative endplate changes in the mid and lower thoracic spine including minimal degenerative edema. Cord: Normal cord signal and morphology. No abnormal intradural enhancement. Paraspinal and other soft tissues: Trace bilateral pleural effusions. Disc levels: Shallow left paracentral  to left foraminal disc protrusion at T10-11 without significant stenosis. Minimal spondylosis elsewhere. Focally prominent right facet arthrosis at T8-9. No compressive neural foraminal stenosis. MRI LUMBAR SPINE FINDINGS Segmentation:  Standard. Alignment:  Normal. Vertebrae: No fracture or suspicious osseous lesion. Minimal degenerative endplate edema at E8-B1. Conus medullaris and cauda equina: Conus extends to the L1 level. Conus and cauda equina appear normal. No abnormal intradural enhancement. Paraspinal and other soft tissues: Unremarkable. Disc levels: L1-2:  Minimal spondylosis.  No disc herniation or stenosis. L2-3:  Negative. L3-4: Mild disc desiccation and disc space narrowing. Minimal disc bulging without stenosis. L4-5:  Negative. L5-S1: Disc desiccation. Shallow central disc protrusion with annular fissure and minimal disc bulging without significant stenosis. IMPRESSION: 1. Normal appearance of the spinal cord. 2. 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 3. Minimal spondylosis throughout the spine. No evidence of neural impingement. Electronically Signed   By: Logan Bores M.D.   On: 05/13/2017 14:02   Mr Lumbar Spine W Wo Contrast  Result Date: 05/13/2017 CLINICAL DATA:  Headaches and diffuse back pain. Possible infectious encephalitis on brain MRI. EXAM: MRI CERVICAL, THORACIC AND LUMBAR SPINE WITHOUT AND WITH CONTRAST TECHNIQUE: Multiplanar and multiecho pulse sequences of the cervical spine, to include the craniocervical junction and cervicothoracic junction, and thoracic and lumbar spine, were obtained without and with intravenous contrast. CONTRAST:  18 mL MultiHance COMPARISON:  Brain MRI 05/11/2017. Soft tissue neck CT 12/30/2008. Chest CT 05/12/2017. CT abdomen and pelvis 07/17/2014. FINDINGS: Images are intermittently mildly to moderately motion degraded throughout examination. MRI CERVICAL SPINE FINDINGS  Alignment: Cervical spine straightening.  No listhesis. Vertebrae: No fracture, suspicious osseous lesion, or significant marrow edema. Cord: Normal cord signal and morphology. No abnormal intradural enhancement. Posterior Fossa, vertebral arteries, paraspinal tissues: Partially visualized leptomeningeal enhancement in the cerebellum and possibly along the surface of the ventral brainstem. Disc levels: Minimal multilevel uncovertebral spurring without evidence of compressive neural foraminal stenosis. Widely patent spinal canal. MRI THORACIC SPINE FINDINGS Alignment:  Normal. Vertebrae: No fracture or suspicious osseous lesion. Mild degenerative endplate changes in the mid and lower thoracic spine including minimal degenerative edema. Cord: Normal cord signal and morphology. No abnormal intradural enhancement. Paraspinal and other soft tissues: Trace bilateral pleural effusions. Disc levels: Shallow left paracentral to left foraminal disc protrusion at T10-11 without significant stenosis. Minimal spondylosis elsewhere. Focally prominent right facet arthrosis at T8-9. No compressive neural foraminal stenosis. MRI LUMBAR SPINE FINDINGS Segmentation:  Standard. Alignment:  Normal. Vertebrae: No fracture or suspicious osseous lesion. Minimal degenerative endplate edema at D1-V6. Conus medullaris and cauda equina: Conus extends to the L1 level. Conus and cauda equina appear normal. No abnormal intradural enhancement. Paraspinal and other soft tissues: Unremarkable. Disc levels: L1-2:  Minimal spondylosis.  No disc herniation or stenosis. L2-3:  Negative. L3-4: Mild disc desiccation and disc space narrowing. Minimal disc bulging without stenosis. L4-5:  Negative. L5-S1: Disc desiccation. Shallow central disc protrusion with annular fissure and minimal disc bulging without significant stenosis. IMPRESSION: 1. Normal appearance of the spinal cord. 2. 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 3. Minimal spondylosis throughout the spine. No evidence of neural impingement.  Electronically Signed   By: Logan Bores M.D.   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 History of a supracervical hysterectomy. EXAM: CT ABDOMEN AND PELVIS WITH CONTRAST TECHNIQUE: Multidetector CT imaging of the abdomen and pelvis was performed using the standard protocol following bolus administration of intravenous contrast. CONTRAST:  1104m ISOVUE-300 IOPAMIDOL (ISOVUE-300) INJECTION 61% COMPARISON:  07/17/2014 CT FINDINGS: Lower chest: Top-normal size heart without pericardial effusion. 10 x 10 x 6 mm opacity in the right lower lobe, nonspecific in appearance, and not apparent on prior comparison exam. No effusion or pneumothorax. Hepatobiliary: Is homogeneous enhancement of the liver without biliary dilatation or mass. Nondistended gallbladder. Pancreas: No pancreatic inflammation, enhancing mass or ductal dilatation. Spleen: Normal size spleen without mass. Adrenals/Urinary Tract: Normal bilateral adrenal glands. Symmetric enhancement of both kidneys without obstructive uropathy. No solid enhancing lesions. Nondistended urinary bladder which may account for the moderate thickened appearance of the bladder wall up to 9 mm in thickness. No calculus. Stomach/Bowel: Nondistended stomach. Normal small bowel rotation without mass or obstruction. Moderate fecal residue within the colon without obstruction. No inflammation is identified. Normal-appearing appendix. Vascular/Lymphatic: Mildly enlarged iliac chain lymph nodes up to 13 mm short axis on the right, slightly increased from prior which measured 10 mm in maximum short axis. Largest lymph node on the left is 11 mm short axis, stable in appearance. Mild aortoiliac  atherosclerosis without aneurysm. Reproductive: Supracervical hysterectomy with cervix likely accounting for stable soft tissue density in the lower pelvis. Stable appearance of the adnexa with elongated appearance of the left ovary containing a small cyst that measures 17 x 7 mm. The right ovary is quiescent appearance without focal mass. Idiopathic benign-appearing calcifications noted however within. Other: No free air free fluid. Musculoskeletal: No significant osseous findings. IMPRESSION: 1. 10 x 10 x 6 mm right lower lobe pulmonary opacity, nonspecific in appearance. Consider one of the following in 3 months for both low-risk and high-risk individuals: (a) repeat chest CT, (b) follow-up PET-CT, or (c) tissue sampling. This recommendation follows the consensus statement: Guidelines for Management of Incidental Pulmonary Nodules Detected on CT Images: From the Fleischner Society 2017; Radiology 2017; 284:228-243. 2. Mildly enlarged pelvic lymph nodes, the largest along the right iliac chain measuring 13 mm short axis both from 10 mm previously. Electronically Signed   By: DAshley RoyaltyM.D.   On: 05/14/2017 03:21   Dg Fluoro Guide Lumbar Puncture  Result Date: 05/15/2017 CLINICAL DATA:  Headaches and vision changes for the past 2 weeks. Additional CSF fluid needed for cytology. Failed bedside lumbar puncture yesterday. EXAM: DIAGNOSTIC LUMBAR PUNCTURE UNDER FLUOROSCOPIC GUIDANCE FLUOROSCOPY TIME:  Fluoroscopy Time:  12 seconds. Radiation Exposure Index (if provided by the fluoroscopic device): 1.1 mGy. Number of Acquired Spot Images: 0 PROCEDURE: Informed consent was obtained from the patient prior to the procedure, including potential complications of headache, allergy, and pain. With the patient prone, the lower back was prepped with Betadine. 1% Lidocaine was used for local anesthesia. Lumbar puncture was performed at the L4-L5 level using a 3.5 inch 20 gauge needle with return of clear, colorless CSF with  an opening pressure of 12 cm water. 13 ml of CSF were obtained for laboratory studies. The patient tolerated the procedure well and there were no apparent complications. IMPRESSION: Technically successful fluoroscopically guided lumbar puncture. Electronically Signed   By: WHuntley Dec  Derry M.D.   On: 05/15/2017 10:13   Dg Lumbar Puncture Fluoro Guide  Result Date: 05/11/2017 CLINICAL DATA:  Two week history of headache and vision changes. Palpation MRI today showed changes concerning for possible meningitis. Unsuccessful attempts at lumbar puncture in the ED tonight. EXAM: DIAGNOSTIC LUMBAR PUNCTURE UNDER FLUOROSCOPIC GUIDANCE FLUOROSCOPY TIME:  Fluoroscopy Time:  1 minutes 12 seconds Radiation Exposure Index (if provided by the fluoroscopic device): Dose area product is 229 micro Gy meter squared. Reference air car month 21.4 mGy. Number of Acquired Spot Images: 1 PROCEDURE: Informed consent was obtained from the patient prior to the procedure, including potential complications of headache, allergy, and pain. With the patient prone, the lower back was prepped with Betadine. 1% Lidocaine was used for local anesthesia. Lumbar puncture was performed at the L2-3 level using a 20 gauge needle with return of clear colorless CSF. 8 ml of CSF were obtained for laboratory studies. The patient tolerated the procedure well and there were no apparent complications. IMPRESSION: Lumbar puncture performed under fluoroscopic guidance without complication. 8 mL clear colorless CSF was obtained for laboratory evaluation. Electronically Signed   By: Lucienne Capers M.D.   On: 05/11/2017 21:33    Lab Data:  CBC: Recent Labs  Lab 05/11/17 1615 05/12/17 0354 05/13/17 0317 05/14/17 0506 05/15/17 0434  WBC 4.3 3.5* 3.5* 4.1 4.1  NEUTROABS 1.9 2.0  --   --   --   HGB 14.3 12.6 12.6 13.3 14.1  HCT 40.9 37.3 36.8 38.9 40.3  MCV 88.3 88.8 88.5 88.4 89.4  PLT 193 173 182 194 791   Basic Metabolic Panel: Recent Labs    Lab 05/11/17 1615 05/12/17 0354 05/13/17 0317 05/14/17 0506 05/15/17 0434  NA 138 136 132* 137 134*  K 3.7 3.5 3.7 4.0 3.9  CL 104 101 103 104 103  CO2 '23 25 23 25 23  '$ GLUCOSE 106* 154* 108* 112* 107*  BUN 5* '8 9 7 7  '$ CREATININE 0.70 0.79 0.63 0.70 0.63  CALCIUM 9.7 9.2 8.9 9.4 9.3   GFR: Estimated Creatinine Clearance: 89.7 mL/min (by C-G formula based on SCr of 0.63 mg/dL). Liver Function Tests: Recent Labs  Lab 05/11/17 1615 05/12/17 0354  AST 27 22  ALT 20 19  ALKPHOS 59 52  BILITOT 0.9 0.9  PROT 7.4 6.8  ALBUMIN 4.4 3.6   No results for input(s): LIPASE, AMYLASE in the last 168 hours. No results for input(s): AMMONIA in the last 168 hours. Coagulation Profile: No results for input(s): INR, PROTIME in the last 168 hours. Cardiac Enzymes: No results for input(s): CKTOTAL, CKMB, CKMBINDEX, TROPONINI in the last 168 hours. BNP (last 3 results) No results for input(s): PROBNP in the last 8760 hours. HbA1C: No results for input(s): HGBA1C in the last 72 hours. CBG: Recent Labs  Lab 05/14/17 1127 05/14/17 1648 05/14/17 2139 05/15/17 0607 05/15/17 1135  GLUCAP 92 113* 86 111* 78   Lipid Profile: No results for input(s): CHOL, HDL, LDLCALC, TRIG, CHOLHDL, LDLDIRECT in the last 72 hours. Thyroid Function Tests: No results for input(s): TSH, T4TOTAL, FREET4, T3FREE, THYROIDAB in the last 72 hours. Anemia Panel: No results for input(s): VITAMINB12, FOLATE, FERRITIN, TIBC, IRON, RETICCTPCT in the last 72 hours. Urine analysis:    Component Value Date/Time   COLORURINE YELLOW 09/06/2016 [place] 09/06/2016 0756   LABSPEC 1.015 09/06/2016 0756   PHURINE 7.0 09/06/2016 [place] 09/06/2016 0756   HGBUR NEGATIVE 09/06/2016 0756  BILIRUBINUR NEGATIVE 09/06/2016 0756   BILIRUBINUR ++ 01/27/2015 [place] 09/06/2016 0756   PROTEINUR NEGATIVE 09/06/2016 0756   UROBILINOGEN 1.0 07/17/2014 1435   NITRITE NEGATIVE  09/06/2016 0756   LEUKOCYTESUR NEGATIVE 09/06/2016 0756     Ripudeep Rai M.D. [facility] 05/15/2017, 1:34 PM  Pager: [phone] Between 7am to 7pm - call Pager - [phone]  After 7pm go to [email or web address] - password [email or web address]  Call night coverage person covering after 7pm

## 2017-05-15 NOTE — Progress Notes (Signed)
Attempted to see however patient currently is getting her lumbar puncture under interventional radiology.  Etta Quill PA-C [facility] [phone]  M-F  (8:30 am- 4 PM)  05/15/2017, 9:42 AM

## 2017-05-15 NOTE — Progress Notes (Signed)
Subjective: Had bad headache last night, but better today.  Still present  Exam: Vitals:   05/15/17 1019 05/15/17 1900  BP: 139/72 122/66  Pulse: 77 75  Resp: 18   Temp: 98.7 F (37.1 C) 98.5 F (36.9 C)  SpO2: 98% 98%   Gen: In bed, NAD Resp: non-labored breathing, no acute distress Abd: soft, nt  Neuro: MS: Awake, alert, interactive and appropriate CN: Visual fields full Motor: Moves all extremities well  Pertinent Labs: Studies: CSF WBC - 26(lymphs) CSF RBC - 4 CSF protein - 69 CSF glucose 51  On CSF:  Crypto Ag- negative  HSV - negative  EBV- negative  VZV-pending  Lyme Ab- pending  VDRL CSF - negative  CSF Culture - negative  HIV - negative  ESR 3 CRP - < 0.8 Anti-Jo1 - neg ENA(incldues RP, SSA, SSB, Scl-70, ENA, dsDNA) - negative Oligoclonal bands - pending   ACE, CSF- pending ACE, serum <15   Impression: 52 year old female with new onset headaches and abnormal enhancement in the perivascular spaces. The differential remains wide, but thus far labs have been relatively reassuring. LP for cytology performed this AM. 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 A short course of steriods(e.g. Medrol dose pack) may be helpful symptomatically. First, however, I would favor treating with compazine/benadryl/toradol to see if this provides symptomatic relief.   Recommendations: 1) compazine/benadryl/toradol 2) Await cytology.   Roland Rack, MD [facility] [phone]  If 7pm- 7am, please page neurology on call as listed in [place].

## 2017-05-15 NOTE — Procedures (Signed)
Technically successful fluoroscopically-guided lumbar puncture. Opening pressure of 12 cm water. 13 cc clear, colorless CSF removed and sent to lab and cytology for analysis. Patient tolerated procedure well without immediate complication.  Flat bedrest with bathroom privileges for 24 hours.

## 2017-05-16 ENCOUNTER — Telehealth: Payer: Self-pay | Admitting: Family Medicine

## 2017-05-16 DIAGNOSIS — G030 Nonpyogenic meningitis: Principal | ICD-10-CM

## 2017-05-16 LAB — CBC
HCT: 40.4 % (ref 36.0–46.0)
Hemoglobin: 14 g/dL (ref 12.0–15.0)
MCH: 30.8 pg (ref 26.0–34.0)
MCHC: 34.7 g/dL (ref 30.0–36.0)
MCV: 89 fL (ref 78.0–100.0)
Platelets: 190 10*3/uL (ref 150–400)
RBC: 4.54 MIL/uL (ref 3.87–5.11)
RDW: 12.4 % (ref 11.5–15.5)
WBC: 3.7 10*3/uL — ABNORMAL LOW (ref 4.0–10.5)

## 2017-05-16 LAB — BASIC METABOLIC PANEL
Anion gap: 8 (ref 5–15)
BUN: 12 mg/dL (ref 6–20)
CO2: 23 mmol/L (ref 22–32)
Calcium: 9.3 mg/dL (ref 8.9–10.3)
Chloride: 106 mmol/L (ref 101–111)
Creatinine, Ser: 0.69 mg/dL (ref 0.44–1.00)
GFR calc Af Amer: >60 mL/min (ref >60)
GFR calc non Af Amer: >60 mL/min (ref >60)
Glucose, Bld: 94 mg/dL (ref 65–99)
Potassium: 4 mmol/L (ref 3.5–5.1)
Sodium: 137 mmol/L (ref 135–145)

## 2017-05-16 LAB — GLUCOSE, CAPILLARY
Glucose-Capillary: 132 mg/dL — ABNORMAL HIGH (ref 65–99)
Glucose-Capillary: 88 mg/dL (ref 65–99)

## 2017-05-16 MED ORDER — KETOCONAZOLE 2 % EX CREA: 1.0000 "application " | TOPICAL_CREAM | CUTANEOUS | Status: DC

## 2017-05-16 MED ORDER — METFORMIN HCL 500 MG PO TABS: 1000.0000 mg | ORAL_TABLET | Freq: Every day | ORAL | Status: DC

## 2017-05-16 MED ORDER — CETIRIZINE HCL 10 MG PO TABS: 10.0000 mg | ORAL_TABLET | Freq: Every day | ORAL | Status: DC | PRN

## 2017-05-16 MED ORDER — CITALOPRAM HYDROBROMIDE 20 MG PO TABS: 20.0000 mg | ORAL_TABLET | Freq: Every day | ORAL | Status: DC

## 2017-05-16 MED ORDER — TRIAMCINOLONE ACETONIDE 0.1 % EX CREA: 1.0000 "application " | TOPICAL_CREAM | CUTANEOUS | Status: DC

## 2017-05-16 MED ORDER — FLUTICASONE PROPIONATE 50 MCG/ACT NA SUSP: 2.0000 | Freq: Every day | NASAL | Status: DC | PRN

## 2017-05-16 MED ORDER — PANTOPRAZOLE SODIUM 40 MG PO TBEC: 40.0000 mg | DELAYED_RELEASE_TABLET | Freq: Every day | ORAL | Status: DC | PRN

## 2017-05-16 MED ORDER — OMEGA-3-ACID ETHYL ESTERS 1 G PO CAPS: 1.0000 | ORAL_CAPSULE | Freq: Every day | ORAL | Status: DC

## 2017-05-16 NOTE — Discharge Summary (Signed)
Physician Discharge Summary  [name]:[id number] DOB: 1965/08/25  PCP: Kathyrn Drown, MD  Admit date: 05/11/2017 Discharge date: 05/16/2017  Recommendations for Outpatient Follow-up:  1. Dr. Sallee Lange, PCP in one week with repeat labs (CBC). Please follow outstanding results as outpatient. 2. [facility] in 2-4 weeks. Ambulatory referral has been sent.  Home Health: None Equipment/Devices: None    Discharge Condition: Improved and stable  CODE STATUS: Full  Diet recommendation: Heart healthy & diabetic diet.  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 Patient had been having headaches for the last 2-1/2 weeks which is globally with throbbing type at times 10 out of 10. Last few days patient also had been having some left blurred vision. Around 6 days ago patient also had transient difficulty hearing from left ear which resolved. Denied any weakness of upper or lower extremity. Despite taking pain relief medications patient headache was still persistent. 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 -Neurology was consulted, underwent LP.  CSF showed protein 69, WBCs 26 predominantly lymphocytic and low glucose 51. -Cryptococcal antigen  negative, LFTs normal, CSF cultures 05/11/17 negative.  No pressure reading was done.  ESR 3 -Workup in progress to rule out neurosarcoidosis, Behcet's disease, vasculitis. Rheumatological workup/labs negative so far except vasculitis. Outstanding lab results can be followed during outpatient PCP/neurology visit. -Chest x-ray negative, CT chest negative.  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  Minimal spondylosis throughout the spine. -CT abdomen pelvis showed a 10 x10 x 6 millimeters right lower lobe pulmonary opacity, nonspecific, recommended repeat chest CT in 3 months or PET/CT or tissue sampling.  Mildly enlarged pelvic lymph nodes.  -Spinal tap repeated 1/9, opening pressure of 12 cm CSF study shows increased WBCs 48, lymphocytic 92, protein elevated 93, follow neurology recommendations - As per neurology follow-up 1/9, new onset headaches and abnormal enhancement in the perivascular spaces for which the differential remains wide but thus far labs have been relatively reassuring. With subjective fevers, URI type symptoms at onset, neurology felt that viral/post viral aseptic meningitis was the most likely current explanation. - Her headache resolved after Compazine/Benadryl/Toradol cocktail yesterday. No recurrence of headache. - I called and discussed with pathologist who reviewed CSF smear and indicated predominantly lymphocytes in keeping with possible aseptic meningitis and no other concerning findings. - I discussed with Dr. Leonel Ramsay who recommended discharging home, when necessary NSAIDs for headache but if continues to have symptoms in a week or so, could consider Medrol Dosepak, outpatient follow-up with Neurology with repeat imaging to ensure improvement as outpatient (patient wishes to follow-up with Neurology in  [place]) and return to work next week. - Discussed in detail with patient who verbalized understanding.  - ACE  level normal. HIV screen nonreactive. Anti-Jo-1 negative. ENA negative. EBV PCR negative, VZV PCR pending, B Burgdorfi antibodies pending, cryptococcal antigen negative. Oligoclonal bands in CSF: 0. CSF HSV culture: Negative. CSF VDRL: Nonreactive. CSF HSV 1 and 2 DNA by PCR: Negative. Fungal culture pending.  Active Problems:   Essential hypertension - continue amlodipine, lisinopril . Controlled.    Diabetes mellitus type II, controlled ([place]) - Treated in the hospital with SSI. Good inpatient control. Resume prior home medications at discharge.  Chronic pain syndrome/chronic back pain - Patient states that she is no longer taking opioids for pain but does state that she takes Xanax.  Constipation OTC bowel regimen as needed.  Right lower lobe pulmonary opacity - And pelvic lymphadenopathy seen on CT results as below. Outpatient follow up recommendations as outlined by the radiologist or as deemed necessary.     Consultations:  Neurology  Procedures:  Spinal tap 1/4 and 1/8   Discharge Instructions  Discharge Instructions    Ambulatory referral to Neurology   Complete by:  As directed    An appointment is requested in approximately: 2 weeks   Call MD for:  extreme fatigue   Complete by:  As directed    Call MD for:  persistant dizziness or light-headedness   Complete by:  As directed    Call MD for:  severe uncontrolled pain   Complete by:  As directed    Call MD for:  temperature >100.4   Complete by:  As directed    Diet - low sodium heart healthy   Complete by:  As directed    Diet Carb Modified   Complete by:  As directed    Increase activity slowly   Complete by:  As directed        Medication List    STOP taking these medications   HYDROcodone-acetaminophen 5-325 MG tablet Commonly known as:  NORCO/VICODIN   meclizine 25 MG  tablet Commonly known as:  ANTIVERT     TAKE these medications   acetaminophen 500 MG tablet Commonly known as:  TYLENOL Take 500 mg by mouth every 8 (eight) hours as needed for mild pain. Reported on 07/12/2015   ALPRAZolam 0.5 MG tablet Commonly known as:  XANAX TAKE 1 TABLET BY MOUTH AT BEDTIME AS NEEDED FOR SLEEP   amLODipine 10 MG tablet Commonly known as:  NORVASC Take 1 tablet (10 mg total) by mouth daily.   cetirizine 10 MG tablet Commonly known as:  ZYRTEC Take 1 tablet (10 mg total) by mouth daily as needed for allergies.   cholecalciferol 1000 units tablet Commonly known as:  VITAMIN D Take 1,000 Units by mouth daily.   citalopram 20 MG tablet Commonly known as:  CELEXA Take 1 tablet (20 mg total) by mouth daily.   fexofenadine 180 MG tablet Commonly known as:  ALLEGRA Take 1 tablet (180 mg total) by mouth daily.   fluticasone 50 MCG/ACT nasal spray Commonly known as:  FLONASE Place 2 sprays into both nostrils daily as needed for allergies.   ketoconazole 2 % cream Commonly known as:  NIZORAL Apply 1 application topically 3 (three) times a week.   lisinopril-hydrochlorothiazide 20-12.5 MG tablet Commonly known as:  PRINZIDE,ZESTORETIC TAKE 1 TABLET BY MOUTH DAILY FOR FOR BLOOD PRESSURE   metFORMIN 500 MG tablet Commonly known as:  GLUCOPHAGE Take 2 tablets (1,000 mg total) by mouth daily with breakfast.   naproxen sodium 220 MG tablet Commonly known as:  ALEVE Take 220-440 mg by  mouth daily as needed (for pain).   omega-3 acid ethyl esters 1 g capsule Commonly known as:  LOVAZA Take 1 capsule (1 g total) by mouth daily.   pantoprazole 40 MG tablet Commonly known as:  PROTONIX Take 1 tablet (40 mg total) by mouth daily as needed (acid reflux).   PEN NEEDLES 31GX5/16" 31G X 8 MM Misc Use daily as directed   pravastatin 40 MG tablet Commonly known as:  PRAVACHOL Take 1 tablet (40 mg total) by mouth daily.   triamcinolone cream 0.1 % Commonly  known as:  KENALOG Apply 1 application topically 3 (three) times a week.   VICTOZA 18 MG/3ML Sopn Generic drug:  liraglutide INJECT 1.2 MG SUBQ ONCE DAILY      Follow-up Information    Luking, Elayne Snare, MD. Schedule an appointment as soon as possible for a visit in 1 week(s).   Specialty:  Family Medicine Why:  To be seen with repeat labs (CBC). Contact information: [place] [address] [phone]        [facility]. Schedule an appointment as soon as possible for a visit in 2 week(s).   Contact information: [address] [place] [address]  Phone number: [phone]         No Known Allergies    Procedures/Studies: Ct Angio Head W Or Wo Contrast  Result Date: 05/14/2017 CLINICAL DATA:  52 y/o  F; CNS vasculitis. EXAM: CT ANGIOGRAPHY HEAD AND NECK TECHNIQUE: Multidetector CT imaging of the head and neck was performed using the standard protocol during bolus administration of intravenous contrast. Multiplanar CT image reconstructions and MIPs were obtained to evaluate the vascular anatomy. Carotid stenosis measurements (when applicable) are obtained utilizing NASCET criteria, using the distal internal carotid diameter as the denominator. CONTRAST:  74m ISOVUE-370 IOPAMIDOL (ISOVUE-370) INJECTION 76% COMPARISON:  05/11/2017 MRI of the head FINDINGS: CT HEAD FINDINGS Brain: No evidence of acute infarction, hemorrhage, hydrocephalus, extra-axial collection or mass lesion/mass effect. Vascular: As below. Skull: Normal. Negative for fracture or focal lesion. Sinuses: Imaged portions are clear. Orbits: No acute finding. Review of the MIP images confirms the above findings CTA NECK FINDINGS Aortic arch: Standard branching. Imaged portion shows no evidence of aneurysm or dissection. No significant stenosis of the major arch vessel origins. Right carotid system: No evidence of dissection, stenosis (50% or greater) or occlusion.  Left carotid system: No evidence of dissection, stenosis (50% or greater) or occlusion. Vertebral arteries: Codominant. No evidence of dissection, stenosis (50% or greater) or occlusion. Skeleton: Negative. Other neck: Negative. Upper chest: Negative. Review of the MIP images confirms the above findings CTA HEAD FINDINGS Anterior circulation: No significant stenosis, proximal occlusion, aneurysm, or vascular malformation. Mild calcific atherosclerosis of carotid siphons. Posterior circulation: No significant stenosis, proximal occlusion, aneurysm, or vascular malformation. Venous sinuses: As permitted by contrast timing, patent. Anatomic variants: Anterior and bilateral posterior communicating arteries identified. Delayed phase: Small foci of enhancement on prior MRI are not appreciable on CT. Review of the MIP images confirms the above findings IMPRESSION: CT head: 1. No acute intracranial abnormality identified. 2. Tiny foci of enhancement on the prior MRI of the brain are not appreciated on CT. CTA neck: Patent carotid and vertebral arteries. No dissection, aneurysm, or significant stenosis by NASCET criteria. CTA head: Patent anterior and posterior intracranial circulation. No large vessel occlusion, aneurysm, or significant stenosis. No specific findings for vasculitis. Electronically Signed   By: LKristine GarbeM.D.   On:  05/14/2017 23:40   Dg Chest 2 View  Result Date: 05/12/2017 CLINICAL DATA:  Headache today. EXAM: CHEST  2 VIEW COMPARISON:  Radiograph 09/17/2008 FINDINGS: The cardiomediastinal contours are normal. The lungs are clear. Pulmonary vasculature is normal. No consolidation, pleural effusion, or pneumothorax. No acute osseous abnormalities are seen. IMPRESSION: No acute pulmonary process. Electronically Signed   By: Jeb Levering M.D.   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 EXAM: CT ANGIOGRAPHY  HEAD AND NECK TECHNIQUE: Multidetector CT imaging of the head and neck was performed using the standard protocol during bolus administration of intravenous contrast. Multiplanar CT image reconstructions and MIPs were obtained to evaluate the vascular anatomy. Carotid stenosis measurements (when applicable) are obtained utilizing NASCET criteria, using the distal internal carotid diameter as the denominator. CONTRAST:  77m ISOVUE-370 IOPAMIDOL (ISOVUE-370) INJECTION 76% COMPARISON:  05/11/2017 MRI of the head FINDINGS: CT HEAD FINDINGS Brain: No evidence of acute infarction, hemorrhage, hydrocephalus, extra-axial collection or mass lesion/mass effect. Vascular: As below. Skull: Normal. Negative for fracture or focal lesion. Sinuses: Imaged portions are clear. Orbits: No acute finding. Review of the MIP images confirms the above findings CTA NECK FINDINGS Aortic arch: Standard branching. Imaged portion shows no evidence of aneurysm or dissection. No significant stenosis of the major arch vessel origins. Right carotid system: No evidence of dissection, stenosis (50% or greater) or occlusion. Left carotid system: No evidence of dissection, stenosis (50% or greater) or occlusion. Vertebral arteries: Codominant. No evidence of dissection, stenosis (50% or greater) or occlusion. Skeleton: Negative. Other neck: Negative. Upper chest: Negative. Review of the MIP images confirms the above findings CTA HEAD FINDINGS Anterior circulation: No significant stenosis, proximal occlusion, aneurysm, or vascular malformation. Mild calcific atherosclerosis of carotid siphons. Posterior circulation: No significant stenosis, proximal occlusion, aneurysm, or vascular malformation. Venous sinuses: As permitted by contrast timing, patent. Anatomic variants: Anterior and bilateral posterior communicating arteries identified. Delayed phase: Small foci of enhancement on prior MRI are not appreciable on CT. Review of the MIP images confirms the  above findings IMPRESSION: CT head: 1. No acute intracranial abnormality identified. 2. Tiny foci of enhancement on the prior MRI of the brain are not appreciated on CT. CTA neck: Patent carotid and vertebral arteries. No dissection, aneurysm, or significant stenosis by NASCET criteria. CTA head: Patent anterior and posterior intracranial circulation. No large vessel occlusion, aneurysm, or significant stenosis. No specific findings for vasculitis. Electronically Signed   By: LKristine GarbeM.D.   On: 05/14/2017 23:40   Ct Chest W Contrast  Result Date: 05/12/2017 CLINICAL DATA:  Shortness of breath, cough, low back pain EXAM: CT CHEST WITH CONTRAST TECHNIQUE: Multidetector CT imaging of the chest was performed during intravenous contrast administration. CONTRAST:  758mISOVUE-300 IOPAMIDOL (ISOVUE-300) INJECTION 61% COMPARISON:  Chest radiographs dated 05/12/2017 FINDINGS: Cardiovascular: Heart is normal in size.  No pericardial effusion. No evidence of thoracic aortic aneurysm. Mediastinum/Nodes: Small mediastinal lymph nodes which do not meet pathologic CT size criteria. Visualized thyroid is unremarkable. Lungs/Pleura: Lungs are essentially clear. Minimal dependent atelectasis in the bilateral upper and lower lobes. No suspicious pulmonary nodules. No focal consolidation. No pleural effusion or pneumothorax. Upper Abdomen: Visualized upper abdomen is unremarkable. Musculoskeletal: Degenerative changes of the visualized thoracolumbar spine. IMPRESSION: Negative CT chest. Electronically Signed   By: SrJulian Hy.D.   On: 05/12/2017 16:13   Mr BrJeri CosoWGontrast  Result  Date: 05/11/2017 CLINICAL DATA:  Persistent headaches.  No known injury. EXAM: MRI HEAD WITHOUT AND WITH CONTRAST TECHNIQUE: Multiplanar, multiecho pulse sequences of the brain and surrounding structures were obtained without and with intravenous contrast. CONTRAST:  2m MULTIHANCE GADOBENATE DIMEGLUMINE 529 MG/ML IV SOLN  COMPARISON:  CT head without contrast 09/06/2016 FINDINGS: Brain: Vascular enhancement is noted throughout prominent dilated perivascular spaces, particularly over the frontal lobe convexity bilaterally. There is no confluent enhancement. No discrete mass lesion is present. There is a T2 signal change are most notable within the right lentiform nucleus and thalamus bilaterally, left greater than right. T2 signal changes extend into the brainstem. Left greater than right periventricular T2 changes corresponds with the areas of enhancement. Internal auditory canals are within normal limits bilaterally. There is no significant dural enhancement. Vascular: Flow is present in the major intracranial arteries. Skull and upper cervical spine: The skullbase is within normal limits. The craniocervical junction is normal. Sinuses/Orbits: Mild mucosal thickening is present in the maxillary sinuses, right greater than left. The globes and orbits are within normal limits. IMPRESSION: 1. Extensive enhancement within dilated perivascular spaces. 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 Recommend lumbar puncture and CSF analysis. 2. T2 signal changes in the basal ganglia and left greater than right frontal lobe white matter. These results were called by telephone at the time of interpretation on 05/11/2017 at 10:49 am to Dr. SSallee Lange, who verbally acknowledged these results. Electronically Signed   By: CSan MorelleM.D.   On: 05/11/2017 10:49   Mr Cervical Spine W Wo Contrast  Result Date: 05/13/2017 CLINICAL DATA:  Headaches and diffuse back pain. Possible infectious encephalitis on brain MRI. EXAM: MRI CERVICAL, THORACIC AND LUMBAR SPINE WITHOUT AND WITH CONTRAST TECHNIQUE: Multiplanar and multiecho pulse sequences of the cervical spine, to include the craniocervical junction and cervicothoracic junction, and thoracic and lumbar spine, were obtained without and with intravenous contrast.  CONTRAST:  18 mL MultiHance COMPARISON:  Brain MRI 05/11/2017. Soft tissue neck CT 12/30/2008. Chest CT 05/12/2017. CT abdomen and pelvis 07/17/2014. FINDINGS: Images are intermittently mildly to moderately motion degraded throughout examination. MRI CERVICAL SPINE FINDINGS Alignment: Cervical spine straightening.  No listhesis. Vertebrae: No fracture, suspicious osseous lesion, or significant marrow edema. Cord: Normal cord signal and morphology. No abnormal intradural enhancement. Posterior Fossa, vertebral arteries, paraspinal tissues: Partially visualized leptomeningeal enhancement in the cerebellum and possibly along the surface of the ventral brainstem. Disc levels: Minimal multilevel uncovertebral spurring without evidence of compressive neural foraminal stenosis. Widely patent spinal canal. MRI THORACIC SPINE FINDINGS Alignment:  Normal. Vertebrae: No fracture or suspicious osseous lesion. Mild degenerative endplate changes in the mid and lower thoracic spine including minimal degenerative edema. Cord: Normal cord signal and morphology. No abnormal intradural enhancement. Paraspinal and other soft tissues: Trace bilateral pleural effusions. Disc levels: Shallow left paracentral to left foraminal disc protrusion at T10-11 without significant stenosis. Minimal spondylosis elsewhere. Focally prominent right facet arthrosis at T8-9. No compressive neural foraminal stenosis. MRI LUMBAR SPINE FINDINGS Segmentation:  Standard. Alignment:  Normal. Vertebrae: No fracture or suspicious osseous lesion. Minimal degenerative endplate edema at LY0-V3 Conus medullaris and cauda equina: Conus extends to the L1 level. Conus and cauda equina appear normal. No abnormal intradural enhancement. Paraspinal and other soft tissues: Unremarkable. Disc levels: L1-2:  Minimal spondylosis.  No disc herniation or stenosis. L2-3:  Negative. L3-4: Mild disc desiccation and disc space narrowing. Minimal disc bulging without stenosis. L4-5:   Negative. L5-S1: Disc desiccation. Shallow central  disc protrusion with annular fissure and minimal disc bulging without significant stenosis. IMPRESSION: 1. Normal appearance of the spinal cord. 2. 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 3. Minimal spondylosis throughout the spine. No evidence of neural impingement. Electronically Signed   By: Logan Bores M.D.   On: 05/13/2017 14:02   Mr Thoracic Spine W Wo Contrast  Result Date: 05/13/2017 CLINICAL DATA:  Headaches and diffuse back pain. Possible infectious encephalitis on brain MRI. EXAM: MRI CERVICAL, THORACIC AND LUMBAR SPINE WITHOUT AND WITH CONTRAST TECHNIQUE: Multiplanar and multiecho pulse sequences of the cervical spine, to include the craniocervical junction and cervicothoracic junction, and thoracic and lumbar spine, were obtained without and with intravenous contrast. CONTRAST:  18 mL MultiHance COMPARISON:  Brain MRI 05/11/2017. Soft tissue neck CT 12/30/2008. Chest CT 05/12/2017. CT abdomen and pelvis 07/17/2014. FINDINGS: Images are intermittently mildly to moderately motion degraded throughout examination. MRI CERVICAL SPINE FINDINGS Alignment: Cervical spine straightening.  No listhesis. Vertebrae: No fracture, suspicious osseous lesion, or significant marrow edema. Cord: Normal cord signal and morphology. No abnormal intradural enhancement. Posterior Fossa, vertebral arteries, paraspinal tissues: Partially visualized leptomeningeal enhancement in the cerebellum and possibly along the surface of the ventral brainstem. Disc levels: Minimal multilevel uncovertebral spurring without evidence of compressive neural foraminal stenosis. Widely patent spinal canal. MRI THORACIC SPINE FINDINGS Alignment:  Normal. Vertebrae: No fracture or suspicious osseous lesion. Mild degenerative endplate changes in the mid and lower thoracic spine including minimal degenerative edema.  Cord: Normal cord signal and morphology. No abnormal intradural enhancement. Paraspinal and other soft tissues: Trace bilateral pleural effusions. Disc levels: Shallow left paracentral to left foraminal disc protrusion at T10-11 without significant stenosis. Minimal spondylosis elsewhere. Focally prominent right facet arthrosis at T8-9. No compressive neural foraminal stenosis. MRI LUMBAR SPINE FINDINGS Segmentation:  Standard. Alignment:  Normal. Vertebrae: No fracture or suspicious osseous lesion. Minimal degenerative endplate edema at N6-E9. Conus medullaris and cauda equina: Conus extends to the L1 level. Conus and cauda equina appear normal. No abnormal intradural enhancement. Paraspinal and other soft tissues: Unremarkable. Disc levels: L1-2:  Minimal spondylosis.  No disc herniation or stenosis. L2-3:  Negative. L3-4: Mild disc desiccation and disc space narrowing. Minimal disc bulging without stenosis. L4-5:  Negative. L5-S1: Disc desiccation. Shallow central disc protrusion with annular fissure and minimal disc bulging without significant stenosis. IMPRESSION: 1. Normal appearance of the spinal cord. 2. 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 3. Minimal spondylosis throughout the spine. No evidence of neural impingement. Electronically Signed   By: Logan Bores M.D.   On: 05/13/2017 14:02   Mr Lumbar Spine W Wo Contrast  Result Date: 05/13/2017 CLINICAL DATA:  Headaches and diffuse back pain. Possible infectious encephalitis on brain MRI. EXAM: MRI CERVICAL, THORACIC AND LUMBAR SPINE WITHOUT AND WITH CONTRAST TECHNIQUE: Multiplanar and multiecho pulse sequences of the cervical spine, to include the craniocervical junction and cervicothoracic junction, and thoracic and lumbar spine, were obtained without and with intravenous contrast. CONTRAST:  18 mL MultiHance COMPARISON:  Brain MRI 05/11/2017. Soft tissue neck CT  12/30/2008. Chest CT 05/12/2017. CT abdomen and pelvis 07/17/2014. FINDINGS: Images are intermittently mildly to moderately motion degraded throughout examination. MRI CERVICAL SPINE FINDINGS Alignment: Cervical spine straightening.  No listhesis. Vertebrae: No fracture, suspicious osseous lesion, or significant marrow edema. Cord: Normal cord signal and morphology. No abnormal intradural enhancement. Posterior Fossa, vertebral arteries, paraspinal  tissues: Partially visualized leptomeningeal enhancement in the cerebellum and possibly along the surface of the ventral brainstem. Disc levels: Minimal multilevel uncovertebral spurring without evidence of compressive neural foraminal stenosis. Widely patent spinal canal. MRI THORACIC SPINE FINDINGS Alignment:  Normal. Vertebrae: No fracture or suspicious osseous lesion. Mild degenerative endplate changes in the mid and lower thoracic spine including minimal degenerative edema. Cord: Normal cord signal and morphology. No abnormal intradural enhancement. Paraspinal and other soft tissues: Trace bilateral pleural effusions. Disc levels: Shallow left paracentral to left foraminal disc protrusion at T10-11 without significant stenosis. Minimal spondylosis elsewhere. Focally prominent right facet arthrosis at T8-9. No compressive neural foraminal stenosis. MRI LUMBAR SPINE FINDINGS Segmentation:  Standard. Alignment:  Normal. Vertebrae: No fracture or suspicious osseous lesion. Minimal degenerative endplate edema at Q3-F3. Conus medullaris and cauda equina: Conus extends to the L1 level. Conus and cauda equina appear normal. No abnormal intradural enhancement. Paraspinal and other soft tissues: Unremarkable. Disc levels: L1-2:  Minimal spondylosis.  No disc herniation or stenosis. L2-3:  Negative. L3-4: Mild disc desiccation and disc space narrowing. Minimal disc bulging without stenosis. L4-5:  Negative. L5-S1: Disc desiccation. Shallow central disc protrusion with annular  fissure and minimal disc bulging without significant stenosis. IMPRESSION: 1. Normal appearance of the spinal cord. 2. 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 3. Minimal spondylosis throughout the spine. No evidence of neural impingement. Electronically Signed   By: Logan Bores M.D.   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 History of a supracervical hysterectomy. EXAM: CT ABDOMEN AND PELVIS WITH CONTRAST TECHNIQUE: Multidetector CT imaging of the abdomen and pelvis was performed using the standard protocol following bolus administration of intravenous contrast. CONTRAST:  117m ISOVUE-300 IOPAMIDOL (ISOVUE-300) INJECTION 61% COMPARISON:  07/17/2014 CT FINDINGS: Lower chest: Top-normal size heart without pericardial effusion. 10 x 10 x 6 mm opacity in the right lower lobe, nonspecific in appearance, and not apparent on prior comparison exam. No effusion or pneumothorax. Hepatobiliary: Is homogeneous enhancement of the liver without biliary dilatation or mass. Nondistended gallbladder. Pancreas: No pancreatic inflammation, enhancing mass or ductal dilatation. Spleen: Normal size spleen without mass. Adrenals/Urinary Tract: Normal bilateral adrenal glands. Symmetric enhancement of both kidneys without obstructive uropathy. No solid enhancing lesions. Nondistended urinary bladder which may account for the moderate thickened appearance of the bladder wall up to 9 mm in thickness. No calculus. Stomach/Bowel: Nondistended stomach. Normal small bowel rotation without mass or obstruction. Moderate fecal residue within the colon without obstruction. No inflammation is identified. Normal-appearing appendix. Vascular/Lymphatic: Mildly enlarged iliac chain lymph nodes up to 13 mm short  axis on the right, slightly increased from prior which measured 10 mm in maximum short axis. Largest lymph node on the left is 11 mm short axis, stable in appearance. Mild aortoiliac atherosclerosis without aneurysm. Reproductive: Supracervical hysterectomy with cervix likely accounting for stable soft tissue density in the lower pelvis. Stable appearance of the adnexa with elongated appearance of the left ovary containing a small cyst that measures 17 x 7 mm. The right ovary is quiescent appearance without focal mass. Idiopathic benign-appearing calcifications noted however within. Other: No free air free fluid. Musculoskeletal: No significant osseous findings. IMPRESSION: 1. 10 x 10 x 6 mm right lower lobe pulmonary opacity, nonspecific in appearance. Consider one of the following in 3 months  for both low-risk and high-risk individuals: (a) repeat chest CT, (b) follow-up PET-CT, or (c) tissue sampling. This recommendation follows the consensus statement: Guidelines for Management of Incidental Pulmonary Nodules Detected on CT Images: From the Fleischner Society 2017; Radiology 2017; 284:228-243. 2. Mildly enlarged pelvic lymph nodes, the largest along the right iliac chain measuring 13 mm short axis both from 10 mm previously. Electronically Signed   By: Ashley Royalty M.D.   On: 05/14/2017 03:21   Dg Fluoro Guide Lumbar Puncture  Result Date: 05/15/2017 CLINICAL DATA:  Headaches and vision changes for the past 2 weeks. Additional CSF fluid needed for cytology. Failed bedside lumbar puncture yesterday. EXAM: DIAGNOSTIC LUMBAR PUNCTURE UNDER FLUOROSCOPIC GUIDANCE FLUOROSCOPY TIME:  Fluoroscopy Time:  12 seconds. Radiation Exposure Index (if provided by the fluoroscopic device): 1.1 mGy. Number of Acquired Spot Images: 0 PROCEDURE: Informed consent was obtained from the patient prior to the procedure, including potential complications of headache, allergy, and pain. With the patient prone, the lower back was prepped  with Betadine. 1% Lidocaine was used for local anesthesia. Lumbar puncture was performed at the L4-L5 level using a 3.5 inch 20 gauge needle with return of clear, colorless CSF with an opening pressure of 12 cm water. 13 ml of CSF were obtained for laboratory studies. The patient tolerated the procedure well and there were no apparent complications. IMPRESSION: Technically successful fluoroscopically guided lumbar puncture. Electronically Signed   By: Titus Dubin M.D.   On: 05/15/2017 10:13   Dg Lumbar Puncture Fluoro Guide  Result Date: 05/11/2017 CLINICAL DATA:  Two week history of headache and vision changes. Palpation MRI today showed changes concerning for possible meningitis. Unsuccessful attempts at lumbar puncture in the ED tonight. EXAM: DIAGNOSTIC LUMBAR PUNCTURE UNDER FLUOROSCOPIC GUIDANCE FLUOROSCOPY TIME:  Fluoroscopy Time:  1 minutes 12 seconds Radiation Exposure Index (if provided by the fluoroscopic device): Dose area product is 229 micro Gy meter squared. Reference air car month 21.4 mGy. Number of Acquired Spot Images: 1 PROCEDURE: Informed consent was obtained from the patient prior to the procedure, including potential complications of headache, allergy, and pain. With the patient prone, the lower back was prepped with Betadine. 1% Lidocaine was used for local anesthesia. Lumbar puncture was performed at the L2-3 level using a 20 gauge needle with return of clear colorless CSF. 8 ml of CSF were obtained for laboratory studies. The patient tolerated the procedure well and there were no apparent complications. IMPRESSION: Lumbar puncture performed under fluoroscopic guidance without complication. 8 mL clear colorless CSF was obtained for laboratory evaluation. Electronically Signed   By: Lucienne Capers M.D.   On: 05/11/2017 21:33      Subjective: Headache resolved after medications she received yesterday and no recurrence since then. Denies visual symptoms or hearing deficits. Denies  fever, cough, runny nose, body aches or sneezing. No dyspnea or pain elsewhere.   Discharge Exam:  Vitals:   05/15/17 2115 05/16/17 0031 05/16/17 0520 05/16/17 1013  BP: (!) 126/52 129/64 138/83 (!) 143/79  Pulse: 69 81 81 88  Resp: '20 18 20 20  '$ Temp: 98 F (36.7 C) 98.5 F (36.9 C) 98.7 F (37.1 C)   TempSrc: Oral Oral Oral Oral  SpO2: 94% 96% 98% 98%  Weight:      Height:        General: Pt lying comfortably in bed & appears in no obvious distress. Cardiovascular: S1 & S2 heard, RRR, S1/S2 +. No murmurs, rubs, gallops or clicks. No JVD or pedal  edema. Respiratory: Clear to auscultation without wheezing, rhonchi or crackles. No increased work of breathing. Abdominal:  Non distended, non tender & soft. No organomegaly or masses appreciated. Normal bowel sounds heard. CNS: Alert and oriented. No focal deficits. Extremities: no edema, no cyanosis    The results of significant diagnostics from this hospitalization (including imaging, microbiology, ancillary and laboratory) are listed below for reference.     Microbiology: Recent Results (from the past 240 hour(s))  Hsv Culture And Typing     Status: None   Collection Time: 05/11/17  8:52 PM  Result Value Ref Range Status   HSV Culture/Type Comment  Final    Comment: (NOTE) Negative No Herpes simplex virus isolated. Performed At: [facility] [place], [place] [id number] Rush Farmer MD OZ:[id number]    Source of Sample CSF  Final  CSF culture     Status: None   Collection Time: 05/11/17  8:59 PM  Result Value Ref Range Status   Specimen Description CSF  Final   Special Requests NONE  Final   Gram Stain   Final    CYTOSPIN SMEAR WBC PRESENT, PREDOMINANTLY PMN NO ORGANISMS SEEN    Culture NO GROWTH 3 DAYS  Final   Report Status 05/15/2017 FINAL  Final     Labs: CBC: Recent Labs  Lab 05/11/17 1615 05/12/17 0354 05/13/17 0317 05/14/17 0506 05/15/17 0434 05/16/17 0427  WBC 4.3 3.5*  3.5* 4.1 4.1 3.7*  NEUTROABS 1.9 2.0  --   --   --   --   HGB 14.3 12.6 12.6 13.3 14.1 14.0  HCT 40.9 37.3 36.8 38.9 40.3 40.4  MCV 88.3 88.8 88.5 88.4 89.4 89.0  PLT 193 173 182 194 192 259   Basic Metabolic Panel: Recent Labs  Lab 05/12/17 0354 05/13/17 0317 05/14/17 0506 05/15/17 0434 05/16/17 0427  NA 136 132* 137 134* 137  K 3.5 3.7 4.0 3.9 4.0  CL 101 103 104 103 106  CO2 '25 23 25 23 23  '$ GLUCOSE 154* 108* 112* 107* 94  BUN '8 9 7 7 12  '$ CREATININE 0.79 0.63 0.70 0.63 0.69  CALCIUM 9.2 8.9 9.4 9.3 9.3   Liver Function Tests: Recent Labs  Lab 05/11/17 1615 05/12/17 0354  AST 27 22  ALT 20 19  ALKPHOS 59 52  BILITOT 0.9 0.9  PROT 7.4 6.8  ALBUMIN 4.4 3.6   CBG: Recent Labs  Lab 05/15/17 1135 05/15/17 1638 05/15/17 2200 05/16/17 0607 05/16/17 1145  GLUCAP 78 96 83 132* 88       Time coordinating discharge: Over 30 minutes  SIGNED:  Vernell Leep, MD, FACP, [facility]. [facility] Pager [phone]  If 7PM-7AM, please contact night-coverage [email or web address] Password [email or web address] 05/16/2017, 1:53 PM

## 2017-05-16 NOTE — Telephone Encounter (Signed)
Nurse's-patient recently discharged from the hospital. Please call patient, let them know that we are aware that they were discharged from the hospital. Please schedule them to follow-up with [place] within the next 7 days. Advised the patient to bring all of their medications with him to the visit. Please inquire if they are having any acute issues currently and documented accordingly. There are multiple various issues that need following up with this patient from CAT scans through the lab work it is very important for this patient to follow-up in 1 week

## 2017-05-16 NOTE — Discharge Instructions (Addendum)

## 2017-05-16 NOTE — Progress Notes (Addendum)
Discharge instructions reviewed with patient.   Copy of instructions given to pt. (no new scripts). Letter from MD for her work return given to pt.   At 1550---Pt d/c'd via wheelchair with belongings, with family.         Escorted by unit NT.

## 2017-05-16 NOTE — Consult Note (Signed)
   [facility] CM Inpatient Consult   05/16/2017  Amy Dean 19-Jul-1965 [id number]     Came to visit [name] prior to hospital discharge on behalf of [facility] [employer] program for [facility] employees dependents with [facility] insurance.  [name] states she was previously active with the [employer] program for DM management. However, she states she has to get restarted on her app because she has a new phone. States her new number is [phone].  Discussed that she will receive a post hospital discharge call.   Provided 24-hr nurse advice line magnet.   Appreciative of visit.   Marthenia Rolling, MSN-Ed, RN,BSN [facility] Liaison [phone]

## 2017-05-16 NOTE — Progress Notes (Addendum)
Subjective: Significant improvement in headache following cocktail yesterday.   Exam: Vitals:   05/16/17 0031 05/16/17 0520  BP: 129/64 138/83  Pulse: 81 (!) 18  Resp: 18 20  Temp: 98.5 F (36.9 C) 98.7 F (37.1 C)  SpO2: 96% 98%   Gen: In bed, NAD Resp: non-labored breathing, no acute distress Abd: soft, nt  Neuro: MS: Awake, alert, interactive and appropriate CN: Visual fields full Motor: Moves all extremities well  Pertinent Labs: Studies: CSF WBC - 26(lymphs) CSF RBC - 4 CSF protein - 69 CSF glucose 51  On CSF:  Crypto Ag- negative  HSV - negative  EBV- negative  VZV-pending  Lyme Ab- pending  VDRL CSF - negative  CSF Culture - negative  HIV - negative  ESR 3 CRP - < 0.8 Anti-Jo1 - neg ENA(incldues RP, SSA, SSB, Scl-70, ENA, dsDNA) - negative Oligoclonal bands - pending   ACE, CSF- 0.4(normal) ACE, serum <15   Impression: 52 year old female with new onset headaches and abnormal enhancement in the perivascular spaces. The differential remains wide, but thus far labs have been relatively reassuring. LP for cytology performed this AM. 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   A short course of steriods(e.g. Medrol dose pack) may be helpful symptomatically, but she is currently with very little headache.   Recommendations: 1) would use NSAIDs for headache, if continues to have symptoms in a week or so, could consider medrol dose pack.  2) Await cytology.  3) f/u with outpatient neurology 4) would repeat imaging as to ensure improvement as outpatient.   Roland Rack, MD [facility] [phone]  If 7pm- 7am, please page neurology on call as listed in [place].

## 2017-05-16 NOTE — Care Management Note (Signed)
Case Management Note  Patient Details  Name: Amy Dean MRN: [id number] Date of Birth: August 05, 1965  Subjective/Objective:    Pt in with headache. She is from home with spouse.              Action/Plan: Pt discharging home with self care. Pt has PCP, insurance, and transportation home. No further needs per CM.   Expected Discharge Date:  05/16/17               Expected Discharge Plan:  Home/Self Care  In-House Referral:     Discharge planning Services     Post Acute Care Choice:    Choice offered to:     DME Arranged:    DME Agency:     HH Arranged:    HH Agency:     Status of Service:  Completed, signed off  If discussed at [employer] of Stay Meetings, dates discussed:    Additional Comments:  Pollie Friar, RN 05/16/2017, 2:17 PM

## 2017-05-17 ENCOUNTER — Other Ambulatory Visit: Payer: Self-pay

## 2017-05-17 LAB — VARICELLA-ZOSTER BY PCR: Varicella-Zoster, PCR: NEGATIVE

## 2017-05-17 NOTE — Patient Outreach (Signed)
Amy Dean) Care Management  05/17/2017  LENITA PEREGRINA 1965-07-23 [id number]   Telephone call for transition of care call.  Member was hospitalized for headache with likely viral/post viral meningitis and discharged on 05/16/17. Member has hx of Type 2 DM and is in the [employer] program  Subjective: States that she is still having some headache.  States she is to go to see Dr.Luking on 05/23/17 and neurology in February.  States she has applied for her [employer].  States she has the short term disability and the hospital indemnity plan but she has not called to apply for benefits yet.  States her husband and son can help her if needed and drive her to appts if needed.  States that they told her she can go back to work on Tuesday but she may need more time.  States she will talk to her doctor about this.  States she had been under stress recently.  States she got a new phone and she has not gotten the [employer] application added to her phone but she does want to remain in the program.  States her blood sugars have been good in the 80-90 range.  Patient was recently discharged from hospital and all medications have been reviewed. Transition of care call completed Reviewed s/s to call MD Reinforced to keep appt with Dr. Wolfgang Phoenix on 05/23/17 Given phone numbers for human resources, [employer] short term disability, [employer] for hospital indemnity and the Employee Assistance Counseling program.  Plan to mail Employee Assistance Counseling Program brochure to member. Given number of Technical support for [facility] and Care team message sent to [facility] to assist member to get reactivated in the [facility] program.    Plan to close case as member has no further case management needs.  Member will continue to be followed in the [place] program Plan to send successful outreach letter with brochure for [place] RN, [place]  Management Coordinator [facility] [employer] [phone]

## 2017-05-17 NOTE — Telephone Encounter (Signed)
Left message to return call 

## 2017-05-17 NOTE — Telephone Encounter (Signed)
Discussed with pt. Pt states she is still having migraines. Dr in hospital stopped her migraine meds and she is now only taking tylenol and its not helping. Pt already had appt scheduled for hospital follow up on jan 16th.   [facility].

## 2017-05-17 NOTE — Telephone Encounter (Signed)
So initially we were treating her with migraine when we saw her ordered an MRI as she was having issues so is now the effects patient.  Migraine medicine would not be helpful for her.  I can prescribe some hydrocodone if severe for short use

## 2017-05-18 LAB — B. BURGDORFI ANTIBODIES, CSF

## 2017-05-18 MED ORDER — HYDROCODONE-ACETAMINOPHEN 5-325 MG PO TABS: 1.0000 | ORAL_TABLET | ORAL | 0 refills | Status: DC | PRN

## 2017-05-18 NOTE — Addendum Note (Signed)
Addended by: Dairl Ponder on: 05/18/2017 04:33 PM   Modules accepted: Orders

## 2017-05-18 NOTE — Telephone Encounter (Signed)
Hydrocodone 5/325, 1 q4 prn #20, caution drowsiness

## 2017-05-18 NOTE — Telephone Encounter (Signed)
Patient says she would like to pick up a prescription for hydrocodone on Monday-weather permitting.

## 2017-05-21 DIAGNOSIS — Z0289 Encounter for other administrative examinations: Secondary | ICD-10-CM

## 2017-05-21 MED FILL — HYDROCODON-APAP 5-325: 5-325 | 3 days supply | Qty: 20 | Fill #0

## 2017-05-21 MED FILL — LISINOPRIL-HCTZ 20-12.5 MG: 20-12.5 | 90 days supply | Qty: 90 | Fill #1

## 2017-05-21 NOTE — Telephone Encounter (Signed)
Prescription upfront for pick up. Patient notified. 

## 2017-05-23 ENCOUNTER — Telehealth: Payer: Self-pay | Admitting: Family Medicine

## 2017-05-23 ENCOUNTER — Ambulatory Visit: Payer: 59 | Admitting: Family Medicine

## 2017-05-23 ENCOUNTER — Encounter: Payer: Self-pay | Admitting: Family Medicine

## 2017-05-23 VITALS — BP 138/90 | Temp 98.3°F | Ht 64.0 in | Wt 189.0 lb

## 2017-05-23 DIAGNOSIS — D72819 Decreased white blood cell count, unspecified: Secondary | ICD-10-CM | POA: Diagnosis not present

## 2017-05-23 DIAGNOSIS — G030 Nonpyogenic meningitis: Secondary | ICD-10-CM

## 2017-05-23 DIAGNOSIS — E119 Type 2 diabetes mellitus without complications: Secondary | ICD-10-CM | POA: Diagnosis not present

## 2017-05-23 NOTE — Telephone Encounter (Signed)
Patient had paper faxed over on 1/10 19 and needing them back by 1/18 if possible. In your yellow folder. Please date/sign and fill in highlighted areas.

## 2017-05-23 NOTE — Telephone Encounter (Signed)
Please see the message below.

## 2017-05-23 NOTE — Progress Notes (Signed)
   Subjective:    Patient ID: Amy Dean, female    DOB: Sep 22, 1965, 52 y.o.   MRN: [id number]  HPI Follow up hospitalization.   Still having headaches.   Numbness left side around mouth and numbness in left arm and hand. Started about 10 mins ago.  complex stay in the hospital due to probable aseptic meningitis so far all of her tests look good fungal culture is pending significant time spent with the patient reviewing her CAT scans and MRIs lab work.  Patient having occasional headaches but nowhere near what she was having previously.  She denies any high fever chills sweats denies nausea vomiting diarrhea  Patient has returned to work she has not been coming up with neurology  She states she had a spell earlier today where she felt numb on the left upper part of her lip lasting a few minutes it went away then she had a numbness feeling in her hand left side with drawing sensation but no true weakness lasted a few minutes went away has not had any other symptoms    Review of Systems  Constitutional: Negative for activity change and appetite change.  HENT: Negative for congestion.   Respiratory: Negative for cough.   Cardiovascular: Negative for chest pain.  Gastrointestinal: Negative for abdominal pain and vomiting.  Skin: Negative for color change.  Neurological: Negative for weakness.  Psychiatric/Behavioral: Negative for confusion.       Objective:   Physical Exam  Constitutional: She appears well-nourished. No distress.  HENT:  Head: Normocephalic.  Right Ear: External ear normal.  Left Ear: External ear normal.  Eyes: Right eye exhibits no discharge. Left eye exhibits no discharge.  Neck: No tracheal deviation present.  Cardiovascular: Normal rate, regular rhythm and normal heart sounds.  No murmur heard. Pulmonary/Chest: Effort normal and breath sounds normal. No respiratory distress. She has no wheezes. She has no rales.  Musculoskeletal: She exhibits no  edema.  Lymphadenopathy:    She has no cervical adenopathy.  Neurological: She is alert.  Psychiatric: Her behavior is normal.  Vitals reviewed.  Neurologically she is intact no focal signs on today's exam Her MRI as well as CTA reports were reviewed.  No sign of any carotid disease Blood pressure under good control diabetes under good control      Assessment & Plan:  Aseptic meningitis Repeat lab work to look at white blood cells All results were reviewed at Patient has follow-up with neurology  Patient had unusual neurologic symptoms I told her if these occur again especially if they last more than 15 minutes to immediately go to ER I doubt stroke but they do sound like a possible TIA the patient has had already all the appropriate tests but may need further testing recommend follow-up with neurology in the meantime 81 mg aspirin daily  Patient also has a spot on her lower lungs/abdomen CAT scan showed a small spot in the lower lung chest CAT scan did not show any spot we will discuss this with radiology to see if she needs follow-up in 3 months  It should be noted I did speak with radiology they compared the films they found it evident on both and recommended a follow-up scan in 3 months the most likely it will be benign

## 2017-05-23 NOTE — Telephone Encounter (Signed)
Form was completed please process accordingly

## 2017-05-24 ENCOUNTER — Encounter: Payer: Self-pay | Admitting: Family Medicine

## 2017-05-24 LAB — CBC WITH DIFFERENTIAL/PLATELET
Basophils Absolute: 0 10*3/uL (ref 0.0–0.2)
Basos: 0 %
EOS (ABSOLUTE): 0.1 10*3/uL (ref 0.0–0.4)
Eos: 2 %
Hematocrit: 40 % (ref 34.0–46.6)
Hemoglobin: 14.1 g/dL (ref 11.1–15.9)
Immature Grans (Abs): 0 10*3/uL (ref 0.0–0.1)
Immature Granulocytes: 0 %
Lymphocytes Absolute: 1.6 10*3/uL (ref 0.7–3.1)
Lymphs: 28 %
MCH: 30.8 pg (ref 26.6–33.0)
MCHC: 35.3 g/dL (ref 31.5–35.7)
MCV: 87 fL (ref 79–97)
Monocytes Absolute: 0.6 10*3/uL (ref 0.1–0.9)
Monocytes: 10 %
Neutrophils Absolute: 3.3 10*3/uL (ref 1.4–7.0)
Neutrophils: 60 %
Platelets: 234 10*3/uL (ref 150–379)
RBC: 4.58 x10E6/uL (ref 3.77–5.28)
RDW: 13.4 % (ref 12.3–15.4)
WBC: 5.6 10*3/uL (ref 3.4–10.8)

## 2017-05-24 LAB — HEMOGLOBIN A1C
Est. average glucose Bld gHb Est-mCnc: 123 mg/dL
Hgb A1c MFr Bld: 5.9 % — ABNORMAL HIGH (ref 4.8–5.6)

## 2017-06-04 MED FILL — ALPRAZolam 0.5 MG TABS: 0.5 | 30 days supply | Qty: 30 | Fill #2

## 2017-06-06 ENCOUNTER — Telehealth: Payer: Self-pay | Admitting: Family Medicine

## 2017-06-06 NOTE — Telephone Encounter (Signed)
Left message to return call 

## 2017-06-06 NOTE — Telephone Encounter (Signed)
Please let the patient know that I did in fact speak with radiology regarding her scans early January.  The specialist does recommend a follow-up CT scan of the chest in 3 months without contrast.  Reason pulmonary nodule.  This CT scan should be done in early April.  Please put in the reminder file.  Please notify patient and document that she understands this.  More than likely this is benign but it is necessary to do a follow-up scan-if this area is changing size then it would require other testing

## 2017-06-11 ENCOUNTER — Telehealth: Payer: Self-pay | Admitting: Family Medicine

## 2017-06-11 NOTE — Telephone Encounter (Signed)
Nurses-regardless if the patient returns a phone call please put follow-up CT scan for April in the tickler file as discussed on the previous message thank you very much

## 2017-06-12 NOTE — Telephone Encounter (Signed)
I called and left a message asked that she r/c. 

## 2017-06-12 NOTE — Telephone Encounter (Signed)
I have placed the order in the tickler file as request for early April.

## 2017-06-14 LAB — FUNGUS CULTURE WITH STAIN

## 2017-06-14 LAB — FUNGUS CULTURE RESULT

## 2017-06-14 LAB — FUNGAL ORGANISM REFLEX

## 2017-06-14 NOTE — Telephone Encounter (Signed)
Green card mailed. Asked that r/c.

## 2017-06-18 NOTE — Telephone Encounter (Signed)
We have tried to contact patient multiple times over last several weeks via phone and mail with no response- test was added to the [place] file

## 2017-06-20 ENCOUNTER — Ambulatory Visit: Payer: 59 | Admitting: Neurology

## 2017-06-20 ENCOUNTER — Encounter: Payer: Self-pay | Admitting: Neurology

## 2017-06-20 VITALS — BP 143/84 | HR 84 | Ht 64.0 in | Wt 192.0 lb

## 2017-06-20 DIAGNOSIS — R51 Headache: Secondary | ICD-10-CM | POA: Diagnosis not present

## 2017-06-20 DIAGNOSIS — R410 Disorientation, unspecified: Secondary | ICD-10-CM | POA: Diagnosis not present

## 2017-06-20 DIAGNOSIS — R29818 Other symptoms and signs involving the nervous system: Secondary | ICD-10-CM

## 2017-06-20 DIAGNOSIS — R930 Abnormal findings on diagnostic imaging of skull and head, not elsewhere classified: Secondary | ICD-10-CM

## 2017-06-20 DIAGNOSIS — R519 Headache, unspecified: Secondary | ICD-10-CM

## 2017-06-20 NOTE — Progress Notes (Signed)
PATIENT: Amy Dean DOB: 1965/08/26  Chief Complaint  Patient presents with  . Headache    Reports frequent headaches starting at the beginning of January.  She had an abnormal MRI brain on 05/11/17 (ordered by PCP) and was sent to the hospital to rule out menengitis.  She was dicharged on 05/16/17.  After her discharged, she has continued to have two moderate headaches per week. She will occasionally have nausea and vomiting with some of her headaches. She uses Excedrin which typically resolves her pain.    Amy Kitchen PCP    Kathyrn Drown, MD     HISTORICAL  Amy Dean is a 52 year old female, seen in refer by primary care doctor Kathyrn Drown, for evaluation of abnormal MRI of the brain, hospital discharge in January 2019.  Initial evaluation was on June 20, 2017.  She had a past medical history of hypertension, depression, diabetes, worked as a Radiation protection practitioner at [employer], deny a previous history of headaches.  At the end of December 2018, she began to develop holoacranial pressure headache, quickly exacerbated to a more severe degree, with neck stiffness, confusion, dizziness, nausea, she also reported transient left-sided hearing loss lasting for 30 minutes, has led to hospital admission January 4-9, 2019,  Extensive evaluation reviewed, there is evidence of abnormal MRI brain findings, extensive enhancement of perivascular space,raised the concern of meningitis,  Reviewed MRI of the brain with and without contrast on May 11, 2017: Extensive enhancement within dilated perivascular space, T2 signal changes most noticeable within the right lentiform nucleus and thalamus bilaterally, left greater than right.  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  Laboratory evaluations, A1c 5.9, normal CBC, BMP, angiotensin-converting enzyme normal Normal negative HIV, anti-Jo antibody, double-stranded DNA, SSA, SSB, scleroderma antibody, ENA,  Ribonuclear protein, EB virus, varicella-zoster antibody, Lyme titer, cryptococcal antigen, C-reactive protein CSF fungus culture negative  Spinal tap May 15, 2017: RBC 8, WBC 48, lymphocyte 92%, monocytes 6%, total protein 93, glucose 48  May 11, 2017: RBC 4, WBC 26, lymphocyte 96%, monocyte 4%, oligoclonal banding, VDRL, HSV PCR  Chest x-ray, CT chest was negative,  CT abdomen pelvic showed 10 x 10 x 6 mm right lower lobe pulmonary obesity, mildly enlarged pelvic lymph node.  She is now back to work, but complains of fatigue, forgetfulness, difficulty concentrate, her headache overall has much improved, couple times a week, developed mild holoacranial pressure headaches, responding well to over-the-counter Excedrin Migraine,   REVIEW OF SYSTEMS: Full 14 system review of systems performed and notable only for headaches, anxiety, decreased energy  ALLERGIES: No Known Allergies  HOME MEDICATIONS: Current Outpatient Medications  Medication Sig Dispense Refill  . acetaminophen (TYLENOL) 500 MG tablet Take 500 mg by mouth every 8 (eight) hours as needed for mild pain. Reported on 07/12/2015    . ALPRAZolam (XANAX) 0.5 MG tablet TAKE 1 TABLET BY MOUTH AT BEDTIME AS NEEDED FOR SLEEP 30 tablet 5  . amLODipine (NORVASC) 10 MG tablet Take 1 tablet (10 mg total) by mouth daily. 30 tablet 5  . aspirin 81 MG tablet Take 81 mg by mouth daily.    . cetirizine (ZYRTEC) 10 MG tablet Take 1 tablet (10 mg total) by mouth daily as needed for allergies.    Amy Kitchen  cholecalciferol (VITAMIN D) 1000 units tablet Take 1,000 Units by mouth daily.    . citalopram (CELEXA) 20 MG tablet Take 1 tablet (20 mg total) by mouth daily.    . fexofenadine (ALLEGRA) 180 MG tablet Take 1 tablet (180 mg total)  by mouth daily. 90 tablet 1  . fluticasone (FLONASE) 50 MCG/ACT nasal spray Place 2 sprays into both nostrils daily as needed for allergies.    Amy Kitchen HYDROcodone-acetaminophen (NORCO/VICODIN) 5-325 MG tablet Take 1 tablet by mouth every 4 (four) hours as needed. 20 tablet 0  . Insulin Pen Needle (PEN NEEDLES 31GX5/16") 31G X 8 MM MISC Use daily as directed 100 each 11  . ketoconazole (NIZORAL) 2 % cream Apply 1 application topically 3 (three) times a week.    Amy Kitchen lisinopril-hydrochlorothiazide (PRINZIDE,ZESTORETIC) 20-12.5 MG tablet TAKE 1 TABLET BY MOUTH DAILY FOR FOR BLOOD PRESSURE 90 tablet 1  . metFORMIN (GLUCOPHAGE) 500 MG tablet Take 2 tablets (1,000 mg total) by mouth daily with breakfast.    . naproxen sodium (ANAPROX) 220 MG tablet Take 220-440 mg by mouth daily as needed (for pain).    Amy Kitchen omega-3 acid ethyl esters (LOVAZA) 1 g capsule Take 1 capsule (1 g total) by mouth daily.    . pantoprazole (PROTONIX) 40 MG tablet Take 1 tablet (40 mg total) by mouth daily as needed (acid reflux).    . pravastatin (PRAVACHOL) 40 MG tablet Take 1 tablet (40 mg total) by mouth daily. 90 tablet 1  . triamcinolone cream (KENALOG) 0.1 % Apply 1 application topically 3 (three) times a week.    Amy Kitchen VICTOZA 18 MG/3ML SOPN INJECT 1.2 MG SUBQ ONCE DAILY 9 mL 5   No current facility-administered medications for this visit.     PAST MEDICAL HISTORY: Past Medical History:  Diagnosis Date  . Anemia    Resolved post hysterectomy  . Enteritis   . Essential hypertension   . Headache   . Hyperlipidemia   . Type 2 diabetes mellitus ([place])     PAST SURGICAL HISTORY: Past Surgical History:  Procedure Laterality Date  . ABDOMINAL HYSTERECTOMY    . [place]   x2  . COLONOSCOPY N/A 04/22/2015   Procedure: COLONOSCOPY;  Surgeon: Rogene Houston, MD;  [facility];  Service: Endoscopy;  Laterality: N/A;  1:00  . EUS  10/26/2011   Procedure: UPPER ENDOSCOPIC ULTRASOUND (EUS) LINEAR;   Surgeon: Milus Banister, MD;  [facility];  Service: Endoscopy;  Laterality: N/A;  . HYSTEROSCOPY W/ ENDOMETRIAL ABLATION  07/22/2008   K.Harrington Challenger MD [facility]  . SUPRACERVICAL ABDOMINAL HYSTERECTOMY  03/21/2011   Procedure: HYSTERECTOMY SUPRACERVICAL ABDOMINAL;  Surgeon: Jonnie Kind, MD;  Location: [facility];  Service: Gynecology;;  . TONSILLECTOMY    . TUBAL LIGATION      FAMILY HISTORY: Family History  Problem Relation Age of Onset  . Healthy Son   . Sarcoidosis Mother   . Heart attack Mother   . Stroke Mother   . Other Father        unsure of history  . Heart attack Paternal Uncle   . Hypertension Maternal Grandmother   . Anesthesia problems Neg Hx   . Hypotension Neg Hx   . Malignant hyperthermia Neg Hx   . Pseudochol deficiency Neg Hx     SOCIAL HISTORY:  Social History   Socioeconomic History  . Marital status: Married    Spouse name: Careena, Degraffenreid.  . Number of children: 1  .  Years of education: 12th grade  . Highest education level: Not on file  Social Needs  . Financial resource strain: Not on file  . Food insecurity - worry: Not on file  . Food insecurity - inability: Not on file  . Transportation needs - medical: Not on file  . Transportation needs - non-medical: Not on file  Occupational History  . Occupation: CSPDT    Employer: [employer]  Tobacco Use  . Smoking status: Former Smoker    Packs/day: 1.50  . Smokeless tobacco: Never Used  Substance and Sexual Activity  . Alcohol use: Yes    Comment: occastionally on holidays; 2 drinks at a time  . Drug use: No  . Sexual activity: Yes    Birth control/protection: Surgical  Other Topics Concern  . Not on file  Social History Narrative   Lives with her husband.   Right-handed.   Occasional soda (1-2 times per week).     PHYSICAL EXAM   Vitals:   06/20/17 0745  BP: (!) 143/84  Pulse: 84  Weight: 192 lb (87.1 kg)  Height: 5\' 4"  (1.626 m)    Not recorded      Body mass  index is 32.96 kg/m.  PHYSICAL EXAMNIATION:  Gen: NAD, conversant, well nourised, obese, well groomed                     Cardiovascular: Regular rate rhythm, no peripheral edema, warm, nontender. Eyes: Conjunctivae clear without exudates or hemorrhage Neck: Supple, no carotid bruits. Pulmonary: Clear to auscultation bilaterally   NEUROLOGICAL EXAM:  MENTAL STATUS: Depressed looking middle-aged female Speech:    Speech is normal; fluent and spontaneous with normal comprehension.  Cognition:     Orientation to time, place and person     Normal recent and remote memory     Normal Attention span and concentration     Normal Language, naming, repeating,spontaneous speech     Fund of knowledge   CRANIAL NERVES: CN II: Visual fields are full to confrontation. Fundoscopic exam is normal with sharp discs and no vascular changes. Pupils are round equal and briskly reactive to light. CN III, IV, VI: extraocular movement are normal. No ptosis. CN V: Facial sensation is intact to pinprick in all 3 divisions bilaterally. Corneal responses are intact.  CN VII: Face is symmetric with normal eye closure and smile. CN VIII: Hearing is normal to rubbing fingers CN IX, X: Palate elevates symmetrically. Phonation is normal. CN XI: Head turning and shoulder shrug are intact CN XII: Tongue is midline with normal movements and no atrophy.  MOTOR: There is no pronator drift of out-stretched arms. Muscle bulk and tone are normal. Muscle strength is normal.  REFLEXES: Reflexes are 2+ and symmetric at the biceps, triceps, knees, and ankles. Plantar responses are flexor.  SENSORY: Intact to light touch, pinprick, positional sensation and vibratory sensation are intact in fingers and toes.  COORDINATION: Rapid alternating movements and fine finger movements are intact. There is no dysmetria on finger-to-nose and heel-knee-shin.    GAIT/STANCE: Posture is normal. Gait is steady with normal steps,  base, arm swing, and turning. Heel and toe walking are normal.  Mild difficulty with tandem walking Romberg is absent.   DIAGNOSTIC DATA (LABS, IMAGING, TESTING) - I reviewed patient records, labs, notes, testing and imaging myself where available.   ASSESSMENT AND PLAN  ALANNY RIVERS is a 52 y.o. female   Subacute onset meningitis  Evidence of perivascular meningeal enhancement,  abnormal spinal fluid findings, elevated WBC, total protein, lymphocytic predominant, mild decreased glucose,  But extensive evaluations, including virus PCR, cryptococcal antigen, failed to demonstrate exact etiology  Overall symptoms has much improved,  Will repeat MRI of the brain with and without contrast, repeat lumbar puncture  NSAIDs as needed for symptomatic treatment  Laboratory evaluation   Marcial Pacas, M.D. Ph.D.  [facility] [address], [place], [place] [address] Ph: [phone] Fax: [phone]  CC: Kathyrn Drown, MD

## 2017-06-21 LAB — CBC WITH DIFFERENTIAL/PLATELET
Basophils Absolute: 0 10*3/uL (ref 0.0–0.2)
Basos: 0 %
EOS (ABSOLUTE): 0.2 10*3/uL (ref 0.0–0.4)
Eos: 4 %
Hematocrit: 39.7 % (ref 34.0–46.6)
Hemoglobin: 13.6 g/dL (ref 11.1–15.9)
Immature Grans (Abs): 0 10*3/uL (ref 0.0–0.1)
Immature Granulocytes: 0 %
Lymphocytes Absolute: 1.6 10*3/uL (ref 0.7–3.1)
Lymphs: 37 %
MCH: 31.3 pg (ref 26.6–33.0)
MCHC: 34.3 g/dL (ref 31.5–35.7)
MCV: 91 fL (ref 79–97)
Monocytes Absolute: 0.4 10*3/uL (ref 0.1–0.9)
Monocytes: 8 %
Neutrophils Absolute: 2.2 10*3/uL (ref 1.4–7.0)
Neutrophils: 51 %
Platelets: 204 10*3/uL (ref 150–379)
RBC: 4.35 x10E6/uL (ref 3.77–5.28)
RDW: 13.9 % (ref 12.3–15.4)
WBC: 4.4 10*3/uL (ref 3.4–10.8)

## 2017-06-21 LAB — COMPREHENSIVE METABOLIC PANEL
ALT: 20 IU/L (ref 0–32)
AST: 20 IU/L (ref 0–40)
Albumin/Globulin Ratio: 2 (ref 1.2–2.2)
Albumin: 4.5 g/dL (ref 3.5–5.5)
Alkaline Phosphatase: 63 IU/L (ref 39–117)
BUN/Creatinine Ratio: 13 (ref 9–23)
BUN: 9 mg/dL (ref 6–24)
Bilirubin Total: 0.6 mg/dL (ref 0.0–1.2)
CO2: 23 mmol/L (ref 20–29)
Calcium: 9.5 mg/dL (ref 8.7–10.2)
Chloride: 102 mmol/L (ref 96–106)
Creatinine, Ser: 0.69 mg/dL (ref 0.57–1.00)
GFR calc Af Amer: 116 mL/min/{1.73_m2} (ref >59)
GFR calc non Af Amer: 100 mL/min/{1.73_m2} (ref >59)
Globulin, Total: 2.3 g/dL (ref 1.5–4.5)
Glucose: 91 mg/dL (ref 65–99)
Potassium: 4.1 mmol/L (ref 3.5–5.2)
Sodium: 139 mmol/L (ref 134–144)
Total Protein: 6.8 g/dL (ref 6.0–8.5)

## 2017-06-21 LAB — VITAMIN B12: Vitamin B-12: 408 pg/mL (ref 232–1245)

## 2017-06-21 LAB — TSH: TSH: 0.72 u[IU]/mL (ref 0.450–4.500)

## 2017-06-21 LAB — VITAMIN D 25 HYDROXY (VIT D DEFICIENCY, FRACTURES): Vit D, 25-Hydroxy: 42.1 ng/mL (ref 30.0–100.0)

## 2017-06-21 LAB — C-REACTIVE PROTEIN: CRP: 3 mg/L (ref 0.0–4.9)

## 2017-06-21 LAB — RPR: RPR Ser Ql: NONREACTIVE

## 2017-06-21 LAB — SEDIMENTATION RATE: Sed Rate: 2 mm/hr (ref 0–40)

## 2017-06-24 LAB — QUANTIFERON-TB GOLD PLUS
QuantiFERON Mitogen Value: >10 IU/mL
QuantiFERON Nil Value: 0.11 IU/mL
QuantiFERON TB1 Ag Value: 0.11 IU/mL
QuantiFERON TB2 Ag Value: 0.07 IU/mL
QuantiFERON-TB Gold Plus: NEGATIVE

## 2017-06-26 ENCOUNTER — Telehealth: Payer: Self-pay | Admitting: *Deleted

## 2017-06-26 NOTE — Telephone Encounter (Signed)
Left patient a detailed message, with results, on voicemail (ok per DPR).  Provided our number to call back with any questions.  

## 2017-06-26 NOTE — Telephone Encounter (Signed)
-----   Message from Marcial Pacas, MD sent at 06/25/2017  5:02 PM EST ----- Please call patient for normal laboratory result

## 2017-06-27 ENCOUNTER — Ambulatory Visit
Admission: RE | Admit: 2017-06-27 | Discharge: 2017-06-27 | Disposition: A | Discharge status: Home / Self Care | Payer: 59 | Source: Ambulatory Visit | Attending: Neurology | Admitting: Neurology

## 2017-06-27 DIAGNOSIS — R29818 Other symptoms and signs involving the nervous system: Secondary | ICD-10-CM | POA: Diagnosis not present

## 2017-06-27 MED ORDER — GADOBENATE DIMEGLUMINE 529 MG/ML IV SOLN
18.0000 mL | Freq: Once | INTRAVENOUS | Status: AC | PRN
  Administered 2017-06-27: 18 mL via INTRAVENOUS

## 2017-07-02 ENCOUNTER — Encounter: Payer: Self-pay | Admitting: Family Medicine

## 2017-07-02 NOTE — Telephone Encounter (Signed)
I dictated a letter regarding the need for this patient to have her CT scan of the chest without contrast to due to pulmonary nodule.-The letter needs to be sent certified- please send certified and document such

## 2017-07-03 ENCOUNTER — Ambulatory Visit
Admission: RE | Admit: 2017-07-03 | Discharge: 2017-07-03 | Disposition: A | Discharge status: Home / Self Care | Payer: 59 | Source: Ambulatory Visit | Attending: Neurology | Admitting: Neurology

## 2017-07-03 VITALS — BP 171/93 | HR 59

## 2017-07-03 DIAGNOSIS — R930 Abnormal findings on diagnostic imaging of skull and head, not elsewhere classified: Secondary | ICD-10-CM | POA: Diagnosis not present

## 2017-07-03 DIAGNOSIS — R51 Headache: Secondary | ICD-10-CM

## 2017-07-03 DIAGNOSIS — R519 Headache, unspecified: Secondary | ICD-10-CM

## 2017-07-03 DIAGNOSIS — R410 Disorientation, unspecified: Secondary | ICD-10-CM

## 2017-07-03 DIAGNOSIS — R29818 Other symptoms and signs involving the nervous system: Secondary | ICD-10-CM

## 2017-07-03 LAB — GRAM STAIN
MICRO NUMBER:: 90249979
SPECIMEN QUALITY:: ADEQUATE

## 2017-07-03 NOTE — Discharge Instructions (Signed)

## 2017-07-04 ENCOUNTER — Other Ambulatory Visit: Payer: Self-pay | Admitting: Family Medicine

## 2017-07-04 MED FILL — AMLODIPINE BESYLATE 10 MG T: 10 | 90 days supply | Qty: 90 | Fill #0

## 2017-07-04 MED FILL — CITALOPRAM HBR 20 MG TABLET: 20 | 90 days supply | Qty: 135 | Fill #0

## 2017-07-04 MED FILL — PRAVASTATIN NA 40 MG TAB: 40 | 90 days supply | Qty: 90 | Fill #0

## 2017-07-04 MED FILL — ALPRAZolam 0.5 MG TABS: 0.5 | 30 days supply | Qty: 30 | Fill #3

## 2017-07-04 NOTE — Telephone Encounter (Signed)
Last seen for meningitis January 2019

## 2017-07-04 NOTE — Telephone Encounter (Signed)
The patient is currently on amlodipine 10 mg therefore discontinue 5 mg.  Notify pharmacy.  The other medicine may be refilled for 6 months

## 2017-07-06 ENCOUNTER — Telehealth: Payer: Self-pay | Admitting: Neurology

## 2017-07-06 LAB — CSF CELL COUNT WITH DIFFERENTIAL
RBC Count, CSF: 34 cells/uL — ABNORMAL HIGH (ref 0–10)
WBC, CSF: 1 cells/uL (ref 0–5)

## 2017-07-06 LAB — PROTEIN, CSF: Total Protein, CSF: 62 mg/dL — ABNORMAL HIGH (ref 15–45)

## 2017-07-06 LAB — EPSTEIN BARR VIRUS DNA, QUANT RTPCR: EBV DNA, QN PCR: <200 copies/mL (ref <200)

## 2017-07-06 LAB — CMV (CYTOMEGALOVIRUS) DNA ULTRAQUANT, PCR
CMV DNA, QN PCR: <2.3 log IU/mL (ref <2.30)
CMV DNA, QN Real Time PCR: <200 IU/mL (ref <200)

## 2017-07-06 LAB — VARICELLA-ZOSTER BY PCR: VZV DNA, QL PCR: NOT DETECTED

## 2017-07-06 LAB — CRYPTOCOCCAL AG, LTX SCR RFLX TITER: Cryptococcal Ag Screen: NOT DETECTED

## 2017-07-06 LAB — GLUCOSE, CSF: Glucose, CSF: 67 mg/dL (ref 40–80)

## 2017-07-06 NOTE — Telephone Encounter (Signed)
Spoke with Denies and per YY, reviewed below results.  She verbalized understanding of same, voiced no questions/fim

## 2017-07-06 NOTE — Telephone Encounter (Signed)
Please call patient, spinal fluid testing showed elevated protein 62, no evidence of active infection.

## 2017-08-01 LAB — FUNGUS CULTURE W SMEAR
MICRO NUMBER:: 90249978
SMEAR:: NONE SEEN
SPECIMEN QUALITY:: ADEQUATE

## 2017-08-02 MED FILL — ALPRAZolam 0.5 MG TABS: 0.5 | 30 days supply | Qty: 30 | Fill #4

## 2017-08-06 ENCOUNTER — Encounter: Payer: Self-pay | Admitting: Neurology

## 2017-08-06 ENCOUNTER — Other Ambulatory Visit: Payer: Self-pay

## 2017-08-06 ENCOUNTER — Ambulatory Visit: Payer: 59 | Admitting: Neurology

## 2017-08-06 VITALS — BP 165/83 | HR 68 | Resp 16 | Ht 64.0 in | Wt 198.0 lb

## 2017-08-06 DIAGNOSIS — G039 Meningitis, unspecified: Secondary | ICD-10-CM | POA: Diagnosis not present

## 2017-08-06 NOTE — Progress Notes (Signed)
PATIENT: Amy Dean DOB: [birth year]  Chief Complaint  Patient presents with  . Headache    Sts. h/a's are much improved.  "I havent had one in so long, I couldn't tell you when it was."/fim  . Hx. of Abnormal MRI Brain     HISTORICAL  Amy Dean is a 52 year old female, seen in refer by primary care doctor Kathyrn Drown, for evaluation of abnormal MRI of the brain, hospital discharge in January 2019.  Initial evaluation was on June 20, 2017.  She had a past medical history of hypertension, depression, diabetes, worked as a Radiation protection practitioner at [employer], deny a previous history of headaches.  At the end of December 2018, she began to develop holoacranial pressure headache, quickly exacerbated to a more severe degree, with neck stiffness, confusion, dizziness, nausea, she also reported transient left-sided hearing loss lasting for 30 minutes, has led to hospital admission January 4-9, 2019,  Extensive evaluation reviewed, there is evidence of abnormal MRI brain findings, extensive enhancement of perivascular space,raised the concern of meningitis,  Reviewed MRI of the brain with and without contrast on May 11, 2017: Extensive enhancement within dilated perivascular space, T2 signal changes most noticeable within the right lentiform nucleus and thalamus bilaterally, left greater than right.  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  Laboratory evaluations, A1c 5.9, normal CBC, BMP, angiotensin-converting enzyme normal Normal negative HIV, anti-Jo antibody, double-stranded DNA, SSA, SSB, scleroderma antibody, ENA,  Ribonuclear protein, EB virus, varicella-zoster antibody, Lyme titer, cryptococcal antigen,  C-reactive protein CSF fungus culture negative  Spinal tap May 15, 2017: RBC 8, WBC 48, lymphocyte 92%, monocytes 6%, total protein 93, glucose 48  May 11, 2017: RBC 4, WBC 26, lymphocyte 96%, monocyte 4%, oligoclonal banding, VDRL, HSV PCR  Chest x-ray, CT chest was negative,  CT abdomen pelvic showed 10 x 10 x 6 mm right lower lobe pulmonary obesity, mildly enlarged pelvic lymph node.  She is now back to work, but complains of fatigue, forgetfulness, difficulty concentrate, her headache overall has much improved, couple times a week, developed mild holoacranial pressure headaches, responding well to over-the-counter Excedrin Migraine,  UPDATE August 06 2017: She is overall doing much better, go back to work now, only has occasional headache,  Being able to review MRI of the brain with and without contrast in February slight meningeal enhancement has improved, normality,  Laboratory evaluation showed normal or negative TSH, vitamin D, B12, RPR, CBC, CMP, ESR, CRP.  Spinal fluid testing showed mild elevated total protein of 62, WBC of 1, negative VZR, CMV, EB virus, Gram stain, fungal stain   REVIEW OF SYSTEMS: Full 14 system review of systems performed and notable only for headaches, anxiety, decreased energy  ALLERGIES: No Known Allergies  HOME MEDICATIONS: Current Outpatient Medications  Medication Sig Dispense Refill  . acetaminophen (TYLENOL) 500 MG tablet Take 500 mg by mouth every 8 (eight) hours as needed for mild pain. Reported on 07/12/2015    . ALPRAZolam (XANAX) 0.5 MG tablet TAKE 1 TABLET BY MOUTH AT BEDTIME AS NEEDED FOR SLEEP 30 tablet 5  . amLODipine (NORVASC) 10 MG tablet Take 1 tablet (10 mg total) by mouth daily. 30 tablet 5  . aspirin 81 MG tablet Take 81 mg by mouth  daily.    . cetirizine (ZYRTEC) 10 MG tablet Take 1 tablet (10 mg total) by mouth daily as needed for allergies.    . cholecalciferol (VITAMIN D) 1000 units tablet Take 1,000 Units by mouth  daily.    . citalopram (CELEXA) 20 MG tablet Take 1 tablet (20 mg total) by mouth daily.    . citalopram (CELEXA) 20 MG tablet TAKE 1 & 1/2 TABLETS BY MOUTH DAILY. 135 tablet 5  . fexofenadine (ALLEGRA) 180 MG tablet Take 1 tablet (180 mg total) by mouth daily. 90 tablet 1  . fluticasone (FLONASE) 50 MCG/ACT nasal spray Place 2 sprays into both nostrils daily as needed for allergies.    Marland Kitchen HYDROcodone-acetaminophen (NORCO/VICODIN) 5-325 MG tablet Take 1 tablet by mouth every 4 (four) hours as needed. 20 tablet 0  . Insulin Pen Needle (PEN NEEDLES 31GX5/16") 31G X 8 MM MISC Use daily as directed 100 each 11  . ketoconazole (NIZORAL) 2 % cream Apply 1 application topically 3 (three) times a week.    Marland Kitchen lisinopril-hydrochlorothiazide (PRINZIDE,ZESTORETIC) 20-12.5 MG tablet TAKE 1 TABLET BY MOUTH DAILY FOR FOR BLOOD PRESSURE 90 tablet 1  . metFORMIN (GLUCOPHAGE) 500 MG tablet Take 2 tablets (1,000 mg total) by mouth daily with breakfast.    . naproxen sodium (ANAPROX) 220 MG tablet Take 220-440 mg by mouth daily as needed (for pain).    Marland Kitchen omega-3 acid ethyl esters (LOVAZA) 1 g capsule Take 1 capsule (1 g total) by mouth daily.    . pantoprazole (PROTONIX) 40 MG tablet Take 1 tablet (40 mg total) by mouth daily as needed (acid reflux).    . pravastatin (PRAVACHOL) 40 MG tablet Take 1 tablet (40 mg total) by mouth daily. 90 tablet 1  . triamcinolone cream (KENALOG) 0.1 % Apply 1 application topically 3 (three) times a week.    Marland Kitchen VICTOZA 18 MG/3ML SOPN INJECT 1.2 MG SUBQ ONCE DAILY 9 mL 5   No current facility-administered medications for this visit.     PAST MEDICAL HISTORY: Past Medical History:  Diagnosis Date  . Anemia    Resolved post hysterectomy  . Enteritis   . Essential hypertension   . Headache   . Hyperlipidemia   . Type 2 diabetes mellitus ([place])     PAST SURGICAL HISTORY: Past Surgical History:  Procedure Laterality Date  . ABDOMINAL HYSTERECTOMY    . [place]   x2  . COLONOSCOPY N/A 04/22/2015   Procedure: COLONOSCOPY;  Surgeon: Rogene Houston, MD;  [facility];  Service: Endoscopy;  Laterality: N/A;  1:00  . EUS  10/26/2011   Procedure: UPPER ENDOSCOPIC ULTRASOUND (EUS) LINEAR;  Surgeon: Milus Banister, MD;  [facility];  Service: Endoscopy;  Laterality: N/A;  . HYSTEROSCOPY W/ ENDOMETRIAL ABLATION  07/22/2008   K.Harrington Challenger MD [facility]  . SUPRACERVICAL ABDOMINAL HYSTERECTOMY  03/21/2011   Procedure: HYSTERECTOMY SUPRACERVICAL ABDOMINAL;  Surgeon: Jonnie Kind, MD;  Location: [facility];  Service: Gynecology;;  . TONSILLECTOMY    . TUBAL LIGATION      FAMILY HISTORY: Family History  Problem Relation Age of Onset  . Healthy Son   . Sarcoidosis Mother   . Heart attack Mother   . Stroke Mother   . Other Father        unsure of history  . Heart attack Paternal Uncle   . Hypertension Maternal Grandmother   . Anesthesia problems Neg Hx   . Hypotension Neg Hx   .  Malignant hyperthermia Neg Hx   . Pseudochol deficiency Neg Hx     SOCIAL HISTORY:  Social History   Socioeconomic History  . Marital status: Married    Spouse name: Mikita, Lesmeister.  . Number of children: 1  . Years of education: 12th grade  . Highest education level: Not on file  Occupational History  . Occupation: CSPDT    Employer: [employer]  . Financial resource strain: Not on file  . Food insecurity:    Worry: Not on file    Inability: Not on file  . Transportation needs:    Medical: Not on file    Non-medical: Not on file  Tobacco Use  . Smoking status: Former Smoker    Packs/day: 1.50  . Smokeless tobacco: Never Used  Substance and Sexual Activity  . Alcohol use: Yes    Comment: occastionally on holidays; 2 drinks at a time  . Drug use: No  . Sexual activity: Yes    Birth control/protection: Surgical  Lifestyle  . Physical activity:    Days per week: Not on file    Minutes per session: Not on file  .  Stress: Not on file  Relationships  . Social connections:    Talks on phone: Not on file    Gets together: Not on file    Attends religious service: Not on file    Active member of club or organization: Not on file    Attends meetings of clubs or organizations: Not on file    Relationship status: Not on file  . Intimate partner violence:    Fear of current or ex partner: Not on file    Emotionally abused: Not on file    Physically abused: Not on file    Forced sexual activity: Not on file  Other Topics Concern  . Not on file  Social History Narrative   Lives with her husband.   Right-handed.   Occasional soda (1-2 times per week).     PHYSICAL EXAM   Vitals:   08/06/17 0716  BP: (!) 165/83  Pulse: 68  Resp: 16  Weight: 198 lb (89.8 kg)  Height: '5\' 4"'$  (1.626 m)    Not recorded      Body mass index is 33.99 kg/m.  PHYSICAL EXAMNIATION:  Gen: NAD, conversant, well nourised, obese, well groomed                     Cardiovascular: Regular rate rhythm, no peripheral edema, warm, nontender. Eyes: Conjunctivae clear without exudates or hemorrhage Neck: Supple, no carotid bruits. Pulmonary: Clear to auscultation bilaterally   NEUROLOGICAL EXAM:  MENTAL STATUS: Depressed looking middle-aged female Speech:    Speech is normal; fluent and spontaneous with normal comprehension.  Cognition:     Orientation to time, place and person     Normal recent and remote memory     Normal Attention span and concentration     Normal Language, naming, repeating,spontaneous speech     Fund of knowledge   CRANIAL NERVES: CN II: Visual fields are full to confrontation. Fundoscopic exam is normal with sharp discs and no vascular changes. Pupils are round equal and briskly reactive to light. CN III, IV, VI: extraocular movement are normal. No ptosis. CN V: Facial sensation is intact to pinprick in all 3 divisions bilaterally. Corneal responses are intact.  CN VII: Face is symmetric  with normal eye closure and smile. CN VIII: Hearing is normal to  rubbing fingers CN IX, X: Palate elevates symmetrically. Phonation is normal. CN XI: Head turning and shoulder shrug are intact CN XII: Tongue is midline with normal movements and no atrophy.  MOTOR: There is no pronator drift of out-stretched arms. Muscle bulk and tone are normal. Muscle strength is normal.  REFLEXES: Reflexes are 2+ and symmetric at the biceps, triceps, knees, and ankles. Plantar responses are flexor.  SENSORY: Intact to light touch, pinprick, positional sensation and vibratory sensation are intact in fingers and toes.  COORDINATION: Rapid alternating movements and fine finger movements are intact. There is no dysmetria on finger-to-nose and heel-knee-shin.    GAIT/STANCE: Posture is normal. Gait is steady with normal steps, base, arm swing, and turning. Heel and toe walking are normal.  Mild difficulty with tandem walking Romberg is absent.   DIAGNOSTIC DATA (LABS, IMAGING, TESTING) - I reviewed patient records, labs, notes, testing and imaging myself where available.   ASSESSMENT AND PLAN  Amy Dean is a 52 y.o. female   Subacute onset meningitis in December 2018  Evidence of perivascular meningeal enhancement, abnormal spinal fluid findings, elevated WBC, total protein, lymphocytic predominant, mild decreased glucose,  But extensive evaluations, including virus PCR, cryptococcal antigen, failed to demonstrate exact etiology  Overall symptoms has much improved,  Repeat MRI of the brain with and without contrast showed no significant abnormality  repeat lumbar puncture also showed improvement, only mild elevated total protein  NSAIDs as needed for headace     Marcial Pacas, M.D. Ph.D.  [facility] [address], [place], [place] [address] Ph: [phone] Fax: [phone]  CC: Kathyrn Drown, MD

## 2017-08-10 ENCOUNTER — Telehealth: Payer: Self-pay | Admitting: Family Medicine

## 2017-08-10 DIAGNOSIS — R911 Solitary pulmonary nodule: Secondary | ICD-10-CM

## 2017-08-10 NOTE — Telephone Encounter (Signed)
Patient said that Dr. Nicki Reaper told her that he wanted her to have some sort of scan done on her lungs because they found a spot.  She wants to know how to go about getting this done?

## 2017-08-13 ENCOUNTER — Other Ambulatory Visit: Payer: Self-pay | Admitting: Family Medicine

## 2017-08-13 NOTE — Telephone Encounter (Signed)
CT chest with out contrast ordered in Epic.

## 2017-08-13 NOTE — Telephone Encounter (Signed)
See tickler file-Patient needs repeat CT Chest without contrast for pulmonary nodule

## 2017-08-13 NOTE — Telephone Encounter (Signed)
Patient stated she would like Thursday pm for her CT scan

## 2017-08-14 MED FILL — VICTOZA 18 MG/3 ML INJECT P: 18 | 45 days supply | Qty: 9 | Fill #0

## 2017-08-15 ENCOUNTER — Telehealth: Payer: Self-pay | Admitting: Family Medicine

## 2017-08-15 NOTE — Telephone Encounter (Signed)
[facility] for pt  Need to notify of appointment for Chest CT scan  Appt 08/23/17 arrive 5:45pm @ [facility]   Call [phone] if need to reschedule

## 2017-08-16 NOTE — Telephone Encounter (Signed)
Pt aware, she rescheduled

## 2017-08-17 ENCOUNTER — Ambulatory Visit (HOSPITAL_COMMUNITY)
Admission: RE | Admit: 2017-08-17 | Discharge: 2017-08-17 | Disposition: A | Discharge status: Home / Self Care | Payer: 59 | Source: Ambulatory Visit | Attending: Family Medicine | Admitting: Family Medicine

## 2017-08-17 DIAGNOSIS — R911 Solitary pulmonary nodule: Secondary | ICD-10-CM | POA: Diagnosis present

## 2017-08-17 DIAGNOSIS — I313 Pericardial effusion (noninflammatory): Secondary | ICD-10-CM | POA: Insufficient documentation

## 2017-08-17 DIAGNOSIS — R918 Other nonspecific abnormal finding of lung field: Secondary | ICD-10-CM | POA: Insufficient documentation

## 2017-08-21 ENCOUNTER — Ambulatory Visit: Payer: 59 | Admitting: Family Medicine

## 2017-08-21 ENCOUNTER — Encounter: Payer: Self-pay | Admitting: Family Medicine

## 2017-08-21 VITALS — BP 134/86 | Ht 64.0 in | Wt 198.6 lb

## 2017-08-21 DIAGNOSIS — J984 Other disorders of lung: Secondary | ICD-10-CM

## 2017-08-21 DIAGNOSIS — E785 Hyperlipidemia, unspecified: Secondary | ICD-10-CM | POA: Diagnosis not present

## 2017-08-21 DIAGNOSIS — E1169 Type 2 diabetes mellitus with other specified complication: Secondary | ICD-10-CM | POA: Diagnosis not present

## 2017-08-21 DIAGNOSIS — E119 Type 2 diabetes mellitus without complications: Secondary | ICD-10-CM

## 2017-08-21 LAB — POCT GLYCOSYLATED HEMOGLOBIN (HGB A1C): Hemoglobin A1C: 5.6

## 2017-08-21 NOTE — Progress Notes (Addendum)
   Subjective:    Patient ID: Amy Dean, female    DOB: July 02, 1965, 52 y.o.   MRN: [id number]   Diabetes   She presents for her follow-up diabetic visit. She has type 2 diabetes mellitus. There are no hypoglycemic associated symptoms. Pertinent negatives for hypoglycemia include no confusion. There are no diabetic associated symptoms. Pertinent negatives for diabetes include no chest pain, no fatigue, no polydipsia, no polyphagia and no weakness. She  has not had a previous visit with a dietitian. She  does not see a podiatrist. Eye exam is not current.    Results for orders placed or performed in visit on 08/21/17  POCT HgB A1C  Result Value Ref Range   Hemoglobin A1C 5.6   Long discussion held today discussing her CAT scan we went over it word by word patient does have what appears to be a benign issue but there is a possibility there could be a underlying small new growth.  More than likely benign.  Was exposed to a lot of smoke as a child and teenager therefore will do a follow-up CT scan in 1 year  Review of Systems  Constitutional: Negative for activity change, appetite change and fatigue.  HENT: Negative for congestion.   Respiratory: Negative for cough.   Cardiovascular: Negative for chest pain.  Gastrointestinal: Negative for abdominal pain.  Endocrine: Negative for polydipsia and polyphagia.  Skin: Negative for color change.  Neurological: Negative for weakness.  Psychiatric/Behavioral: Negative for confusion.       Objective:   Physical Exam  Constitutional: She appears well-developed and well-nourished. No distress.  HENT:  Head: Normocephalic and atraumatic.  Eyes: Right eye exhibits no discharge. Left eye exhibits no discharge.  Neck: No tracheal deviation present.  Cardiovascular: Normal rate, regular rhythm and normal heart sounds.  No murmur heard. Pulmonary/Chest: Effort normal and breath sounds normal. No respiratory distress. She has no wheezes. She  has no rales.  Musculoskeletal: She exhibits no edema.  Lymphadenopathy:    She has no cervical adenopathy.  Neurological: She is alert. She exhibits normal muscle tone.  Skin: Skin is warm and dry. No erythema.  Psychiatric: Her behavior is normal.  Vitals reviewed.         Assessment & Plan:  Hyperlipidemia-check lipid profile Watch diet closely Follow-up 6 months  Continue medication  Diabetes good control with current measures no low sugar spells notify us if any problems continue Victoza  Patient has a small pulmonary nodule this does need to be followed.  More than likely this is benign.  Patient exposed to secondhand smoke throughout her childhood and early teenage years this does put her at slightly increased risk therefore repeat CT scan in 1 years time patient most comfortable with this.

## 2017-08-23 ENCOUNTER — Ambulatory Visit (HOSPITAL_COMMUNITY): Payer: Self-pay

## 2017-08-28 ENCOUNTER — Other Ambulatory Visit: Payer: Self-pay | Admitting: Family Medicine

## 2017-08-28 ENCOUNTER — Other Ambulatory Visit: Payer: Self-pay

## 2017-08-28 MED ORDER — LISINOPRIL-HYDROCHLOROTHIAZIDE 20-12.5 MG PO TABS: ORAL_TABLET | ORAL | 1 refills | Status: DC

## 2017-08-28 MED ORDER — AMLODIPINE BESYLATE 10 MG PO TABS: 10.0000 mg | ORAL_TABLET | Freq: Every day | ORAL | 1 refills | Status: DC

## 2017-08-28 MED FILL — metFORMIN HCL 500 MG TABS: 500 | 90 days supply | Qty: 180 | Fill #0

## 2017-08-28 MED FILL — LISINOPRIL-HCTZ 20-12.5 MG: 20-12.5 | 90 days supply | Qty: 90 | Fill #0

## 2017-08-28 NOTE — Telephone Encounter (Signed)
The Amlodiopine and the Lisinopril have been sent in. The Metformin 500 mg has not. The pt states she is taking 2 po with breakfast. Our med list states she has had this filled by hospital Dr. Marina Gravel

## 2017-08-31 ENCOUNTER — Encounter: Payer: Self-pay | Admitting: Nurse Practitioner

## 2017-08-31 ENCOUNTER — Ambulatory Visit: Payer: 59 | Admitting: Nurse Practitioner

## 2017-08-31 VITALS — BP 142/88 | Ht 64.0 in | Wt 198.2 lb

## 2017-08-31 DIAGNOSIS — R42 Dizziness and giddiness: Secondary | ICD-10-CM

## 2017-08-31 DIAGNOSIS — J309 Allergic rhinitis, unspecified: Secondary | ICD-10-CM

## 2017-08-31 DIAGNOSIS — H1013 Acute atopic conjunctivitis, bilateral: Secondary | ICD-10-CM | POA: Diagnosis not present

## 2017-08-31 MED ORDER — FLUTICASONE PROPIONATE 50 MCG/ACT NA SUSP: 2.0000 | Freq: Every day | NASAL | 5 refills | Status: DC | PRN

## 2017-08-31 NOTE — Progress Notes (Signed)
Subjective:  Presents for a flare up of her vertigo. Began with dizziness and nausea while at work last week. Felt off while sitting then dizziness with standing. Feels like room is spinning. Missed 2 days of work last week.  Last diagnosed with vertigo 09/06/16. Just had MRI by neurology which was normal. Had leftover Meclizine which did not help so tried Dramamine which did help. Symptoms are much improved. Is able to drive and function. No syncope. Denies CP/ischemic type pain or SOB. No difficulty speaking or swallowing. No numbness or weakness of the face, arms or legs. No fever. Itchy watery eyes and sneezing. Very mild headache. Had some slight swelling around right eye which is much better. No sore throat or ear pain. No palpitations.    Objective:   BP (!) 142/88 (Patient Position: Sitting)   Ht 5\' 4"  (1.626 m)   Wt 198 lb 3.2 oz (89.9 kg)   LMP 02/10/2011   BMI 34.02 kg/m  NAD. Alert, oriented. TMs retracted, mainly on left side. Nasal mucosa pale and boggy, more on the left. Pharynx injected with clear PND noted. Neck supple with mild anterior adenopathy. Lungs clear. Heart RRR. Carotids no bruits or thrills. Pupils equal and reactive to light. EOMs intact without nystagmus. Point to point localization normal. Patellar reflexes normal. Gait steady. Romberg neg. Orthostatic BP stable. Conjunctivae mildly injected bilat, more on the right. No significant edema noted.   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 fluticasone (FLONASE) 50 MCG/ACT nasal spray    Sig: Place 2 sprays into both nostrils daily as needed for allergies.    Dispense:  16 g    Refill:  5    Order Specific Question:   Supervising Provider    Answer:   Mikey Kirschner [[id number]]   With symptoms occurring at about the same time as  last year, suspect inner ear related to rhinitis. Restart daily Zyrtec as directed. Continue Dramamine. Warning signs reviewed. Call back next week if no improvement, call or go to ED if worse.

## 2017-08-31 NOTE — Patient Instructions (Addendum)
Continue Dramamine Try Zaditor eye drops (generic) as directed for allergy eyes

## 2017-09-11 ENCOUNTER — Other Ambulatory Visit: Payer: Self-pay | Admitting: *Deleted

## 2017-09-11 MED ORDER — ALPRAZOLAM 0.5 MG PO TABS: ORAL_TABLET | ORAL | 5 refills | Status: DC

## 2017-09-11 MED FILL — ALPRAZolam 0.5 MG TABS: 0.5 | 30 days supply | Qty: 30 | Fill #0

## 2017-09-19 ENCOUNTER — Other Ambulatory Visit: Payer: 59 | Admitting: Obstetrics and Gynecology

## 2017-09-22 DIAGNOSIS — E785 Hyperlipidemia, unspecified: Secondary | ICD-10-CM | POA: Diagnosis not present

## 2017-09-22 DIAGNOSIS — E119 Type 2 diabetes mellitus without complications: Secondary | ICD-10-CM | POA: Diagnosis not present

## 2017-09-22 DIAGNOSIS — E1169 Type 2 diabetes mellitus with other specified complication: Secondary | ICD-10-CM | POA: Diagnosis not present

## 2017-09-23 LAB — MICROALBUMIN / CREATININE URINE RATIO
Creatinine, Urine: 193.6 mg/dL
Microalb/Creat Ratio: 13.8 mg/g creat (ref 0.0–30.0)
Microalbumin, Urine: 26.7 ug/mL

## 2017-09-23 LAB — LIPID PANEL
Chol/HDL Ratio: 3.6 ratio (ref 0.0–4.4)
Cholesterol, Total: 173 mg/dL (ref 100–199)
HDL: 48 mg/dL (ref >39)
LDL Calculated: 105 mg/dL — ABNORMAL HIGH (ref 0–99)
Triglycerides: 99 mg/dL (ref 0–149)
VLDL Cholesterol Cal: 20 mg/dL (ref 5–40)

## 2017-09-24 ENCOUNTER — Other Ambulatory Visit: Payer: Self-pay | Admitting: *Deleted

## 2017-09-24 MED ORDER — PRAVASTATIN SODIUM 80 MG PO TABS: 80.0000 mg | ORAL_TABLET | Freq: Every day | ORAL | 1 refills | Status: DC

## 2017-09-24 MED FILL — PRAVASTATIN SODIUM 80 MG TA: 80 | 90 days supply | Qty: 90 | Fill #0

## 2017-10-29 ENCOUNTER — Other Ambulatory Visit (HOSPITAL_COMMUNITY)
Admission: RE | Admit: 2017-10-29 | Discharge: 2017-10-29 | Disposition: A | Discharge status: Home / Self Care | Payer: 59 | Source: Ambulatory Visit | Attending: Obstetrics & Gynecology | Admitting: Obstetrics & Gynecology

## 2017-10-29 ENCOUNTER — Encounter: Payer: Self-pay | Admitting: Obstetrics & Gynecology

## 2017-10-29 ENCOUNTER — Ambulatory Visit (INDEPENDENT_AMBULATORY_CARE_PROVIDER_SITE_OTHER): Payer: 59 | Admitting: Obstetrics & Gynecology

## 2017-10-29 VITALS — BP 119/70 | HR 78 | Ht 64.0 in | Wt 197.3 lb

## 2017-10-29 DIAGNOSIS — Z1212 Encounter for screening for malignant neoplasm of rectum: Secondary | ICD-10-CM

## 2017-10-29 DIAGNOSIS — Z1211 Encounter for screening for malignant neoplasm of colon: Secondary | ICD-10-CM | POA: Diagnosis not present

## 2017-10-29 DIAGNOSIS — Z01419 Encounter for gynecological examination (general) (routine) without abnormal findings: Secondary | ICD-10-CM | POA: Diagnosis not present

## 2017-10-29 LAB — HEMOCCULT GUIAC POC 1CARD (OFFICE): Fecal Occult Blood, POC: NEGATIVE

## 2017-10-29 NOTE — Progress Notes (Signed)
Subjective:     Amy Dean is a 52 y.o. female here for a routine exam.  Patient's last menstrual period was 02/10/2011. [id number] Birth Control Method:  hysterectomy Menstrual Calendar(currently): amenorrhiec  Current complaints: none.   Current acute medical issues:  none   Recent Gynecologic History Patient's last menstrual period was 02/10/2011. Last Pap: 2015,  normal Last mammogram: 2018,  normal  Past Medical History:  Diagnosis Date  . Anemia    Resolved post hysterectomy  . Enteritis   . Essential hypertension   . Headache   . Hyperlipidemia   . Type 2 diabetes mellitus ([place])     Past Surgical History:  Procedure Laterality Date  . ABDOMINAL HYSTERECTOMY    . [place]   x2  . COLONOSCOPY N/A 04/22/2015   Procedure: COLONOSCOPY;  Surgeon: Rogene Houston, MD;  Location: [facility];  Service: Endoscopy;  Laterality: N/A;  1:00  . EUS  10/26/2011   Procedure: UPPER ENDOSCOPIC ULTRASOUND (EUS) LINEAR;  Surgeon: Milus Banister, MD;  Location: [facility];  Service: Endoscopy;  Laterality: N/A;  . HYSTEROSCOPY W/ ENDOMETRIAL ABLATION  07/22/2008   K.Harrington Challenger MD [facility]  . SUPRACERVICAL ABDOMINAL HYSTERECTOMY  03/21/2011   Procedure: HYSTERECTOMY SUPRACERVICAL ABDOMINAL;  Surgeon: Jonnie Kind, MD;  Location: [facility]S;  Service: Gynecology;;  . TONSILLECTOMY    . TUBAL LIGATION      OB History    Gravida  2   Para  2   Term      Preterm      AB  0   Living  2     SAB  0   TAB      Ectopic      Multiple      Live Births              Social History   Socioeconomic History  . Marital status: Married    Spouse name: Jessenia, Filippone.  . Number of children: 1  . Years of education: 12th grade  . Highest education level: Not on file  Occupational History  . 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 Financial resource strain: Not on file  . Food insecurity:    Worry: Not on file     Inability: Not on file  . Transportation needs:    Medical: Not on file    Non-medical: Not on file  Tobacco Use  . Smoking status: Never Smoker  . Smokeless tobacco: Never Used  Substance and Sexual Activity  . Alcohol use: Yes    Comment: occastionally on holidays; 2 drinks at a time  . Drug use: No  . Sexual activity: Yes    Birth control/protection: Surgical  Lifestyle  . Physical activity:    Days per week: Not on file    Minutes per session: Not on file  . Stress: Not on file  Relationships  . Social connections:    Talks on phone: Not on file    Gets together: Not on file    Attends religious service: Not on file    Active member of club or organization: Not on file    Attends meetings of clubs or organizations: Not on file    Relationship status: Not on file  Other Topics Concern  . Not on file  Social History Narrative   Lives with her husband.   Right-handed.   Occasional soda (1-2 times per week).  Family History  Problem Relation Age of Onset  . Healthy Son   . Sarcoidosis Mother   . Heart attack Mother   . Stroke Mother   . Other Father        unsure of history  . Heart attack Paternal Uncle   . Hypertension Maternal Grandmother   . Anesthesia problems Neg Hx   . Hypotension Neg Hx   . Malignant hyperthermia Neg Hx   . Pseudochol deficiency Neg Hx      Current Outpatient Medications:  .  acetaminophen (TYLENOL) 500 MG tablet, Take 500 mg by mouth every 8 (eight) hours as needed for mild pain. Reported on 07/12/2015, Disp: , Rfl:  .  ALPRAZolam (XANAX) 0.5 MG tablet, TAKE 1 TABLET BY MOUTH AT BEDTIME AS NEEDED FOR SLEEP, Disp: 30 tablet, Rfl: 5 .  amLODipine (NORVASC) 10 MG tablet, Take 1 tablet (10 mg total) by mouth daily., Disp: 90 tablet, Rfl: 1 .  aspirin 81 MG tablet, Take 81 mg by mouth daily., Disp: , Rfl:  .  cetirizine (ZYRTEC) 10 MG tablet, Take 1 tablet (10 mg total) by mouth daily as needed for allergies., Disp: , Rfl:  .   cholecalciferol (VITAMIN D) 1000 units tablet, Take 1,000 Units by mouth daily., Disp: , Rfl:  .  citalopram (CELEXA) 20 MG tablet, TAKE 1 & 1/2 TABLETS BY MOUTH DAILY., Disp: 135 tablet, Rfl: 5 .  fexofenadine (ALLEGRA) 180 MG tablet, Take 1 tablet (180 mg total) by mouth daily., Disp: 90 tablet, Rfl: 1 .  fluticasone (FLONASE) 50 MCG/ACT nasal spray, Place 2 sprays into both nostrils daily as needed for allergies., Disp: 16 g, Rfl: 5 .  Insulin Pen Needle (PEN NEEDLES 31GX5/16") 31G X 8 MM MISC, Use daily as directed, Disp: 100 each, Rfl: 11 .  ketoconazole (NIZORAL) 2 % cream, Apply 1 application topically 3 (three) times a week., Disp: , Rfl:  .  lisinopril-hydrochlorothiazide (PRINZIDE,ZESTORETIC) 20-12.5 MG tablet, TAKE 1 TABLET BY MOUTH DAILY FOR FOR BLOOD PRESSURE, Disp: 90 tablet, Rfl: 1 .  metFORMIN (GLUCOPHAGE) 500 MG tablet, Take 2 po daily with breakfast, Disp: 180 tablet, Rfl: 1 .  omega-3 acid ethyl esters (LOVAZA) 1 g capsule, Take 1 capsule (1 g total) by mouth daily., Disp: , Rfl:  .  pantoprazole (PROTONIX) 40 MG tablet, Take 1 tablet (40 mg total) by mouth daily as needed (acid reflux)., Disp: , Rfl:  .  pravastatin (PRAVACHOL) 80 MG tablet, Take 1 tablet (80 mg total) by mouth daily., Disp: 90 tablet, Rfl: 1 .  VICTOZA 18 MG/3ML SOPN, INJECT 1.2 MG SUBQ ONCE DAILY, Disp: 9 mL, Rfl: 2 .  triamcinolone cream (KENALOG) 0.1 %, Apply 1 application topically 3 (three) times a week. (Patient not taking: Reported on 10/29/2017), Disp: , Rfl:   Review of Systems  Review of Systems  Constitutional: Negative for fever, chills, weight loss, malaise/fatigue and diaphoresis.  HENT: Negative for hearing loss, ear pain, nosebleeds, congestion, sore throat, neck pain, tinnitus and ear discharge.   Eyes: Negative for blurred vision, double vision, photophobia, pain, discharge and redness.  Respiratory: Negative for cough, hemoptysis, sputum production, shortness of breath, wheezing and stridor.    Cardiovascular: Negative for chest pain, palpitations, orthopnea, claudication, leg swelling and PND.  Gastrointestinal: negative for abdominal pain. Negative for heartburn, nausea, vomiting, diarrhea, constipation, blood in stool and melena.  Genitourinary: Negative for dysuria, urgency, frequency, hematuria and flank pain.  Musculoskeletal: Negative for myalgias, back pain, joint pain and falls.  Skin: Negative for itching and rash.  Neurological: Negative for dizziness, tingling, tremors, sensory change, speech change, focal weakness, seizures, loss of consciousness, weakness and headaches.  Endo/Heme/Allergies: Negative for environmental allergies and polydipsia. Does not bruise/bleed easily.  Psychiatric/Behavioral: Negative for depression, suicidal ideas, hallucinations, memory loss and substance abuse. The patient is not nervous/anxious and does not have insomnia.        Objective:  Blood pressure 119/70, pulse 78, height 5\' 4"  (1.626 m), weight 197 lb 4.8 oz (89.5 kg), last menstrual period 02/10/2011.   Physical Exam  Vitals reviewed. Constitutional: She is oriented to person, place, and time. She appears well-developed and well-nourished.  HENT:  Head: Normocephalic and atraumatic.        Right Ear: External ear normal.  Left Ear: External ear normal.  Nose: Nose normal.  Mouth/Throat: Oropharynx is clear and moist.  Eyes: Conjunctivae and EOM are normal. Pupils are equal, round, and reactive to light. Right eye exhibits no discharge. Left eye exhibits no discharge. No scleral icterus.  Neck: Normal range of motion. Neck supple. No tracheal deviation present. No thyromegaly present.  Cardiovascular: Normal rate, regular rhythm, normal heart sounds and intact distal pulses.  Exam reveals no gallop and no friction rub.   No murmur heard. Respiratory: Effort normal and breath sounds normal. No respiratory distress. She has no wheezes. She has no rales. She exhibits no tenderness.   GI: Soft. Bowel sounds are normal. She exhibits no distension and no mass. There is no tenderness. There is no rebound and no guarding.  Genitourinary:  Breasts no masses skin changes or nipple changes bilaterally      Vulva is normal without lesions Vagina is pink moist without discharge Cervix normal in appearance and pap is done Uterus is absent Adnexa is negative with normal sized ovaries  {Rectal    hemoccult negative, normal tone, no masses  Musculoskeletal: Normal range of motion. She exhibits no edema and no tenderness.  Neurological: She is alert and oriented to person, place, and time. She has normal reflexes. She displays normal reflexes. No cranial nerve deficit. She exhibits normal muscle tone. Coordination normal.  Skin: Skin is warm and dry. No rash noted. No erythema. No pallor.  Psychiatric: She has a normal mood and affect. Her behavior is normal. Judgment and thought content normal.       Medications Ordered at today's visit: No orders of the defined types were placed in this encounter.   Other orders placed at today's visit: No orders of the defined types were placed in this encounter.     Assessment:    Healthy female exam.    Plan:    Mammogram ordered. Follow up in: 2 years.     Return in about 2 years (around 10/30/2019) for yearly.

## 2017-10-29 NOTE — Addendum Note (Signed)
Addended by: Gaylyn Rong A on: 10/29/2017 04:56 PM   Modules accepted: Orders

## 2017-10-31 LAB — CYTOLOGY - PAP
Diagnosis: NEGATIVE
HPV: NOT DETECTED

## 2017-11-22 MED FILL — ALPRAZolam 0.5 MG TABS: 0.5 | 30 days supply | Qty: 30 | Fill #1

## 2017-12-10 MED FILL — AMLODIPINE BESYLATE 10 MG T: 10 | 90 days supply | Qty: 90 | Fill #1

## 2017-12-10 MED FILL — VICTOZA 18 MG/3 ML INJECT P: 18 | 45 days supply | Qty: 9 | Fill #1

## 2017-12-10 MED FILL — metFORMIN HCL 500 MG TABS: 500 | 90 days supply | Qty: 180 | Fill #1

## 2017-12-10 MED FILL — LISINOPRIL-HCTZ 20-12.5 MG: 20-12.5 | 90 days supply | Qty: 90 | Fill #1

## 2018-01-16 DIAGNOSIS — E119 Type 2 diabetes mellitus without complications: Secondary | ICD-10-CM | POA: Diagnosis not present

## 2018-01-16 LAB — HM DIABETES EYE EXAM

## 2018-02-08 MED FILL — CITALOPRAM HBR 20 MG TABLET: 20 | 90 days supply | Qty: 135 | Fill #1

## 2018-02-08 MED FILL — PRAVASTATIN SODIUM 80 MG TA: 80 | 90 days supply | Qty: 90 | Fill #1

## 2018-02-08 MED FILL — ALPRAZolam 0.5 MG TABS: 0.5 | 30 days supply | Qty: 30 | Fill #2

## 2018-02-20 ENCOUNTER — Ambulatory Visit: Payer: 59 | Admitting: Family Medicine

## 2018-02-20 ENCOUNTER — Encounter: Payer: Self-pay | Admitting: Family Medicine

## 2018-02-20 VITALS — BP 148/88 | Ht 64.0 in | Wt 202.0 lb

## 2018-02-20 DIAGNOSIS — E1169 Type 2 diabetes mellitus with other specified complication: Secondary | ICD-10-CM

## 2018-02-20 DIAGNOSIS — E119 Type 2 diabetes mellitus without complications: Secondary | ICD-10-CM | POA: Diagnosis not present

## 2018-02-20 DIAGNOSIS — Z79899 Other long term (current) drug therapy: Secondary | ICD-10-CM

## 2018-02-20 DIAGNOSIS — E785 Hyperlipidemia, unspecified: Secondary | ICD-10-CM | POA: Diagnosis not present

## 2018-02-20 DIAGNOSIS — I10 Essential (primary) hypertension: Secondary | ICD-10-CM | POA: Diagnosis not present

## 2018-02-20 LAB — POCT GLYCOSYLATED HEMOGLOBIN (HGB A1C): Hemoglobin A1C: 6 % — AB (ref 4.0–5.6)

## 2018-02-20 MED ORDER — PRAVASTATIN SODIUM 80 MG PO TABS: 80.0000 mg | ORAL_TABLET | Freq: Every day | ORAL | 1 refills | Status: DC

## 2018-02-20 MED ORDER — ALPRAZOLAM 0.5 MG PO TABS: ORAL_TABLET | ORAL | 5 refills | Status: DC

## 2018-02-20 MED ORDER — LISINOPRIL-HYDROCHLOROTHIAZIDE 20-12.5 MG PO TABS: ORAL_TABLET | ORAL | 1 refills | Status: DC

## 2018-02-20 MED ORDER — AMLODIPINE BESYLATE 10 MG PO TABS: 10.0000 mg | ORAL_TABLET | Freq: Every day | ORAL | 1 refills | Status: DC

## 2018-02-20 MED ORDER — METFORMIN HCL 500 MG PO TABS: ORAL_TABLET | ORAL | 1 refills | Status: DC

## 2018-02-20 NOTE — Progress Notes (Signed)
Subjective:    Patient ID: Amy Dean, female    DOB: 25-Aug-1965, 52 y.o.   MRN: [id number]  HPI Patient is here today to follow up on her chronic illness. She is taking Victoza 1.2 mg SQ daily Metformin 500 two in am. She says she does not eat healthy and does not exercise due to her feet hurting. She does not see any specialist. Patient denies being depressed States overall energy level doing well States her sugars have been low a couple different times She is interested in tapering off of the Pitocin She states she could eat better and become more physically active   Review of Systems  Constitutional: Negative for activity change, appetite change and fatigue.  HENT: Negative for congestion and rhinorrhea.   Respiratory: Negative for cough and shortness of breath.   Cardiovascular: Negative for chest pain and leg swelling.  Gastrointestinal: Negative for abdominal pain and diarrhea.  Endocrine: Negative for polydipsia and polyphagia.  Skin: Negative for color change.  Neurological: Negative for dizziness and weakness.  Psychiatric/Behavioral: Negative for behavioral problems and confusion.   Results for orders placed or performed in visit on 02/20/18  POCT glycosylated hemoglobin (Hb A1C)  Result Value Ref Range   Hemoglobin A1C 6.0 (A) 4.0 - 5.6 %   HbA1c POC (<> result, manual entry)     HbA1c, POC (prediabetic range)     HbA1c, POC (controlled diabetic range)         Objective:   Physical Exam  Constitutional: She appears well-nourished. No distress.  HENT:  Head: Normocephalic and atraumatic.  Eyes: Right eye exhibits no discharge. Left eye exhibits no discharge.  Neck: No tracheal deviation present.  Cardiovascular: Normal rate, regular rhythm and normal heart sounds.  No murmur heard. Pulmonary/Chest: Effort normal and breath sounds normal. No respiratory distress.  Musculoskeletal: She exhibits no edema.  Lymphadenopathy:    She has no cervical  adenopathy.  Neurological: She is alert. Coordination normal.  Skin: Skin is warm and dry.  Psychiatric: She has a normal mood and affect. Her behavior is normal.  Vitals reviewed.         Assessment & Plan:  The patient was seen today as part of a comprehensive visit for diabetes. The importance of keeping her A1c at or below 7 was discussed.  Importance of regular physical activity was discussed.   The importance of adherence to medication as well as a controlled low starch/sugar diet was also discussed.  Standard follow-up visit recommended.  Also patient aware failure to keep diabetes under control increases the risk of complications. The patient is requested to come off of Victoza she states she has had some very low blood sugars she agrees to eat healthier and stay more active and she will taper off the Victoza monitor her sugars and notify us if they start going up she will send [place] some readings in several weeks in addition to this she will do lab work toward the end of December and follow-up in the spring  Uses Xanax at nighttime to help her with sleep without it she has difficult time prescription was given  HTN- Patient was seen today as part of a visit regarding hypertension. The importance of healthy diet and regular physical activity was discussed. The importance of compliance with medications discussed.  Ideal goal is to keep blood pressure low elevated levels certainly below 086/57 when possible.  The patient was counseled that keeping blood pressure under control lessen his risk  of complications.  The importance of regular follow-ups was discussed with the patient.  Low-salt diet such as DASH recommended.  Regular physical activity was recommended as well.  Patient was advised to keep regular follow-ups.  The patient was seen today as part of an evaluation regarding hyperlipidemia.  Recent lab work has been reviewed with the patient as well as the goals for good  cholesterol care.  In addition to this medications have been discussed the importance of compliance with diet and medications discussed as well.  Finally the patient is aware that poor control of cholesterol, noncompliance can dramatically increase the risk of complications. The patient will keep regular office visits and the patient does agreed to periodic lab work. Patient will do lab work toward the end of this year previous lipid profile was reviewed  Patient was encouraged to increase physical activity try to get things under better control

## 2018-02-22 MED FILL — FREESTYLE LANCETS: 90 days supply | Qty: 100 | Fill #0

## 2018-02-22 MED FILL — FREESTYLE LITE METER: 1 days supply | Qty: 1 | Fill #0

## 2018-02-22 MED FILL — FREESTYLE LITE TEST STRIP: 90 days supply | Qty: 100 | Fill #0

## 2018-03-29 MED FILL — metFORMIN HCL 500 MG TABS: 500 | 90 days supply | Qty: 180 | Fill #0

## 2018-03-29 MED FILL — ALPRAZolam 0.5 MG TABS: 0.5 | 30 days supply | Qty: 30 | Fill #0

## 2018-03-29 MED FILL — LISINOPRIL-HCTZ 20-12.5 MG: 20-12.5 | 90 days supply | Qty: 90 | Fill #0

## 2018-05-13 ENCOUNTER — Other Ambulatory Visit (HOSPITAL_COMMUNITY)
Admission: RE | Admit: 2018-05-13 | Discharge: 2018-05-13 | Disposition: A | Discharge status: Home / Self Care | Payer: 59 | Source: Ambulatory Visit | Attending: Family Medicine | Admitting: Family Medicine

## 2018-05-13 DIAGNOSIS — E785 Hyperlipidemia, unspecified: Secondary | ICD-10-CM | POA: Insufficient documentation

## 2018-05-13 DIAGNOSIS — Z79899 Other long term (current) drug therapy: Secondary | ICD-10-CM | POA: Diagnosis not present

## 2018-05-13 DIAGNOSIS — E119 Type 2 diabetes mellitus without complications: Secondary | ICD-10-CM | POA: Diagnosis not present

## 2018-05-13 DIAGNOSIS — E1169 Type 2 diabetes mellitus with other specified complication: Secondary | ICD-10-CM | POA: Diagnosis not present

## 2018-05-13 LAB — BASIC METABOLIC PANEL
Anion gap: 9 (ref 5–15)
BUN: 14 mg/dL (ref 6–20)
CO2: 22 mmol/L (ref 22–32)
Calcium: 9.4 mg/dL (ref 8.9–10.3)
Chloride: 107 mmol/L (ref 98–111)
Creatinine, Ser: 0.66 mg/dL (ref 0.44–1.00)
GFR calc Af Amer: >60 mL/min (ref >60)
GFR calc non Af Amer: >60 mL/min (ref >60)
Glucose, Bld: 191 mg/dL — ABNORMAL HIGH (ref 70–99)
Potassium: 4 mmol/L (ref 3.5–5.1)
Sodium: 138 mmol/L (ref 135–145)

## 2018-05-13 LAB — HEPATIC FUNCTION PANEL
ALT: 42 U/L (ref 0–44)
AST: 46 U/L — ABNORMAL HIGH (ref 15–41)
Albumin: 4.2 g/dL (ref 3.5–5.0)
Alkaline Phosphatase: 70 U/L (ref 38–126)
Bilirubin, Direct: <0.1 mg/dL (ref 0.0–0.2)
Total Bilirubin: 0.5 mg/dL (ref 0.3–1.2)
Total Protein: 7.3 g/dL (ref 6.5–8.1)

## 2018-05-13 LAB — LIPID PANEL
Cholesterol: 174 mg/dL (ref 0–200)
HDL: 49 mg/dL (ref >40)
LDL Cholesterol: 96 mg/dL (ref 0–99)
Total CHOL/HDL Ratio: 3.6 RATIO
Triglycerides: 145 mg/dL (ref <150)
VLDL: 29 mg/dL (ref 0–40)

## 2018-05-23 MED FILL — AMLODIPINE BESYLATE 10 MG T: 10 | 90 days supply | Qty: 90 | Fill #0

## 2018-06-05 MED FILL — ALPRAZolam 0.5 MG TABS: 0.5 | 30 days supply | Qty: 30 | Fill #1

## 2018-06-17 ENCOUNTER — Ambulatory Visit: Payer: 59 | Admitting: Family Medicine

## 2018-06-17 ENCOUNTER — Telehealth: Payer: Self-pay | Admitting: Family Medicine

## 2018-06-17 VITALS — BP 128/86 | Wt 205.2 lb

## 2018-06-17 DIAGNOSIS — Z79899 Other long term (current) drug therapy: Secondary | ICD-10-CM | POA: Diagnosis not present

## 2018-06-17 DIAGNOSIS — E119 Type 2 diabetes mellitus without complications: Secondary | ICD-10-CM | POA: Diagnosis not present

## 2018-06-17 DIAGNOSIS — R002 Palpitations: Secondary | ICD-10-CM

## 2018-06-17 DIAGNOSIS — E1169 Type 2 diabetes mellitus with other specified complication: Secondary | ICD-10-CM

## 2018-06-17 DIAGNOSIS — R0609 Other forms of dyspnea: Secondary | ICD-10-CM | POA: Diagnosis not present

## 2018-06-17 DIAGNOSIS — R0600 Dyspnea, unspecified: Secondary | ICD-10-CM

## 2018-06-17 DIAGNOSIS — E785 Hyperlipidemia, unspecified: Secondary | ICD-10-CM | POA: Diagnosis not present

## 2018-06-17 LAB — POCT GLYCOSYLATED HEMOGLOBIN (HGB A1C): Hemoglobin A1C: 6 % — AB (ref 4.0–5.6)

## 2018-06-17 MED ORDER — AMLODIPINE BESYLATE 10 MG PO TABS: 10.0000 mg | ORAL_TABLET | Freq: Every day | ORAL | 1 refills | Status: DC

## 2018-06-17 MED ORDER — PANTOPRAZOLE SODIUM 40 MG PO TBEC: 40.0000 mg | DELAYED_RELEASE_TABLET | Freq: Every day | ORAL | 1 refills | Status: DC | PRN

## 2018-06-17 MED ORDER — METFORMIN HCL 500 MG PO TABS: ORAL_TABLET | ORAL | 1 refills | Status: DC

## 2018-06-17 MED ORDER — LISINOPRIL-HYDROCHLOROTHIAZIDE 20-12.5 MG PO TABS: ORAL_TABLET | ORAL | 1 refills | Status: DC

## 2018-06-17 MED ORDER — ROSUVASTATIN CALCIUM 40 MG PO TABS: 40.0000 mg | ORAL_TABLET | Freq: Every day | ORAL | 1 refills | Status: DC

## 2018-06-17 MED ORDER — LIRAGLUTIDE 18 MG/3ML ~~LOC~~ SOPN: PEN_INJECTOR | SUBCUTANEOUS | 1 refills | Status: DC

## 2018-06-17 MED ORDER — CITALOPRAM HYDROBROMIDE 20 MG PO TABS: 30.0000 mg | ORAL_TABLET | Freq: Every day | ORAL | 1 refills | Status: DC

## 2018-06-17 MED FILL — CITALOPRAM HBR 20 MG TABLET: 20 | 90 days supply | Qty: 135 | Fill #0

## 2018-06-17 MED FILL — VICTOZA 2-PAK 18 MG/3 ML PE: 18 | 30 days supply | Qty: 6 | Fill #0

## 2018-06-17 MED FILL — LISINOPRIL-HCTZ 20-12.5 MG: 20-12.5 | 90 days supply | Qty: 90 | Fill #0

## 2018-06-17 MED FILL — ROSUVASTATIN CALCIUM 40 MG: 40 | 90 days supply | Qty: 90 | Fill #0

## 2018-06-17 MED FILL — metFORMIN HCL 500 MG TABS: 500 | 90 days supply | Qty: 180 | Fill #0

## 2018-06-17 NOTE — Telephone Encounter (Signed)
Pt returned call. Pt states that she had just got off phone with pharmacy and they needed verification from doctor office to dispense generic. Contacted [facility] and spoke with [facility]. Jinny Blossom states that they filled the generic and patient has already picked it up.

## 2018-06-17 NOTE — Progress Notes (Signed)
Subjective:    Patient ID: Amy Dean, female    DOB: 1966-04-23, 53 y.o.   MRN: [id number]  Diabetes  She presents for her initial diabetic visit. She has type 2 diabetes mellitus. Pertinent negatives for hypoglycemia include no confusion or dizziness. Pertinent negatives for diabetes include no chest pain, no fatigue, no polydipsia, no polyphagia and no weakness. Risk factors for coronary artery disease include diabetes mellitus, dyslipidemia, hypertension and post-menopausal. Current diabetic treatment includes oral agent (monotherapy) (victoza). Her weight is stable. She is following a diabetic diet.   Results for orders placed or performed in visit on 06/17/18  POCT glycosylated hemoglobin (Hb A1C)  Result Value Ref Range   Hemoglobin A1C 6.0 (A) 4.0 - 5.6 %   HbA1c POC (<> result, manual entry)     HbA1c, POC (prediabetic range)     HbA1c, POC (controlled diabetic range)     Patient here for follow-up regarding cholesterol.  The patient does have hyperlipidemia.  Patient does try to maintain a reasonable diet.  Patient does take the medication on a regular basis.  Denies missing a dose.  The patient denies any obvious side effects.  Prior blood work results reviewed with the patient.  The patient is aware of his cholesterol goals and the need to keep it under good control to lessen the risk of disease.  We talked at length about palpitation she had last night had multiple small some spells of palpitations denied any chest tightness pressure pain related some shortness of breath over the past week plus also some shortness of breath today walking from the hospital to our office no chest pressure  Review of Systems  Constitutional: Negative for activity change, appetite change and fatigue.  HENT: Negative for congestion and rhinorrhea.   Respiratory: Negative for cough and shortness of breath.   Cardiovascular: Negative for chest pain and leg swelling.  Gastrointestinal:  Negative for abdominal pain and diarrhea.  Endocrine: Negative for polydipsia and polyphagia.  Skin: Negative for color change.  Neurological: Negative for dizziness and weakness.  Psychiatric/Behavioral: Negative for behavioral problems and confusion.        Objective:   Physical Exam Vitals signs reviewed.  Constitutional:      General: She is not in acute distress. HENT:     Head: Normocephalic and atraumatic.  Eyes:     General:        Right eye: No discharge.        Left eye: No discharge.  Neck:     Trachea: No tracheal deviation.  Cardiovascular:     Rate and Rhythm: Normal rate and regular rhythm.     Heart sounds: Normal heart sounds. No murmur.  Pulmonary:     Effort: Pulmonary effort is normal. No respiratory distress.     Breath sounds: Normal breath sounds.  Lymphadenopathy:     Cervical: No cervical adenopathy.  Skin:    General: Skin is warm and dry.  Neurological:     Mental Status: She is alert.     Coordination: Coordination normal.  Psychiatric:        Behavior: Behavior normal.           Assessment & Plan:  Diabetes overall fairly good control watch diet closely check sugars intermittently send [place] readings continue current medications  Mild obesity the importance of dietary restrictions healthy eating regular physical activity discussed patient join weight watchers hopefully this will help  Hyperlipidemia subpar control switch to Crestor recheck lab  work again in a few months follow-up in 6 months  DOE with palpitations last night patient has had some shortness of breath with activity over the past month had palpitations last night no true substernal pressure EKG no acute changes referral to cardiology for further evaluation and work-up  25 minutes was spent with the patient.  This statement verifies that 25 minutes was indeed spent with the patient.  More than 50% of this visit-total duration of the visit-was spent in counseling and  coordination of care. The issues that the patient came in for today as reflected in the diagnosis (s) please refer to documentation for further details.

## 2018-06-17 NOTE — Telephone Encounter (Signed)
Left message to return call. Generic was called in.

## 2018-06-17 NOTE — Telephone Encounter (Signed)
Pt is checking on cholesterol medication. The brand name was sent in and she is hoping it can be switched to the generic.   Pharmacy [facility] - [place], [place].

## 2018-06-18 ENCOUNTER — Encounter: Payer: Self-pay | Admitting: Family Medicine

## 2018-06-19 ENCOUNTER — Telehealth: Payer: Self-pay | Admitting: Family Medicine

## 2018-06-19 NOTE — Telephone Encounter (Signed)
Patient would like to know if Dr.Scott recommends patient to continue amLODipine (NORVASC) 10 MG tablet and rosuvastatin (CRESTOR) 40 MG tablet or does Dr.Scott recommend D/C one. Advise.

## 2018-06-20 NOTE — Telephone Encounter (Signed)
So when the patient was seen the other day we did a evaluation regarding blood pressure diabetes I recommended Crestor/generic of it in order to get her cholesterol under better control I also increased the dose of her amlodipine to 10 mg instead of 5 mg to get blood pressure under better control Please talk with the patient I am not sure if there is some confusion going on here regarding medications If additional questions please let me know

## 2018-06-20 NOTE — Telephone Encounter (Signed)
Left message to return call 

## 2018-06-21 NOTE — Telephone Encounter (Signed)
Spoke with patient. Pt verbalized understanding  °

## 2018-07-09 ENCOUNTER — Other Ambulatory Visit: Payer: Self-pay | Admitting: Family Medicine

## 2018-07-10 MED FILL — OMEGA-3 ETHYL ESTER 1 GM CA: 1 | 90 days supply | Qty: 180 | Fill #0

## 2018-07-10 NOTE — Progress Notes (Signed)
Cardiology Office Note  Date: 07/11/2018   ID: Amy Dean, DOB [birth year], MRN [id number]  PCP: Kathyrn Drown, MD  Consulting Cardiologist: Rozann Lesches, MD   Chief Complaint  Patient presents with  . Palpitations    History of Present Illness: Amy Dean is a 53 y.o. female referred for cardiology consultation with Dr. Wolfgang Phoenix for the evaluation of palpitations and shortness of breath.  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  She tells me that for the last several months she has noticed dyspnea on exertion and fatigue, not specifically chest tightness however.  This has been with typical activities and ADLs that previously did not cause her to be short of breath.  Unrelated to her breathing status, she has also experienced a feeling of forceful palpitations that have occurred mainly at nighttime.  This does not occur every day.  Feels short of breath with this and also a sense of orthopnea at times.  She has had no syncope.  I reviewed her medical history which is outlined below.  Cardiac risk factors include hypertension, hyperlipidemia, and type 2 diabetes mellitus.  She is a non-smoker.  She reports compliance with her medications and no obvious intolerances.  I reviewed her most recent lab work.  I also reviewed her recent ECG from February.  Past Medical History:  Diagnosis Date  . Anemia    Resolved post hysterectomy  . Enteritis   . Essential hypertension   . Headache   . Hyperlipidemia   . Type 2 diabetes mellitus ([place])     Past Surgical History:  Procedure Laterality Date  . ABDOMINAL HYSTERECTOMY    . [place]   x2  . COLONOSCOPY N/A 04/22/2015   Procedure: COLONOSCOPY;  Surgeon: Rogene Houston, MD;  Location: [facility];  Service: Endoscopy;  Laterality: N/A;  1:00  . EUS  10/26/2011   Procedure: UPPER ENDOSCOPIC ULTRASOUND (EUS) LINEAR;  Surgeon: Milus Banister, MD;   Location: [facility] ENDOSCOPY;  Service: Endoscopy;  Laterality: N/A;  . HYSTEROSCOPY W/ ENDOMETRIAL ABLATION  07/22/2008   K.Harrington Challenger MD [facility]  . SUPRACERVICAL ABDOMINAL HYSTERECTOMY  03/21/2011   Procedure: HYSTERECTOMY SUPRACERVICAL ABDOMINAL;  Surgeon: Jonnie Kind, MD;  Location: [facility] ORS;  Service: Gynecology;;  . TONSILLECTOMY    . TUBAL LIGATION      Current Outpatient Medications  Medication Sig Dispense Refill  . acetaminophen (TYLENOL) 500 MG tablet Take 500 mg by mouth every 8 (eight) hours as needed for mild pain. Reported on 07/12/2015    . ALPRAZolam (XANAX) 0.5 MG tablet TAKE 1 TABLET BY MOUTH AT BEDTIME AS NEEDED FOR SLEEP 30 tablet 5  . amLODipine (NORVASC) 10 MG tablet Take 1 tablet (10 mg total) by mouth daily. 90 tablet 1  . aspirin 81 MG tablet Take 81 mg by mouth daily.    . cetirizine (ZYRTEC) 10 MG tablet Take 1 tablet (10 mg total) by mouth daily as needed for allergies.    . cholecalciferol (VITAMIN D) 1000 units tablet Take 1,000 Units by mouth daily.    . citalopram (CELEXA) 20 MG tablet Take 1.5 tablets (30 mg total) by mouth daily. 135 tablet 1  . fexofenadine (ALLEGRA) 180 MG tablet Take 1 tablet (180 mg total) by mouth daily. 90 tablet 1  . fluticasone (FLONASE) 50 MCG/ACT nasal spray Place 2 sprays into both nostrils daily as needed for allergies. 16 g 5  .  Insulin Pen Needle (PEN NEEDLES 31GX5/16") 31G X 8 MM MISC Use daily as directed 100 each 11  . ketoconazole (NIZORAL) 2 % cream Apply 1 application topically 3 (three) times a week.    . liraglutide (VICTOZA) 18 MG/3ML SOPN 1.2  daily 3 pen 1  . lisinopril-hydrochlorothiazide (PRINZIDE,ZESTORETIC) 20-12.5 MG tablet TAKE 1 TABLET BY MOUTH DAILY FOR FOR BLOOD PRESSURE 90 tablet 1  . metFORMIN (GLUCOPHAGE) 500 MG tablet Take 2 po daily with breakfast 180 tablet 1  . omega-3 acid ethyl esters (LOVAZA) 1 g capsule TAKE 1 CAPSULE BY MOUTH 2 TIMES DAILY. 180 capsule 1  . pantoprazole (PROTONIX) 40 MG tablet Take 1 tablet  (40 mg total) by mouth daily as needed (acid reflux). 90 tablet 1  . rosuvastatin (CRESTOR) 40 MG tablet Take 1 tablet (40 mg total) by mouth daily. 90 tablet 1  . triamcinolone cream (KENALOG) 0.1 % Apply 1 application topically 3 (three) times a week.     No current facility-administered medications for this visit.    Allergies:  Patient has no known allergies.   Social History: The patient  reports that she has never smoked. She has never used smokeless tobacco. She reports current alcohol use. She reports that she does not use drugs.   Family History: The patient's family history includes Healthy in her son; Heart attack in her mother and paternal uncle; Hypertension in her maternal grandmother; Other in her father; Sarcoidosis in her mother; Stroke in her mother.   ROS:  Please see the history of present illness. Otherwise, complete review of systems is positive for none.  All other systems are reviewed and negative.   Physical Exam: VS:  BP (!) 148/78 (BP Location: Left Arm)   Pulse 94   Ht 5\' 4"  (1.626 m)   Wt 205 lb (93 kg)   LMP 02/10/2011   SpO2 97%   BMI 35.19 kg/m , BMI Body mass index is 35.19 kg/m.  Wt Readings from Last 3 Encounters:  07/11/18 205 lb (93 kg)  06/17/18 205 lb 3.2 oz (93.1 kg)  05/12/17 200 lb (90.7 kg)    General: Patient appears comfortable at rest. HEENT: Conjunctiva and lids normal, oropharynx clear. Neck: Supple, no elevated JVP or carotid bruits, no thyromegaly. Lungs: Clear to auscultation, nonlabored breathing at rest. Cardiac: Regular rate and rhythm, questionable soft S3, no significant systolic murmur, no pericardial rub. Abdomen: Soft, nontender, bowel sounds present, no guarding or rebound. Extremities: No pitting edema, distal pulses 2+. Skin: Warm and dry. Musculoskeletal: No kyphosis. Neuropsychiatric: Alert and oriented x3, affect grossly appropriate.  ECG: I personally reviewed the tracing from 06/17/2018 which showed normal  sinus rhythm with leftward axis.  Recent Labwork: 05/13/2018: ALT 42; AST 46; BUN 14; Creatinine, Ser 0.66; Potassium 4.0; Sodium 138     Component Value Date/Time   CHOL 174 05/13/2018 0840   CHOL 173 09/22/2017 0904   TRIG 145 05/13/2018 0840   HDL 49 05/13/2018 0840   HDL 48 09/22/2017 0904   CHOLHDL 3.6 05/13/2018 0840   VLDL 29 05/13/2018 0840   LDLCALC 96 05/13/2018 0840   LDLCALC 105 (H) 09/22/2017 0904    Other Studies Reviewed Today:  Chest CT 08/17/2017: IMPRESSION: 1. The opacity in the right base described previously appears more indicative of scarring than nodular lesion at this time. There is bibasilar scarring. No edema or consolidation.  2. 4 x 3 mm somewhat nodular appearing opacity anterior segment left upper lobe as described above. This  finding was not appreciable on recent prior study. This area may represent localized atelectasis as opposed to an actual nodular lesion. No follow-up needed if patient is low-risk. Non-contrast chest CT can be considered in 12 months if patient is high-risk. This recommendation follows the consensus statement: Guidelines for Management of Incidental Pulmonary Nodules Detected on CT Images: From the Fleischner Society 2017; Radiology 2017; 284:228-243.  3. No evident adenopathy by size criteria. Subcentimeter mediastinal lymph nodes noted.  4.  Rather minimal pericardial effusion noted.  Assessment and Plan:  1.  Dyspnea on exertion and fatigue as outlined above.  Questionable soft S3 on examination although lungs are clear.  I reviewed her recent ECG which showed sinus rhythm with leftward axis.  She does have cardiac risk factors including type 2 diabetes mellitus, hypertension, and hyperlipidemia.  We will begin evaluation with an echocardiogram to assess cardiac structure and function and then consider ischemic evaluation from there.  2.  Nocturnal palpitations, no syncope.  7-day event recorder will be  obtained.  3.  Essential hypertension, blood pressure is elevated somewhat today.  She reports compliance with medical therapy including Norvasc and Prinzide.  4.  Mixed hyperlipidemia, on Crestor.  Recent LDL 96.  Current medicines were reviewed with the patient today.   Orders Placed This Encounter  Procedures  . Cardiac event monitor  . ECHOCARDIOGRAM COMPLETE    Disposition: Call with test results and determine next step.  Signed, Satira Sark, MD, [facility] 07/11/2018 11:05 AM    [facility] [place]. [address], [place],  [address] Phone: [phone]; Fax: [phone]

## 2018-07-11 ENCOUNTER — Encounter (INDEPENDENT_AMBULATORY_CARE_PROVIDER_SITE_OTHER): Payer: 59

## 2018-07-11 ENCOUNTER — Ambulatory Visit: Payer: 59 | Admitting: Cardiology

## 2018-07-11 ENCOUNTER — Encounter: Payer: Self-pay | Admitting: Cardiology

## 2018-07-11 VITALS — BP 148/78 | HR 94 | Ht 64.0 in | Wt 205.0 lb

## 2018-07-11 DIAGNOSIS — R0609 Other forms of dyspnea: Secondary | ICD-10-CM | POA: Diagnosis not present

## 2018-07-11 DIAGNOSIS — I10 Essential (primary) hypertension: Secondary | ICD-10-CM | POA: Diagnosis not present

## 2018-07-11 DIAGNOSIS — E782 Mixed hyperlipidemia: Secondary | ICD-10-CM | POA: Diagnosis not present

## 2018-07-11 DIAGNOSIS — R002 Palpitations: Secondary | ICD-10-CM

## 2018-07-11 DIAGNOSIS — R0600 Dyspnea, unspecified: Secondary | ICD-10-CM

## 2018-07-11 NOTE — Patient Instructions (Signed)
Medication Instructions: Your physician recommends that you continue on your current medications as directed. Please refer to the Current Medication list given to you today.   Labwork: None today  Procedures/Testing: Your physician has requested that you have an echocardiogram. Echocardiography is a painless test that uses sound waves to create images of your heart. It provides your doctor with information about the size and shape of your heart and how well your heart's chambers and valves are working. This procedure takes approximately one hour. There are no restrictions for this procedure.  Your physician has recommended that you wear an event monitor for 7 days. Event monitors are medical devices that record the heart's electrical activity. Doctors most often [place] these monitors to diagnose arrhythmias. Arrhythmias are problems with the speed or rhythm of the heartbeat. The monitor is a small, portable device. You can wear one while you do your normal daily activities. This is usually used to diagnose what is causing palpitations/syncope (passing out).    Follow-Up: To be determined after tests  Any Additional Special Instructions Will Be Listed Below (If Applicable).     If you need a refill on your cardiac medications before your next appointment, please call your pharmacy.

## 2018-07-15 ENCOUNTER — Ambulatory Visit (HOSPITAL_COMMUNITY)
Admission: RE | Admit: 2018-07-15 | Discharge: 2018-07-15 | Disposition: A | Discharge status: Home / Self Care | Payer: 59 | Source: Ambulatory Visit | Attending: Cardiology | Admitting: Cardiology

## 2018-07-15 DIAGNOSIS — R0609 Other forms of dyspnea: Secondary | ICD-10-CM | POA: Insufficient documentation

## 2018-07-15 DIAGNOSIS — R0600 Dyspnea, unspecified: Secondary | ICD-10-CM

## 2018-07-15 NOTE — Progress Notes (Signed)
*  PRELIMINARY RESULTS* Echocardiogram 2D Echocardiogram has been performed.  Amy Dean 07/15/2018, 3:45 PM

## 2018-07-17 ENCOUNTER — Telehealth: Payer: Self-pay | Admitting: Cardiology

## 2018-07-17 NOTE — Telephone Encounter (Signed)
Called pt. NO answer, left message for pt to return call.

## 2018-07-17 NOTE — Telephone Encounter (Signed)
Please give pt a call for results of her echo

## 2018-07-18 ENCOUNTER — Telehealth: Payer: Self-pay

## 2018-07-18 DIAGNOSIS — I519 Heart disease, unspecified: Secondary | ICD-10-CM

## 2018-07-18 NOTE — Telephone Encounter (Signed)
LMTCB-cc 

## 2018-07-18 NOTE — Telephone Encounter (Signed)
-----   Message from Satira Sark, MD sent at 07/16/2018 10:33 AM EDT ----- Results reviewed.  LVEF low normal at 50% with mild LVH and mild diastolic dysfunction.  No regional wall motion abnormalities.  Overall reassuring findings, I would suggest that we go ahead and proceed with a Lexiscan Myoview to evaluate for ischemic heart disease. A copy of this test should be forwarded to Kathyrn Drown, MD.

## 2018-07-19 NOTE — Telephone Encounter (Signed)
Returned pt call. No answer, no voicemail.  

## 2018-07-19 NOTE — Telephone Encounter (Signed)
Returning call to [facility] for results

## 2018-07-19 NOTE — Telephone Encounter (Signed)
[id number] 

## 2018-07-23 NOTE — Telephone Encounter (Signed)
I spoke with patient, results of echo given.Will schedule lexiscan

## 2018-07-23 NOTE — Telephone Encounter (Signed)
-----   Message from Satira Sark, MD sent at 07/16/2018 10:33 AM EDT ----- Results reviewed.  LVEF low normal at 50% with mild LVH and mild diastolic dysfunction.  No regional wall motion abnormalities.  Overall reassuring findings, I would suggest that we go ahead and proceed with a Lexiscan Myoview to evaluate for ischemic heart disease. A copy of this test should be forwarded to Kathyrn Drown, MD.

## 2018-07-24 ENCOUNTER — Other Ambulatory Visit: Payer: Self-pay | Admitting: *Deleted

## 2018-07-24 ENCOUNTER — Other Ambulatory Visit: Payer: Self-pay

## 2018-07-26 MED FILL — ALPRAZolam 0.5 MG TABS: 0.5 | 30 days supply | Qty: 30 | Fill #2

## 2018-07-31 ENCOUNTER — Other Ambulatory Visit: Payer: Self-pay | Admitting: *Deleted

## 2018-07-31 DIAGNOSIS — R911 Solitary pulmonary nodule: Secondary | ICD-10-CM

## 2018-07-31 DIAGNOSIS — R918 Other nonspecific abnormal finding of lung field: Secondary | ICD-10-CM

## 2018-08-12 ENCOUNTER — Encounter (HOSPITAL_COMMUNITY): Payer: Self-pay

## 2018-08-12 ENCOUNTER — Other Ambulatory Visit (HOSPITAL_COMMUNITY): Payer: Self-pay

## 2018-08-22 ENCOUNTER — Ambulatory Visit: Payer: 59 | Admitting: Family Medicine

## 2018-09-02 ENCOUNTER — Encounter (HOSPITAL_COMMUNITY): Payer: 59

## 2018-09-02 ENCOUNTER — Encounter (HOSPITAL_COMMUNITY): Payer: Self-pay

## 2018-09-18 ENCOUNTER — Ambulatory Visit (HOSPITAL_COMMUNITY)
Admission: RE | Admit: 2018-09-18 | Discharge: 2018-09-18 | Disposition: A | Discharge status: Home / Self Care | Payer: 59 | Source: Ambulatory Visit | Attending: Family Medicine | Admitting: Family Medicine

## 2018-09-18 ENCOUNTER — Other Ambulatory Visit: Payer: Self-pay

## 2018-09-18 DIAGNOSIS — R911 Solitary pulmonary nodule: Secondary | ICD-10-CM | POA: Diagnosis not present

## 2018-09-18 DIAGNOSIS — R918 Other nonspecific abnormal finding of lung field: Secondary | ICD-10-CM | POA: Insufficient documentation

## 2018-09-19 ENCOUNTER — Other Ambulatory Visit: Payer: Self-pay | Admitting: Family Medicine

## 2018-09-19 MED FILL — ROSUVASTATIN CALCIUM 40 MG: 40 | 90 days supply | Qty: 90 | Fill #0

## 2018-09-19 MED FILL — VICTOZA 18 MG/3 ML INJECT P: 18 | 30 days supply | Qty: 6 | Fill #0

## 2018-09-19 MED FILL — AMLODIPINE BESYLATE 10 MG T: 10 | 90 days supply | Qty: 90 | Fill #0

## 2018-09-21 MED FILL — ALPRAZolam 0.5 MG TABS: 0.5 | 30 days supply | Qty: 30 | Fill #0

## 2018-09-26 ENCOUNTER — Other Ambulatory Visit (HOSPITAL_COMMUNITY)
Admission: RE | Admit: 2018-09-26 | Discharge: 2018-09-26 | Disposition: A | Discharge status: Home / Self Care | Payer: 59 | Source: Ambulatory Visit | Attending: Family Medicine | Admitting: Family Medicine

## 2018-09-26 DIAGNOSIS — Z79899 Other long term (current) drug therapy: Secondary | ICD-10-CM | POA: Insufficient documentation

## 2018-09-26 DIAGNOSIS — E119 Type 2 diabetes mellitus without complications: Secondary | ICD-10-CM | POA: Insufficient documentation

## 2018-09-26 DIAGNOSIS — E1169 Type 2 diabetes mellitus with other specified complication: Secondary | ICD-10-CM | POA: Diagnosis not present

## 2018-09-26 LAB — LIPID PANEL
Cholesterol: 124 mg/dL (ref 0–200)
HDL: 51 mg/dL (ref >40)
LDL Cholesterol: 40 mg/dL (ref 0–99)
Total CHOL/HDL Ratio: 2.4 RATIO
Triglycerides: 166 mg/dL — ABNORMAL HIGH (ref <150)
VLDL: 33 mg/dL (ref 0–40)

## 2018-09-26 LAB — HEMOGLOBIN A1C
Hgb A1c MFr Bld: 7 % — ABNORMAL HIGH (ref 4.8–5.6)
Mean Plasma Glucose: 154.2 mg/dL

## 2018-09-26 LAB — HEPATIC FUNCTION PANEL
ALT: 66 U/L — ABNORMAL HIGH (ref 0–44)
AST: 48 U/L — ABNORMAL HIGH (ref 15–41)
Albumin: 4.4 g/dL (ref 3.5–5.0)
Alkaline Phosphatase: 78 U/L (ref 38–126)
Bilirubin, Direct: 0.1 mg/dL (ref 0.0–0.2)
Indirect Bilirubin: 0.6 mg/dL (ref 0.3–0.9)
Total Bilirubin: 0.7 mg/dL (ref 0.3–1.2)
Total Protein: 7.4 g/dL (ref 6.5–8.1)

## 2018-09-27 LAB — MICROALBUMIN / CREATININE URINE RATIO
Creatinine, Urine: 56.2 mg/dL
Microalb Creat Ratio: 12 mg/g creat (ref 0–29)
Microalb, Ur: 6.5 ug/mL — ABNORMAL HIGH

## 2018-09-30 ENCOUNTER — Encounter (HOSPITAL_COMMUNITY): Payer: Self-pay

## 2018-10-01 ENCOUNTER — Other Ambulatory Visit: Payer: Self-pay

## 2018-10-01 ENCOUNTER — Encounter (HOSPITAL_COMMUNITY): Payer: 59

## 2018-10-01 ENCOUNTER — Encounter (HOSPITAL_COMMUNITY)
Admission: RE | Admit: 2018-10-01 | Discharge: 2018-10-01 | Disposition: A | Discharge status: Home / Self Care | Payer: 59 | Source: Ambulatory Visit | Attending: Cardiology | Admitting: Cardiology

## 2018-10-01 ENCOUNTER — Encounter (HOSPITAL_COMMUNITY): Payer: Self-pay

## 2018-10-01 DIAGNOSIS — I519 Heart disease, unspecified: Secondary | ICD-10-CM | POA: Diagnosis not present

## 2018-10-01 DIAGNOSIS — I5189 Other ill-defined heart diseases: Secondary | ICD-10-CM | POA: Diagnosis not present

## 2018-10-01 LAB — NM MYOCAR MULTI W/SPECT W/WALL MOTION / EF
Peak HR: 96 {beats}/min
Rest HR: 68 {beats}/min

## 2018-10-01 MED ORDER — SODIUM CHLORIDE 0.9% FLUSH
INTRAVENOUS | Status: AC
  Administered 2018-10-01: 11:00:00 10 mL via INTRAVENOUS
  Filled 2018-10-01: qty 10

## 2018-10-01 MED ORDER — REGADENOSON 0.4 MG/5ML IV SOLN
INTRAVENOUS | Status: AC
  Administered 2018-10-01: 11:00:00 0.4 mg via INTRAVENOUS
  Filled 2018-10-01: qty 5

## 2018-10-01 MED ORDER — TECHNETIUM TC 99M TETROFOSMIN IV KIT
30.0000 | PACK | Freq: Once | INTRAVENOUS | Status: AC | PRN
  Administered 2018-10-01: 33 via INTRAVENOUS

## 2018-10-01 MED ORDER — TECHNETIUM TC 99M TETROFOSMIN IV KIT
10.0000 | PACK | Freq: Once | INTRAVENOUS | Status: AC | PRN
  Administered 2018-10-01: 09:00:00 11 via INTRAVENOUS

## 2018-10-03 MED FILL — PANTOPRAZOLE SOD DR 40 MG T: 40 | 90 days supply | Qty: 90 | Fill #0

## 2018-10-23 ENCOUNTER — Telehealth: Payer: Self-pay

## 2018-10-23 NOTE — Telephone Encounter (Signed)
-----   Message from Satira Sark, MD sent at 10/01/2018  3:58 PM EDT ----- Results reviewed.  Overall results more reflective of a low risk study without large ischemic territory.  LVEF 48% is similar to echocardiogram at around 50%, unclear that this is necessarily symptom provoking.  Check and see how she is doing in terms of dyspnea on exertion.  If pattern has increased, we may need to ultimately consider a more anatomical study such as coronary CTA or catheterization depending on the situation.  If she is doing better, would continue to follow with PCP and refer back necessary. A copy of this test should be forwarded to Kathyrn Drown, MD.

## 2018-10-23 NOTE — Telephone Encounter (Signed)
I spoke with patient, she says she still has some DOE but it is better. She will follow up with her pcp and call us back if needed

## 2018-11-04 ENCOUNTER — Other Ambulatory Visit: Payer: Self-pay

## 2018-11-04 DIAGNOSIS — Z20822 Contact with and (suspected) exposure to covid-19: Secondary | ICD-10-CM

## 2018-11-04 DIAGNOSIS — R6889 Other general symptoms and signs: Secondary | ICD-10-CM | POA: Diagnosis not present

## 2018-11-07 LAB — NOVEL CORONAVIRUS, NAA: SARS-CoV-2, NAA: NOT DETECTED

## 2018-11-21 MED FILL — ALPRAZolam 0.5 MG TABS: 0.5 | 30 days supply | Qty: 30 | Fill #0

## 2018-11-21 MED FILL — VICTOZA 2-PAK 18 MG/3 ML PE: 18 | 30 days supply | Qty: 6 | Fill #0

## 2018-12-13 ENCOUNTER — Other Ambulatory Visit: Payer: Self-pay | Admitting: Family Medicine

## 2018-12-13 MED FILL — OMEGA-3 ETHYL ESTER 1 GM CA: 1 | 90 days supply | Qty: 180 | Fill #1

## 2018-12-13 MED FILL — CITALOPRAM HBR 20 MG TABLET: 20 | 90 days supply | Qty: 135 | Fill #1

## 2018-12-13 MED FILL — LISINOPRIL-HCTZ 20-12.5 MG: 20-12.5 | 90 days supply | Qty: 90 | Fill #1

## 2018-12-13 MED FILL — AMLODIPINE BESYLATE 10 MG T: 10 | 90 days supply | Qty: 90 | Fill #0

## 2018-12-13 MED FILL — metFORMIN HCL 500 MG TABS: 500 | 90 days supply | Qty: 180 | Fill #1

## 2018-12-15 NOTE — Telephone Encounter (Signed)
Needs in person or virtual visit coming up by September May have refill

## 2018-12-16 ENCOUNTER — Ambulatory Visit: Payer: 59 | Admitting: Family Medicine

## 2018-12-17 MED FILL — ROSUVASTATIN CALCIUM 40 MG: 40 | 90 days supply | Qty: 90 | Fill #0

## 2018-12-26 ENCOUNTER — Ambulatory Visit (INDEPENDENT_AMBULATORY_CARE_PROVIDER_SITE_OTHER): Payer: 59 | Admitting: Family Medicine

## 2018-12-26 ENCOUNTER — Encounter: Payer: Self-pay | Admitting: Family Medicine

## 2018-12-26 ENCOUNTER — Other Ambulatory Visit: Payer: Self-pay

## 2018-12-26 VITALS — BP 132/82 | Ht 64.0 in | Wt 202.4 lb

## 2018-12-26 DIAGNOSIS — E119 Type 2 diabetes mellitus without complications: Secondary | ICD-10-CM

## 2018-12-26 DIAGNOSIS — E1169 Type 2 diabetes mellitus with other specified complication: Secondary | ICD-10-CM

## 2018-12-26 DIAGNOSIS — Z0000 Encounter for general adult medical examination without abnormal findings: Secondary | ICD-10-CM

## 2018-12-26 DIAGNOSIS — E785 Hyperlipidemia, unspecified: Secondary | ICD-10-CM | POA: Diagnosis not present

## 2018-12-26 DIAGNOSIS — I10 Essential (primary) hypertension: Secondary | ICD-10-CM

## 2018-12-26 DIAGNOSIS — R899 Unspecified abnormal finding in specimens from other organs, systems and tissues: Secondary | ICD-10-CM

## 2018-12-26 NOTE — Progress Notes (Signed)
Subjective:    Patient ID: Amy Dean, female    DOB: [birth year], 53 y.o.   MRN: [id number]  HPI  The patient comes in today for a wellness visit. Patient sees GYN for female health and needs physical for insurance purposes per patient.   A review of their health history was completed.  A review of medications was also completed.  Any needed refills; yes  Eating habits: not good  Falls/  MVA accidents in past few months: none  Regular exercise: not good  Specialist pt sees on regular basis: Dr Elonda Husky GYN  Preventative health issues were discussed.   Additional concerns: Has multiple other health issues that does need standard follow-up  Blood pressure-watches her diet tries to stay active takes her medicine regular basis chronic history of blood pressure denies complications no chest pain headaches states her readings have been good will need refill on her medicines  Hyperlipidemia watches her diet takes her medicine chronic standing states medication getting along with her does need refills of her medicine previous labs reviewed new labs ordered  Diabetes watches her diet trying to lose weight staying active checks her sugars periodically they seem to be under good control she does take her medicine she denies any setbacks does need to do her lab work in the near future previous labs reviewed  Patient with history of anxiousness related symptoms.  Takes her medicines on a regular basis.  She wants to continue her medicine she does not want to stop these at this point denies being depressed currently  The patient's BMI is calculated.  The patient does have obesity.  The patient does try to some degree staying active and watching diet.  It is in the vital signs and acknowledged.  It is above the recommended BMI for the patient's height and weight.  The patient has been counseled regarding healthy diet, restricted portions, avoiding excessive carbohydrates/sugary foods, and  increase physical activity as health permits.  It is in the patient's best interest to lower the risk of secondary illness including heart disease strokes and cancer by losing weight.  The patient acknowledges this information.   Review of Systems  Constitutional: Negative for activity change, appetite change and fatigue.  HENT: Negative for congestion and rhinorrhea.   Respiratory: Negative for cough and shortness of breath.   Cardiovascular: Negative for chest pain and leg swelling.  Gastrointestinal: Negative for abdominal pain and diarrhea.  Endocrine: Negative for polydipsia and polyphagia.  Skin: Negative for color change.  Neurological: Negative for dizziness and weakness.  Psychiatric/Behavioral: Negative for behavioral problems and confusion.       Objective:   Physical Exam Constitutional:      Appearance: She is well-developed.  HENT:     Head: Normocephalic.     Right Ear: External ear normal.     Left Ear: External ear normal.  Eyes:     Pupils: Pupils are equal, round, and reactive to light.  Neck:     Musculoskeletal: Normal range of motion.     Thyroid: No thyromegaly.  Cardiovascular:     Rate and Rhythm: Normal rate and regular rhythm.     Heart sounds: Normal heart sounds. No murmur.  Pulmonary:     Effort: Pulmonary effort is normal. No respiratory distress.     Breath sounds: Normal breath sounds. No wheezing.  Abdominal:     General: Bowel sounds are normal. There is no distension.     Palpations: Abdomen is soft. There  is no mass.     Tenderness: There is no abdominal tenderness.  Musculoskeletal: Normal range of motion.        General: No tenderness.  Lymphadenopathy:     Cervical: No cervical adenopathy.  Skin:    General: Skin is warm and dry.  Neurological:     Mental Status: She is alert and oriented to person, place, and time.     Motor: No abnormal muscle tone.  Psychiatric:        Behavior: Behavior normal.           Assessment &  Plan:  Adult wellness-complete.wellness physical was conducted today. Importance of diet and exercise were discussed in detail.  In addition to this a discussion regarding safety was also covered. We also reviewed over immunizations and gave recommendations regarding current immunization needed for age.  In addition to this additional areas were also touched on including: Preventative health exams needed:  Colonoscopy next one will be due 2026 Patient was advised yearly wellness exam   HTN continue medications refills given previous labs reviewed blood pressure under good control  Hyperlipidemia subpar control watch diet take medication lab work previous reviewed new labs ordered  Diabetes fair control sugars in decent range continue medications no complications but important to repeat her A1c  Separate and identifiable chronic health issues were addressed at this wellness visit and were justifiable in charge 15 minutes was spent with patient today discussing healthcare issues which they came.  More than 50% of this visit-total duration of visit-was spent in counseling and coordination of care.  Please see diagnosis regarding the focus of this coordination and care

## 2018-12-26 NOTE — Patient Instructions (Signed)

## 2018-12-27 MED FILL — ALPRAZolam 0.5 MG TABS: 0.5 | 30 days supply | Qty: 30 | Fill #1

## 2019-01-04 MED ORDER — CITALOPRAM HYDROBROMIDE 20 MG PO TABS: 30.0000 mg | ORAL_TABLET | Freq: Every day | ORAL | 1 refills | Status: DC

## 2019-01-04 MED ORDER — ALPRAZOLAM 0.5 MG PO TABS: 0.5000 mg | ORAL_TABLET | Freq: Every evening | ORAL | 5 refills | Status: DC | PRN

## 2019-01-04 MED ORDER — LISINOPRIL-HYDROCHLOROTHIAZIDE 20-12.5 MG PO TABS: ORAL_TABLET | ORAL | 1 refills | Status: DC

## 2019-01-04 MED ORDER — METFORMIN HCL 500 MG PO TABS: ORAL_TABLET | ORAL | 1 refills | Status: DC

## 2019-01-04 MED ORDER — AMLODIPINE BESYLATE 10 MG PO TABS: 10.0000 mg | ORAL_TABLET | Freq: Every day | ORAL | 1 refills | Status: DC

## 2019-01-04 MED ORDER — ROSUVASTATIN CALCIUM 40 MG PO TABS: 40.0000 mg | ORAL_TABLET | Freq: Every day | ORAL | 1 refills | Status: DC

## 2019-01-27 ENCOUNTER — Other Ambulatory Visit: Payer: Self-pay | Admitting: Family Medicine

## 2019-01-27 MED FILL — VICTOZA 2-PAK 18 MG/3 ML PE: 18 | 30 days supply | Qty: 6 | Fill #0

## 2019-01-27 MED FILL — ALPRAZolam 0.5 MG TABS: 0.5 | 30 days supply | Qty: 30 | Fill #2

## 2019-03-03 MED FILL — ALPRAZolam 0.5 MG TABS: 0.5 | 30 days supply | Qty: 30 | Fill #3

## 2019-03-06 DIAGNOSIS — H5201 Hypermetropia, right eye: Secondary | ICD-10-CM | POA: Diagnosis not present

## 2019-03-06 DIAGNOSIS — H5212 Myopia, left eye: Secondary | ICD-10-CM | POA: Diagnosis not present

## 2019-03-06 DIAGNOSIS — H52223 Regular astigmatism, bilateral: Secondary | ICD-10-CM | POA: Diagnosis not present

## 2019-03-06 DIAGNOSIS — H524 Presbyopia: Secondary | ICD-10-CM | POA: Diagnosis not present

## 2019-03-06 LAB — HM DIABETES EYE EXAM

## 2019-03-25 ENCOUNTER — Other Ambulatory Visit (HOSPITAL_COMMUNITY): Payer: Self-pay | Admitting: Family Medicine

## 2019-03-25 DIAGNOSIS — Z1231 Encounter for screening mammogram for malignant neoplasm of breast: Secondary | ICD-10-CM

## 2019-03-31 ENCOUNTER — Other Ambulatory Visit: Payer: Self-pay | Admitting: Family Medicine

## 2019-03-31 ENCOUNTER — Other Ambulatory Visit: Payer: Self-pay

## 2019-03-31 ENCOUNTER — Ambulatory Visit (HOSPITAL_COMMUNITY)
Admission: RE | Admit: 2019-03-31 | Discharge: 2019-03-31 | Disposition: A | Discharge status: Home / Self Care | Payer: 59 | Source: Ambulatory Visit | Attending: Family Medicine | Admitting: Family Medicine

## 2019-03-31 DIAGNOSIS — Z1231 Encounter for screening mammogram for malignant neoplasm of breast: Secondary | ICD-10-CM | POA: Insufficient documentation

## 2019-03-31 MED FILL — VICTOZA 2-PAK 18 MG/3 ML PE: 18 | 30 days supply | Qty: 6 | Fill #0

## 2019-03-31 MED FILL — LISINOPRIL-HCTZ 20-12.5 MG: 20-12.5 | 90 days supply | Qty: 90 | Fill #0

## 2019-04-02 ENCOUNTER — Other Ambulatory Visit (HOSPITAL_COMMUNITY)
Admission: RE | Admit: 2019-04-02 | Discharge: 2019-04-02 | Disposition: A | Discharge status: Home / Self Care | Payer: 59 | Source: Ambulatory Visit | Attending: Family Medicine | Admitting: Family Medicine

## 2019-04-02 DIAGNOSIS — R899 Unspecified abnormal finding in specimens from other organs, systems and tissues: Secondary | ICD-10-CM | POA: Diagnosis not present

## 2019-04-02 DIAGNOSIS — E119 Type 2 diabetes mellitus without complications: Secondary | ICD-10-CM | POA: Diagnosis not present

## 2019-04-02 LAB — HEPATIC FUNCTION PANEL
ALT: 45 U/L — ABNORMAL HIGH (ref 0–44)
AST: 50 U/L — ABNORMAL HIGH (ref 15–41)
Albumin: 4.1 g/dL (ref 3.5–5.0)
Alkaline Phosphatase: 92 U/L (ref 38–126)
Bilirubin, Direct: 0.1 mg/dL (ref 0.0–0.2)
Indirect Bilirubin: 0.5 mg/dL (ref 0.3–0.9)
Total Bilirubin: 0.6 mg/dL (ref 0.3–1.2)
Total Protein: 7.4 g/dL (ref 6.5–8.1)

## 2019-04-02 LAB — HEMOGLOBIN A1C
Hgb A1c MFr Bld: 6.8 % — ABNORMAL HIGH (ref 4.8–5.6)
Mean Plasma Glucose: 148.46 mg/dL

## 2019-04-02 MED FILL — ALPRAZolam 0.5 MG TABS: 0.5 | 30 days supply | Qty: 30 | Fill #0

## 2019-04-08 ENCOUNTER — Other Ambulatory Visit: Payer: Self-pay | Admitting: *Deleted

## 2019-04-08 DIAGNOSIS — R748 Abnormal levels of other serum enzymes: Secondary | ICD-10-CM

## 2019-04-15 ENCOUNTER — Encounter: Payer: Self-pay | Admitting: Family Medicine

## 2019-04-15 NOTE — Telephone Encounter (Signed)
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

## 2019-04-16 ENCOUNTER — Ambulatory Visit (HOSPITAL_COMMUNITY)
Admission: RE | Admit: 2019-04-16 | Discharge: 2019-04-16 | Disposition: A | Discharge status: Home / Self Care | Payer: 59 | Source: Ambulatory Visit | Attending: Family Medicine | Admitting: Family Medicine

## 2019-04-16 ENCOUNTER — Other Ambulatory Visit: Payer: Self-pay

## 2019-04-16 DIAGNOSIS — K7689 Other specified diseases of liver: Secondary | ICD-10-CM | POA: Diagnosis not present

## 2019-04-16 DIAGNOSIS — R748 Abnormal levels of other serum enzymes: Secondary | ICD-10-CM | POA: Insufficient documentation

## 2019-04-16 DIAGNOSIS — K760 Fatty (change of) liver, not elsewhere classified: Secondary | ICD-10-CM | POA: Diagnosis not present

## 2019-04-18 ENCOUNTER — Telehealth: Payer: Self-pay | Admitting: Family Medicine

## 2019-04-18 DIAGNOSIS — R935 Abnormal findings on diagnostic imaging of other abdominal regions, including retroperitoneum: Secondary | ICD-10-CM

## 2019-04-18 NOTE — Telephone Encounter (Signed)
Pt would like to move forward with CT. She would like scan done at [facility].

## 2019-04-18 NOTE — Telephone Encounter (Signed)
Please advise. Thank you

## 2019-04-19 NOTE — Telephone Encounter (Signed)
Nurses Please see result note I believe this is why the patient is called because she gets this result via MyChart Please handle, let the patient know that this scan is being ordered Also I will want to do a follow-up discussion/virtual visit with the patient after we get results. Please move forward with scheduling

## 2019-04-21 ENCOUNTER — Other Ambulatory Visit (HOSPITAL_COMMUNITY)
Admission: RE | Admit: 2019-04-21 | Discharge: 2019-04-21 | Disposition: A | Discharge status: Home / Self Care | Payer: 59 | Source: Ambulatory Visit | Attending: Family Medicine | Admitting: Family Medicine

## 2019-04-21 ENCOUNTER — Other Ambulatory Visit: Payer: Self-pay

## 2019-04-21 ENCOUNTER — Ambulatory Visit (HOSPITAL_COMMUNITY)
Admission: RE | Admit: 2019-04-21 | Discharge: 2019-04-21 | Disposition: A | Discharge status: Home / Self Care | Payer: 59 | Source: Ambulatory Visit | Attending: Family Medicine | Admitting: Family Medicine

## 2019-04-21 DIAGNOSIS — R935 Abnormal findings on diagnostic imaging of other abdominal regions, including retroperitoneum: Secondary | ICD-10-CM | POA: Insufficient documentation

## 2019-04-21 DIAGNOSIS — K760 Fatty (change of) liver, not elsewhere classified: Secondary | ICD-10-CM | POA: Diagnosis not present

## 2019-04-21 LAB — CREATININE, SERUM
Creatinine, Ser: 0.65 mg/dL (ref 0.44–1.00)
GFR calc Af Amer: >60 mL/min (ref >60)
GFR calc non Af Amer: >60 mL/min (ref >60)

## 2019-04-21 LAB — BUN: BUN: 11 mg/dL (ref 6–20)

## 2019-04-21 MED ORDER — IOHEXOL 300 MG/ML  SOLN
100.0000 mL | Freq: Once | INTRAMUSCULAR | Status: AC | PRN
  Administered 2019-04-21: 100 mL via INTRAVENOUS

## 2019-04-21 MED ORDER — IOHEXOL 9 MG/ML PO SOLN: 500.0000 mL | ORAL | Status: DC

## 2019-04-21 NOTE — Telephone Encounter (Signed)
STAT CT ordered and scheduled for today at [facility]. Pt will also need BUN and Creatine stat; went ahead and placed those orders. Contacted pt and informed her to go ahead to [facility]. Pt is to remain NPO also. Pt verbalized understanding.

## 2019-04-22 ENCOUNTER — Telehealth: Payer: Self-pay | Admitting: Family Medicine

## 2019-04-22 NOTE — Telephone Encounter (Signed)
Front-there is no need for the patient to come in for Wednesday's appointment.  She is aware of that.

## 2019-04-23 ENCOUNTER — Ambulatory Visit: Payer: 59 | Admitting: Family Medicine

## 2019-04-23 ENCOUNTER — Encounter: Payer: Self-pay | Admitting: Family Medicine

## 2019-05-06 ENCOUNTER — Encounter: Payer: Self-pay | Admitting: Family Medicine

## 2019-05-06 ENCOUNTER — Other Ambulatory Visit: Payer: Self-pay

## 2019-05-06 ENCOUNTER — Ambulatory Visit (INDEPENDENT_AMBULATORY_CARE_PROVIDER_SITE_OTHER): Payer: 59 | Admitting: Family Medicine

## 2019-05-06 DIAGNOSIS — H10503 Unspecified blepharoconjunctivitis, bilateral: Secondary | ICD-10-CM | POA: Diagnosis not present

## 2019-05-06 MED ORDER — TOBRAMYCIN-DEXAMETHASONE 0.3-0.1 % OP SUSP: 1.0000 [drp] | OPHTHALMIC | 0 refills | Status: DC

## 2019-05-06 MED FILL — TOBRAMYCIN-DEXAMETH OPTH SU: 0.3-0.1 | 10 days supply | Qty: 5 | Fill #0

## 2019-05-06 NOTE — Progress Notes (Signed)
   Subjective:  Audio only  Patient ID: Amy Dean, female    DOB: [birth year], 53 y.o.   MRN: [id number]  Eye Pain  Both eyes are affected.This is a new problem. Episode onset: one month. She does not wear contacts (wears glasses). Associated symptoms include eye redness. Associated symptoms comments: Puffy, sore, hurt with light, watery, some itch. She has tried eye drops for the symptoms. The treatment provided mild relief.   Virtual Visit via Telephone Note  I connected with Lemmie Garnet Ruda on 05/06/19 at 10:30 AM EST by telephone and verified that I am speaking with the correct person using two identifiers.  Location: Patient: home Provider: office   I discussed the limitations, risks, security and privacy concerns of performing an evaluation and management service by telephone and the availability of in person appointments. I also discussed with the patient that there may be a patient responsible charge related to this service. The patient expressed understanding and agreed to proceed.   History of Present Illness:    Observations/Objective:   Assessment and Plan:   Follow Up Instructions:    I discussed the assessment and treatment plan with the patient. The patient was provided an opportunity to ask questions and all were answered. The patient agreed with the plan and demonstrated an understanding of the instructions.   The patient was advised to call back or seek an in-person evaluation if the symptoms worsen or if the condition fails to improve as anticipated.  I provided 18 minutes of non-face-to-face time during this encounter.   Vicente Males, LPN  Patient has progressive irritation of both eyes.  Several weeks duration.  Itchiness.  Irritation.  Some light sensitivity.  White part of the eye injected.  No other symptoms of headache sore throat cough fever  Review of Systems  Eyes: Positive for pain and redness.       Objective:   Physical  Exam  Virtual      Assessment & Plan:  Impression progressive bilateral conjunctivitis.  With some potential infectious elements.  And some potential allergenic elements.  TobraDex proper use discussed.  Local measures discussed.  Added twice daily baby shampoo cleansing of eyelids to cover any element of blepharitis if symptoms persist encouraged to see on

## 2019-05-08 ENCOUNTER — Other Ambulatory Visit (HOSPITAL_COMMUNITY): Payer: 59

## 2019-05-19 ENCOUNTER — Other Ambulatory Visit: Payer: Self-pay | Admitting: Family Medicine

## 2019-05-19 MED FILL — metFORMIN HCL 500 MG TABS: 500 | 90 days supply | Qty: 180 | Fill #0

## 2019-05-19 MED FILL — ALPRAZolam 0.5 MG TABS: 0.5 | 30 days supply | Qty: 30 | Fill #1

## 2019-05-19 MED FILL — VICTOZA 2-PAK 18 MG/3 ML PE: 18 | 30 days supply | Qty: 6 | Fill #0

## 2019-05-19 NOTE — Telephone Encounter (Signed)
May have this +1 refill needs follow-up for diabetes by early March

## 2019-05-27 MED FILL — PANTOPRAZOLE SOD DR 40 MG T: 40 | 90 days supply | Qty: 90 | Fill #0

## 2019-05-27 MED FILL — FLUOROMETHOLONE 0.1% DROPS: 0.1 | 14 days supply | Qty: 5 | Fill #0

## 2019-06-17 MED FILL — ALPRAZolam 0.5 MG TABS: 0.5 | 30 days supply | Qty: 30 | Fill #2

## 2019-07-08 ENCOUNTER — Other Ambulatory Visit: Payer: Self-pay | Admitting: Family Medicine

## 2019-07-08 MED FILL — LISINOPRIL-HCTZ 20-12.5 MG: 20-12.5 | 90 days supply | Qty: 90 | Fill #1

## 2019-07-08 MED FILL — AMLODIPINE BESYLATE 10 MG T: 10 | 90 days supply | Qty: 90 | Fill #0

## 2019-07-08 MED FILL — ROSUVASTATIN CALCIUM 40 MG: 40 | 90 days supply | Qty: 90 | Fill #0

## 2019-07-08 MED FILL — CITALOPRAM HBR 20 MG TABLET: 20 | 90 days supply | Qty: 135 | Fill #0

## 2019-07-08 NOTE — Telephone Encounter (Signed)
lvm to schedule diabetes check up

## 2019-07-08 NOTE — Telephone Encounter (Signed)
Please contact patient to set up appt for Diabetes Management; then may route back to nurses. Thank you

## 2019-07-09 NOTE — Telephone Encounter (Signed)
Scheduled 3/17

## 2019-07-10 MED FILL — FREESTYLE LITE TEST STRIP: 50 days supply | Qty: 50 | Fill #0

## 2019-07-10 MED FILL — FREESTYLE LANCETS: 90 days supply | Qty: 100 | Fill #0

## 2019-07-10 MED FILL — VICTOZA 2-PAK 18 MG/3 ML PE: 18 | 30 days supply | Qty: 6 | Fill #0

## 2019-07-10 MED FILL — UNIFINE PENTIPS 32GX5/32: 32G X 4 MM | 90 days supply | Qty: 100 | Fill #0

## 2019-07-10 MED FILL — UNIFINE PENTIPS 32GX5/32": 32G X 4 MM | 90 days supply | Qty: 100 | Fill #0

## 2019-07-17 ENCOUNTER — Other Ambulatory Visit: Payer: Self-pay | Admitting: Family Medicine

## 2019-07-17 MED FILL — ALPRAZolam 0.5 MG TABS: 0.5 | 30 days supply | Qty: 30 | Fill #0

## 2019-07-17 NOTE — Telephone Encounter (Signed)
Wellness 12/26/18

## 2019-07-23 ENCOUNTER — Other Ambulatory Visit: Payer: Self-pay

## 2019-07-23 ENCOUNTER — Ambulatory Visit (INDEPENDENT_AMBULATORY_CARE_PROVIDER_SITE_OTHER): Payer: 59 | Admitting: Family Medicine

## 2019-07-23 ENCOUNTER — Other Ambulatory Visit: Payer: Self-pay | Admitting: Family Medicine

## 2019-07-23 DIAGNOSIS — E119 Type 2 diabetes mellitus without complications: Secondary | ICD-10-CM | POA: Diagnosis not present

## 2019-07-23 DIAGNOSIS — R253 Fasciculation: Secondary | ICD-10-CM | POA: Diagnosis not present

## 2019-07-23 DIAGNOSIS — I10 Essential (primary) hypertension: Secondary | ICD-10-CM | POA: Diagnosis not present

## 2019-07-23 DIAGNOSIS — R748 Abnormal levels of other serum enzymes: Secondary | ICD-10-CM | POA: Diagnosis not present

## 2019-07-23 DIAGNOSIS — E781 Pure hyperglyceridemia: Secondary | ICD-10-CM

## 2019-07-23 MED ORDER — ALPRAZOLAM 0.5 MG PO TABS: 0.5000 mg | ORAL_TABLET | Freq: Every evening | ORAL | 5 refills | Status: DC | PRN

## 2019-07-23 MED ORDER — VICTOZA 18 MG/3ML ~~LOC~~ SOPN: PEN_INJECTOR | SUBCUTANEOUS | 5 refills | Status: DC

## 2019-07-23 MED ORDER — METFORMIN HCL 500 MG PO TABS: ORAL_TABLET | ORAL | 1 refills | Status: DC

## 2019-07-23 MED ORDER — CITALOPRAM HYDROBROMIDE 20 MG PO TABS: ORAL_TABLET | ORAL | 0 refills | Status: DC

## 2019-07-23 MED ORDER — PANTOPRAZOLE SODIUM 40 MG PO TBEC: DELAYED_RELEASE_TABLET | ORAL | 1 refills | Status: DC

## 2019-07-23 MED ORDER — AMLODIPINE BESYLATE 10 MG PO TABS: 10.0000 mg | ORAL_TABLET | Freq: Every day | ORAL | 1 refills | Status: DC

## 2019-07-23 MED ORDER — OMEGA-3-ACID ETHYL ESTERS 1 G PO CAPS: ORAL_CAPSULE | ORAL | 1 refills | Status: DC

## 2019-07-23 MED ORDER — LISINOPRIL-HYDROCHLOROTHIAZIDE 20-12.5 MG PO TABS: ORAL_TABLET | ORAL | 1 refills | Status: DC

## 2019-07-23 MED ORDER — ROSUVASTATIN CALCIUM 40 MG PO TABS: 40.0000 mg | ORAL_TABLET | Freq: Every day | ORAL | 1 refills | Status: DC

## 2019-07-23 MED FILL — OMEGA-3 ETHYL ESTERS 1 GM C: 1 | 90 days supply | Qty: 180 | Fill #0

## 2019-07-23 NOTE — Progress Notes (Signed)
Subjective:    Patient ID: Amy Dean, female    DOB: 06-11-1965, 54 y.o.   MRN: [id number]  Diabetes She presents for her follow-up diabetic visit. She has type 2 diabetes mellitus. There are no hypoglycemic associated symptoms. Pertinent negatives for hypoglycemia include no confusion or dizziness. Pertinent negatives for diabetes include no chest pain, no fatigue, no polydipsia, no polyphagia and no weakness. (Right eye jumps a lot) There are no hypoglycemic complications. There are no diabetic complications. She participates in exercise intermittently. (Pt checks sugars about once a week; ranging in the 80s) She does not see a podiatrist.Eye exam is current.  Eye muscle twitches - Plan: Basic Metabolic Panel (BMET), Lipid Profile, Urine Microalbumin w/creat. ratio, Hemoglobin A1c, Antinuclear Antib (ANA), Iron Binding Cap (TIBC)([facility]), Ferritin, Hepatitis B Surface AntiGEN, Hepatitis C Antibody, TSH, T4, free  Essential hypertension - Plan: Basic Metabolic Panel (BMET), Lipid Profile, Urine Microalbumin w/creat. ratio, Hemoglobin A1c, Antinuclear Antib (ANA), Iron Binding Cap (TIBC)([facility]), Ferritin, Hepatitis B Surface AntiGEN, Hepatitis C Antibody, TSH, T4, free  Controlled type 2 diabetes mellitus without complication, without long-term current use of insulin (HCC) - Plan: Basic Metabolic Panel (BMET), Lipid Profile, Urine Microalbumin w/creat. ratio, Hemoglobin A1c, Antinuclear Antib (ANA), Iron Binding Cap (TIBC)([facility]), Ferritin, Hepatitis B Surface AntiGEN, Hepatitis C Antibody, TSH, T4, free  Hypertriglyceridemia - Plan: Basic Metabolic Panel (BMET), Lipid Profile, Urine Microalbumin w/creat. ratio, Hemoglobin A1c, Antinuclear Antib (ANA), Iron Binding Cap (TIBC)([facility]), Ferritin, Hepatitis B Surface AntiGEN, Hepatitis C Antibody, TSH, T4, free  Elevated liver enzymes - Plan: Basic Metabolic Panel (BMET), Lipid Profile, Urine  Microalbumin w/creat. ratio, Hemoglobin A1c, Antinuclear Antib (ANA), Iron Binding Cap (TIBC)([facility]), Ferritin, Hepatitis B Surface AntiGEN, Hepatitis C Antibody, TSH, T4, free   Patient states sugars been under decent control watching her diet try to stay active taking her medications no low sugar spells Patient is trying to keep her weight check by watching diet portion control unable to do much exercise because of her work Blood pressure in a reportedly good condition taking medication denies any setbacks Patient is due for lab work to look at cholesterol as well  Medications reviewed with patient Current Outpatient Medications on File Prior to Visit  Medication Sig Dispense Refill  . acetaminophen (TYLENOL) 500 MG tablet Take 500 mg by mouth every 8 (eight) hours as needed for mild pain. Reported on 07/12/2015    . aspirin 81 MG tablet Take 81 mg by mouth daily.    . cetirizine (ZYRTEC) 10 MG tablet Take 1 tablet (10 mg total) by mouth daily as needed for allergies.    . cholecalciferol (VITAMIN D) 1000 units tablet Take 1,000 Units by mouth daily.    . fexofenadine (ALLEGRA) 180 MG tablet Take 1 tablet (180 mg total) by mouth daily. 90 tablet 1  . fluticasone (FLONASE) 50 MCG/ACT nasal spray Place 2 sprays into both nostrils daily as needed for allergies. 16 g 5  . Insulin Pen Needle (PEN NEEDLES 31GX5/16") 31G X 8 MM MISC Use daily as directed 100 each 11  . ketoconazole (NIZORAL) 2 % cream Apply 1 application topically 3 (three) times a week.    . tobramycin-dexamethasone (TOBRADEX) ophthalmic solution Place 1 drop into both eyes every 4 (four) hours while awake. 5 mL 0  . triamcinolone cream (KENALOG) 0.1 % Apply 1 application topically 3 (three) times a week.     No current facility-administered medications on file prior to visit.    Virtual Visit via Telephone Note  I connected with Amy Dean on 07/23/19 at  8:30 AM EDT by telephone and verified that I am  speaking with the correct person using two identifiers.  Location: Patient: home Provider: office   I discussed the limitations, risks, security and privacy concerns of performing an evaluation and management service by telephone and the availability of in person appointments. I also discussed with the patient that there may be a patient responsible charge related to this service. The patient expressed understanding and agreed to proceed.   History of Present Illness:    Observations/Objective:   Assessment and Plan:   Follow Up Instructions:    I discussed the assessment and treatment plan with the patient. The patient was provided an opportunity to ask questions and all were answered. The patient agreed with the plan and demonstrated an understanding of the instructions.   The patient was advised to call back or seek an in-person evaluation if the symptoms worsen or if the condition fails to improve as anticipated.  I provided 30 minutes of non-face-to-face time during this encounter.  Between chart review ordering of tests ordering of medications discussing with patient and documentation       Review of Systems  Constitutional: Negative for activity change, appetite change and fatigue.  HENT: Negative for congestion and rhinorrhea.   Respiratory: Negative for cough and shortness of breath.   Cardiovascular: Negative for chest pain and leg swelling.  Gastrointestinal: Negative for abdominal pain and diarrhea.  Endocrine: Negative for polydipsia and polyphagia.  Skin: Negative for color change.  Neurological: Negative for dizziness and weakness.  Psychiatric/Behavioral: Negative for behavioral problems and confusion.       Objective:   Physical Exam  Today's visit was via telephone Physical exam was not possible for this visit       Assessment & Plan:  1. Eye muscle twitches The patient will monitor this.  If it does not improve over the next couple weeks I  recommend that we get her in with neurology for further evaluation - Basic Metabolic Panel (BMET) - Lipid Profile - Urine Microalbumin w/creat. ratio - Hemoglobin A1c - Antinuclear Antib (ANA) - Iron Binding Cap (TIBC)([facility]) - Ferritin - Hepatitis B Surface AntiGEN - Hepatitis C Antibody - TSH - T4, free  2. Essential hypertension Blood pressure decent control continue current medicines check labs - Basic Metabolic Panel (BMET) - Lipid Profile - Urine Microalbumin w/creat. ratio - Hemoglobin A1c - Antinuclear Antib (ANA) - Iron Binding Cap (TIBC)([facility]) - Ferritin - Hepatitis B Surface AntiGEN - Hepatitis C Antibody - TSH - T4, free  3. Controlled type 2 diabetes mellitus without complication, without long-term current use of insulin (HCC) Diabetes decent control check A1c await the results encourage patient to continue with healthy diet portion control watching her weight - Basic Metabolic Panel (BMET) - Lipid Profile - Urine Microalbumin w/creat. ratio - Hemoglobin A1c - Antinuclear Antib (ANA) - Iron Binding Cap (TIBC)([facility]) - Ferritin - Hepatitis B Surface AntiGEN - Hepatitis C Antibody - TSH - T4, free  4. Hypertriglyceridemia Continue [place] healthy diet check labs - Basic Metabolic Panel (BMET) - Lipid Profile - Urine Microalbumin w/creat. ratio - Hemoglobin A1c - Antinuclear Antib (ANA) - Iron Binding Cap (TIBC)([facility]) - Ferritin - Hepatitis B Surface AntiGEN - Hepatitis C Antibody - TSH - T4, free  5. Elevated liver enzymes Patient never did follow through with doing lab work for this lab work ordered.  Importance of watching diet portion control try to  keep her weight down best way to prevent this from going into cirrhosis - Basic Metabolic Panel (BMET) - Lipid Profile - Urine Microalbumin w/creat. ratio - Hemoglobin A1c - Antinuclear Antib (ANA) - Iron Binding Cap (TIBC)([facility]) -  Ferritin - Hepatitis B Surface AntiGEN - Hepatitis C Antibody - TSH - T4, free  Next follow-up should be in person

## 2019-07-28 ENCOUNTER — Encounter: Payer: Self-pay | Admitting: Family Medicine

## 2019-08-13 ENCOUNTER — Other Ambulatory Visit: Payer: Self-pay

## 2019-08-13 ENCOUNTER — Other Ambulatory Visit (HOSPITAL_COMMUNITY)
Admission: RE | Admit: 2019-08-13 | Discharge: 2019-08-13 | Disposition: A | Discharge status: Home / Self Care | Payer: 59 | Source: Ambulatory Visit | Attending: Family Medicine | Admitting: Family Medicine

## 2019-08-13 DIAGNOSIS — R253 Fasciculation: Secondary | ICD-10-CM | POA: Insufficient documentation

## 2019-08-13 DIAGNOSIS — R748 Abnormal levels of other serum enzymes: Secondary | ICD-10-CM | POA: Diagnosis not present

## 2019-08-13 DIAGNOSIS — E781 Pure hyperglyceridemia: Secondary | ICD-10-CM | POA: Diagnosis not present

## 2019-08-13 DIAGNOSIS — I10 Essential (primary) hypertension: Secondary | ICD-10-CM | POA: Insufficient documentation

## 2019-08-13 LAB — T4, FREE: Free T4: 0.8 ng/dL (ref 0.61–1.12)

## 2019-08-13 LAB — HEPATITIS C ANTIBODY: HCV Ab: NONREACTIVE

## 2019-08-13 LAB — TSH: TSH: 1.586 u[IU]/mL (ref 0.350–4.500)

## 2019-08-13 LAB — HEPATITIS B SURFACE ANTIGEN: Hepatitis B Surface Ag: NONREACTIVE

## 2019-08-13 LAB — FERRITIN: Ferritin: 106 ng/mL (ref 11–307)

## 2019-08-28 MED FILL — VICTOZA 2-PAK 18 MG/3 ML PE: 18 | 30 days supply | Qty: 6 | Fill #0

## 2019-09-10 MED FILL — ALPRAZolam 0.5 MG TABS: 0.5 | 30 days supply | Qty: 30 | Fill #1

## 2019-10-05 IMAGING — MR MR LUMBAR SPINE WO/W CM
11 of 28 series · 18 of 48 positions shown · IV contrast (multihance)
Comparison: Brain MRI 05/11/2017.

CLINICAL DATA: Headaches and diffuse back pain. Possible infectious
encephalitis on brain MRI.

EXAM:
MRI CERVICAL, THORACIC AND LUMBAR SPINE WITHOUT AND WITH CONTRAST
TECHNIQUE: Multiplanar and multiecho pulse sequences of the cervical spine, to
include the craniocervical junction and cervicothoracic junction,
and thoracic and lumbar spine, were obtained without and with
intravenous contrast.
CONTRAST:  18 mL MultiHance

[Series 2: T2 · sagittal · 3.0mm · 0.43mm/px · 2 of 17 slices shown (1 of 7)]
[im 1/17]
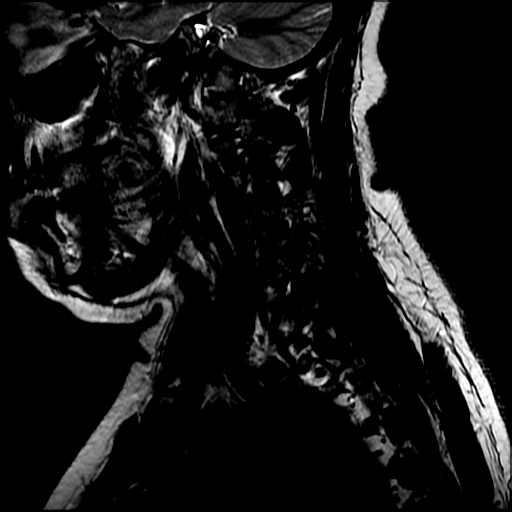
[im 17/17]
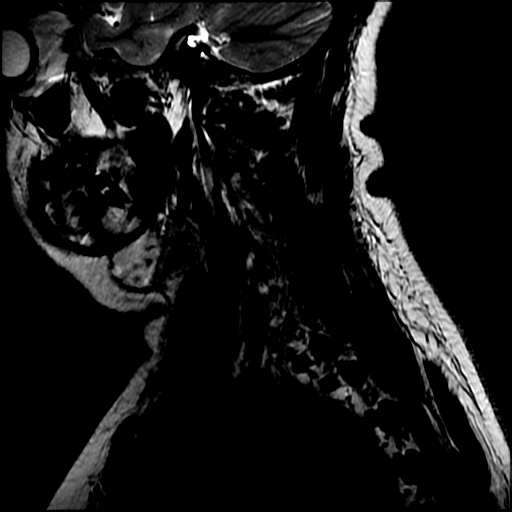

[Series 6: T2 · axial · 3.0mm · 0.35mm/px · z∈[-78,+28]mm · 2 of 33 slices shown (2 of 7)]
[im 1/33]
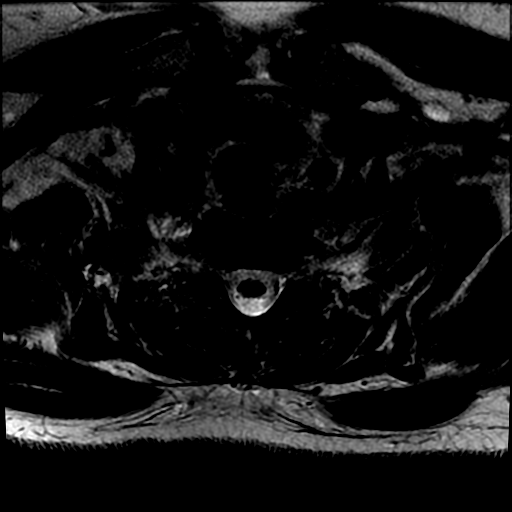
[im 33/33]
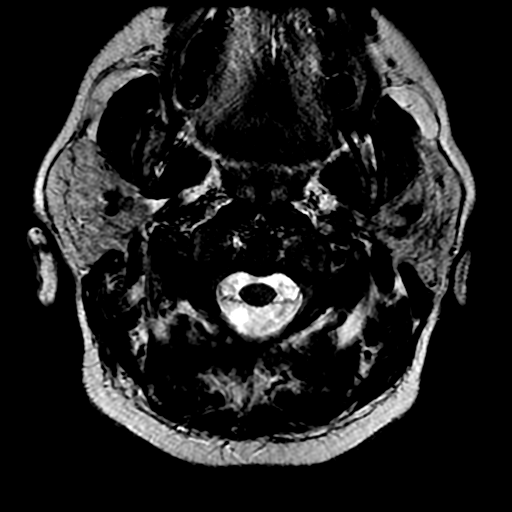

[Series 7: T1 · axial · non-contrast · 3.0mm · 0.35mm/px · z∈[-78,+28]mm · 2 of 33 slices shown (1 of 4)]
[im 1/33]
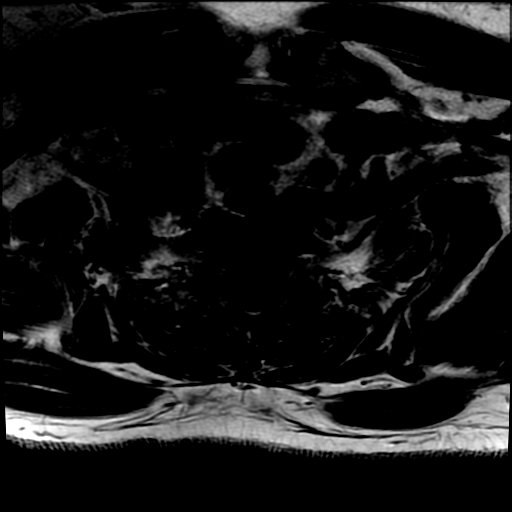
[im 33/33]
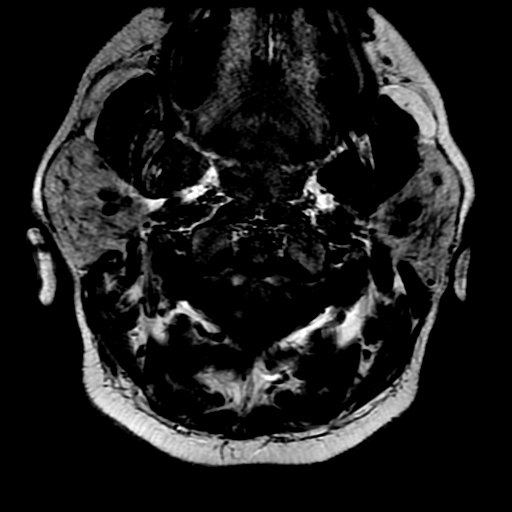

[Series 9: T1 · sagittal · 3.0mm · 0.90mm/px · 1 of 15 slices shown (2 of 4)]
[im 1/15]
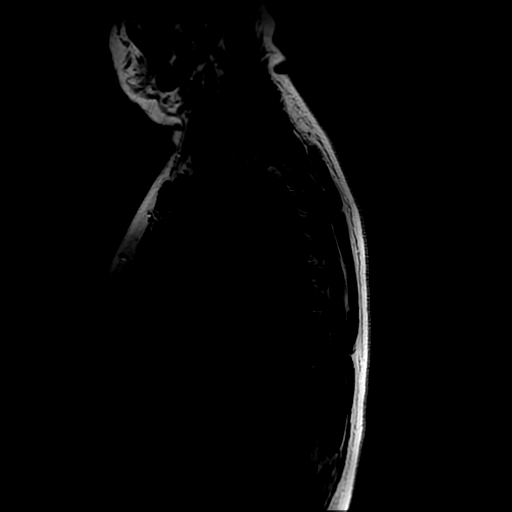

[Series 10: T2 · sagittal · 3.0mm · 0.59mm/px · 1 of 17 slices shown (3 of 7)]
[im 1/17]
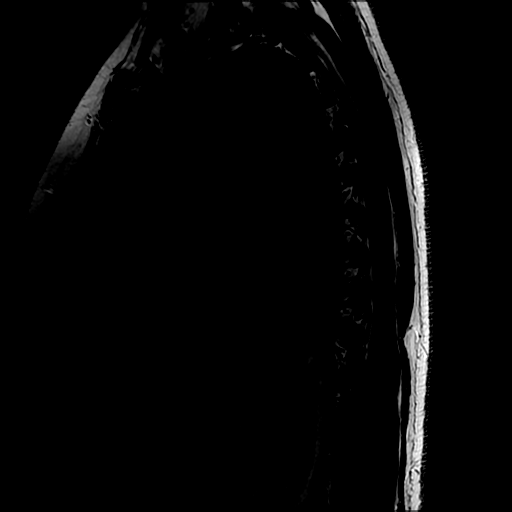

[Series 12: T1 · sagittal · 3.0mm · 0.59mm/px · 1 of 17 slices shown (3 of 4)]
[im 1/17]
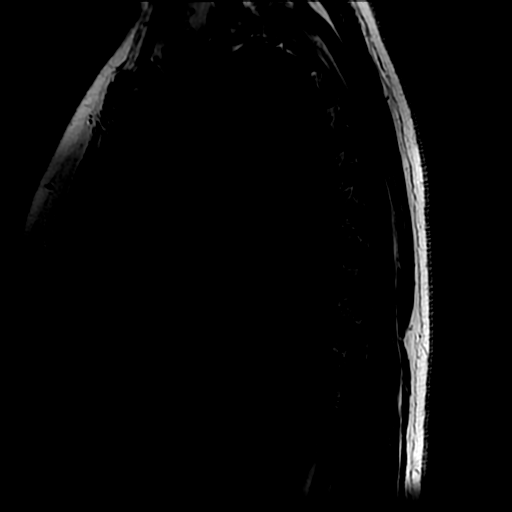

[Series 13: T2 · axial · 4.0mm · 0.39mm/px · z∈[-208,-54]mm · 2 of 30 slices shown (4 of 7)]
[im 1/30]
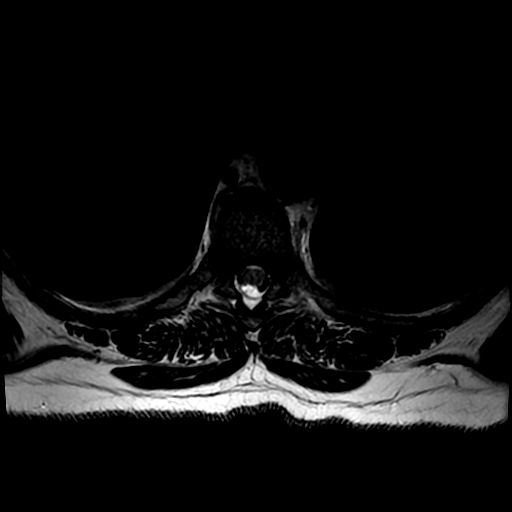
[im 30/30]
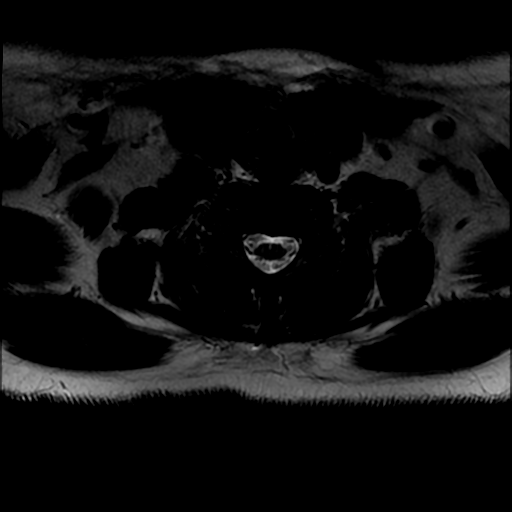

[Series 14: T2 · axial · 4.0mm · 0.39mm/px · z∈[-301,-154]mm · 2 of 28 slices shown (5 of 7)]
[im 1/28]
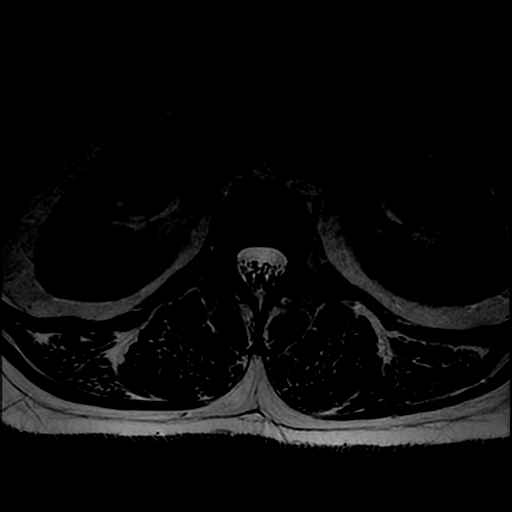
[im 28/28]
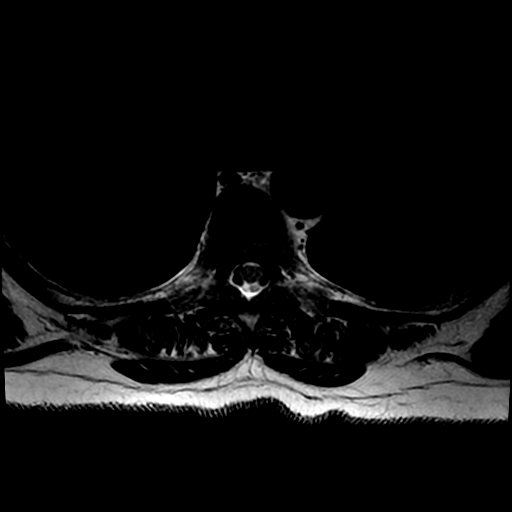

[Series 17: T1 · axial · non-contrast · 4.0mm · 0.39mm/px · z∈[-208,-54]mm · 2 of 30 slices shown (4 of 4)]
[im 1/30]
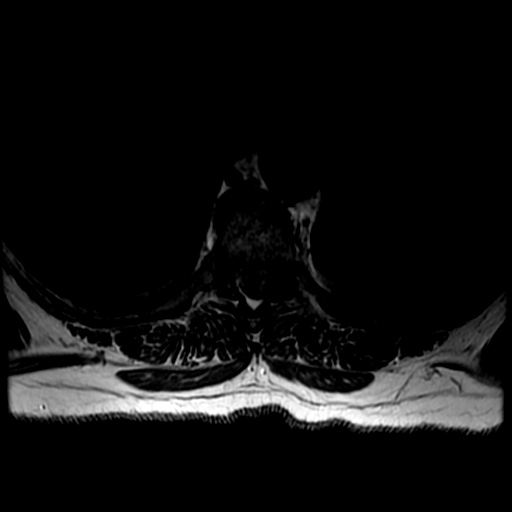
[im 30/30]
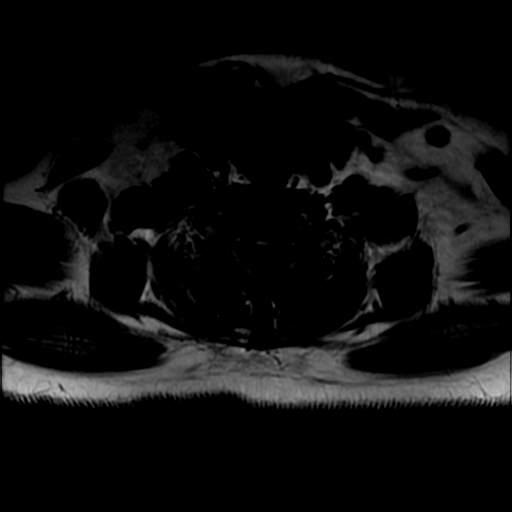

[Series 20: T2 · sagittal · 4.0mm · 0.51mm/px · 1 of 13 slices shown (6 of 7)]
[im 1/13]
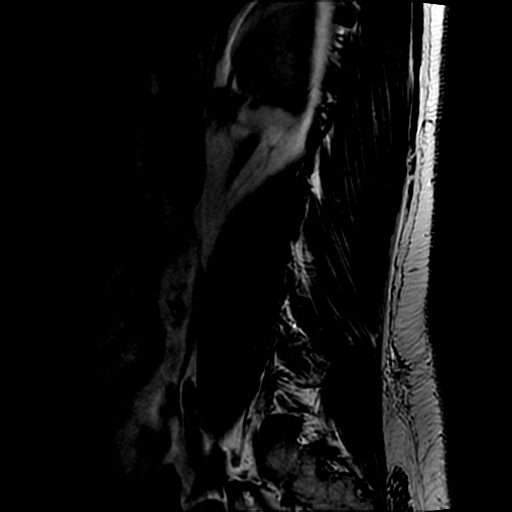

[Series 23: T2 · axial · 4.0mm · 0.39mm/px · z∈[-491,-304]mm · 2 of 35 slices shown (7 of 7)]
[im 1/35]
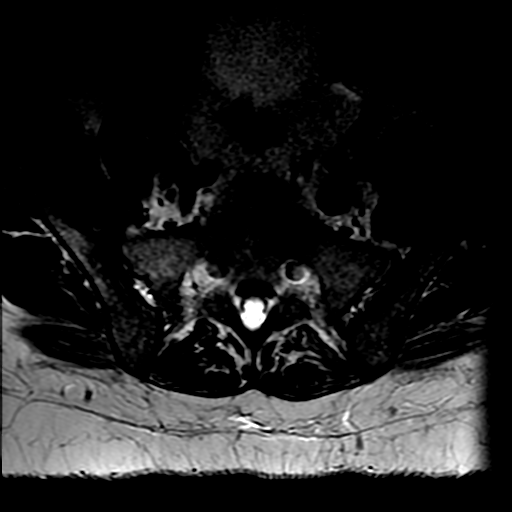
[im 35/35]
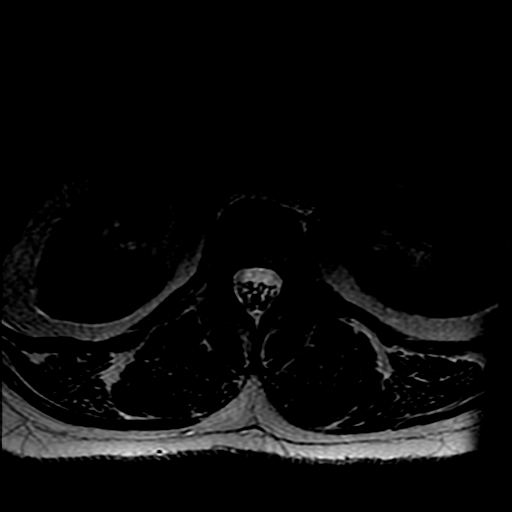

[18 of 48 positions shown; findings below may reference images not displayed]

Soft tissue neck CT 12/30/2008.
Chest CT 05/12/2017. CT abdomen and pelvis 07/17/2014.
FINDINGS: Images are intermittently mildly to moderately motion degraded
throughout examination.

MRI CERVICAL SPINE FINDINGS

Alignment: Cervical spine straightening.  No listhesis.

Vertebrae: No fracture, suspicious osseous lesion, or significant
marrow edema.

Cord: Normal cord signal and morphology. No abnormal intradural
enhancement.

Posterior Fossa, vertebral arteries, paraspinal tissues: Partially
visualized leptomeningeal enhancement in the cerebellum and possibly
along the surface of the ventral brainstem.

Disc levels: Minimal multilevel uncovertebral spurring without
evidence of compressive neural foraminal stenosis. Widely patent
spinal canal.

MRI THORACIC SPINE FINDINGS

Alignment:  Normal.

Vertebrae: No fracture or suspicious osseous lesion. Mild
degenerative endplate changes in the mid and lower thoracic spine
including minimal degenerative edema.

Cord: Normal cord signal and morphology. No abnormal intradural
enhancement.

Paraspinal and other soft tissues: Trace bilateral pleural
effusions.

Disc levels:

Shallow left paracentral to left foraminal disc protrusion at T10-11
without significant stenosis. Minimal spondylosis elsewhere. Focally
prominent right facet arthrosis at T8-9. No compressive neural
foraminal stenosis.

MRI LUMBAR SPINE FINDINGS

Segmentation:  Standard.

Alignment:  Normal.

Vertebrae: No fracture or suspicious osseous lesion. Minimal
degenerative endplate edema at L5-S1.

Conus medullaris and cauda equina: Conus extends to the L1 level.
Conus and cauda equina appear normal. No abnormal intradural
enhancement.

Paraspinal and other soft tissues: Unremarkable.

Disc levels:

L1-2:  Minimal spondylosis.  No disc herniation or stenosis.

L2-3:  Negative.

L3-4: Mild disc desiccation and disc space narrowing. Minimal disc
bulging without stenosis.

L4-5:  Negative.

L5-S1: Disc desiccation. Shallow central disc protrusion with
annular fissure and minimal disc bulging without significant
stenosis.
IMPRESSION: 1. Normal appearance of the spinal cord.
2. Leptomeningeal enhancement in the posterior fossa in keeping with
the additional abnormal findings on recent brain MRI.
3. Minimal spondylosis throughout the spine. No evidence of neural
impingement.

## 2019-10-07 IMAGING — RF DG FLUORO GUIDE LUMBAR PUNCTURE
1 series · 1 of 1 positions shown · non-contrast
Comparison: none

CLINICAL DATA: Headaches and vision changes for the past 2 weeks.
Additional CSF fluid needed for cytology. Failed bedside lumbar
puncture yesterday.

[Series 2: cp_standard · 0.18mm/px · 1 of 1 slices shown]
[im 1/1]
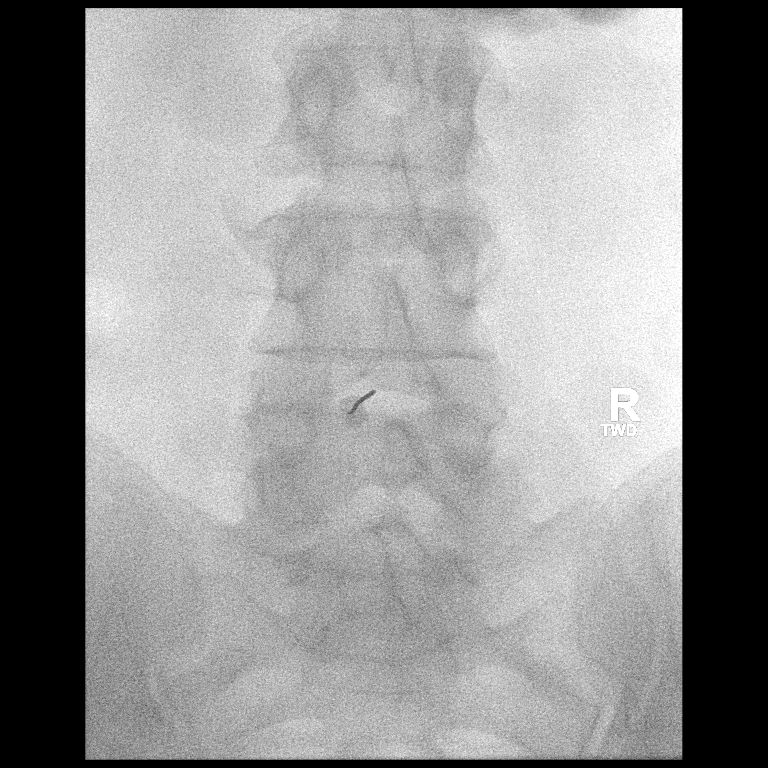

[1 of 1 positions shown; findings below may reference images not displayed]

EXAM:
DIAGNOSTIC LUMBAR PUNCTURE UNDER FLUOROSCOPIC GUIDANCE

FLUOROSCOPY TIME:  Fluoroscopy Time:  12 seconds.

Radiation Exposure Index (if provided by the fluoroscopic device):
1.1 mGy.

Number of Acquired Spot Images: 0

PROCEDURE:
Informed consent was obtained from the patient prior to the
procedure, including potential complications of headache, allergy,
and pain. With the patient prone, the lower back was prepped with
Betadine. 1% Lidocaine was used for local anesthesia. Lumbar
puncture was performed at the L4-L5 level using a 3.5 inch 20 gauge
needle with return of clear, colorless CSF with an opening pressure
of 12 cm water. 13 ml of CSF were obtained for laboratory studies.
The patient tolerated the procedure well and there were no apparent
complications.
IMPRESSION: Technically successful fluoroscopically guided lumbar puncture.

## 2019-10-08 MED FILL — VICTOZA 2-PAK 18 MG/3 ML PE: 18 | 30 days supply | Qty: 6 | Fill #1

## 2019-10-08 MED FILL — LISINOPRIL-HCTZ 20-12.5 MG: 20-12.5 | 90 days supply | Qty: 90 | Fill #0

## 2019-10-11 MED FILL — metFORMIN HCL 500 MG TABS: 500 | 90 days supply | Qty: 180 | Fill #1

## 2019-10-11 MED FILL — ROSUVASTATIN CALCIUM 40 MG: 40 | 90 days supply | Qty: 90 | Fill #1

## 2019-10-11 MED FILL — ALPRAZolam 0.5 MG TABS: 0.5 | 30 days supply | Qty: 30 | Fill #2

## 2019-10-11 MED FILL — PANTOPRAZOLE SOD DR 40 MG T: 40 | 90 days supply | Qty: 90 | Fill #0

## 2019-10-11 MED FILL — AMLODIPINE BESYLATE 10 MG T: 10 | 90 days supply | Qty: 90 | Fill #1

## 2019-11-20 ENCOUNTER — Other Ambulatory Visit: Payer: Self-pay

## 2019-11-20 ENCOUNTER — Encounter: Payer: Self-pay | Admitting: Nurse Practitioner

## 2019-11-20 ENCOUNTER — Telehealth: Payer: Self-pay | Admitting: Nurse Practitioner

## 2019-11-20 ENCOUNTER — Telehealth (INDEPENDENT_AMBULATORY_CARE_PROVIDER_SITE_OTHER): Payer: 59 | Admitting: Nurse Practitioner

## 2019-11-20 DIAGNOSIS — J019 Acute sinusitis, unspecified: Secondary | ICD-10-CM

## 2019-11-20 DIAGNOSIS — B9689 Other specified bacterial agents as the cause of diseases classified elsewhere: Secondary | ICD-10-CM | POA: Diagnosis not present

## 2019-11-20 MED ORDER — AMOXICILLIN-POT CLAVULANATE 875-125 MG PO TABS: 1.0000 | ORAL_TABLET | Freq: Two times a day (BID) | ORAL | 0 refills | Status: DC

## 2019-11-20 MED FILL — AMOX-CLAV 875-125 MG TABLET: 875-125 | 10 days supply | Qty: 20 | Fill #0

## 2019-11-20 NOTE — Progress Notes (Signed)
  VIDEO VISIT  Subjective:    Patient ID: Amy Dean, female    DOB: 1965/12/30, 54 y.o.   MRN: [id number]  Cough This is a new problem. Episode onset: first of the week  Associated symptoms comments: Throat pain, cough, headache, runny nose. Exacerbated by: night time is worse  Treatments tried: OTC Mucinex  The treatment provided no relief.   Virtual Visit via Video Note  I connected with [name] on 11/20/19 at  9:00 AM EDT by a video enabled telemedicine application and verified that I am speaking with the correct person using two identifiers.  Location: Patient: home Provider: office   I discussed the limitations of evaluation and management by telemedicine and the availability of in person appointments. The patient expressed understanding and agreed to proceed.  History of Present Illness: See above. C/o sore throat, bilateral ear pain, cough and runny nose. No fever. Cough worse at night producing some mild wheezing at times. Denies need for inhaler. Now producing green sputum. Sinus headache and pressure. No relief with OTC Mucinex or warm salt water gargles. Taking fluids well. Voiding nl.    Observations/Objective: Format  Patient present at home Provider present at office Consent for interaction obtained Coronavirus outbreak made virtual visit necessary  Alert, oriented. No obvious distress. Calm, making good eye contact. No cough noted during visit.   Assessment and Plan: Acute bacterial rhinosinusitis  Meds ordered this encounter  Medications  . amoxicillin-clavulanate (AUGMENTIN) 875-125 MG tablet    Sig: Take 1 tablet by mouth 2 (two) times daily.    Dispense:  20 tablet    Refill:  0    Order Specific Question:   Supervising Provider    Answer:   Sallee Lange A [[id number]]     Follow Up Instructions: Recommend OTC cough medication with DM for night time.  Start antibiotic as directed.  Warning signs reviewed. Call back next week if no  improvement, sooner if worse.    I discussed the assessment and treatment plan with the patient. The patient was provided an opportunity to ask questions and all were answered. The patient agreed with the plan and demonstrated an understanding of the instructions.   The patient was advised to call back or seek an in-person evaluation if the symptoms worsen or if the condition fails to improve as anticipated.  I provided 15 minutes of non-face-to-face time during this encounter.        Review of Systems  Respiratory: Positive for cough.        Objective:   Physical Exam        Assessment & Plan:

## 2019-11-20 NOTE — Telephone Encounter (Signed)
Ms. Amy Dean, Amy Dean are scheduled for a virtual visit with your provider today.    Just as we do with appointments in the office, we must obtain your consent to participate.  Your consent will be active for this visit and any virtual visit you may have with one of our providers in the next 365 days.    If you have a MyChart account, I can also send a copy of this consent to you electronically.  All virtual visits are billed to your insurance company just like a traditional visit in the office.  As this is a virtual visit, video technology does not allow for your provider to perform a traditional examination.  This may limit your provider's ability to fully assess your condition.  If your provider identifies any concerns that need to be evaluated in person or the need to arrange testing such as labs, EKG, etc, we will make arrangements to do so.    Although advances in technology are sophisticated, we cannot ensure that it will always work on either your end or our end.  If the connection with a video visit is poor, we may have to switch to a telephone visit.  With either a video or telephone visit, we are not always able to ensure that we have a secure connection.   I need to obtain your verbal consent now.   Are you willing to proceed with your visit today?   Amy Dean has provided verbal consent on 11/20/2019 for a virtual visit (video or telephone).   Vicente Males, LPN 3/[id number]/5885  0:27 AM

## 2019-12-09 ENCOUNTER — Other Ambulatory Visit: Payer: Self-pay | Admitting: Family Medicine

## 2019-12-09 MED FILL — UNIFINE PENTIPS 32GX5/32: 32G X 4 MM | 90 days supply | Qty: 100 | Fill #0

## 2019-12-09 MED FILL — VICTOZA 2-PAK 18 MG/3 ML PE: 18 | 30 days supply | Qty: 6 | Fill #2

## 2019-12-11 ENCOUNTER — Encounter: Payer: Self-pay | Admitting: Nurse Practitioner

## 2019-12-11 ENCOUNTER — Telehealth: Payer: Self-pay | Admitting: Family Medicine

## 2019-12-11 NOTE — Telephone Encounter (Signed)
Pt had virtual visit on 7/15 with [name]. She has finished her antibiotic that was prescribed for cold and sore throat. Her throat is still hurting mainly on right side and she is now having right side ear pain. Would like to know if something else can be called in.   Amy Dean

## 2019-12-11 NOTE — Telephone Encounter (Signed)
Pt did want appt. Appt made for tomorrow.

## 2019-12-11 NOTE — Telephone Encounter (Signed)
It is possible this could be referred pain from the sinuses It is possible it still could be an ear infection In my opinion it would be best for the patient be seen tomorrow Recommend office visit with myself or [name] If that is not possible for the patient I can send in 1 week of antibiotics and if ongoing symptoms beyond that she would need to be seen Please talk with the patient and see what she would like to do

## 2019-12-11 NOTE — Telephone Encounter (Signed)
Pt seen in July and prescribed augmentin. Pt states ear pain eased off while taking antibiotic but never went away and now is getting worse. Feels like she bit the back of her tongue and causing ear pain. States it was this way when she saw carolyn. No fever. Feels like there is water in her ear. Can something be called in or does she need office visit.

## 2019-12-12 ENCOUNTER — Encounter: Payer: Self-pay | Admitting: Family Medicine

## 2019-12-12 ENCOUNTER — Ambulatory Visit: Payer: 59 | Admitting: Family Medicine

## 2019-12-12 ENCOUNTER — Other Ambulatory Visit: Payer: Self-pay

## 2019-12-12 VITALS — BP 148/90 | HR 112 | Temp 97.6°F | Wt 197.8 lb

## 2019-12-12 DIAGNOSIS — R070 Pain in throat: Secondary | ICD-10-CM | POA: Diagnosis not present

## 2019-12-12 MED ORDER — SUCRALFATE 1 G PO TABS: ORAL_TABLET | ORAL | 0 refills | Status: DC

## 2019-12-12 NOTE — Progress Notes (Signed)
   Subjective:    Patient ID: Amy Dean, female    DOB: Feb 19, 1966, 54 y.o.   MRN: [id number]  HPI very nice patient Patient comes in today with complaints of sore throat and ear pain x 3 weeks.  She relates pain in the ear region and the posterior throat region denies any reflux symptoms no dysphagia no weight loss fever chills or sweats. Patient finished Augmentin about 2 weeks ago and started to feel better until she stopped taking the medication.    Review of Systems  Constitutional: Negative for activity change, appetite change and fatigue.  HENT: Negative for congestion and rhinorrhea.   Respiratory: Negative for cough and shortness of breath.   Cardiovascular: Negative for chest pain and leg swelling.  Gastrointestinal: Negative for abdominal pain and diarrhea.  Endocrine: Negative for polydipsia and polyphagia.  Skin: Negative for color change.  Neurological: Negative for dizziness and weakness.  Psychiatric/Behavioral: Negative for behavioral problems and confusion.       Objective:   Physical Exam Vitals reviewed.  Constitutional:      General: She is not in acute distress. HENT:     Head: Normocephalic and atraumatic.  Eyes:     General:        Right eye: No discharge.        Left eye: No discharge.  Neck:     Trachea: No tracheal deviation.  Cardiovascular:     Rate and Rhythm: Normal rate and regular rhythm.     Heart sounds: Normal heart sounds. No murmur heard.   Pulmonary:     Effort: Pulmonary effort is normal. No respiratory distress.     Breath sounds: Normal breath sounds.  Lymphadenopathy:     Cervical: No cervical adenopathy.  Skin:    General: Skin is warm and dry.  Neurological:     Mental Status: She is alert.     Coordination: Coordination normal.  Psychiatric:        Behavior: Behavior normal.           Assessment & Plan:  Pharyngeal discomfort on the right side that is involving pain to the base of the tongue as well as  to the right ear probably a localized phenomenon versus reflux issues Recommend adding Carafate 3 times daily avoid large meals close to bedtime avoid tomato based products chocolates Recommend seeing ENT for thorough evaluation of the larynx and neck No antibiotics indicated currently

## 2019-12-17 ENCOUNTER — Encounter: Payer: Self-pay | Admitting: Family Medicine

## 2019-12-18 ENCOUNTER — Encounter (INDEPENDENT_AMBULATORY_CARE_PROVIDER_SITE_OTHER): Payer: Self-pay | Admitting: Internal Medicine

## 2019-12-18 ENCOUNTER — Other Ambulatory Visit: Payer: Self-pay

## 2019-12-18 ENCOUNTER — Ambulatory Visit (INDEPENDENT_AMBULATORY_CARE_PROVIDER_SITE_OTHER): Payer: 59 | Admitting: Internal Medicine

## 2019-12-18 VITALS — BP 146/82 | HR 82 | Temp 97.5°F | Resp 18 | Ht 64.0 in | Wt 194.8 lb

## 2019-12-18 DIAGNOSIS — E785 Hyperlipidemia, unspecified: Secondary | ICD-10-CM | POA: Diagnosis not present

## 2019-12-18 DIAGNOSIS — R5383 Other fatigue: Secondary | ICD-10-CM | POA: Diagnosis not present

## 2019-12-18 DIAGNOSIS — E559 Vitamin D deficiency, unspecified: Secondary | ICD-10-CM

## 2019-12-18 DIAGNOSIS — R5381 Other malaise: Secondary | ICD-10-CM | POA: Diagnosis not present

## 2019-12-18 DIAGNOSIS — F520 Hypoactive sexual desire disorder: Secondary | ICD-10-CM

## 2019-12-18 DIAGNOSIS — N951 Menopausal and female climacteric states: Secondary | ICD-10-CM

## 2019-12-18 DIAGNOSIS — I10 Essential (primary) hypertension: Secondary | ICD-10-CM | POA: Diagnosis not present

## 2019-12-18 DIAGNOSIS — E1169 Type 2 diabetes mellitus with other specified complication: Secondary | ICD-10-CM

## 2019-12-18 MED FILL — ALPRAZolam 0.5 MG TABS: 0.5 | 30 days supply | Qty: 30 | Fill #3

## 2019-12-18 NOTE — Patient Instructions (Signed)
[facility] What to Avoid . Avoid added sugars o Often added sugar can be found in processed foods such as many condiments, dry cereals, cakes, cookies, chips, crisps, crackers, candies, sweetened drinks, etc.  o Read labels and AVOID/DECREASE use of foods with the following in their ingredient list: Sugar, fructose, high fructose corn syrup, sucrose, glucose, maltose, dextrose, molasses, cane sugar, brown sugar, any type of syrup, agave nectar, etc.   . Avoid snacking in between meals . Avoid foods made with flour o If you are going to eat food made with flour, choose those made with whole-grains; and, minimize your consumption as much as is tolerable . Avoid processed foods o These foods are generally stocked in the middle of the grocery store. Focus on shopping on the perimeter of the grocery.  . Avoid Meat  o We recommend following a plant-based diet at [facility]. Thus, we recommend avoiding meat as a general rule. Consider eating beans, legumes, eggs, and/or dairy products for regular protein sources o If you plan on eating meat limit to 4 ounces of meat at a time and choose lean options such as Fish, chicken, turkey. Avoid red meat intake such as pork and/or steak What to Include . Vegetables o GREEN LEAFY VEGETABLES: Kale, spinach, mustard greens, collard greens, cabbage, broccoli, etc. o OTHER: Asparagus, cauliflower, eggplant, carrots, peas, Brussel sprouts, tomatoes, bell peppers, zucchini, beets, cucumbers, etc. . Grains, seeds, and legumes o Beans: kidney beans, black eyed peas, garbanzo beans, black beans, pinto beans, etc. o Whole, unrefined grains: brown rice, barley, bulgur, oatmeal, etc. . Healthy fats  o Avoid highly processed fats such as vegetable oil o Examples of healthy fats: avocado, olives, virgin olive oil, dark chocolate (?72% Cocoa), nuts (peanuts, almonds, walnuts, cashews, pecans, etc.) . None to Low  Intake of Animal Sources of Protein o Meat sources: chicken, turkey, salmon, tuna. Limit to 4 ounces of meat at one time. o Consider limiting dairy sources, but when choosing dairy focus on: PLAIN Greek yogurt, cottage cheese, high-protein milk . Fruit o Choose berries  When to Eat . Intermittent Fasting: o Choosing not to eat for a specific time period, but DO FOCUS ON HYDRATION when fasting o Multiple Techniques: - Time Restricted Eating: eat 3 meals in a day, each meal lasting no more than 60 minutes, no snacks between meals - 16-18 hour fast: fast for 16 to 18 hours up to 7 days a week. Often suggested to start with 2-3 nonconsecutive days per week.  . Remember the time you sleep is counted as fasting.  . Examples of eating schedule: Fast from 7:00pm-11:00am. Eat between 11:00am-7:00pm.  - 24-hour fast: fast for 24 hours up to every other day. Often suggested to start with 1 day per week . Remember the time you sleep is counted as fasting . Examples of eating schedule:  o Eating day: eat 2-3 meals on your eating day. If doing 2 meals, each meal should last no more than 90 minutes. If doing 3 meals, each meal should last no more than 60 minutes. Finish last meal by 7:00pm. o Fasting day: Fast until 7:00pm.  o IF YOU FEEL UNWELL FOR ANY REASON/IN ANY WAY WHEN FASTING, STOP FASTING BY EATING A NUTRITIOUS SNACK OR LIGHT MEAL o ALWAYS FOCUS ON HYDRATION DURING FASTS - Acceptable Hydration sources: water, broths, tea/coffee (black tea/coffee is best but using a small amount of whole-fat dairy products in coffee/tea is acceptable).  -   Poor Hydration Sources: anything with sugar or artificial sweeteners added to it  These Dean have been developed for patients that are actively receiving medical care from either Dr. Trisha Ken or Sarah Gray, DNP, NP-C at [facility]. These Dean are developed for patients with specific medical conditions and are not meant to be  distributed or used by others that are not actively receiving care from either provider listed above at [facility]. It is not appropriate to participate in the above eating plans without proper medical supervision.   Reference: Fung, J. The obesity code. [place]: [place]; 2016.   

## 2019-12-18 NOTE — Progress Notes (Signed)
Metrics: Intervention Frequency ACO  Documented Smoking Status Yearly  Screened one or more times in 24 months  Cessation Counseling or  Active cessation medication Past 24 months  Past 24 months   Guideline developer: UpToDate (See UpToDate for funding source) Date Released: 2014       Wellness Office Visit  Subjective:  Patient ID: [facility], female    DOB: December 25, 1965  Age: 54 y.o. MRN: [id number]  CC: This 54 year old lady comes in as a new patient to establish care.  She wishes to switch from her current primary care physician Dr. Sallee Lange. HPI  She has a history of diabetes type 2 and takes Victoza.  She has not had a hemoglobin A1c done for a long time. She has hypertension, hyperlipidemia, obesity. She has had a hysterectomy in the past but ovaries were left in and she still has hot flashes and decreased libido. Past Medical History:  Diagnosis Date  . Anemia    Resolved post hysterectomy  . Enteritis   . Essential hypertension   . Headache   . Hyperlipidemia   . Type 2 diabetes mellitus ([place])    Past Surgical History:  Procedure Laterality Date  . ABDOMINAL HYSTERECTOMY    . [place]   x2  . COLONOSCOPY N/A 04/22/2015   Procedure: COLONOSCOPY;  Surgeon: Rogene Houston, MD;  [facility];  Service: Endoscopy;  Laterality: N/A;  1:00  . EUS  10/26/2011   Procedure: UPPER ENDOSCOPIC ULTRASOUND (EUS) LINEAR;  Surgeon: Milus Banister, MD;  [facility];  Service: Endoscopy;  Laterality: N/A;  . HYSTEROSCOPY W/ ENDOMETRIAL ABLATION  07/22/2008   K.Harrington Challenger MD [facility]  . SUPRACERVICAL ABDOMINAL HYSTERECTOMY  03/21/2011   Procedure: HYSTERECTOMY SUPRACERVICAL ABDOMINAL;  Surgeon: Jonnie Kind, MD;  Location: [facility];  Service: Gynecology;;  . TONSILLECTOMY    . TUBAL LIGATION       Family History  Problem Relation Age of Onset  . Healthy Son   . Sarcoidosis Mother   . Heart attack Mother   . Stroke Mother   .  Other Father        unsure of history  . Heart attack Paternal Uncle   . Hypertension Maternal Grandmother   . Anesthesia problems Neg Hx   . Hypotension Neg Hx   . Malignant hyperthermia Neg Hx   . Pseudochol deficiency Neg Hx     Social History   Social History Narrative   Lives with her husband,married for 33 years.   Right-handed.   Occasional soda (1-2 times per week).   Works at [employer].   Social History   Tobacco Use  . Smoking status: Never Smoker  . Smokeless tobacco: Never Used  Substance Use Topics  . Alcohol use: Yes    Alcohol/week: 12.0 standard drinks    Types: 12 Cans of beer per week    Current Meds  Medication Sig  . acetaminophen (TYLENOL) 500 MG tablet Take 500 mg by mouth every 8 (eight) hours as needed for mild pain. Reported on 07/12/2015  . ALPRAZolam (XANAX) 0.5 MG tablet Take 1 tablet (0.5 mg total) by mouth at bedtime as needed. for sleep  . amLODipine (NORVASC) 10 MG tablet Take 1 tablet (10 mg total) by mouth daily.  Marland Kitchen aspirin 81 MG tablet Take 81 mg by mouth daily.  . cetirizine (ZYRTEC) 10 MG tablet Take 1 tablet (10 mg total) by mouth daily as needed  for allergies.  . cholecalciferol (VITAMIN D) 1000 units tablet Take 1,000 Units by mouth daily.  . citalopram (CELEXA) 20 MG tablet Take one tablet po daily  . fexofenadine (ALLEGRA) 180 MG tablet Take 1 tablet (180 mg total) by mouth daily.  . fluticasone (FLONASE) 50 MCG/ACT nasal spray Place 2 sprays into both nostrils daily as needed for allergies.  . Insulin Pen Needle (PEN NEEDLES 31GX5/16") 31G X 8 MM MISC Use daily as directed  . ketoconazole (NIZORAL) 2 % cream Apply 1 application topically 3 (three) times a week.  . liraglutide (VICTOZA) 18 MG/3ML SOPN INJECT 1.2 MG UNDER THE SKIN DAILY  . lisinopril-hydrochlorothiazide (ZESTORETIC) 20-12.5 MG tablet TAKE 1 TABLET BY MOUTH DAILY FOR FOR BLOOD PRESSURE  . metFORMIN (GLUCOPHAGE) 500 MG tablet Take 2 po daily with  breakfast  . omega-3 acid ethyl esters (LOVAZA) 1 g capsule TAKE 1 CAPSULE BY MOUTH 2 TIMES DAILY.  . pantoprazole (PROTONIX) 40 MG tablet TAKE 1 TABLET BY MOUTH ONCE DAILY FOR ACID REFLUX  . rosuvastatin (CRESTOR) 40 MG tablet Take 1 tablet (40 mg total) by mouth daily.  . sucralfate (CARAFATE) 1 g tablet Use 3 times a day for throat pain  . tobramycin-dexamethasone (TOBRADEX) ophthalmic solution Place 1 drop into both eyes every 4 (four) hours while awake.  . triamcinolone cream (KENALOG) 0.1 % Apply 1 application topically 3 (three) times a week.  Marland Kitchen UNIFINE PENTIPS 32G X 4 MM MISC USE DAILY AS DIRECTED      Depression screen [facility] 2/9 01/04/2019 12/26/2018  Decreased Interest 3 3  Down, Depressed, Hopeless 0 0  PHQ - 2 Score 3 3  Altered sleeping 3 3  Tired, decreased energy 2 2  Change in appetite 2 2  Feeling bad or failure about yourself  0 0  Trouble concentrating 0 0  Moving slowly or fidgety/restless 3 3  Suicidal thoughts 0 0  PHQ-9 Score 13 13  Difficult doing work/chores Somewhat difficult Somewhat difficult  Some encounter information is confidential and restricted. Go to Review Flowsheets activity to see all data.     Objective:   Today's Vitals: BP (!) 146/82 (BP Location: Left Arm, Patient Position: Sitting, Cuff Size: Normal)   Pulse 82   Temp (!) 97.5 F (36.4 C) (Temporal)   Resp 18   Ht 5\' 4"  (1.626 m)   Wt 194 lb 12.8 oz (88.4 kg)   LMP 02/10/2011   SpO2 98% Comment: wear mask.  BMI 33.44 kg/m  Vitals with BMI 12/18/2019 12/12/2019 12/26/2018  Height 5\' 4"  - 5\' 4"   Weight 194 lbs 13 oz 197 lbs 13 oz 202 lbs 6 oz  BMI 62.56 - 38.93  Systolic 734 287 681  Diastolic 82 90 82  Pulse 82 112 -  Some encounter information is confidential and restricted. Go to Review Flowsheets activity to see all data.     Physical Exam  She looks systemically well, remains obese.  Blood pressure on the higher side today.  Alert and orientated without any obvious focal  neurological signs.     Assessment   1. Hyperlipidemia associated with type 2 diabetes mellitus ([place])   2. Essential hypertension   3. Hot flashes due to menopause   4. Hypoactive sexual desire disorder   5. Malaise and fatigue   6. Vitamin D deficiency disease       Tests ordered Orders Placed This Encounter  Procedures  . CBC  . COMPLETE METABOLIC PANEL WITH GFR  . Estradiol  .  Progesterone  . Testos,Total,Free and SHBG (Female)  . Hemoglobin A1c  . Lipid panel  . VITAMIN D 25 Hydroxy (Vit-D Deficiency, Fractures)  . T3, free  . T4  . TSH     Plan: 1. Blood work is ordered. 2. She will continue her Victoza for her diabetes. 3. She will continue all antihypertensive medications are listed above for her hypertension for the time being. 4. I told her that she needs to drink about 100 ounces of water every day and eliminate all added sugars from her diet from now on and including soda drinks and even diet soda drinks.  I have also told her to reduce the amount of meat that she eats every week from 4 times a week to twice a week now. 5. I will see her in the several weeks to discuss all of the results and further recommendations   No orders of the defined types were placed in this encounter.   Doree Albee, MD

## 2019-12-19 MED FILL — LISINOPRIL-HCTZ 20-12.5 MG: 20-12.5 | 90 days supply | Qty: 90 | Fill #1

## 2019-12-21 LAB — COMPLETE METABOLIC PANEL WITH GFR
AG Ratio: 1.7 (calc) (ref 1.0–2.5)
ALT: 31 U/L — ABNORMAL HIGH (ref 6–29)
AST: 34 U/L (ref 10–35)
Albumin: 4.7 g/dL (ref 3.6–5.1)
Alkaline phosphatase (APISO): 70 U/L (ref 37–153)
BUN: 13 mg/dL (ref 7–25)
CO2: 25 mmol/L (ref 20–32)
Calcium: 10 mg/dL (ref 8.6–10.4)
Chloride: 102 mmol/L (ref 98–110)
Creat: 0.56 mg/dL (ref 0.50–1.05)
GFR, Est African American: 122 mL/min/{1.73_m2} (ref >=60)
GFR, Est Non African American: 105 mL/min/{1.73_m2} (ref >=60)
Globulin: 2.7 g/dL (calc) (ref 1.9–3.7)
Glucose, Bld: 115 mg/dL — ABNORMAL HIGH (ref 65–99)
Potassium: 4.1 mmol/L (ref 3.5–5.3)
Sodium: 138 mmol/L (ref 135–146)
Total Bilirubin: 0.6 mg/dL (ref 0.2–1.2)
Total Protein: 7.4 g/dL (ref 6.1–8.1)

## 2019-12-21 LAB — LIPID PANEL
Cholesterol: 143 mg/dL (ref <200)
HDL: 58 mg/dL (ref >=50)
LDL Cholesterol (Calc): 64 mg/dL (calc)
Non-HDL Cholesterol (Calc): 85 mg/dL (calc) (ref <130)
Total CHOL/HDL Ratio: 2.5 (calc) (ref <5.0)
Triglycerides: 120 mg/dL (ref <150)

## 2019-12-21 LAB — CBC
HCT: 40.8 % (ref 35.0–45.0)
Hemoglobin: 13.7 g/dL (ref 11.7–15.5)
MCH: 30.9 pg (ref 27.0–33.0)
MCHC: 33.6 g/dL (ref 32.0–36.0)
MCV: 91.9 fL (ref 80.0–100.0)
MPV: 9.7 fL (ref 7.5–12.5)
Platelets: 180 10*3/uL (ref 140–400)
RBC: 4.44 10*6/uL (ref 3.80–5.10)
RDW: 13.1 % (ref 11.0–15.0)
WBC: 5 10*3/uL (ref 3.8–10.8)

## 2019-12-21 LAB — TESTOS,TOTAL,FREE AND SHBG (FEMALE)
Free Testosterone: 2.9 pg/mL (ref 0.1–6.4)
Sex Hormone Binding: 17 nmol/L (ref 17–124)
Testosterone, Total, LC-MS-MS: 18 ng/dL (ref 2–45)

## 2019-12-21 LAB — ESTRADIOL: Estradiol: 16 pg/mL

## 2019-12-21 LAB — TSH: TSH: 1.48 mIU/L

## 2019-12-21 LAB — PROGESTERONE: Progesterone: <0.5 ng/mL

## 2019-12-21 LAB — HEMOGLOBIN A1C
Hgb A1c MFr Bld: 7.2 % of total Hgb — ABNORMAL HIGH (ref <5.7)
Mean Plasma Glucose: 160 (calc)
eAG (mmol/L): 8.9 (calc)

## 2019-12-21 LAB — VITAMIN D 25 HYDROXY (VIT D DEFICIENCY, FRACTURES): Vit D, 25-Hydroxy: 31 ng/mL (ref 30–100)

## 2019-12-21 LAB — T4: T4, Total: 7.3 ug/dL (ref 5.1–11.9)

## 2019-12-21 LAB — T3, FREE: T3, Free: 3.7 pg/mL (ref 2.3–4.2)

## 2019-12-22 MED FILL — AMLODIPINE BESYLATE 10 MG T: 10 | 90 days supply | Qty: 90 | Fill #0

## 2020-02-02 ENCOUNTER — Other Ambulatory Visit: Payer: Self-pay | Admitting: Family Medicine

## 2020-02-02 MED FILL — VICTOZA 2-PAK 18 MG/3 ML PE: 18 | 30 days supply | Qty: 6 | Fill #3

## 2020-02-03 NOTE — Telephone Encounter (Signed)
This patient is no longer a patient in our practice.  She is followed by Dr. Anastasio Champion

## 2020-02-04 ENCOUNTER — Other Ambulatory Visit: Payer: Self-pay | Admitting: Family Medicine

## 2020-02-04 MED FILL — ALPRAZolam 0.5 MG TABS: 0.5 | 30 days supply | Qty: 30 | Fill #0

## 2020-02-11 ENCOUNTER — Other Ambulatory Visit: Payer: Self-pay

## 2020-02-11 ENCOUNTER — Encounter (INDEPENDENT_AMBULATORY_CARE_PROVIDER_SITE_OTHER): Payer: Self-pay | Admitting: Internal Medicine

## 2020-02-11 ENCOUNTER — Ambulatory Visit (INDEPENDENT_AMBULATORY_CARE_PROVIDER_SITE_OTHER): Payer: 59 | Admitting: Internal Medicine

## 2020-02-11 ENCOUNTER — Other Ambulatory Visit (INDEPENDENT_AMBULATORY_CARE_PROVIDER_SITE_OTHER): Payer: Self-pay | Admitting: Internal Medicine

## 2020-02-11 VITALS — BP 160/76 | HR 72 | Ht 64.0 in | Wt 194.4 lb

## 2020-02-11 DIAGNOSIS — E785 Hyperlipidemia, unspecified: Secondary | ICD-10-CM | POA: Diagnosis not present

## 2020-02-11 DIAGNOSIS — I10 Essential (primary) hypertension: Secondary | ICD-10-CM

## 2020-02-11 DIAGNOSIS — E1169 Type 2 diabetes mellitus with other specified complication: Secondary | ICD-10-CM

## 2020-02-11 DIAGNOSIS — N951 Menopausal and female climacteric states: Secondary | ICD-10-CM

## 2020-02-11 DIAGNOSIS — F520 Hypoactive sexual desire disorder: Secondary | ICD-10-CM

## 2020-02-11 MED ORDER — PROGESTERONE MICRONIZED 100 MG PO CAPS: 100.0000 mg | ORAL_CAPSULE | Freq: Every day | ORAL | 3 refills | Status: DC

## 2020-02-11 MED ORDER — ESTRADIOL 0.5 MG PO TABS: 0.5000 mg | ORAL_TABLET | Freq: Every day | ORAL | 3 refills | Status: DC

## 2020-02-11 MED FILL — PROGESTERONE 100 MG CAPSULE: 100 | 30 days supply | Qty: 30 | Fill #0

## 2020-02-11 MED FILL — ESTRADIOL 0.5 MG TABS: 0.5 | 30 days supply | Qty: 30 | Fill #0

## 2020-02-11 NOTE — Progress Notes (Signed)
Metrics: Intervention Frequency ACO  Documented Smoking Status Yearly  Screened one or more times in 24 months  Cessation Counseling or  Active cessation medication Past 24 months  Past 24 months   Guideline developer: UpToDate (See UpToDate for funding source) Date Released: 2014       Wellness Office Visit  Subjective:  Patient ID: Amy [facility], female    DOB: 25-Jul-1965  Age: 54 y.o. MRN: [id number]  CC: This lady comes back to discuss her blood work and further recommendations. HPI  Her hemoglobin A1c is 7.2%. Her vitamin D level is suboptimal. She continues to have hot flashes and some insomnia as well as decreased libido.  Her estradiol and progesterone levels are extremely low and testosterone levels are suboptimal. Past Medical History:  Diagnosis Date  . Anemia    Resolved post hysterectomy  . Enteritis   . Essential hypertension   . Headache   . Hyperlipidemia   . Type 2 diabetes mellitus ([place])    Past Surgical History:  Procedure Laterality Date  . ABDOMINAL HYSTERECTOMY    . [place]   x2  . COLONOSCOPY N/A 04/22/2015   Procedure: COLONOSCOPY;  Surgeon: Rogene Houston, MD;  [facility];  Service: Endoscopy;  Laterality: N/A;  1:00  . EUS  10/26/2011   Procedure: UPPER ENDOSCOPIC ULTRASOUND (EUS) LINEAR;  Surgeon: Milus Banister, MD;  [facility];  Service: Endoscopy;  Laterality: N/A;  . HYSTEROSCOPY W/ ENDOMETRIAL ABLATION  07/22/2008   K.Harrington Challenger MD [facility]  . SUPRACERVICAL ABDOMINAL HYSTERECTOMY  03/21/2011   Procedure: HYSTERECTOMY SUPRACERVICAL ABDOMINAL;  Surgeon: Jonnie Kind, MD;  Location: [facility];  Service: Gynecology;;  . TONSILLECTOMY    . TUBAL LIGATION       Family History  Problem Relation Age of Onset  . Healthy Son   . Sarcoidosis Mother   . Heart attack Mother   . Stroke Mother   . Other Father        unsure of history  . Heart attack Paternal Uncle   . Hypertension Maternal  Grandmother   . Anesthesia problems Neg Hx   . Hypotension Neg Hx   . Malignant hyperthermia Neg Hx   . Pseudochol deficiency Neg Hx     Social History   Social History Narrative   Lives with her husband,married for 33 years.   Right-handed.   Occasional soda (1-2 times per week).   Works at [employer].   Social History   Tobacco Use  . Smoking status: Never Smoker  . Smokeless tobacco: Never Used  Substance Use Topics  . Alcohol use: Yes    Alcohol/week: 12.0 standard drinks    Types: 12 Cans of beer per week    Current Meds  Medication Sig  . ALPRAZolam (XANAX) 0.5 MG tablet TAKE 1 TABLET (0.5 MG TOTAL) BY MOUTH AT BEDTIME AS NEEDED FOR SLEEP  . amLODipine (NORVASC) 10 MG tablet Take 1 tablet (10 mg total) by mouth daily.  Marland Kitchen aspirin 81 MG tablet Take 81 mg by mouth daily.  . cholecalciferol (VITAMIN D) 1000 units tablet Take 1,000 Units by mouth daily.  . citalopram (CELEXA) 20 MG tablet Take one tablet po daily  . Insulin Pen Needle (PEN NEEDLES 31GX5/16") 31G X 8 MM MISC Use daily as directed  . liraglutide (VICTOZA) 18 MG/3ML SOPN INJECT 1.2 MG UNDER THE SKIN DAILY  . lisinopril-hydrochlorothiazide (ZESTORETIC) 20-12.5 MG tablet TAKE 1 TABLET BY  MOUTH DAILY FOR FOR BLOOD PRESSURE  . metFORMIN (GLUCOPHAGE) 500 MG tablet Take 2 po daily with breakfast  . omega-3 acid ethyl esters (LOVAZA) 1 g capsule TAKE 1 CAPSULE BY MOUTH 2 TIMES DAILY.  . pantoprazole (PROTONIX) 40 MG tablet TAKE 1 TABLET BY MOUTH ONCE DAILY FOR ACID REFLUX  . rosuvastatin (CRESTOR) 40 MG tablet Take 1 tablet (40 mg total) by mouth daily.  Marland Kitchen UNIFINE PENTIPS 32G X 4 MM MISC USE DAILY AS DIRECTED      Depression screen [facility] 2/9 01/04/2019 12/26/2018  Decreased Interest 3 3  Down, Depressed, Hopeless 0 0  PHQ - 2 Score 3 3  Altered sleeping 3 3  Tired, decreased energy 2 2  Change in appetite 2 2  Feeling bad or failure about yourself  0 0  Trouble concentrating 0 0  Moving  slowly or fidgety/restless 3 3  Suicidal thoughts 0 0  PHQ-9 Score 13 13  Difficult doing work/chores Somewhat difficult Somewhat difficult  Some encounter information is confidential and restricted. Go to Review Flowsheets activity to see all data.     Objective:   Today's Vitals: BP (!) 160/76   Pulse 72   Ht 5\' 4"  (1.626 m)   Wt 194 lb 6.4 oz (88.2 kg)   LMP 02/10/2011   BMI 33.37 kg/m  Vitals with BMI 02/11/2020 12/18/2019 12/12/2019  Height 5\' 4"  5\' 4"  -  Weight 194 lbs 6 oz 194 lbs 13 oz 197 lbs 13 oz  BMI 87.56 43.32 -  Systolic 951 884 166  Diastolic 76 82 90  Pulse 72 82 112  Some encounter information is confidential and restricted. Go to Review Flowsheets activity to see all data.     Physical Exam  She remains obese and has not lost any weight.  Blood pressure is somewhat elevated today.     Assessment   1. Essential hypertension   2. Hot flashes due to menopause   3. Hypoactive sexual desire disorder   4. Hyperlipidemia associated with type 2 diabetes mellitus ([place])       Tests ordered No orders of the defined types were placed in this encounter.    Plan: 1. We discussed nutrition in more detail and the concept of intermittent fasting combined with a plant-based diet. 2. In terms of her menopausal symptoms, she is willing to try estradiol and progesterone and I explained the benefits and safety of bioidentical hormones that she is going to be prescribed. 3. I will see her in about 6 weeks time to see how she is doing and we will do all the blood work then.   Meds ordered this encounter  Medications  . estradiol (ESTRACE) 0.5 MG tablet    Sig: Take 1 tablet (0.5 mg total) by mouth daily.    Dispense:  30 tablet    Refill:  3  . progesterone (PROMETRIUM) 100 MG capsule    Sig: Take 1 capsule (100 mg total) by mouth daily.    Dispense:  30 capsule    Refill:  3    Winola Drum Luther Parody, MD

## 2020-03-08 ENCOUNTER — Other Ambulatory Visit: Payer: Self-pay | Admitting: Family Medicine

## 2020-03-08 MED FILL — METFORMIN HCL 500 MG TABS: 500 | 90 days supply | Qty: 180 | Fill #0

## 2020-03-08 MED FILL — ROSUVASTATIN CALCIUM 40 MG: 40 | 90 days supply | Qty: 90 | Fill #0

## 2020-03-08 MED FILL — ALPRAZolam 0.5 MG TABS: 0.5 | 30 days supply | Qty: 30 | Fill #1

## 2020-03-12 MED FILL — AMLODIPINE BESYLATE 10 MG T: 10 | 90 days supply | Qty: 90 | Fill #1

## 2020-03-18 ENCOUNTER — Other Ambulatory Visit: Payer: Self-pay | Admitting: Family Medicine

## 2020-03-18 MED FILL — PROGESTERONE 100 MG CAPSULE: 100 | 30 days supply | Qty: 30 | Fill #1

## 2020-03-18 MED FILL — VICTOZA 2-PAK 18 MG/3 ML PE: 18 | 30 days supply | Qty: 6 | Fill #4

## 2020-03-18 MED FILL — ESTRADIOL 0.5 MG TABS: 0.5 | 30 days supply | Qty: 30 | Fill #1

## 2020-03-22 ENCOUNTER — Other Ambulatory Visit (INDEPENDENT_AMBULATORY_CARE_PROVIDER_SITE_OTHER): Payer: Self-pay | Admitting: Internal Medicine

## 2020-03-22 ENCOUNTER — Telehealth (INDEPENDENT_AMBULATORY_CARE_PROVIDER_SITE_OTHER): Payer: Self-pay

## 2020-03-22 MED ORDER — UNIFINE PENTIPS 32G X 4 MM MISC: 0 refills | Status: DC

## 2020-03-22 MED ORDER — LISINOPRIL-HYDROCHLOROTHIAZIDE 20-12.5 MG PO TABS: ORAL_TABLET | ORAL | 1 refills | Status: DC

## 2020-03-22 MED FILL — UNIFINE PENTIPS 32GX5/32: 32G X 4 MM | 90 days supply | Qty: 100 | Fill #0

## 2020-03-22 MED FILL — LISINOPRIL-HCTZ 20-12.5 MG: 20-12.5 | 90 days supply | Qty: 90 | Fill #0

## 2020-03-22 NOTE — Telephone Encounter (Signed)
[facility] sent refill requests for the following medications:  lisinopril-hydrochlorothiazide (ZESTORETIC) 20-12.5 MG tablet Last filled 07/23/2019, # 90 with 1 refill (by Dr. Sallee Lange, MD)  Terrilee Croak PENTIPS 32G X 4 MM MISC  Last filled on 12/09/2019, # 100 with 0 refills (by Dr. Sallee Lange, MD)  Last OV 02/11/2020  ROV 04/14/2020

## 2020-03-30 ENCOUNTER — Telehealth (INDEPENDENT_AMBULATORY_CARE_PROVIDER_SITE_OTHER): Payer: Self-pay

## 2020-03-30 NOTE — Telephone Encounter (Signed)
Patient called to cancel her appointment in December. Patient wants to stay with Dr. Sallee Lange and will not be coming back. Sending as Juluis Rainier.

## 2020-04-06 ENCOUNTER — Encounter: Payer: Self-pay | Admitting: Family Medicine

## 2020-04-06 ENCOUNTER — Ambulatory Visit (HOSPITAL_COMMUNITY)
Admission: RE | Admit: 2020-04-06 | Discharge: 2020-04-06 | Disposition: A | Discharge status: Home / Self Care | Payer: 59 | Source: Ambulatory Visit | Attending: Family Medicine | Admitting: Family Medicine

## 2020-04-06 ENCOUNTER — Other Ambulatory Visit (HOSPITAL_COMMUNITY)
Admission: RE | Admit: 2020-04-06 | Discharge: 2020-04-06 | Disposition: A | Discharge status: Home / Self Care | Payer: 59 | Source: Ambulatory Visit | Attending: Family Medicine | Admitting: Family Medicine

## 2020-04-06 ENCOUNTER — Ambulatory Visit (INDEPENDENT_AMBULATORY_CARE_PROVIDER_SITE_OTHER): Payer: 59 | Admitting: Family Medicine

## 2020-04-06 ENCOUNTER — Other Ambulatory Visit: Payer: Self-pay

## 2020-04-06 VITALS — BP 132/84 | HR 83 | Temp 98.8°F | Ht 64.0 in | Wt 194.0 lb

## 2020-04-06 DIAGNOSIS — N63 Unspecified lump in unspecified breast: Secondary | ICD-10-CM | POA: Diagnosis not present

## 2020-04-06 DIAGNOSIS — M7989 Other specified soft tissue disorders: Secondary | ICD-10-CM | POA: Insufficient documentation

## 2020-04-06 LAB — D-DIMER, QUANTITATIVE: D-Dimer, Quant: 0.31 ug/mL-FEU (ref 0.00–0.50)

## 2020-04-06 NOTE — Progress Notes (Signed)
Patient ID: Amy [facility], female    DOB: 01-10-1966, 54 y.o.   MRN: [id number]   Chief Complaint  Patient presents with  . Cyst   Subjective:  CC: left breast "knot" and right leg weakness and sorness  This is a new problem #1.  Presents today with a left breast "knot "that is not painful, has had no drainage no warmth he noticed symptoms 3 weeks ago.  This is a new problem #2 right leg "weakness "with a tender "knot "on the medial aspect beside of the knee.  Onset for the right leg issue started on Sunday.  She does not have a history of DVT, she is on her feet most days for her job, no new activities no prolonged car ride, airplane ride.  No chest pain, no shortness of breath no fever no chills. She is not aware of swelling.    knot under left breast. Noticed it about 2 weeks ago.   Knot on right leg. Noticed it 2 days ago.    Medical History Amy Dean has a past medical history of Anemia, Enteritis, Essential hypertension, Headache, Hyperlipidemia, and Type 2 diabetes mellitus ([place]).   Outpatient Encounter Medications as of 04/06/2020  Medication Sig  . ALPRAZolam (XANAX) 0.5 MG tablet TAKE 1 TABLET (0.5 MG TOTAL) BY MOUTH AT BEDTIME AS NEEDED FOR SLEEP  . amLODipine (NORVASC) 10 MG tablet TAKE 1 TABLET (10 MG TOTAL) BY MOUTH DAILY.  Marland Kitchen aspirin 81 MG tablet Take 81 mg by mouth daily.  . cholecalciferol (VITAMIN D) 1000 units tablet Take 1,000 Units by mouth daily.  . citalopram (CELEXA) 20 MG tablet Take one tablet po daily  . estradiol (ESTRACE) 0.5 MG tablet Take 1 tablet (0.5 mg total) by mouth daily.  . Insulin Pen Needle (PEN NEEDLES 31GX5/16") 31G X 8 MM MISC Use daily as directed  . Insulin Pen Needle (UNIFINE PENTIPS) 32G X 4 MM MISC USE DAILY AS DIRECTED  . liraglutide (VICTOZA) 18 MG/3ML SOPN INJECT 1.2 MG UNDER THE SKIN DAILY  . lisinopril-hydrochlorothiazide (ZESTORETIC) 20-12.5 MG tablet TAKE 1 TABLET BY MOUTH DAILY FOR FOR BLOOD PRESSURE  . metFORMIN  (GLUCOPHAGE) 500 MG tablet Take 2 po daily with breakfast  . omega-3 acid ethyl esters (LOVAZA) 1 g capsule TAKE 1 CAPSULE BY MOUTH 2 TIMES DAILY.  . pantoprazole (PROTONIX) 40 MG tablet TAKE 1 TABLET BY MOUTH ONCE DAILY FOR ACID REFLUX  . progesterone (PROMETRIUM) 100 MG capsule Take 1 capsule (100 mg total) by mouth daily.  . rosuvastatin (CRESTOR) 40 MG tablet Take 1 tablet (40 mg total) by mouth daily.   No facility-administered encounter medications on file as of 04/06/2020.     Review of Systems  Constitutional: Negative for chills and fever.  Respiratory: Negative for shortness of breath.   Cardiovascular: Negative for chest pain and leg swelling.     Vitals BP 132/84   Pulse 83   Temp 98.8 F (37.1 C)   Ht 5\' 4"  (1.626 m)   Wt 194 lb (88 kg)   LMP 02/10/2011   SpO2 99%   BMI 33.30 kg/m   Objective:   Physical Exam Vitals and nursing note reviewed.  Constitutional:      General: She is not in acute distress.    Appearance: Normal appearance.  Cardiovascular:     Rate and Rhythm: Normal rate and regular rhythm.     Heart sounds: Normal heart sounds.  Pulmonary:     Effort: Pulmonary effort is normal.  Breath sounds: Normal breath sounds.  Musculoskeletal:        General: Tenderness present.     Right lower leg: No edema.     Left lower leg: No edema.     Comments: Right calf measurement approximately 15 inches. Left calf measurement approximately 14 inches. Right medial aspect inside of the knee tenderness, and obvious swelling.  Skin:    General: Skin is warm and dry.  Neurological:     Mental Status: She is alert and oriented to person, place, and time.  Psychiatric:        Mood and Affect: Mood normal.        Behavior: Behavior normal.        Thought Content: Thought content normal.        Judgment: Judgment normal.      Assessment and Plan   1. Right leg swelling - US Venous Img Lower Unilateral Right (DVT) - D-dimer, quantitative (not at  [facility])  2. Breast nodule - MM DIAG BREAST TOMO BILATERAL   There is immediate concern for possible right lower extremity DVT, will send her to [facility] for stat D-dimer and ultrasound of the lower extremity.  Update: d-dimer negative and right leg ultrasound negative for DVT. Message sent to patient to treat with RICE and to follow-up in 2-3 weeks if not improved or worsens.   She is due for her screening mammogram in January, will initiate a mammogram sooner.  She received her Covid booster on November 4, will need to wait 6 weeks before she can have a mammogram.  Agrees with plan of care discussed today. Understands warning signs to seek further care: chest pain, shortness of breath, any significant changes. Understands to follow-up in 2-3 weeks if not improved. Let us know if symptoms worsen.  Amy Ades, FNP-C 04/06/2020

## 2020-04-07 MED FILL — ALPRAZolam 0.5 MG TABS: 0.5 | 30 days supply | Qty: 30 | Fill #2

## 2020-04-14 ENCOUNTER — Ambulatory Visit (INDEPENDENT_AMBULATORY_CARE_PROVIDER_SITE_OTHER): Payer: 59 | Admitting: Internal Medicine

## 2020-04-14 ENCOUNTER — Other Ambulatory Visit (HOSPITAL_COMMUNITY): Payer: Self-pay | Admitting: Family Medicine

## 2020-04-14 DIAGNOSIS — N63 Unspecified lump in unspecified breast: Secondary | ICD-10-CM

## 2020-04-20 ENCOUNTER — Ambulatory Visit (HOSPITAL_COMMUNITY)
Admission: RE | Admit: 2020-04-20 | Discharge: 2020-04-20 | Disposition: A | Discharge status: Home / Self Care | Payer: 59 | Source: Ambulatory Visit | Attending: Family Medicine | Admitting: Family Medicine

## 2020-04-20 ENCOUNTER — Other Ambulatory Visit: Payer: Self-pay

## 2020-04-20 DIAGNOSIS — N6001 Solitary cyst of right breast: Secondary | ICD-10-CM | POA: Diagnosis not present

## 2020-04-20 DIAGNOSIS — N63 Unspecified lump in unspecified breast: Secondary | ICD-10-CM | POA: Insufficient documentation

## 2020-04-28 MED FILL — VICTOZA 2-PAK 18 MG/3 ML PE: 18 | 30 days supply | Qty: 6 | Fill #5

## 2020-05-04 ENCOUNTER — Encounter (HOSPITAL_COMMUNITY): Payer: 59

## 2020-05-05 ENCOUNTER — Encounter: Payer: Self-pay | Admitting: Family Medicine

## 2020-05-05 ENCOUNTER — Ambulatory Visit: Payer: 59 | Admitting: Family Medicine

## 2020-05-05 ENCOUNTER — Other Ambulatory Visit: Payer: Self-pay

## 2020-05-05 VITALS — BP 144/90 | HR 94 | Temp 97.2°F | Ht 64.0 in | Wt 200.0 lb

## 2020-05-05 DIAGNOSIS — L03012 Cellulitis of left finger: Secondary | ICD-10-CM

## 2020-05-05 MED ORDER — DOXYCYCLINE HYCLATE 100 MG PO TABS: 100.0000 mg | ORAL_TABLET | Freq: Two times a day (BID) | ORAL | 0 refills | Status: DC

## 2020-05-05 NOTE — Patient Instructions (Signed)

## 2020-05-05 NOTE — Progress Notes (Signed)
Patient ID: Amy Dean, female    DOB: 1965-12-23, 54 y.o.   MRN: [id number]   No chief complaint on file.  Subjective:  CC: Left ring finger swelling  This is a new problem.  Reports today with complaint of left ring finger swelling, cracked skin, tight feeling, erythema, pus draining, itching.  Symptoms started 3 weeks ago.  Denies fever, chills, chest pain, shortness of breath, denies injury.  This is affecting her ability to do her job.  Symptoms started suddenly, has never had anything like this before.  left finger soreness. Started 3 weeks ago.    Medical History Amy Dean has a past medical history of Anemia, Enteritis, Essential hypertension, Headache, Hyperlipidemia, and Type 2 diabetes mellitus (HCC).   Outpatient Encounter Medications as of 05/05/2020  Medication Sig  . ALPRAZolam (XANAX) 0.5 MG tablet TAKE 1 TABLET (0.5 MG TOTAL) BY MOUTH AT BEDTIME AS NEEDED FOR SLEEP  . amLODipine (NORVASC) 10 MG tablet TAKE 1 TABLET (10 MG TOTAL) BY MOUTH DAILY.  Marland Kitchen aspirin 81 MG tablet Take 81 mg by mouth daily.  . cholecalciferol (VITAMIN D) 1000 units tablet Take 1,000 Units by mouth daily.  . citalopram (CELEXA) 20 MG tablet Take one tablet po daily  . doxycycline (VIBRA-TABS) 100 MG tablet Take 1 tablet (100 mg total) by mouth 2 (two) times daily.  Marland Kitchen estradiol (ESTRACE) 0.5 MG tablet Take 1 tablet (0.5 mg total) by mouth daily.  . Insulin Pen Needle (PEN NEEDLES 31GX5/16") 31G X 8 MM MISC Use daily as directed  . Insulin Pen Needle (UNIFINE PENTIPS) 32G X 4 MM MISC USE DAILY AS DIRECTED  . liraglutide (VICTOZA) 18 MG/3ML SOPN INJECT 1.2 MG UNDER THE SKIN DAILY  . lisinopril-hydrochlorothiazide (ZESTORETIC) 20-12.5 MG tablet TAKE 1 TABLET BY MOUTH DAILY FOR FOR BLOOD PRESSURE  . metFORMIN (GLUCOPHAGE) 500 MG tablet Take 2 po daily with breakfast  . omega-3 acid ethyl esters (LOVAZA) 1 g capsule TAKE 1 CAPSULE BY MOUTH 2 TIMES DAILY.  . pantoprazole (PROTONIX) 40 MG tablet  TAKE 1 TABLET BY MOUTH ONCE DAILY FOR ACID REFLUX  . progesterone (PROMETRIUM) 100 MG capsule Take 1 capsule (100 mg total) by mouth daily.  . rosuvastatin (CRESTOR) 40 MG tablet Take 1 tablet (40 mg total) by mouth daily.   No facility-administered encounter medications on file as of 05/05/2020.     Review of Systems  Constitutional: Negative for chills and fever.  HENT: Negative for ear pain.   Respiratory: Negative for shortness of breath.   Cardiovascular: Negative for chest pain.  Gastrointestinal: Negative for abdominal pain.  Skin: Positive for color change.       Left ring finger: red, swollen, cracked, pus, painful     Vitals BP (!) 144/90   Pulse 94   Temp (!) 97.2 F (36.2 C)   Ht 5\' 4"  (1.626 m)   Wt 200 lb (90.7 kg)   LMP 02/10/2011   SpO2 98%   BMI 34.33 kg/m   Objective:   Physical Exam Vitals reviewed.  Constitutional:      General: She is not in acute distress.    Appearance: Normal appearance.  Cardiovascular:     Rate and Rhythm: Normal rate and regular rhythm.     Heart sounds: Normal heart sounds.  Pulmonary:     Effort: Pulmonary effort is normal.     Breath sounds: Normal breath sounds.  Skin:    General: Skin is warm and dry.     Findings:  Erythema present.     Comments: Left ring finger: erythematous, swelling of soft tissre (not joint swelling), reports seeing pus draining (none today),  Neurological:     General: No focal deficit present.     Mental Status: She is alert.  Psychiatric:        Behavior: Behavior normal.      Assessment and Plan   1. Cellulitis of left ring finger - doxycycline (VIBRA-TABS) 100 MG tablet; Take 1 tablet (100 mg total) by mouth 2 (two) times daily.  Dispense: 20 tablet; Refill: 0   Will treat for left ring finger soft tissue  infection with doxycycline for 10 days.  She will treat the cracked skin and itching with emollient moisturizer.  Agrees with plan of care discussed today. Understands  warning signs to seek further care: Chest pain, shortness of breath, fever, chills, any significant change in health, any worsening symptoms of the left ring finger. Understands to follow-up in 3 weeks for evaluation, if symptoms have completely resolved, she can cancel this appointment.  Will send message via MyChart for Bernetha to recheck her blood pressure at home and notify if it remains greater than 140/90.   Dorena Bodo, FNP-C 05/05/2020

## 2020-05-26 ENCOUNTER — Ambulatory Visit: Payer: 59 | Admitting: Family Medicine

## 2020-05-31 ENCOUNTER — Other Ambulatory Visit: Payer: Self-pay | Admitting: Family Medicine

## 2020-05-31 MED FILL — ROSUVASTATIN CALCIUM 40 MG: 40 | 90 days supply | Qty: 90 | Fill #1

## 2020-05-31 MED FILL — ALPRAZolam 0.5 MG TABS: 0.5 | 30 days supply | Qty: 30 | Fill #3

## 2020-05-31 MED FILL — METFORMIN HCL 500 MG TABS: 500 | 90 days supply | Qty: 180 | Fill #1

## 2020-05-31 MED FILL — PROGESTERONE 100 MG CAPSULE: 100 | 30 days supply | Qty: 30 | Fill #2

## 2020-05-31 MED FILL — VICTOZA 2-PAK 18 MG/3 ML PE: 18 | 30 days supply | Qty: 6 | Fill #0

## 2020-05-31 MED FILL — ESTRADIOL 0.5 MG TABS: 0.5 | 30 days supply | Qty: 30 | Fill #2

## 2020-05-31 MED FILL — PANTOPRAZOLE SOD DR 40 MG T: 40 | 90 days supply | Qty: 90 | Fill #1

## 2020-06-07 MED FILL — AMLODIPINE BESYLATE 10 MG T: 10 | 90 days supply | Qty: 90 | Fill #0

## 2020-07-08 ENCOUNTER — Other Ambulatory Visit: Payer: Self-pay | Admitting: Family Medicine

## 2020-07-08 MED FILL — ESTRADIOL 0.5 MG TABS: 0.5 | 30 days supply | Qty: 30 | Fill #3

## 2020-07-08 MED FILL — ALPRAZolam 0.5 MG TABS: 0.5 | 30 days supply | Qty: 30 | Fill #0

## 2020-07-08 MED FILL — PROGESTERONE 100 MG CAPSULE: 100 | 30 days supply | Qty: 30 | Fill #3

## 2020-07-08 NOTE — Telephone Encounter (Signed)
Last med check up march 2021

## 2020-08-07 ENCOUNTER — Other Ambulatory Visit (HOSPITAL_COMMUNITY): Payer: Self-pay

## 2020-08-10 ENCOUNTER — Ambulatory Visit: Payer: 59 | Admitting: Family Medicine

## 2020-08-10 ENCOUNTER — Other Ambulatory Visit (HOSPITAL_COMMUNITY): Payer: Self-pay

## 2020-08-10 ENCOUNTER — Other Ambulatory Visit: Payer: Self-pay

## 2020-08-10 ENCOUNTER — Encounter: Payer: Self-pay | Admitting: Family Medicine

## 2020-08-10 VITALS — BP 146/86 | Temp 96.9°F | Wt 198.2 lb

## 2020-08-10 DIAGNOSIS — E1169 Type 2 diabetes mellitus with other specified complication: Secondary | ICD-10-CM | POA: Diagnosis not present

## 2020-08-10 DIAGNOSIS — E785 Hyperlipidemia, unspecified: Secondary | ICD-10-CM

## 2020-08-10 DIAGNOSIS — L301 Dyshidrosis [pompholyx]: Secondary | ICD-10-CM | POA: Insufficient documentation

## 2020-08-10 DIAGNOSIS — I10 Essential (primary) hypertension: Secondary | ICD-10-CM

## 2020-08-10 DIAGNOSIS — E119 Type 2 diabetes mellitus without complications: Secondary | ICD-10-CM | POA: Diagnosis not present

## 2020-08-10 MED ORDER — AMLODIPINE BESYLATE 10 MG PO TABS
10.0000 mg | ORAL_TABLET | Freq: Every day | ORAL | 1 refills | Status: DC
  Filled 2020-08-10 – 2020-09-30 (×2): qty 90, 90d supply, fill #0
  Filled 2021-03-18: qty 90, 90d supply, fill #1

## 2020-08-10 MED ORDER — PANTOPRAZOLE SODIUM 40 MG PO TBEC
40.0000 mg | DELAYED_RELEASE_TABLET | Freq: Every day | ORAL | 1 refills | Status: DC
  Filled 2020-08-10: qty 90, fill #0
  Filled 2020-09-30: qty 90, 90d supply, fill #0

## 2020-08-10 MED ORDER — LIRAGLUTIDE 18 MG/3ML ~~LOC~~ SOPN
1.2000 mg | PEN_INJECTOR | Freq: Every day | SUBCUTANEOUS | 5 refills | Status: DC
  Filled 2020-08-10: qty 6, 30d supply, fill #0
  Filled 2020-09-30: qty 6, 30d supply, fill #1
  Filled 2020-12-01: qty 6, 30d supply, fill #2
  Filled 2021-01-21: qty 6, 30d supply, fill #3
  Filled 2021-04-13: qty 6, 30d supply, fill #4
  Filled 2021-06-01: qty 6, 30d supply, fill #5

## 2020-08-10 MED ORDER — METFORMIN HCL 500 MG PO TABS
1000.0000 mg | ORAL_TABLET | Freq: Every day | ORAL | 1 refills | Status: DC
  Filled 2020-08-10: qty 180, 90d supply, fill #0

## 2020-08-10 MED ORDER — ALPRAZOLAM 0.5 MG PO TABS
0.5000 mg | ORAL_TABLET | Freq: Every evening | ORAL | 3 refills | Status: DC
  Filled 2020-08-10: qty 30, 30d supply, fill #0
  Filled 2020-09-30: qty 30, 30d supply, fill #1
  Filled 2020-11-01: qty 30, 30d supply, fill #2

## 2020-08-10 MED ORDER — OMEGA-3-ACID ETHYL ESTERS 1 G PO CAPS
ORAL_CAPSULE | ORAL | 1 refills | Status: DC
  Filled 2020-08-10 – 2020-09-30 (×3): qty 180, 90d supply, fill #0

## 2020-08-10 MED ORDER — LISINOPRIL-HYDROCHLOROTHIAZIDE 20-12.5 MG PO TABS
1.0000 | ORAL_TABLET | Freq: Every day | ORAL | 1 refills | Status: DC
  Filled 2020-08-10: qty 90, 90d supply, fill #0
  Filled 2021-03-07: qty 90, 90d supply, fill #1

## 2020-08-10 MED ORDER — ROSUVASTATIN CALCIUM 40 MG PO TABS
40.0000 mg | ORAL_TABLET | Freq: Every day | ORAL | 1 refills | Status: DC
  Filled 2020-08-10 – 2020-09-30 (×2): qty 90, 90d supply, fill #0

## 2020-08-10 MED ORDER — TRIAMCINOLONE ACETONIDE 0.1 % EX CREA
TOPICAL_CREAM | CUTANEOUS | 1 refills | Status: DC
  Filled 2020-08-10: qty 45, 15d supply, fill #0
  Filled 2020-09-30: qty 45, 15d supply, fill #1

## 2020-08-10 NOTE — Progress Notes (Signed)
Subjective:    Patient ID: Amy Dean, female    DOB: [birth year], 55 y.o.   MRN: [id number]  Diabetes She presents for her follow-up diabetic visit. She has type 2 diabetes mellitus. There are no hypoglycemic associated symptoms. Pertinent negatives for hypoglycemia include no confusion or dizziness. Pertinent negatives for diabetes include no chest pain, no fatigue, no polydipsia, no polyphagia and no weakness. There are no hypoglycemic complications. There are no diabetic complications. Risk factors for coronary artery disease include hypertension. She is compliant with treatment most of the time. She does not see a podiatrist.Eye exam is current.  Hypertension This is a chronic problem. Pertinent negatives include no chest pain or shortness of breath. Risk factors for coronary artery disease include diabetes mellitus. There are no compliance problems.    Pt cellulitis on left ring finger has recently came back. Pt states her finger will crack open and is painful. Seen [name] in December for cellulitis  Essential hypertension - Plan: Hemoglobin A1c, Lipid Profile, Comprehensive Metabolic Panel (CMET)  Hyperlipidemia associated with type 2 diabetes mellitus ([place]) - Plan: Hemoglobin A1c, Lipid Profile, Comprehensive Metabolic Panel (CMET)  Controlled type 2 diabetes mellitus without complication, without long-term current use of insulin (HCC) - Plan: Hemoglobin A1c, Lipid Profile, Comprehensive Metabolic Panel (CMET)  Dyshidrotic eczema    Review of Systems  Constitutional: Negative for activity change, appetite change and fatigue.  HENT: Negative for congestion and rhinorrhea.   Respiratory: Negative for cough and shortness of breath.   Cardiovascular: Negative for chest pain and leg swelling.  Gastrointestinal: Negative for abdominal pain and diarrhea.  Endocrine: Negative for polydipsia and polyphagia.  Skin: Negative for color change.  Neurological: Negative for dizziness  and weakness.  Psychiatric/Behavioral: Negative for behavioral problems and confusion.       Objective:   Physical Exam Vitals reviewed.  Constitutional:      General: She is not in acute distress. HENT:     Head: Normocephalic.  Cardiovascular:     Rate and Rhythm: Normal rate and regular rhythm.     Heart sounds: Normal heart sounds. No murmur heard.   Pulmonary:     Effort: Pulmonary effort is normal.     Breath sounds: Normal breath sounds.  Lymphadenopathy:     Cervical: No cervical adenopathy.  Neurological:     Mental Status: She is alert.  Psychiatric:        Behavior: Behavior normal.   What appears to be dyshidrotic eczema is noted on the finger no sign of cellulitis    Blood pressure checked multiple times Best reading 148/86 Have encouragedTo do more walking and watch her diet closer    Assessment & Plan:  1. Essential hypertension Blood pressure decent control when checked outside of the office continue current measures Patient will send [place] readings on her blood pressure in the next couple weeks may need to adjust medicines - Hemoglobin A1c - Lipid Profile - Comprehensive Metabolic Panel (CMET)  2. Hyperlipidemia associated with type 2 diabetes mellitus ([place]) Check a cholesterol continue medication watch diet - Hemoglobin A1c - Lipid Profile - Comprehensive Metabolic Panel (CMET)  3. Controlled type 2 diabetes mellitus without complication, without long-term current use of insulin (HCC) Check A1c continue current medication watch diet - Hemoglobin A1c - Lipid Profile - Comprehensive Metabolic Panel (CMET)  4. Dyshidrotic eczema Steroid cream apply twice daily as needed follow-up if progressive troubles or worse  Recheck within 4 to 6 months send [place] blood pressure  readings within 2 weeks

## 2020-08-10 NOTE — Patient Instructions (Addendum)
Diabetes Mellitus and Nutrition, Adult When you have diabetes, or diabetes mellitus, it is very important to have healthy eating habits because your blood sugar (glucose) levels are greatly affected by what you eat and drink. Eating healthy foods in the right amounts, at about the same times every day, can help you:  Control your blood glucose.  Lower your risk of heart disease.  Improve your blood pressure.  Reach or maintain a healthy weight. What can affect my meal plan? Every person with diabetes is different, and each person has different needs for a meal plan. Your health care provider may recommend that you work with a dietitian to make a meal plan that is best for you. Your meal plan may vary depending on factors such as:  The calories you need.  The medicines you take.  Your weight.  Your blood glucose, blood pressure, and cholesterol levels.  Your activity level.  Other health conditions you have, such as heart or kidney disease. How do carbohydrates affect me? Carbohydrates, also called carbs, affect your blood glucose level more than any other type of food. Eating carbs naturally raises the amount of glucose in your blood. Carb counting is a method for keeping track of how many carbs you eat. Counting carbs is important to keep your blood glucose at a healthy level, especially if you use insulin or take certain oral diabetes medicines. It is important to know how many carbs you can safely have in each meal. This is different for every person. Your dietitian can help you calculate how many carbs you should have at each meal and for each snack. How does alcohol affect me? Alcohol can cause a sudden decrease in blood glucose (hypoglycemia), especially if you use insulin or take certain oral diabetes medicines. Hypoglycemia can be a life-threatening condition. Symptoms of hypoglycemia, such as sleepiness, dizziness, and confusion, are similar to symptoms of having too much  alcohol.  Do not drink alcohol if: ? Your health care provider tells you not to drink. ? You are pregnant, may be pregnant, or are planning to become pregnant.  If you drink alcohol: ? Do not drink on an empty stomach. ? Limit how much you use to:  0-1 drink a day for women.  0-2 drinks a day for men. ? Be aware of how much alcohol is in your drink. In the [place] one drink equals one 12 oz bottle of beer (355 mL), one 5 oz glass of wine (148 mL), or one 1 oz glass of hard liquor (44 mL). ? Keep yourself hydrated with water, diet soda, or unsweetened iced tea.  Keep in mind that regular soda, juice, and other mixers may contain a lot of sugar and must be counted as carbs. What are tips for following this plan? Reading food labels  Start by checking the serving size on the "Nutrition Facts" label of packaged foods and drinks. The amount of calories, carbs, fats, and other nutrients listed on the label is based on one serving of the item. Many items contain more than one serving per package.  Check the total grams (g) of carbs in one serving. You can calculate the number of servings of carbs in one serving by dividing the total carbs by 15. For example, if a food has 30 g of total carbs per serving, it would be equal to 2 servings of carbs.  Check the number of grams (g) of saturated fats and trans fats in one serving. Choose foods that have   a low amount or none of these fats.  Check the number of milligrams (mg) of salt (sodium) in one serving. Most people should limit total sodium intake to less than 2,300 mg per day.  Always check the nutrition information of foods labeled as "low-fat" or "nonfat." These foods may be higher in added sugar or refined carbs and should be avoided.  Talk to your dietitian to identify your daily goals for nutrients listed on the label. Shopping  Avoid buying canned, pre-made, or processed foods. These foods tend to be high in fat, sodium, and added  sugar.  Shop around the outside edge of the grocery store. This is where you will most often find fresh fruits and vegetables, bulk grains, fresh meats, and fresh dairy. Cooking  Use low-heat cooking methods, such as baking, instead of high-heat cooking methods like deep frying.  Cook using healthy oils, such as olive, canola, or sunflower oil.  Avoid cooking with butter, cream, or high-fat meats. Meal planning  Eat meals and snacks regularly, preferably at the same times every day. Avoid going long periods of time without eating.  Eat foods that are high in fiber, such as fresh fruits, vegetables, beans, and whole grains. Talk with your dietitian about how many servings of carbs you can eat at each meal.  Eat 4-6 oz (112-168 g) of lean protein each day, such as lean meat, chicken, fish, eggs, or tofu. One ounce (oz) of lean protein is equal to: ? 1 oz (28 g) of meat, chicken, or fish. ? 1 egg. ?  cup (62 g) of tofu.  Eat some foods each day that contain healthy fats, such as avocado, nuts, seeds, and fish.   What foods should I eat? Fruits Berries. Apples. Oranges. Peaches. Apricots. Plums. Grapes. Mango. Papaya. Pomegranate. Kiwi. Cherries. Vegetables Lettuce. Spinach. Leafy greens, including kale, chard, collard greens, and mustard greens. Beets. Cauliflower. Cabbage. Broccoli. Carrots. Green beans. Tomatoes. Peppers. Onions. Cucumbers. Brussels sprouts. Grains Whole grains, such as whole-wheat or whole-grain bread, crackers, tortillas, cereal, and pasta. Unsweetened oatmeal. Quinoa. Brown or wild rice. Meats and other proteins Seafood. Poultry without skin. Lean cuts of poultry and beef. Tofu. Nuts. Seeds. Dairy Low-fat or fat-free dairy products such as milk, yogurt, and cheese. The items listed above may not be a complete list of foods and beverages you can eat. Contact a dietitian for more information. What foods should I avoid? Fruits Fruits canned with  syrup. Vegetables Canned vegetables. Frozen vegetables with butter or cream sauce. Grains Refined white flour and flour products such as bread, pasta, snack foods, and cereals. Avoid all processed foods. Meats and other proteins Fatty cuts of meat. Poultry with skin. Breaded or fried meats. Processed meat. Avoid saturated fats. Dairy Full-fat yogurt, cheese, or milk. Beverages Sweetened drinks, such as soda or iced tea. The items listed above may not be a complete list of foods and beverages you should avoid. Contact a dietitian for more information. Questions to ask a health care provider  Do I need to meet with a diabetes educator?  Do I need to meet with a dietitian?  What number can I call if I have questions?  When are the best times to check my blood glucose? Where to find more information:  American Diabetes Association: diabetes.org  Academy of Nutrition and Dietetics: www.eatright.org  National Institute of Diabetes and Digestive and Kidney Diseases: www.niddk.nih.gov  Association of Diabetes Care and Education Specialists: www.diabeteseducator.org Summary  It is important to have healthy eating   habits because your blood sugar (glucose) levels are greatly affected by what you eat and drink.  A healthy meal plan will help you control your blood glucose and maintain a healthy lifestyle.  Your health care provider may recommend that you work with a dietitian to make a meal plan that is best for you.  Keep in mind that carbohydrates (carbs) and alcohol have immediate effects on your blood glucose levels. It is important to count carbs and to use alcohol carefully. This information is not intended to replace advice given to you by your health care provider. Make sure you discuss any questions you have with your health care provider. Document Revised: 04/01/2019 Document Reviewed: 04/01/2019 Elsevier Patient Education  2021 [place].  [email or web address]">  DASH Eating Plan DASH stands for Dietary Approaches to Stop Hypertension. The DASH eating plan is a healthy eating plan that has been shown to:  Reduce high blood pressure (hypertension).  Reduce your risk for type 2 diabetes, heart disease, and stroke.  Help with weight loss. What are tips for following this plan? Reading food labels  Check food labels for the amount of salt (sodium) per serving. Choose foods with less than 5 percent of the Daily Value of sodium. Generally, foods with less than 300 milligrams (mg) of sodium per serving fit into this eating plan.  To find whole grains, look for the word "whole" as the first word in the ingredient list. Shopping  Buy products labeled as "low-sodium" or "no salt added."  Buy fresh foods. Avoid canned foods and pre-made or frozen meals. Cooking  Avoid adding salt when cooking. Use salt-free seasonings or herbs instead of table salt or sea salt. Check with your health care provider or pharmacist before using salt substitutes.  Do not fry foods. Cook foods using healthy methods such as baking, boiling, grilling, roasting, and broiling instead.  Cook with heart-healthy oils, such as olive, canola, avocado, soybean, or sunflower oil. Meal planning  Eat a balanced diet that includes: ? 4 or more servings of fruits and 4 or more servings of vegetables each day. Try to fill one-half of your plate with fruits and vegetables. ? 6-8 servings of whole grains each day. ? Less than 6 oz (170 g) of lean meat, poultry, or fish each day. A 3-oz (85-g) serving of meat is about the same size as a deck of cards. One egg equals 1 oz (28 g). ? 2-3 servings of low-fat dairy each day. One serving is 1 cup (237 mL). ? 1 serving of nuts, seeds, or beans 5 times each week. ? 2-3 servings of heart-healthy fats. Healthy fats called omega-3 fatty acids are found in foods such as walnuts,  flaxseeds, fortified milks, and eggs. These fats are also found in cold-water fish, such as sardines, salmon, and mackerel.  Limit how much you eat of: ? Canned or prepackaged foods. ? Food that is high in trans fat, such as some fried foods. ? Food that is high in saturated fat, such as fatty meat. ? Desserts and other sweets, sugary drinks, and other foods with added sugar. ? Full-fat dairy products.  Do not salt foods before eating.  Do not eat more than 4 egg yolks a week.  Try to eat at least 2 vegetarian meals a week.  Eat more home-cooked food and less restaurant, buffet, and fast food.   Lifestyle  When eating at a restaurant, ask that your food be prepared with less salt or no salt, if  possible.  If you drink alcohol: ? Limit how much you use to:  0-1 drink a day for women who are not pregnant.  0-2 drinks a day for men. ? Be aware of how much alcohol is in your drink. In the [place] one drink equals one 12 oz bottle of beer (355 mL), one 5 oz glass of wine (148 mL), or one 1 oz glass of hard liquor (44 mL). General information  Avoid eating more than 2,300 mg of salt a day. If you have hypertension, you may need to reduce your sodium intake to 1,500 mg a day.  Work with your health care provider to maintain a healthy body weight or to lose weight. Ask what an ideal weight is for you.  Get at least 30 minutes of exercise that causes your heart to beat faster (aerobic exercise) most days of the week. Activities may include walking, swimming, or biking.  Work with your health care provider or dietitian to adjust your eating plan to your individual calorie needs. What foods should I eat? Fruits All fresh, dried, or frozen fruit. Canned fruit in natural juice (without added sugar). Vegetables Fresh or frozen vegetables (raw, steamed, roasted, or grilled). Low-sodium or reduced-sodium tomato and vegetable juice. Low-sodium or reduced-sodium tomato sauce and tomato paste.  Low-sodium or reduced-sodium canned vegetables. Grains Whole-grain or whole-wheat bread. Whole-grain or whole-wheat pasta. Brown rice. [place]. Bulgur. Whole-grain and low-sodium cereals. Pita bread. Low-fat, low-sodium crackers. Whole-wheat flour tortillas. Meats and other proteins Skinless chicken or [place]. Ground chicken or [place]. Pork with fat trimmed off. Fish and seafood. Egg whites. Dried beans, peas, or lentils. Unsalted nuts, nut butters, and seeds. Unsalted canned beans. Lean cuts of beef with fat trimmed off. Low-sodium, lean precooked or cured meat, such as sausages or meat loaves. Dairy Low-fat (1%) or fat-free (skim) milk. Reduced-fat, low-fat, or fat-free cheeses. Nonfat, low-sodium ricotta or cottage cheese. Low-fat or nonfat yogurt. Low-fat, low-sodium cheese. Fats and oils Soft margarine without trans fats. Vegetable oil. Reduced-fat, low-fat, or light mayonnaise and salad dressings (reduced-sodium). Canola, safflower, olive, avocado, soybean, and sunflower oils. Avocado. Seasonings and condiments Herbs. Spices. Seasoning mixes without salt. Other foods Unsalted popcorn and pretzels. Fat-free sweets. The items listed above may not be a complete list of foods and beverages you can eat. Contact a dietitian for more information. What foods should I avoid? Fruits Canned fruit in a light or heavy syrup. Fried fruit. Fruit in cream or butter sauce. Vegetables Creamed or fried vegetables. Vegetables in a cheese sauce. Regular canned vegetables (not low-sodium or reduced-sodium). Regular canned tomato sauce and paste (not low-sodium or reduced-sodium). Regular tomato and vegetable juice (not low-sodium or reduced-sodium). Angie Fava. Olives. Grains Baked goods made with fat, such as croissants, muffins, or some breads. Dry pasta or rice meal packs. Meats and other proteins Fatty cuts of meat. Ribs. Fried meat. Berniece Salines. Bologna, salami, and other precooked or cured meats, such as  sausages or meat loaves. Fat from the back of a pig (fatback). Bratwurst. Salted nuts and seeds. Canned beans with added salt. Canned or smoked fish. Whole eggs or egg yolks. Chicken or [place] with skin. Dairy Whole or 2% milk, cream, and half-and-half. Whole or full-fat cream cheese. Whole-fat or sweetened yogurt. Full-fat cheese. Nondairy creamers. Whipped toppings. Processed cheese and cheese spreads. Fats and oils Butter. Stick margarine. Lard. Shortening. Ghee. Bacon fat. Tropical oils, such as coconut, palm kernel, or palm oil. Seasonings and condiments Onion salt, garlic salt, seasoned salt, table  salt, and sea salt. Worcestershire sauce. Tartar sauce. Barbecue sauce. Teriyaki sauce. Soy sauce, including reduced-sodium. Steak sauce. Canned and packaged gravies. Fish sauce. Oyster sauce. Cocktail sauce. Store-bought horseradish. Ketchup. Mustard. Meat flavorings and tenderizers. Bouillon cubes. Hot sauces. Pre-made or packaged marinades. Pre-made or packaged taco seasonings. Relishes. Regular salad dressings. Other foods Salted popcorn and pretzels. The items listed above may not be a complete list of foods and beverages you should avoid. Contact a dietitian for more information. Where to find more information  National Heart, Lung, and Blood Institute: https://wilson-eaton.com/  American Heart Association: www.heart.org  Academy of Nutrition and Dietetics: www.eatright.[place]: www.kidney.org Summary  The DASH eating plan is a healthy eating plan that has been shown to reduce high blood pressure (hypertension). It may also reduce your risk for type 2 diabetes, heart disease, and stroke.  When on the DASH eating plan, aim to eat more fresh fruits and vegetables, whole grains, lean proteins, low-fat dairy, and heart-healthy fats.  With the DASH eating plan, you should limit salt (sodium) intake to 2,300 mg a day. If you have hypertension, you may need to reduce your  sodium intake to 1,500 mg a day.  Work with your health care provider or dietitian to adjust your eating plan to your individual calorie needs. This information is not intended to replace advice given to you by your health care provider. Make sure you discuss any questions you have with your health care provider. Document Revised: 03/28/2019 Document Reviewed: 03/28/2019 Elsevier Patient Education  2021 [employer].

## 2020-08-11 ENCOUNTER — Other Ambulatory Visit (HOSPITAL_COMMUNITY): Payer: Self-pay

## 2020-08-12 ENCOUNTER — Other Ambulatory Visit (HOSPITAL_COMMUNITY): Payer: Self-pay

## 2020-08-12 ENCOUNTER — Other Ambulatory Visit (INDEPENDENT_AMBULATORY_CARE_PROVIDER_SITE_OTHER): Payer: Self-pay | Admitting: Internal Medicine

## 2020-08-13 ENCOUNTER — Other Ambulatory Visit (HOSPITAL_COMMUNITY): Payer: Self-pay

## 2020-08-17 ENCOUNTER — Other Ambulatory Visit (HOSPITAL_COMMUNITY): Payer: Self-pay

## 2020-08-18 DIAGNOSIS — E119 Type 2 diabetes mellitus without complications: Secondary | ICD-10-CM | POA: Diagnosis not present

## 2020-08-18 DIAGNOSIS — E785 Hyperlipidemia, unspecified: Secondary | ICD-10-CM | POA: Diagnosis not present

## 2020-08-18 DIAGNOSIS — I10 Essential (primary) hypertension: Secondary | ICD-10-CM | POA: Diagnosis not present

## 2020-08-18 DIAGNOSIS — E1169 Type 2 diabetes mellitus with other specified complication: Secondary | ICD-10-CM | POA: Diagnosis not present

## 2020-08-19 ENCOUNTER — Other Ambulatory Visit (HOSPITAL_COMMUNITY): Payer: Self-pay

## 2020-08-19 ENCOUNTER — Other Ambulatory Visit: Payer: Self-pay | Admitting: *Deleted

## 2020-08-19 DIAGNOSIS — H52223 Regular astigmatism, bilateral: Secondary | ICD-10-CM | POA: Diagnosis not present

## 2020-08-19 LAB — HM DIABETES EYE EXAM

## 2020-08-19 LAB — COMPREHENSIVE METABOLIC PANEL
ALT: 20 IU/L (ref 0–32)
AST: 24 IU/L (ref 0–40)
Albumin/Globulin Ratio: 1.9 (ref 1.2–2.2)
Albumin: 4.9 g/dL (ref 3.8–4.9)
Alkaline Phosphatase: 68 IU/L (ref 44–121)
BUN/Creatinine Ratio: 22 (ref 9–23)
BUN: 14 mg/dL (ref 6–24)
Bilirubin Total: 0.5 mg/dL (ref 0.0–1.2)
CO2: 22 mmol/L (ref 20–29)
Calcium: 9.6 mg/dL (ref 8.7–10.2)
Chloride: 103 mmol/L (ref 96–106)
Creatinine, Ser: 0.65 mg/dL (ref 0.57–1.00)
Globulin, Total: 2.6 g/dL (ref 1.5–4.5)
Glucose: 106 mg/dL — ABNORMAL HIGH (ref 65–99)
Potassium: 4.4 mmol/L (ref 3.5–5.2)
Sodium: 144 mmol/L (ref 134–144)
Total Protein: 7.5 g/dL (ref 6.0–8.5)
eGFR: 103 mL/min/{1.73_m2} (ref >59)

## 2020-08-19 LAB — LIPID PANEL
Chol/HDL Ratio: 2.7 ratio (ref 0.0–4.4)
Cholesterol, Total: 145 mg/dL (ref 100–199)
HDL: 53 mg/dL (ref >39)
LDL Chol Calc (NIH): 69 mg/dL (ref 0–99)
Triglycerides: 130 mg/dL (ref 0–149)
VLDL Cholesterol Cal: 23 mg/dL (ref 5–40)

## 2020-08-19 LAB — HEMOGLOBIN A1C
Est. average glucose Bld gHb Est-mCnc: 123 mg/dL
Hgb A1c MFr Bld: 5.9 % — ABNORMAL HIGH (ref 4.8–5.6)

## 2020-08-19 MED ORDER — METFORMIN HCL 500 MG PO TABS
750.0000 mg | ORAL_TABLET | Freq: Every morning | ORAL | 1 refills | Status: DC
  Filled 2020-08-19: qty 135, 90d supply, fill #0
  Filled 2021-04-13: qty 135, 90d supply, fill #1

## 2020-08-23 ENCOUNTER — Other Ambulatory Visit (HOSPITAL_COMMUNITY): Payer: Self-pay

## 2020-08-23 ENCOUNTER — Encounter: Payer: Self-pay | Admitting: Family Medicine

## 2020-08-23 MED ORDER — HYDRALAZINE HCL 25 MG PO TABS
25.0000 mg | ORAL_TABLET | Freq: Two times a day (BID) | ORAL | 2 refills | Status: DC
  Filled 2020-08-23: qty 60, 30d supply, fill #0
  Filled 2020-09-30: qty 60, 30d supply, fill #1

## 2020-08-23 NOTE — Telephone Encounter (Signed)
Nurses  The blood pressure that the patient is it in-shows significant elevation.  I would recommend adding hydralazine 25 mg 1 taken twice daily to her regimen.  #60 with 2 refills Continue the lisinopril/HCTZ Continue amlodipine Continue healthy eating and fitting in some walking on a regular basis Send [place] some readings within the next 2 to 3 weeks Follow-up office visit within 3 months We will do a follow-up office visit sooner if blood pressure continues to be out of recommended parameters

## 2020-08-23 NOTE — Addendum Note (Signed)
Addended by: Vicente Males on: 08/23/2020 02:15 PM   Modules accepted: Orders

## 2020-08-24 ENCOUNTER — Other Ambulatory Visit (HOSPITAL_COMMUNITY): Payer: Self-pay

## 2020-08-26 ENCOUNTER — Encounter: Payer: Self-pay | Admitting: Family Medicine

## 2020-08-27 NOTE — Telephone Encounter (Signed)
Nurses Please increase hydralazine New dose 25 mg 1 taken 3 times daily, #90, 4 refills Inform Kimmy Have her follow-up in approximately 2 months sooner problems Send [place] some readings within the next 3 to 4 weeks thanks

## 2020-08-31 ENCOUNTER — Encounter: Payer: Self-pay | Admitting: Family Medicine

## 2020-09-06 ENCOUNTER — Other Ambulatory Visit (HOSPITAL_BASED_OUTPATIENT_CLINIC_OR_DEPARTMENT_OTHER): Payer: Self-pay

## 2020-09-30 ENCOUNTER — Other Ambulatory Visit (HOSPITAL_COMMUNITY): Payer: Self-pay

## 2020-09-30 ENCOUNTER — Telehealth: Payer: Self-pay | Admitting: Family Medicine

## 2020-09-30 ENCOUNTER — Other Ambulatory Visit (INDEPENDENT_AMBULATORY_CARE_PROVIDER_SITE_OTHER): Payer: Self-pay | Admitting: Family Medicine

## 2020-09-30 NOTE — Telephone Encounter (Signed)
Omega-3-lovaza not covered and they are recommending fenofibrate which would not be indicated for her given her numbers as well as statin use We will send medication to the patient sent

## 2020-10-01 ENCOUNTER — Other Ambulatory Visit (HOSPITAL_COMMUNITY): Payer: Self-pay

## 2020-10-01 ENCOUNTER — Telehealth: Payer: Self-pay

## 2020-10-01 NOTE — Telephone Encounter (Signed)
Call from patient requesting progesterone to be sent in by Dr Nicki Reaper, she states is not seeing dr Anastasio Champion, endocrinelogy anymore. Lovaza is no longer being covered by insurance and is there an alternative medication that can be sent in . Please advise

## 2020-10-05 ENCOUNTER — Other Ambulatory Visit: Payer: Self-pay | Admitting: Family Medicine

## 2020-10-05 ENCOUNTER — Encounter: Payer: Self-pay | Admitting: Family Medicine

## 2020-10-05 ENCOUNTER — Other Ambulatory Visit (HOSPITAL_COMMUNITY): Payer: Self-pay

## 2020-10-05 MED ORDER — PROGESTERONE MICRONIZED 100 MG PO CAPS
100.0000 mg | ORAL_CAPSULE | Freq: Every day | ORAL | 1 refills | Status: DC
  Filled 2020-10-05: qty 90, 90d supply, fill #0

## 2020-10-05 NOTE — Telephone Encounter (Signed)
May utilize OTC fish oil omega-3 if she would like to do so.  Alternatively she could be healthy with her eating continue the statin and repeat the cholesterol profile in several months.  Under current guidelines as long as triglycerides are not in the severely elevated range they do not require separate medications.  Whichever path would be fine

## 2020-10-05 NOTE — Addendum Note (Signed)
Addended by: Dairl Ponder on: 10/05/2020 02:23 PM   Modules accepted: Orders

## 2020-10-05 NOTE — Telephone Encounter (Signed)
I reviewed over the patient's cholesterol profiles.  I would not recommend omega-3 or prescription Lovaza or fenofibrate.  We will stick with Crestor we will repeat lipid profile in the fall MyChart message was sent to the patient

## 2020-10-05 NOTE — Telephone Encounter (Signed)
Prescription sent electronically to pharmacy. Patient notified and also advised per Dr Nicki Reaper: May utilize OTC fish oil omega-3 if she would like to do so.  Alternatively she could be healthy with her eating continue the statin and repeat the cholesterol profile in several months.  Under current guidelines as long as triglycerides are not in the severely elevated range they do not require separate medications.  Whichever path would be fine  Patient verbalized understanding.

## 2020-10-05 NOTE — Telephone Encounter (Signed)
May have 90-day with 1 refill

## 2020-10-07 ENCOUNTER — Other Ambulatory Visit (HOSPITAL_COMMUNITY): Payer: Self-pay

## 2020-10-21 ENCOUNTER — Encounter: Payer: Self-pay | Admitting: Family Medicine

## 2020-11-01 ENCOUNTER — Other Ambulatory Visit (HOSPITAL_COMMUNITY): Payer: Self-pay

## 2020-11-14 ENCOUNTER — Other Ambulatory Visit (INDEPENDENT_AMBULATORY_CARE_PROVIDER_SITE_OTHER): Payer: Self-pay | Admitting: Internal Medicine

## 2020-11-15 ENCOUNTER — Other Ambulatory Visit (HOSPITAL_COMMUNITY): Payer: Self-pay

## 2020-12-01 ENCOUNTER — Other Ambulatory Visit (HOSPITAL_COMMUNITY): Payer: Self-pay

## 2020-12-03 ENCOUNTER — Other Ambulatory Visit (HOSPITAL_COMMUNITY): Payer: Self-pay

## 2020-12-03 ENCOUNTER — Ambulatory Visit (INDEPENDENT_AMBULATORY_CARE_PROVIDER_SITE_OTHER): Payer: 59 | Admitting: Family Medicine

## 2020-12-03 ENCOUNTER — Other Ambulatory Visit: Payer: Self-pay

## 2020-12-03 VITALS — BP 138/78 | Temp 95.4°F | Ht 62.5 in | Wt 200.0 lb

## 2020-12-03 DIAGNOSIS — Z0000 Encounter for general adult medical examination without abnormal findings: Secondary | ICD-10-CM

## 2020-12-03 DIAGNOSIS — Z0001 Encounter for general adult medical examination with abnormal findings: Secondary | ICD-10-CM | POA: Diagnosis not present

## 2020-12-03 DIAGNOSIS — E1169 Type 2 diabetes mellitus with other specified complication: Secondary | ICD-10-CM | POA: Diagnosis not present

## 2020-12-03 DIAGNOSIS — E785 Hyperlipidemia, unspecified: Secondary | ICD-10-CM | POA: Diagnosis not present

## 2020-12-03 DIAGNOSIS — I10 Essential (primary) hypertension: Secondary | ICD-10-CM | POA: Diagnosis not present

## 2020-12-03 MED ORDER — ALPRAZOLAM 0.5 MG PO TABS
0.5000 mg | ORAL_TABLET | Freq: Every evening | ORAL | 5 refills | Status: DC
  Filled 2020-12-03: qty 30, 30d supply, fill #0
  Filled 2021-01-10: qty 30, 30d supply, fill #1
  Filled 2021-03-07: qty 30, 30d supply, fill #2
  Filled 2021-04-13: qty 30, 30d supply, fill #3
  Filled 2021-05-09 – 2021-05-11 (×2): qty 30, 30d supply, fill #4

## 2020-12-03 MED ORDER — ROSUVASTATIN CALCIUM 40 MG PO TABS
40.0000 mg | ORAL_TABLET | Freq: Every day | ORAL | 1 refills | Status: DC
  Filled 2020-12-03 – 2021-03-18 (×2): qty 90, 90d supply, fill #0
  Filled 2021-06-24: qty 90, 90d supply, fill #1

## 2020-12-03 MED ORDER — PROGESTERONE MICRONIZED 100 MG PO CAPS
100.0000 mg | ORAL_CAPSULE | Freq: Every day | ORAL | 1 refills | Status: DC
  Filled 2020-12-03: qty 90, 90d supply, fill #0

## 2020-12-03 MED ORDER — ESTRADIOL 0.5 MG PO TABS
0.5000 mg | ORAL_TABLET | Freq: Every day | ORAL | 1 refills | Status: DC
  Filled 2020-12-03: qty 37, 37d supply, fill #0
  Filled 2020-12-03 (×2): qty 53, 53d supply, fill #0
  Filled 2020-12-03: qty 83, 83d supply, fill #0
  Filled 2020-12-03: qty 7, 7d supply, fill #0
  Filled 2021-01-10 – 2021-04-13 (×2): qty 90, 90d supply, fill #1

## 2020-12-03 NOTE — Progress Notes (Signed)
0
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
## 2020-12-06 ENCOUNTER — Other Ambulatory Visit (HOSPITAL_COMMUNITY): Payer: Self-pay

## 2020-12-27 ENCOUNTER — Telehealth: Payer: Self-pay | Admitting: Family Medicine

## 2020-12-27 MED ORDER — CHLORZOXAZONE 500 MG PO TABS: ORAL_TABLET | ORAL | 0 refills | Status: DC

## 2020-12-27 MED ORDER — ETODOLAC 400 MG PO TABS: ORAL_TABLET | ORAL | 0 refills | Status: DC

## 2020-12-27 NOTE — Telephone Encounter (Signed)
Patient is requesting something for back spasms that started on 8/20 to be called into [facility]

## 2020-12-27 NOTE — Telephone Encounter (Signed)
500mg

## 2020-12-27 NOTE — Telephone Encounter (Signed)
Medication sent to pharmacy and pt is aware. Pt verbalized understanding

## 2020-12-27 NOTE — Telephone Encounter (Signed)
Pt began to have sharp pain in center of back on 12/25/20. States the pain makes her legs hurt and she can barely stand when the spasm happens. Pt has tried Aleve Back but that has not helped. Pt states we may send it to [facility] pharmacy or [facility]. Please advise. Thank you

## 2020-12-27 NOTE — Telephone Encounter (Signed)
Nurses-please talk with patient Gentle stretches along with warm compresses is a good starting point Anti-inflammatory can be taken twice daily over the next 5 to 7 days to help with discomfort Low-dose anti-inflammatory for 5 to 7 days Lodine 400 mg 1 twice daily as needed, #20  As for muscle relaxers I would only recommend using that when at home never with driving or with work Not for long-term use because they can cause drowsiness Recommend Parafon forte, 1 taken every 8 hours as needed home use only, #20  If ongoing troubles follow-up office visit

## 2020-12-27 NOTE — Telephone Encounter (Signed)
Please advise dose of Parafon Forte. Thank you

## 2021-01-03 ENCOUNTER — Other Ambulatory Visit: Payer: Self-pay

## 2021-01-03 ENCOUNTER — Emergency Department (HOSPITAL_COMMUNITY): Payer: 59 | Admitting: Certified Registered"

## 2021-01-03 ENCOUNTER — Emergency Department (HOSPITAL_COMMUNITY): Payer: 59

## 2021-01-03 ENCOUNTER — Encounter (HOSPITAL_COMMUNITY): Payer: Self-pay | Admitting: Emergency Medicine

## 2021-01-03 ENCOUNTER — Encounter (HOSPITAL_COMMUNITY)
Admission: EM | Disposition: A | Discharge status: Home / Self Care | Payer: Self-pay | Source: Home / Self Care | Attending: Emergency Medicine

## 2021-01-03 ENCOUNTER — Observation Stay (HOSPITAL_COMMUNITY)
Admission: EM | Admit: 2021-01-03 | Discharge: 2021-01-04 | Disposition: A | Discharge status: Home / Self Care | Payer: 59 | Attending: General Surgery | Admitting: General Surgery

## 2021-01-03 DIAGNOSIS — Z20822 Contact with and (suspected) exposure to covid-19: Secondary | ICD-10-CM | POA: Diagnosis not present

## 2021-01-03 DIAGNOSIS — K3580 Unspecified acute appendicitis: Secondary | ICD-10-CM | POA: Diagnosis not present

## 2021-01-03 DIAGNOSIS — Z794 Long term (current) use of insulin: Secondary | ICD-10-CM | POA: Diagnosis not present

## 2021-01-03 DIAGNOSIS — Z79899 Other long term (current) drug therapy: Secondary | ICD-10-CM | POA: Diagnosis not present

## 2021-01-03 DIAGNOSIS — K760 Fatty (change of) liver, not elsewhere classified: Secondary | ICD-10-CM | POA: Diagnosis not present

## 2021-01-03 DIAGNOSIS — Z7982 Long term (current) use of aspirin: Secondary | ICD-10-CM | POA: Diagnosis not present

## 2021-01-03 DIAGNOSIS — Z7984 Long term (current) use of oral hypoglycemic drugs: Secondary | ICD-10-CM | POA: Insufficient documentation

## 2021-01-03 DIAGNOSIS — R1084 Generalized abdominal pain: Secondary | ICD-10-CM | POA: Diagnosis not present

## 2021-01-03 DIAGNOSIS — K353 Acute appendicitis with localized peritonitis, without perforation or gangrene: Secondary | ICD-10-CM | POA: Diagnosis not present

## 2021-01-03 DIAGNOSIS — E119 Type 2 diabetes mellitus without complications: Secondary | ICD-10-CM | POA: Diagnosis not present

## 2021-01-03 DIAGNOSIS — I10 Essential (primary) hypertension: Secondary | ICD-10-CM | POA: Insufficient documentation

## 2021-01-03 HISTORY — PX: LAPAROSCOPIC APPENDECTOMY: SHX408

## 2021-01-03 LAB — COMPREHENSIVE METABOLIC PANEL
ALT: 30 U/L (ref 0–44)
AST: 33 U/L (ref 15–41)
Albumin: 4.8 g/dL (ref 3.5–5.0)
Alkaline Phosphatase: 63 U/L (ref 38–126)
Anion gap: 9 (ref 5–15)
BUN: 12 mg/dL (ref 6–20)
CO2: 25 mmol/L (ref 22–32)
Calcium: 9 mg/dL (ref 8.9–10.3)
Chloride: 105 mmol/L (ref 98–111)
Creatinine, Ser: 0.55 mg/dL (ref 0.44–1.00)
GFR, Estimated: >60 mL/min (ref >60)
Glucose, Bld: 163 mg/dL — ABNORMAL HIGH (ref 70–99)
Potassium: 3.8 mmol/L (ref 3.5–5.1)
Sodium: 139 mmol/L (ref 135–145)
Total Bilirubin: 0.3 mg/dL (ref 0.3–1.2)
Total Protein: 8.2 g/dL — ABNORMAL HIGH (ref 6.5–8.1)

## 2021-01-03 LAB — URINALYSIS, ROUTINE W REFLEX MICROSCOPIC
Bilirubin Urine: NEGATIVE
Glucose, UA: NEGATIVE mg/dL
Hgb urine dipstick: NEGATIVE
Ketones, ur: NEGATIVE mg/dL
Leukocytes,Ua: NEGATIVE
Nitrite: NEGATIVE
Protein, ur: NEGATIVE mg/dL
Specific Gravity, Urine: 1.031 — ABNORMAL HIGH (ref 1.005–1.030)
pH: 7 (ref 5.0–8.0)

## 2021-01-03 LAB — CBC
HCT: 42.4 % (ref 36.0–46.0)
Hemoglobin: 14.1 g/dL (ref 12.0–15.0)
MCH: 30.6 pg (ref 26.0–34.0)
MCHC: 33.3 g/dL (ref 30.0–36.0)
MCV: 92 fL (ref 80.0–100.0)
Platelets: 214 K/uL (ref 150–400)
RBC: 4.61 MIL/uL (ref 3.87–5.11)
RDW: 13 % (ref 11.5–15.5)
WBC: 10.1 K/uL (ref 4.0–10.5)
nRBC: 0 % (ref 0.0–0.2)

## 2021-01-03 LAB — RESP PANEL BY RT-PCR (FLU A&B, COVID) ARPGX2
Influenza A by PCR: NEGATIVE
Influenza B by PCR: NEGATIVE
SARS Coronavirus 2 by RT PCR: NEGATIVE

## 2021-01-03 LAB — LACTIC ACID, PLASMA
Lactic Acid, Venous: 2.9 mmol/L (ref 0.5–1.9)
Lactic Acid, Venous: 3.3 mmol/L (ref 0.5–1.9)
Lactic Acid, Venous: 3.6 mmol/L (ref 0.5–1.9)

## 2021-01-03 LAB — HEMOGLOBIN A1C
Hgb A1c MFr Bld: 6.3 % — ABNORMAL HIGH (ref 4.8–5.6)
Mean Plasma Glucose: 134.11 mg/dL

## 2021-01-03 LAB — PROTIME-INR
INR: 1.1 (ref 0.8–1.2)
Prothrombin Time: 14.1 s (ref 11.4–15.2)

## 2021-01-03 LAB — GLUCOSE, CAPILLARY
Glucose-Capillary: 137 mg/dL — ABNORMAL HIGH (ref 70–99)
Glucose-Capillary: 148 mg/dL — ABNORMAL HIGH (ref 70–99)
Glucose-Capillary: 253 mg/dL — ABNORMAL HIGH (ref 70–99)
Glucose-Capillary: 147 mg/dL — ABNORMAL HIGH (ref 70–99)

## 2021-01-03 LAB — LIPASE, BLOOD: Lipase: 43 U/L (ref 11–51)

## 2021-01-03 SURGERY — APPENDECTOMY, LAPAROSCOPIC
Anesthesia: General | Site: Abdomen

## 2021-01-03 MED ORDER — ASPIRIN 81 MG PO CHEW
81.0000 mg | CHEWABLE_TABLET | Freq: Every day | ORAL | Status: DC
  Administered 2021-01-04: 81 mg via ORAL
  Filled 2021-01-03: qty 1

## 2021-01-03 MED ORDER — LISINOPRIL-HYDROCHLOROTHIAZIDE 20-12.5 MG PO TABS: 1.0000 | ORAL_TABLET | Freq: Every day | ORAL | Status: DC

## 2021-01-03 MED ORDER — ROCURONIUM BROMIDE 10 MG/ML (PF) SYRINGE
PREFILLED_SYRINGE | INTRAVENOUS | Status: AC
  Filled 2021-01-03: qty 10

## 2021-01-03 MED ORDER — LISINOPRIL 10 MG PO TABS
20.0000 mg | ORAL_TABLET | Freq: Every day | ORAL | Status: DC
  Administered 2021-01-04: 20 mg via ORAL
  Filled 2021-01-03: qty 2

## 2021-01-03 MED ORDER — KETOROLAC TROMETHAMINE 30 MG/ML IJ SOLN
INTRAMUSCULAR | Status: DC | PRN
  Administered 2021-01-03: 30 mg via INTRAVENOUS

## 2021-01-03 MED ORDER — SIMETHICONE 80 MG PO CHEW: 40.0000 mg | CHEWABLE_TABLET | Freq: Four times a day (QID) | ORAL | Status: DC | PRN

## 2021-01-03 MED ORDER — AMLODIPINE BESYLATE 10 MG PO TABS
10.0000 mg | ORAL_TABLET | Freq: Every day | ORAL | Status: DC
  Filled 2021-01-03 (×2): qty 1

## 2021-01-03 MED ORDER — HYDROMORPHONE HCL 1 MG/ML IJ SOLN
1.0000 mg | Freq: Once | INTRAMUSCULAR | Status: AC
  Administered 2021-01-03: 1 mg via INTRAVENOUS
  Filled 2021-01-03: qty 1

## 2021-01-03 MED ORDER — BUPIVACAINE LIPOSOME 1.3 % IJ SUSP
INTRAMUSCULAR | Status: DC | PRN
  Administered 2021-01-03: 20 mL

## 2021-01-03 MED ORDER — LACTATED RINGERS IV SOLN: INTRAVENOUS | Status: DC

## 2021-01-03 MED ORDER — PROPOFOL 10 MG/ML IV BOLUS
INTRAVENOUS | Status: AC
  Filled 2021-01-03: qty 20

## 2021-01-03 MED ORDER — ONDANSETRON 4 MG PO TBDP: 4.0000 mg | ORAL_TABLET | Freq: Four times a day (QID) | ORAL | Status: DC | PRN

## 2021-01-03 MED ORDER — BUPIVACAINE LIPOSOME 1.3 % IJ SUSP
INTRAMUSCULAR | Status: AC
  Filled 2021-01-03: qty 20

## 2021-01-03 MED ORDER — HYDRALAZINE HCL 25 MG PO TABS
25.0000 mg | ORAL_TABLET | Freq: Two times a day (BID) | ORAL | Status: DC
  Administered 2021-01-03 – 2021-01-04 (×2): 25 mg via ORAL
  Filled 2021-01-03 (×2): qty 1

## 2021-01-03 MED ORDER — ACETAMINOPHEN 650 MG RE SUPP
650.0000 mg | Freq: Four times a day (QID) | RECTAL | Status: DC | PRN
  Filled 2021-01-03: qty 1

## 2021-01-03 MED ORDER — HYDROMORPHONE HCL 1 MG/ML IJ SOLN
1.0000 mg | Freq: Once | INTRAMUSCULAR | Status: AC
Start: 2021-01-03 — End: 2021-01-03
  Administered 2021-01-03: 1 mg via INTRAVENOUS
  Filled 2021-01-03: qty 1

## 2021-01-03 MED ORDER — LORAZEPAM 2 MG/ML IJ SOLN: 1.0000 mg | INTRAMUSCULAR | Status: DC | PRN

## 2021-01-03 MED ORDER — MIDAZOLAM HCL 2 MG/2ML IJ SOLN
INTRAMUSCULAR | Status: AC
  Filled 2021-01-03: qty 2

## 2021-01-03 MED ORDER — FENTANYL CITRATE (PF) 100 MCG/2ML IJ SOLN
INTRAMUSCULAR | Status: DC | PRN
  Administered 2021-01-03 (×5): 50 ug via INTRAVENOUS

## 2021-01-03 MED ORDER — ONDANSETRON HCL 4 MG/2ML IJ SOLN
INTRAMUSCULAR | Status: DC | PRN
  Administered 2021-01-03: 4 mg via INTRAVENOUS

## 2021-01-03 MED ORDER — PROPOFOL 10 MG/ML IV BOLUS
INTRAVENOUS | Status: DC | PRN
  Administered 2021-01-03: 200 mg via INTRAVENOUS

## 2021-01-03 MED ORDER — LIDOCAINE HCL (PF) 2 % IJ SOLN
INTRAMUSCULAR | Status: AC
  Filled 2021-01-03: qty 5

## 2021-01-03 MED ORDER — DEXMEDETOMIDINE (PRECEDEX) IN NS 20 MCG/5ML (4 MCG/ML) IV SYRINGE
PREFILLED_SYRINGE | INTRAVENOUS | Status: DC | PRN
  Administered 2021-01-03: 10 ug via INTRAVENOUS

## 2021-01-03 MED ORDER — ORAL CARE MOUTH RINSE: 15.0000 mL | Freq: Once | OROMUCOSAL | Status: AC

## 2021-01-03 MED ORDER — LACTATED RINGERS IV BOLUS: 1000.0000 mL | Freq: Once | INTRAVENOUS | Status: DC

## 2021-01-03 MED ORDER — CHLORHEXIDINE GLUCONATE CLOTH 2 % EX PADS
6.0000 | MEDICATED_PAD | Freq: Once | CUTANEOUS | Status: AC
  Administered 2021-01-03: 6 via TOPICAL

## 2021-01-03 MED ORDER — ONDANSETRON HCL 4 MG/2ML IJ SOLN: 4.0000 mg | Freq: Four times a day (QID) | INTRAMUSCULAR | Status: DC | PRN

## 2021-01-03 MED ORDER — ROCURONIUM BROMIDE 10 MG/ML (PF) SYRINGE
PREFILLED_SYRINGE | INTRAVENOUS | Status: DC | PRN
  Administered 2021-01-03: 35 mg via INTRAVENOUS
  Administered 2021-01-03: 5 mg via INTRAVENOUS

## 2021-01-03 MED ORDER — LACTATED RINGERS IV BOLUS
1000.0000 mL | Freq: Once | INTRAVENOUS | Status: AC
  Administered 2021-01-03: 1000 mL via INTRAVENOUS

## 2021-01-03 MED ORDER — MIDAZOLAM HCL 5 MG/5ML IJ SOLN
INTRAMUSCULAR | Status: DC | PRN
  Administered 2021-01-03: 2 mg via INTRAVENOUS

## 2021-01-03 MED ORDER — HYDROMORPHONE HCL 1 MG/ML IJ SOLN
1.0000 mg | INTRAMUSCULAR | Status: DC | PRN
  Administered 2021-01-03 – 2021-01-04 (×2): 1 mg via INTRAVENOUS
  Filled 2021-01-03 (×2): qty 1

## 2021-01-03 MED ORDER — SODIUM CHLORIDE 0.9 % IV SOLN: INTRAVENOUS | Status: DC

## 2021-01-03 MED ORDER — HYDROCHLOROTHIAZIDE 12.5 MG PO CAPS
12.5000 mg | ORAL_CAPSULE | Freq: Every day | ORAL | Status: DC
  Administered 2021-01-04: 12.5 mg via ORAL
  Filled 2021-01-03: qty 1

## 2021-01-03 MED ORDER — SUCCINYLCHOLINE CHLORIDE 200 MG/10ML IV SOSY
PREFILLED_SYRINGE | INTRAVENOUS | Status: DC | PRN
  Administered 2021-01-03: 120 mg via INTRAVENOUS

## 2021-01-03 MED ORDER — HYDROMORPHONE HCL 1 MG/ML IJ SOLN
0.2500 mg | INTRAMUSCULAR | Status: DC | PRN
  Administered 2021-01-03: 0.5 mg via INTRAVENOUS
  Filled 2021-01-03: qty 0.5

## 2021-01-03 MED ORDER — ALPRAZOLAM 0.5 MG PO TABS
0.5000 mg | ORAL_TABLET | Freq: Every evening | ORAL | Status: DC | PRN
  Administered 2021-01-03: 0.5 mg via ORAL
  Filled 2021-01-03: qty 1

## 2021-01-03 MED ORDER — ONDANSETRON HCL 4 MG/2ML IJ SOLN
INTRAMUSCULAR | Status: AC
  Filled 2021-01-03: qty 2

## 2021-01-03 MED ORDER — LIDOCAINE 2% (20 MG/ML) 5 ML SYRINGE
INTRAMUSCULAR | Status: DC | PRN
  Administered 2021-01-03: 100 mg via INTRAVENOUS

## 2021-01-03 MED ORDER — FENTANYL CITRATE (PF) 100 MCG/2ML IJ SOLN
50.0000 ug | INTRAMUSCULAR | Status: DC | PRN
  Administered 2021-01-03: 50 ug via INTRAVENOUS
  Filled 2021-01-03: qty 2

## 2021-01-03 MED ORDER — SUGAMMADEX SODIUM 200 MG/2ML IV SOLN
INTRAVENOUS | Status: DC | PRN
  Administered 2021-01-03: 200 mg via INTRAVENOUS

## 2021-01-03 MED ORDER — METFORMIN HCL 500 MG PO TABS: 750.0000 mg | ORAL_TABLET | Freq: Every day | ORAL | Status: DC

## 2021-01-03 MED ORDER — ROSUVASTATIN CALCIUM 20 MG PO TABS
40.0000 mg | ORAL_TABLET | Freq: Every day | ORAL | Status: DC
  Administered 2021-01-04: 40 mg via ORAL
  Filled 2021-01-03 (×2): qty 1
  Filled 2021-01-03: qty 2

## 2021-01-03 MED ORDER — ONDANSETRON HCL 4 MG/2ML IJ SOLN: 4.0000 mg | Freq: Once | INTRAMUSCULAR | Status: DC | PRN

## 2021-01-03 MED ORDER — ENOXAPARIN SODIUM 40 MG/0.4ML IJ SOSY
40.0000 mg | PREFILLED_SYRINGE | INTRAMUSCULAR | Status: DC
  Administered 2021-01-04: 40 mg via SUBCUTANEOUS
  Filled 2021-01-03: qty 0.4

## 2021-01-03 MED ORDER — PIPERACILLIN-TAZOBACTAM 3.375 G IVPB
3.3750 g | Freq: Once | INTRAVENOUS | Status: AC
  Administered 2021-01-03: 3.375 g via INTRAVENOUS
  Filled 2021-01-03: qty 50

## 2021-01-03 MED ORDER — CHLORHEXIDINE GLUCONATE 0.12 % MT SOLN
15.0000 mL | Freq: Once | OROMUCOSAL | Status: AC
  Administered 2021-01-03: 15 mL via OROMUCOSAL

## 2021-01-03 MED ORDER — FENTANYL CITRATE (PF) 250 MCG/5ML IJ SOLN
INTRAMUSCULAR | Status: AC
  Filled 2021-01-03: qty 5

## 2021-01-03 MED ORDER — IOHEXOL 350 MG/ML SOLN
100.0000 mL | Freq: Once | INTRAVENOUS | Status: AC | PRN
  Administered 2021-01-03: 100 mL via INTRAVENOUS

## 2021-01-03 MED ORDER — SUCCINYLCHOLINE CHLORIDE 200 MG/10ML IV SOSY
PREFILLED_SYRINGE | INTRAVENOUS | Status: AC
  Filled 2021-01-03: qty 10

## 2021-01-03 MED ORDER — HEMOSTATIC AGENTS (NO CHARGE) OPTIME
TOPICAL | Status: DC | PRN
  Administered 2021-01-03: 1 via TOPICAL

## 2021-01-03 MED ORDER — SODIUM CHLORIDE 0.9 % IV SOLN: Freq: Once | INTRAVENOUS | Status: AC

## 2021-01-03 MED ORDER — ONDANSETRON HCL 4 MG/2ML IJ SOLN
4.0000 mg | Freq: Once | INTRAMUSCULAR | Status: AC
  Administered 2021-01-03: 4 mg via INTRAVENOUS
  Filled 2021-01-03: qty 2

## 2021-01-03 MED ORDER — HYDROCODONE-ACETAMINOPHEN 5-325 MG PO TABS
1.0000 | ORAL_TABLET | ORAL | Status: DC | PRN
  Administered 2021-01-03: 2 via ORAL
  Filled 2021-01-03: qty 2

## 2021-01-03 MED ORDER — DEXAMETHASONE SODIUM PHOSPHATE 10 MG/ML IJ SOLN
INTRAMUSCULAR | Status: DC | PRN
  Administered 2021-01-03: 4 mg via INTRAVENOUS

## 2021-01-03 MED ORDER — ACETAMINOPHEN 325 MG PO TABS: 650.0000 mg | ORAL_TABLET | Freq: Four times a day (QID) | ORAL | Status: DC | PRN

## 2021-01-03 MED ORDER — KETOROLAC TROMETHAMINE 30 MG/ML IJ SOLN
INTRAMUSCULAR | Status: AC
  Filled 2021-01-03: qty 1

## 2021-01-03 MED ORDER — INSULIN ASPART 100 UNIT/ML IJ SOLN
0.0000 [IU] | Freq: Three times a day (TID) | INTRAMUSCULAR | Status: DC
  Administered 2021-01-03: 8 [IU] via SUBCUTANEOUS
  Administered 2021-01-04: 2 [IU] via SUBCUTANEOUS

## 2021-01-03 MED ORDER — CHLORHEXIDINE GLUCONATE CLOTH 2 % EX PADS: 6.0000 | MEDICATED_PAD | Freq: Once | CUTANEOUS | Status: DC

## 2021-01-03 MED ORDER — DEXAMETHASONE SODIUM PHOSPHATE 10 MG/ML IJ SOLN
INTRAMUSCULAR | Status: AC
  Filled 2021-01-03: qty 1

## 2021-01-03 MED ORDER — SODIUM CHLORIDE 0.9 % IR SOLN
Status: DC | PRN
  Administered 2021-01-03: 1000 mL

## 2021-01-03 MED ORDER — PANTOPRAZOLE SODIUM 40 MG PO TBEC
40.0000 mg | DELAYED_RELEASE_TABLET | Freq: Every day | ORAL | Status: DC
  Administered 2021-01-04: 40 mg via ORAL
  Filled 2021-01-03: qty 1

## 2021-01-03 MED ORDER — PIPERACILLIN-TAZOBACTAM 3.375 G IVPB
3.3750 g | Freq: Three times a day (TID) | INTRAVENOUS | Status: DC
  Administered 2021-01-03 – 2021-01-04 (×3): 3.375 g via INTRAVENOUS
  Filled 2021-01-03 (×6): qty 50

## 2021-01-03 MED ORDER — MEPERIDINE HCL 50 MG/ML IJ SOLN: 6.2500 mg | INTRAMUSCULAR | Status: DC | PRN

## 2021-01-03 MED ORDER — AMLODIPINE BESYLATE 5 MG PO TABS
10.0000 mg | ORAL_TABLET | Freq: Every day | ORAL | Status: DC
  Administered 2021-01-04: 10 mg via ORAL
  Filled 2021-01-03: qty 2

## 2021-01-03 SURGICAL SUPPLY — 42 items
APPLICATOR ARISTA FLEXITIP XL (MISCELLANEOUS) ×2 IMPLANT
BAG RETRIEVAL 10 (BASKET) ×1
CHLORAPREP W/TINT 26 (MISCELLANEOUS) ×2 IMPLANT
CLOTH BEACON ORANGE TIMEOUT ST (SAFETY) ×2 IMPLANT
COVER LIGHT HANDLE STERIS (MISCELLANEOUS) ×4 IMPLANT
CUTTER FLEX LINEAR 45M (STAPLE) ×2 IMPLANT
DERMABOND ADVANCED (GAUZE/BANDAGES/DRESSINGS) ×1
DERMABOND ADVANCED .7 DNX12 (GAUZE/BANDAGES/DRESSINGS) ×1 IMPLANT
ELECT REM PT RETURN 9FT ADLT (ELECTROSURGICAL) ×2
ELECTRODE REM PT RTRN 9FT ADLT (ELECTROSURGICAL) ×1 IMPLANT
GAUZE 4X4 16PLY ~~LOC~~+RFID DBL (SPONGE) ×2 IMPLANT
GLOVE SURG POLYISO LF SZ7.5 (GLOVE) ×2 IMPLANT
GLOVE SURG UNDER POLY LF SZ7 (GLOVE) ×6 IMPLANT
GOWN STRL REUS W/TWL LRG LVL3 (GOWN DISPOSABLE) ×4 IMPLANT
HEMOSTAT ARISTA ABSORB 3G PWDR (HEMOSTASIS) ×2 IMPLANT
INST SET LAPROSCOPIC AP (KITS) ×2 IMPLANT
KIT TURNOVER KIT A (KITS) ×2 IMPLANT
MANIFOLD NEPTUNE II (INSTRUMENTS) ×2 IMPLANT
NEEDLE HYPO 18GX1.5 BLUNT FILL (NEEDLE) ×2 IMPLANT
NEEDLE HYPO 21X1.5 SAFETY (NEEDLE) ×2 IMPLANT
NEEDLE INSUFFLATION 14GA 120MM (NEEDLE) ×2 IMPLANT
NS IRRIG 1000ML POUR BTL (IV SOLUTION) ×2 IMPLANT
PACK LAP CHOLE LZT030E (CUSTOM PROCEDURE TRAY) ×2 IMPLANT
PAD ARMBOARD 7.5X6 YLW CONV (MISCELLANEOUS) ×2 IMPLANT
PENCIL HANDSWITCHING (ELECTRODE) ×2 IMPLANT
PENCIL SMOKE EVACUATOR COATED (MISCELLANEOUS) ×2 IMPLANT
RELOAD STAPLE TA45 3.5 REG BLU (ENDOMECHANICALS) ×2 IMPLANT
SET BASIN LINEN APH (SET/KITS/TRAYS/PACK) ×2 IMPLANT
SET TUBE SMOKE EVAC HIGH FLOW (TUBING) ×2 IMPLANT
SHEARS HARMONIC ACE PLUS 36CM (ENDOMECHANICALS) ×2 IMPLANT
SUT MNCRL AB 4-0 PS2 18 (SUTURE) ×4 IMPLANT
SUT VICRYL 0 UR6 27IN ABS (SUTURE) ×4 IMPLANT
SYR 20ML LL LF (SYRINGE) ×4 IMPLANT
SYS BAG RETRIEVAL 10MM (BASKET) ×1
SYSTEM BAG RETRIEVAL 10MM (BASKET) ×1 IMPLANT
TRAY FOLEY W/BAG SLVR 16FR (SET/KITS/TRAYS/PACK) ×2
TRAY FOLEY W/BAG SLVR 16FR ST (SET/KITS/TRAYS/PACK) ×1 IMPLANT
TROCAR ENDO BLADELESS 11MM (ENDOMECHANICALS) ×2 IMPLANT
TROCAR ENDO BLADELESS 12MM (ENDOMECHANICALS) ×2 IMPLANT
TROCAR XCEL NON-BLD 5MMX100MML (ENDOMECHANICALS) ×2 IMPLANT
TUBE CONNECTING 12X1/4 (SUCTIONS) ×2 IMPLANT
WARMER LAPAROSCOPE (MISCELLANEOUS) ×2 IMPLANT

## 2021-01-03 NOTE — Interval H&P Note (Signed)
History and Physical Interval Note:  01/03/2021 8:29 AM  Amy Dean  has presented today for surgery, with the diagnosis of acute appendicitis.  The various methods of treatment have been discussed with the patient and family. After consideration of risks, benefits and other options for treatment, the patient has consented to  Procedure(s): APPENDECTOMY LAPAROSCOPIC (N/A) as a surgical intervention.  The patient's history has been reviewed, patient examined, no change in status, stable for surgery.  I have reviewed the patient's chart and labs.  Questions were answered to the patient's satisfaction.     Aviva Signs

## 2021-01-03 NOTE — ED Triage Notes (Signed)
Pt c/o generalized abd pain that started about 2300. Pt denies any hx of same. Pt denies N/V/D.

## 2021-01-03 NOTE — ED Provider Notes (Signed)
[facility] Provider Note   CSN: [id number] Arrival date & time: 01/03/21  [id number]     History Chief Complaint  Patient presents with   Abdominal Pain    Amy Dean is a 55 y.o. female.   Abdominal Pain Pain location:  Generalized Pain quality: aching and sharp   Pain radiates to:  Does not radiate Pain severity:  Mild Onset quality:  Gradual Duration:  4 hours Timing:  Constant Progression:  Worsening Chronicity:  New Context: not alcohol use, not eating and not recent illness   Relieved by:  Nothing Worsened by:  Nothing Ineffective treatments:  None tried     Past Medical History:  Diagnosis Date   Anemia    Resolved post hysterectomy   Enteritis    Essential hypertension    Headache    Hyperlipidemia    Type 2 diabetes mellitus ([place])     Patient Active Problem List   Diagnosis Date Noted   Dyshidrotic eczema 08/10/2020   Cellulitis of left ring finger 05/05/2020   Right leg swelling 04/06/2020   Breast nodule 04/06/2020   Meningitis 08/06/2017   Abnormal MRI of head 06/20/2017   Confusion 06/20/2017   Nonintractable headache 06/20/2017   Abnormal MRI    Headache 05/11/2017   Obesity (BMI 30.0-34.9) 09/09/2016   Vertigo 09/08/2016   Hypertriglyceridemia 01/12/2015   Hyperlipidemia associated with type 2 diabetes mellitus ([place]) 04/01/2014   Depression with anxiety 01/05/2014   Morbid obesity ([place]) 07/17/2013   GERD (gastroesophageal reflux disease) 12/18/2011   Nonspecific (abnormal) findings on radiological and other examination of gastrointestinal tract 10/26/2011   Enteritis 10/04/2011   Diabetes mellitus type II, controlled ([place]) 03/23/2011    Class: Chronic   ANEMIA 12/10/2008   Essential hypertension 12/10/2008   DYSPHAGIA UNSPECIFIED 12/10/2008    Past Surgical History:  Procedure Laterality Date   [place] and 1989   x2   COLONOSCOPY N/A 04/22/2015    Procedure: COLONOSCOPY;  Surgeon: Rogene Houston, MD;  Location: [facility];  Service: Endoscopy;  Laterality: N/A;  1:00   EUS  10/26/2011   Procedure: UPPER ENDOSCOPIC ULTRASOUND (EUS) LINEAR;  Surgeon: Milus Banister, MD;  Location: [facility] ENDOSCOPY;  Service: Endoscopy;  Laterality: N/A;   HYSTEROSCOPY W/ ENDOMETRIAL ABLATION  07/22/2008   K.Prosser MD [facility]   SUPRACERVICAL ABDOMINAL HYSTERECTOMY  03/21/2011   Procedure: HYSTERECTOMY SUPRACERVICAL ABDOMINAL;  Surgeon: Jonnie Kind, MD;  Location: [facility] ORS;  Service: Gynecology;;   TONSILLECTOMY     TUBAL LIGATION       OB History     Gravida  2   Para  2   Term      Preterm      AB  0   Living  2      SAB  0   IAB      Ectopic      Multiple      Live Births              Family History  Problem Relation Age of Onset   Healthy Son    Sarcoidosis Mother    Heart attack Mother    Stroke Mother    Other Father        unsure of history   Heart attack Paternal Uncle    Hypertension Maternal Grandmother    Anesthesia problems Neg Hx    Hypotension Neg Hx    Malignant  hyperthermia Neg Hx    Pseudochol deficiency Neg Hx     Social History   Tobacco Use   Smoking status: Never   Smokeless tobacco: Never  Vaping Use   Vaping Use: Never used  Substance Use Topics   Alcohol use: Yes    Alcohol/week: 12.0 standard drinks    Types: 12 Cans of beer per week   Drug use: No    Home Medications Prior to Admission medications   Medication Sig Start Date End Date Taking? Authorizing Provider  ALPRAZolam Duanne Moron) 0.5 MG tablet Take 1 tablet (0.5 mg total) by mouth at bedtime as needed for sleep. 12/03/20   Kathyrn Drown, MD  amLODipine (NORVASC) 10 MG tablet Take 1 tablet (10 mg total) by mouth daily. 08/10/20   Kathyrn Drown, MD  aspirin 81 MG tablet Take 81 mg by mouth daily.    [provider]  chlorzoxazone (PARAFON) 500 MG tablet Take one tablet po q 8 hrs prn. HOME USE ONLY 12/27/20   Kathyrn Drown, MD  cholecalciferol (VITAMIN D) 1000 units tablet Take 1,000 Units by mouth daily.    [provider]  estradiol (ESTRACE) 0.5 MG tablet Take 1 tablet (0.5 mg total) by mouth daily. 12/03/20   Kathyrn Drown, MD  etodolac (LODINE) 400 MG tablet Take one tablet po BID prn 12/27/20   Kathyrn Drown, MD  hydrALAZINE (APRESOLINE) 25 MG tablet Take 1 tablet (25 mg total) by mouth 2 (two) times daily. 08/23/20   Kathyrn Drown, MD  Insulin Pen Needle (PEN NEEDLES 31GX5/16") 31G X 8 MM MISC Use daily as directed 07/16/13   Nilda Simmer, NP  Insulin Pen Needle 32G X 4 MM MISC USE DAILY AS DIRECTED 03/22/20 03/22/21  Hurshel Party C, MD  liraglutide (VICTOZA) 18 MG/3ML SOPN Inject 1.2 mg into the skin daily. 08/10/20   Kathyrn Drown, MD  lisinopril-hydrochlorothiazide (ZESTORETIC) 20-12.5 MG tablet Take 1 tablet by mouth daily for blood presure. 08/10/20   Kathyrn Drown, MD  metFORMIN (GLUCOPHAGE) 500 MG tablet Take 1 & 1/2 tablets by mouth in the morning every day 08/19/20   Kathyrn Drown, MD  pantoprazole (PROTONIX) 40 MG tablet Take 1 tablet (40 mg total) by mouth daily for acid reflux. 08/10/20   Kathyrn Drown, MD  progesterone (PROMETRIUM) 100 MG capsule Take 1 capsule (100 mg total) by mouth daily. 12/03/20   Kathyrn Drown, MD  rosuvastatin (CRESTOR) 40 MG tablet Take 1 tablet (40 mg total) by mouth daily. 12/03/20   Kathyrn Drown, MD  triamcinolone cream (KENALOG) 0.1 % Apply a thin amount to finger as needed when flare up occurs 08/10/20   Kathyrn Drown, MD    Allergies    Patient has no active allergies.  Review of Systems   Review of Systems  Gastrointestinal:  Positive for abdominal pain.  All other systems reviewed and are negative.  Physical Exam Updated Vital Signs BP (!) 150/76   Pulse (!) 106   Temp 98.8 F (37.1 C) (Oral)   Resp 16   Ht '5\' 4"'$  (1.626 m)   Wt 90.3 kg   LMP 02/10/2011   SpO2 94%   BMI 34.16 kg/m   Physical Exam Vitals and nursing note  reviewed.  Constitutional:      Appearance: She is well-developed.  HENT:     Head: Normocephalic and atraumatic.  Cardiovascular:     Rate and Rhythm: Normal rate and  regular rhythm.  Pulmonary:     Effort: No respiratory distress.     Breath sounds: No stridor.  Abdominal:     General: Abdomen is flat. Bowel sounds are normal. There is no distension or abdominal bruit.     Tenderness: There is abdominal tenderness (mild).  Musculoskeletal:     Cervical back: Normal range of motion.  Neurological:     Mental Status: She is alert.    ED Results / Procedures / Treatments   Labs (all labs ordered are listed, but only abnormal results are displayed) Labs Reviewed  COMPREHENSIVE METABOLIC PANEL - Abnormal; Notable for the following components:      Result Value   Glucose, Bld 163 (*)    Total Protein 8.2 (*)    All other components within normal limits  URINALYSIS, ROUTINE W REFLEX MICROSCOPIC - Abnormal; Notable for the following components:   Color, Urine STRAW (*)    Specific Gravity, Urine 1.031 (*)    All other components within normal limits  LACTIC ACID, PLASMA - Abnormal; Notable for the following components:   Lactic Acid, Venous 2.9 (*)    All other components within normal limits  LACTIC ACID, PLASMA - Abnormal; Notable for the following components:   Lactic Acid, Venous 3.3 (*)    All other components within normal limits  RESP PANEL BY RT-PCR (FLU A&B, COVID) ARPGX2  SARS CORONAVIRUS 2 (TAT 6-24 HRS)  LIPASE, BLOOD  CBC  PROTIME-INR    EKG None  Radiology CT Angio Abd/Pel W and/or Wo Contrast  Result Date: 01/03/2021 CLINICAL DATA:  55 year old female with generalized abdominal pain since 2300 hours. Query mesenteric ischemia. EXAM: CTA ABDOMEN AND PELVIS WITHOUT AND WITH CONTRAST TECHNIQUE: Multidetector CT imaging of the abdomen and pelvis was performed using the standard protocol during bolus administration of intravenous contrast. Multiplanar  reconstructed images and MIPs were obtained and reviewed to evaluate the vascular anatomy. CONTRAST:  18m OMNIPAQUE IOHEXOL 350 MG/ML SOLN COMPARISON:  CT Abdomen and Pelvis 04/21/2019 and earlier. FINDINGS: VASCULAR Mild Aortoiliac calcified atherosclerosis. The major arterial structures in the abdomen and pelvis are patent. The celiac and SMA are patent with minimal atherosclerosis. Major SMA branches remain patent. IMA is patent. Minimal renal artery atherosclerosis. No arterial branch stenosis identified. On the delayed phase images the portal venous system is patent. Review of the MIP images confirms the above findings. NON-VASCULAR Lower chest: Stable mild cardiomegaly. No pericardial or pleural effusion. Mild lung base atelectasis or scarring is unchanged since 2020. Hepatobiliary: Chronic hepatic steatosis.  Negative gallbladder. Pancreas: Negative. Spleen: Negative. Adrenals/Urinary Tract: Normal adrenal glands. Kidneys enhance symmetrically and are nonobstructed. Ureters are within normal limits. There are numerous chronic pelvic phleboliths. The bladder is mildly to moderately distended but otherwise within normal limits. Stomach/Bowel: Large bowel is decompressed throughout the abdomen and pelvis. The cecum is partially decompressed. The appendix arises from the cecum on series 7, image 72 and initially appears within normal limits. However, tip of the appendix appears irregular and inflamed on series 7, image 65, which is a change from 2019. Appendix: Location: Medial to the cecum Diameter: Up to 10 mm Appendicolith: Possible 7 mm appendicoliths in the mid section of the appendix on series 9, image 73 Mucosal hyper-enhancement: Mild Extraluminal gas: Negative Periappendiceal collection: Negative. The appendix abuts the right ovary. Terminal ileum is decompressed and negative. No dilated small bowel. Negative stomach and duodenum. No free air or free fluid. Lymphatic: No lymphadenopathy. Reproductive:  Stable since 2019, within  normal limits. Other: No pelvic free fluid. Musculoskeletal: No acute osseous abnormality identified. IMPRESSION: 1. Appearance suspicious for Acute Appendicitis, with a possible 7 mm appendicolith in the midportion of the appendix and inflamed appendix tip. Recommend surgery consultation. 2. Mild aortic atherosclerosis. Major arterial structures are patent with no bulky plaque or stenosis identified to predispose to mesenteric ischemia. 3. No other acute or inflammatory process identified in the abdomen or pelvis. Electronically Signed   By: Genevie [name] M.D.   On: 01/03/2021 05:38    Procedures Procedures   Medications Ordered in ED Medications  piperacillin-tazobactam (ZOSYN) IVPB 3.375 g (3.375 g Intravenous New Bag/Given 01/03/21 0614)  lactated ringers bolus 1,000 mL (has no administration in time range)  HYDROmorphone (DILAUDID) injection 1 mg (1 mg Intravenous Given 01/03/21 0338)  ondansetron (ZOFRAN) injection 4 mg (4 mg Intravenous Given 01/03/21 0336)  lactated ringers bolus 1,000 mL (0 mLs Intravenous Stopped 01/03/21 0645)  iohexol (OMNIPAQUE) 350 MG/ML injection 100 mL (100 mLs Intravenous Contrast Given 01/03/21 0445)  HYDROmorphone (DILAUDID) injection 1 mg (1 mg Intravenous Given 01/03/21 0547)    ED Course  I have reviewed the triage vital signs and the nursing notes.  Pertinent labs & imaging results that were available during my care of the patient were reviewed by me and considered in my medical decision making (see chart for details).    MDM Rules/Calculators/A&P                         Pain out of proportion to exam. Pending labs. Will give painm eds, nausea meds, ct angio a/p for mesenteric ischemia.   Appendicitis, uncomplicated. Abx already ordered. Fluids given. Pain meds ordered. D/w Dr. Arnoldo Morale for same.   Final Clinical Impression(s) / ED Diagnoses Final diagnoses:  Acute appendicitis with localized peritonitis, without perforation, abscess,  or gangrene   Rx / DC Orders ED Discharge Orders     None        Kaede Clendenen, Corene Cornea, MD 01/03/21 [phone]

## 2021-01-03 NOTE — ED Notes (Signed)
Date and time results received: 01/03/21 0435 (use smartphrase ".now" to insert current time)  Test: Lactic acid Critical Value: 2.9  Name of Provider Notified: Lauretta Grill, MD

## 2021-01-03 NOTE — Anesthesia Postprocedure Evaluation (Signed)
Anesthesia Post Note  Patient: Amy Dean  Procedure(s) Performed: APPENDECTOMY LAPAROSCOPIC (Abdomen)  Patient location during evaluation: Phase II Anesthesia Type: General Level of consciousness: awake and alert and oriented Pain management: pain level controlled Vital Signs Assessment: post-procedure vital signs reviewed and stable Respiratory status: spontaneous breathing and respiratory function stable Cardiovascular status: blood pressure returned to baseline and stable Postop Assessment: no apparent nausea or vomiting Anesthetic complications: no   No notable events documented.   Last Vitals:  Vitals:   01/03/21 0925 01/03/21 1140  BP: (!) 153/73 (!) 178/87  Pulse: 98 98  Resp: (!) 23 18  Temp: 36.7 C 37.2 C  SpO2: 96% 99%    Last Pain:  Vitals:   01/03/21 1140  TempSrc:   PainSc: Asleep                 Zahari Fazzino C Freddye Cardamone

## 2021-01-03 NOTE — Anesthesia Procedure Notes (Addendum)
Procedure Name: Intubation Date/Time: 01/03/2021 10:29 AM Performed by: Orlie Dakin, CRNA Pre-anesthesia Checklist: Patient identified, Emergency Drugs available, Suction available and Patient being monitored Patient Re-evaluated:Patient Re-evaluated prior to induction Oxygen Delivery Method: Circle system utilized Preoxygenation: Pre-oxygenation with 100% oxygen Induction Type: IV induction, Rapid sequence and Cricoid Pressure applied Laryngoscope Size: Glidescope and 3 Grade View: Grade I Tube type: Oral Tube size: 7.0 mm Number of attempts: 1 Airway Equipment and Method: Rigid stylet and Video-laryngoscopy Placement Confirmation: ETT inserted through vocal cords under direct vision, positive ETCO2 and breath sounds checked- equal and bilateral Secured at: 23 cm Tube secured with: Tape Dental Injury: Teeth and Oropharynx as per pre-operative assessment  Comments: Jaw stiff with DL.  Glidescope  used due to emergent case, noted large tongue, short, thick neck.  4x4s bite block used.

## 2021-01-03 NOTE — Op Note (Signed)
Patient:  Amy Dean  DOB:  [birth year]  MRN:  [id number]   Preop Diagnosis: Acute appendicitis  Postop Diagnosis: Same  Procedure: Laparoscopic appendectomy  Surgeon: Aviva Signs, MD  Anes: General endotracheal  Indications: Patient is a 55 year old black female who presents with a less than 24-hour history of worsening right lower quadrant abdominal pain.  CT scan of the abdomen reveals acute appendicitis with appendicoliths.  The risks and benefits of the procedure including bleeding, infection, and the possibility of an open procedure were fully explained to the patient, who gave informed consent.  Procedure note: The patient was placed in the supine position.  After induction of general endotracheal anesthesia, the abdomen was prepped and draped using the usual sterile technique with ChloraPrep.  Surgical site confirmation was performed.  A supraumbilical incision was made down to the fascia.  Veress needle was introduced into the abdominal cavity and confirmation of placement was done using saline drop test.  The abdomen was then insufflated to 15 mmHg pressure.  An 11 mm trocar was placed under direct visualization without difficulty.  The patient had multiple adhesions of omentum to the anterior abdominal wall due to previous surgery.  I was able to get a 12 mm trocar in the suprapubic region and a 5 mm trocar in the left lower quadrant region.  Using the harmonic scalpel, these adhesions were taken down in order to facilitate exposure.  I was able to locate the appendix.  It was attached to the right pelvis at its distal tip just above the right ovary.  This was freed away without difficulty.  The mesoappendix was divided using the harmonic scalpel down to its base.  The junction of the appendix to the cecum was fully visualized.  A standard Endo GIA was placed across the base the appendix and fired.  The staple line was inspected and noted to be within normal limits.  The  appendix was removed using an Endo Catch bag.  Arista was placed at the mesoappendix mesentery.  All fluid and air were then evacuated from the abdominal cavity prior to the removal of the trochars.  All wounds were irrigated with normal saline.  All wounds were injected with Exparel.  The supraumbilical fascia was reapproximated using an 0 Vicryl interrupted suture.  All skin incisions were closed using a 4-0 Monocryl subcuticular suture.  Dermabond was applied.  All tape and needle counts were correct at the end of the procedure.  The patient was extubated in the operating room and transferred to PACU in stable condition.  Complications: None  EBL: Minimal  Specimen: Appendix

## 2021-01-03 NOTE — H&P (Signed)
Amy Dean is an 55 y.o. female.   Chief Complaint: Right lower quadrant abdominal pain HPI: Patient is a 55 year old black female who presented emergency room with a less than 12-hour history of worsening right lower quadrant abdominal pain and nausea.  CT scan of the abdomen reveals acute appendicitis with appendicoliths.  There is no evidence of perforation.  Patient states she has a decreased appetite.  The pain originally started in the periumbilical region but has now traveled to the right side of her abdomen.  Past Medical History:  Diagnosis Date   Anemia    Resolved post hysterectomy   Enteritis    Essential hypertension    Headache    Hyperlipidemia    Type 2 diabetes mellitus [facility])     Past Surgical History:  Procedure Laterality Date   ABDOMINAL HYSTERECTOMY     CESAREAN SECTION  1982 and 1989   x2   COLONOSCOPY N/A 04/22/2015   Procedure: COLONOSCOPY;  Surgeon: Rogene Houston, MD;  Location: [facility];  Service: Endoscopy;  Laterality: N/A;  1:00   EUS  10/26/2011   Procedure: UPPER ENDOSCOPIC ULTRASOUND (EUS) LINEAR;  Surgeon: Milus Banister, MD;  Location: [facility];  Service: Endoscopy;  Laterality: N/A;   HYSTEROSCOPY W/ ENDOMETRIAL ABLATION  07/22/2008   [facility] [facility]   SUPRACERVICAL ABDOMINAL HYSTERECTOMY  03/21/2011   Procedure: HYSTERECTOMY SUPRACERVICAL ABDOMINAL;  Surgeon: Jonnie Kind, MD;  Location: [facility] ORS;  Service: Gynecology;;   TONSILLECTOMY     TUBAL LIGATION      Family History  Problem Relation Age of Onset   Healthy Son    Sarcoidosis Mother    Heart attack Mother    Stroke Mother    Other Father        unsure of history   Heart attack Paternal Uncle    Hypertension Maternal Grandmother    Anesthesia problems Neg Hx    Hypotension Neg Hx    Malignant hyperthermia Neg Hx    Pseudochol deficiency Neg Hx    Social History:  reports that she has never smoked. She has never used smokeless tobacco. She reports current  alcohol use of about 12.0 standard drinks per week. She reports that she does not use drugs.  Allergies: No Active Allergies  (Not in a hospital admission)   Results for orders placed or performed during the hospital encounter of 01/03/21 (from the past 48 hour(s))  Urinalysis, Routine w reflex microscopic Urine, Clean Catch     Status: Abnormal   Collection Time: 01/03/21  2:07 AM  Result Value Ref Range   Color, Urine STRAW (A) YELLOW   APPearance CLEAR CLEAR   Specific Gravity, Urine 1.031 (H) 1.005 - 1.030   pH 7.0 5.0 - 8.0   Glucose, UA NEGATIVE NEGATIVE mg/dL   Hgb urine dipstick NEGATIVE NEGATIVE   Bilirubin Urine NEGATIVE NEGATIVE   Ketones, ur NEGATIVE NEGATIVE mg/dL   Protein, ur NEGATIVE NEGATIVE mg/dL   Nitrite NEGATIVE NEGATIVE   Leukocytes,Ua NEGATIVE NEGATIVE    Comment: Performed at [facility], [address] [place], [place] [address]  Lipase, blood     Status: None   Collection Time: 01/03/21  3:46 AM  Result Value Ref Range   Lipase 43 11 - 51 U/L    Comment: Performed at [facility], [address], [place], [place] [address]  Comprehensive metabolic panel     Status: Abnormal   Collection Time: 01/03/21  3:46 AM  Result Value  Ref Range   Sodium 139 135 - 145 mmol/L   Potassium 3.8 3.5 - 5.1 mmol/L   Chloride 105 98 - 111 mmol/L   CO2 25 22 - 32 mmol/L   Glucose, Bld 163 (H) 70 - 99 mg/dL    Comment: Glucose reference range applies only to samples taken after fasting for at least 8 hours.   BUN 12 6 - 20 mg/dL   Creatinine, Ser 0.55 0.44 - 1.00 mg/dL   Calcium 9.0 8.9 - 10.3 mg/dL   Total Protein 8.2 (H) 6.5 - 8.1 g/dL   Albumin 4.8 3.5 - 5.0 g/dL   AST 33 15 - 41 U/L   ALT 30 0 - 44 U/L   Alkaline Phosphatase 63 38 - 126 U/L   Total Bilirubin 0.3 0.3 - 1.2 mg/dL   GFR, Estimated >60 >60 mL/min    Comment: (NOTE) Calculated using the CKD-EPI Creatinine Equation (2021)    Anion gap 9 5 - 15    Comment: Performed at [facility], [address] [place], [place] [address]  CBC     Status: None   Collection Time: 01/03/21  3:46 AM  Result Value Ref Range   WBC 10.1 4.0 - 10.5 K/uL   RBC 4.61 3.87 - 5.11 MIL/uL   Hemoglobin 14.1 12.0 - 15.0 g/dL   HCT 42.4 36.0 - 46.0 %   MCV 92.0 80.0 - 100.0 fL   MCH 30.6 26.0 - 34.0 pg   MCHC 33.3 30.0 - 36.0 g/dL   RDW 13.0 11.5 - 15.5 %   Platelets 214 150 - 400 K/uL   nRBC 0.0 0.0 - 0.2 %    Comment: Performed at [facility], [address] [place], [place] [address]  Lactic acid, plasma     Status: Abnormal   Collection Time: 01/03/21  3:46 AM  Result Value Ref Range   Lactic Acid, Venous 2.9 (HH) 0.5 - 1.9 mmol/L    Comment: CRITICAL RESULT CALLED TO, READ BACK BY AND VERIFIED WITH: TEASLEY,B AT 4:35AM ON 01/03/21 BY [facility] Performed at [facility], [address], [place], [place] [address]   Protime-INR     Status: None   Collection Time: 01/03/21  3:46 AM  Result Value Ref Range   Prothrombin Time 14.1 11.4 - 15.2 seconds   INR 1.1 0.8 - 1.2    Comment: (NOTE) INR goal varies based on device and disease states. Performed at [facility], [address], [place], [place] [address]   Lactic acid, plasma     Status: Abnormal   Collection Time: 01/03/21  6:09 AM  Result Value Ref Range   Lactic Acid, Venous 3.3 (HH) 0.5 - 1.9 mmol/L    Comment: CRITICAL RESULT CALLED TO, READ BACK BY AND VERIFIED WITH: CUGINO,M AT 7:00AM ON 01/03/21 BY [facility] Performed at [facility], [address], [place],  [address]    CT Angio Abd/Pel W and/or Wo Contrast  Result Date: 01/03/2021 CLINICAL DATA:  55 year old female with generalized abdominal pain since 2300 hours. Query mesenteric ischemia. EXAM: CTA ABDOMEN AND PELVIS WITHOUT AND WITH CONTRAST TECHNIQUE: Multidetector CT imaging of the abdomen and pelvis was performed using the standard protocol during bolus administration of intravenous contrast. Multiplanar reconstructed images and MIPs were obtained and reviewed to  evaluate the vascular anatomy. CONTRAST:  125m OMNIPAQUE IOHEXOL 350 MG/ML SOLN COMPARISON:  CT Abdomen and Pelvis 04/21/2019 and earlier. FINDINGS: VASCULAR Mild Aortoiliac calcified atherosclerosis. The major arterial structures in the abdomen  and pelvis are patent. The celiac and SMA are patent with minimal atherosclerosis. Major SMA branches remain patent. IMA is patent. Minimal renal artery atherosclerosis. No arterial branch stenosis identified. On the delayed phase images the portal venous system is patent. Review of the MIP images confirms the above findings. NON-VASCULAR Lower chest: Stable mild cardiomegaly. No pericardial or pleural effusion. Mild lung base atelectasis or scarring is unchanged since 2020. Hepatobiliary: Chronic hepatic steatosis.  Negative gallbladder. Pancreas: Negative. Spleen: Negative. Adrenals/Urinary Tract: Normal adrenal glands. Kidneys enhance symmetrically and are nonobstructed. Ureters are within normal limits. There are numerous chronic pelvic phleboliths. The bladder is mildly to moderately distended but otherwise within normal limits. Stomach/Bowel: Large bowel is decompressed throughout the abdomen and pelvis. The cecum is partially decompressed. The appendix arises from the cecum on series 7, image 72 and initially appears within normal limits. However, tip of the appendix appears irregular and inflamed on series 7, image 65, which is a change from 2019. Appendix: Location: Medial to the cecum Diameter: Up to 10 mm Appendicolith: Possible 7 mm appendicoliths in the mid section of the appendix on series 9, image 73 Mucosal hyper-enhancement: Mild Extraluminal gas: Negative Periappendiceal collection: Negative. The appendix abuts the right ovary. Terminal ileum is decompressed and negative. No dilated small bowel. Negative stomach and duodenum. No free air or free fluid. Lymphatic: No lymphadenopathy. Reproductive: Stable since 2019, within normal limits. Other: No pelvic  free fluid. Musculoskeletal: No acute osseous abnormality identified. IMPRESSION: 1. Appearance suspicious for Acute Appendicitis, with a possible 7 mm appendicolith in the midportion of the appendix and inflamed appendix tip. Recommend surgery consultation. 2. Mild aortic atherosclerosis. Major arterial structures are patent with no bulky plaque or stenosis identified to predispose to mesenteric ischemia. 3. No other acute or inflammatory process identified in the abdomen or pelvis. Electronically Signed   By: Genevie [name] M.D.   On: 01/03/2021 05:38    Review of Systems  Constitutional:  Positive for appetite change and fatigue.  HENT: Negative.    Eyes: Negative.   Respiratory: Negative.    Cardiovascular: Negative.   Gastrointestinal:  Positive for abdominal pain and nausea. Negative for diarrhea.  Endocrine: Negative.   Genitourinary: Negative.   Musculoskeletal: Negative.   Skin: Negative.   Allergic/Immunologic: Negative.   Neurological: Negative.   Hematological: Negative.   Psychiatric/Behavioral: Negative.     Blood pressure (!) 150/76, pulse (!) 106, temperature 98.8 F (37.1 C), temperature source Oral, resp. rate 16, height '5\' 4"'$  (1.626 m), weight 90.3 kg, last menstrual period 02/10/2011, SpO2 94 %. Physical Exam Vitals reviewed.  Constitutional:      Appearance: She is well-developed. She is not ill-appearing.  HENT:     Head: Normocephalic and atraumatic.  Cardiovascular:     Rate and Rhythm: Normal rate and regular rhythm.     Heart sounds: Normal heart sounds. No murmur heard.   No gallop.  Pulmonary:     Effort: Pulmonary effort is normal. No respiratory distress.     Breath sounds: Normal breath sounds. No stridor. No wheezing, rhonchi or rales.  Abdominal:     General: Bowel sounds are normal.     Palpations: Abdomen is soft.     Tenderness: There is abdominal tenderness in the right lower quadrant. There is no rebound. Positive signs include McBurney's sign.      Hernia: No hernia is present.  Skin:    General: Skin is warm and dry.  Neurological:     General: No  focal deficit present.     Mental Status: She is alert.    CT scan images personally reviewed Assessment/Plan Impression: Acute appendicitis Plan: Patient be taken to the operating room for a laparoscopic appendectomy.  The risks and benefits of the procedure including bleeding, infection, and the possibility of an open procedure were fully explained to the patient, who gave informed consent.  She does have a slightly elevated lactic acid level and this is being treated with IV fluid boluses.  She has received Zosyn preoperatively.  Aviva Signs, MD 01/03/2021, 8:26 AM

## 2021-01-03 NOTE — Transfer of Care (Signed)
Immediate Anesthesia Transfer of Care Note  Patient: Amy Dean  Procedure(s) Performed: APPENDECTOMY LAPAROSCOPIC (Abdomen)  Patient Location: PACU  Anesthesia Type:General  Level of Consciousness: awake and oriented  Airway & Oxygen Therapy: Patient Spontanous Breathing and Patient connected to face mask oxygen  Post-op Assessment: Report given to RN and Post -op Vital signs reviewed and stable  Post vital signs: Reviewed and stable  Last Vitals:  Vitals Value Taken Time  BP 178/87 01/03/21 1136  Temp    Pulse 100 01/03/21 1137  Resp 15 01/03/21 1137  SpO2 99 % 01/03/21 1137  Vitals shown include unvalidated device data.  Last Pain:  Vitals:   01/03/21 0925  TempSrc: Oral  PainSc: 7       Patients Stated Pain Goal: 7 ([id number])  Complications: No notable events documented.

## 2021-01-03 NOTE — Anesthesia Preprocedure Evaluation (Signed)
Anesthesia Evaluation  Patient identified by MRN, date of birth, ID band Patient awake    Reviewed: Allergy & Precautions, NPO status , Patient's Chart, lab work & pertinent test results  History of Anesthesia Complications Negative for: history of anesthetic complications  Airway Mallampati: III  TM Distance: >3 FB Neck ROM: Full    Dental  (+) Dental Advisory Given, Teeth Intact   Pulmonary sleep apnea (snoring) ,    Pulmonary exam normal breath sounds clear to auscultation       Cardiovascular Exercise Tolerance: Good hypertension, Pt. on medications Normal cardiovascular exam Rhythm:Regular Rate:Normal  There was no ST segment deviation noted during stress. This is an intermediate risk study. Risk primarily based on decreased LVEF, consider echo to further evaluate The left ventricular ejection fraction is mildly decreased (48%). Small apical defect likely due to breast attenuation, cannot completely exclude a small mild component of ischemia.     Neuro/Psych  Headaches, PSYCHIATRIC DISORDERS Anxiety Depression    GI/Hepatic GERD  Medicated and Controlled,  Endo/Other  diabetes, Well Controlled, Type 2, Oral Hypoglycemic Agents  Renal/GU      Musculoskeletal negative musculoskeletal ROS (+)   Abdominal   Peds  Hematology  (+) anemia ,   Anesthesia Other Findings Lactic acidosis   Reproductive/Obstetrics negative OB ROS                             Anesthesia Physical Anesthesia Plan  ASA: 3 and emergent  Anesthesia Plan: General   Post-op Pain Management:    Induction: Intravenous  PONV Risk Score and Plan: 4 or greater and Ondansetron, Dexamethasone and Midazolam  Airway Management Planned: Oral ETT  Additional Equipment:   Intra-op Plan:   Post-operative Plan: Extubation in OR  Informed Consent: I have reviewed the patients History and Physical, chart, labs and  discussed the procedure including the risks, benefits and alternatives for the proposed anesthesia with the patient or authorized representative who has indicated his/her understanding and acceptance.     Dental advisory given  Plan Discussed with: CRNA and Surgeon  Anesthesia Plan Comments:         Anesthesia Quick Evaluation

## 2021-01-03 NOTE — ED Notes (Signed)
Date and time results received: 01/03/21  (use smartphrase ".now" to insert current time)  Test: lactic Critical Value: 3.3  Name of Provider Notified: [facility]  Orders Received? Or Actions Taken?: n/a

## 2021-01-04 ENCOUNTER — Encounter (HOSPITAL_COMMUNITY): Payer: Self-pay | Admitting: General Surgery

## 2021-01-04 DIAGNOSIS — Z794 Long term (current) use of insulin: Secondary | ICD-10-CM | POA: Diagnosis not present

## 2021-01-04 DIAGNOSIS — E119 Type 2 diabetes mellitus without complications: Secondary | ICD-10-CM | POA: Diagnosis not present

## 2021-01-04 DIAGNOSIS — Z20822 Contact with and (suspected) exposure to covid-19: Secondary | ICD-10-CM | POA: Diagnosis not present

## 2021-01-04 DIAGNOSIS — Z7982 Long term (current) use of aspirin: Secondary | ICD-10-CM | POA: Diagnosis not present

## 2021-01-04 DIAGNOSIS — Z7984 Long term (current) use of oral hypoglycemic drugs: Secondary | ICD-10-CM | POA: Diagnosis not present

## 2021-01-04 DIAGNOSIS — I10 Essential (primary) hypertension: Secondary | ICD-10-CM | POA: Diagnosis not present

## 2021-01-04 DIAGNOSIS — K353 Acute appendicitis with localized peritonitis, without perforation or gangrene: Secondary | ICD-10-CM | POA: Diagnosis not present

## 2021-01-04 DIAGNOSIS — Z79899 Other long term (current) drug therapy: Secondary | ICD-10-CM | POA: Diagnosis not present

## 2021-01-04 LAB — CBC
HCT: 33.7 % — ABNORMAL LOW (ref 36.0–46.0)
Hemoglobin: 11.3 g/dL — ABNORMAL LOW (ref 12.0–15.0)
MCH: 31.1 pg (ref 26.0–34.0)
MCHC: 33.5 g/dL (ref 30.0–36.0)
MCV: 92.8 fL (ref 80.0–100.0)
Platelets: 167 10*3/uL (ref 150–400)
RBC: 3.63 MIL/uL — ABNORMAL LOW (ref 3.87–5.11)
RDW: 13 % (ref 11.5–15.5)
WBC: 7 10*3/uL (ref 4.0–10.5)
nRBC: 0 % (ref 0.0–0.2)

## 2021-01-04 LAB — GLUCOSE, CAPILLARY: Glucose-Capillary: 137 mg/dL — ABNORMAL HIGH (ref 70–99)

## 2021-01-04 LAB — BASIC METABOLIC PANEL
Anion gap: 7 (ref 5–15)
BUN: 11 mg/dL (ref 6–20)
CO2: 24 mmol/L (ref 22–32)
Calcium: 8.6 mg/dL — ABNORMAL LOW (ref 8.9–10.3)
Chloride: 104 mmol/L (ref 98–111)
Creatinine, Ser: 0.68 mg/dL (ref 0.44–1.00)
GFR, Estimated: >60 mL/min (ref >60)
Glucose, Bld: 162 mg/dL — ABNORMAL HIGH (ref 70–99)
Potassium: 3.7 mmol/L (ref 3.5–5.1)
Sodium: 135 mmol/L (ref 135–145)

## 2021-01-04 LAB — LACTIC ACID, PLASMA: Lactic Acid, Venous: 1.9 mmol/L (ref 0.5–1.9)

## 2021-01-04 LAB — SURGICAL PATHOLOGY

## 2021-01-04 MED ORDER — HYDROCODONE-ACETAMINOPHEN 5-325 MG PO TABS: 1.0000 | ORAL_TABLET | ORAL | 0 refills | Status: DC | PRN

## 2021-01-04 NOTE — Discharge Summary (Signed)
Physician Discharge Summary  Patient ID: Amy Dean MRN: [id number] DOB/AGE: 06/20/1965 55 y.o.  Admit date: 01/03/2021 Discharge date: 01/04/2021  Admission Diagnoses: Acute appendicitis  Discharge Diagnoses: Same Active Problems:   Acute appendicitis Hypertension, insulin-dependent diabetes mellitus  Discharged Condition: good  Hospital Course: Patient is a 55 year old white female who presented emergency room with worsening right lower quadrant abdominal pain.  CT scan of the abdomen revealed acute appendicitis.  The patient was noted to have an elevated lactic acid level.  She underwent laparoscopic appendectomy on 01/03/2021.  She tolerated the procedure well.  Her postoperative course has been unremarkable.  Her diet was advanced without difficulty.  Her lactic acid level has returned to normal.  She is being discharged home on 01/04/2021 in good and improving condition.  Treatments: surgery: Laparoscopic appendectomy on 01/03/2021  Discharge Exam: Blood pressure 138/76, pulse 84, temperature 98.5 F (36.9 C), temperature source Oral, resp. rate 18, height '5\' 4"'$  (1.626 m), weight 90.3 kg, last menstrual period 02/10/2011, SpO2 95 %. General appearance: alert, cooperative, and no distress Resp: clear to auscultation bilaterally Cardio: regular rate and rhythm, S1, S2 normal, no murmur, click, rub or gallop GI: Soft, incisions healing well.  Disposition: Discharge disposition: 01-Home or Self Care       Discharge Instructions     Diet - low sodium heart healthy   Complete by: As directed    Increase activity slowly   Complete by: As directed       Allergies as of 01/04/2021   No Known Allergies      Medication List     TAKE these medications    ALPRAZolam 0.5 MG tablet Commonly known as: XANAX Take 1 tablet (0.5 mg total) by mouth at bedtime as needed for sleep.   amLODipine 10 MG tablet Commonly known as: NORVASC Take 1 tablet (10 mg total) by  mouth daily.   aspirin 81 MG tablet Take 81 mg by mouth daily.   chlorzoxazone 500 MG tablet Commonly known as: PARAFON Take one tablet po q 8 hrs prn. HOME USE ONLY   cholecalciferol 1000 units tablet Commonly known as: VITAMIN D Take 1,000 Units by mouth daily.   estradiol 0.5 MG tablet Commonly known as: ESTRACE Take 1 tablet (0.5 mg total) by mouth daily.   etodolac 400 MG tablet Commonly known as: Lodine Take one tablet po BID prn   Fish Oil 1000 MG Caps Take 1 capsule by mouth daily.   hydrALAZINE 25 MG tablet Commonly known as: APRESOLINE Take 1 tablet (25 mg total) by mouth 2 (two) times daily.   HYDROcodone-acetaminophen 5-325 MG tablet Commonly known as: Norco Take 1 tablet by mouth every 4 (four) hours as needed for moderate pain.   lisinopril-hydrochlorothiazide 20-12.5 MG tablet Commonly known as: ZESTORETIC Take 1 tablet by mouth daily for blood presure.   metFORMIN 500 MG tablet Commonly known as: GLUCOPHAGE Take 1 & 1/2 tablets by mouth in the morning every day   pantoprazole 40 MG tablet Commonly known as: PROTONIX Take 1 tablet (40 mg total) by mouth daily for acid reflux.   PEN NEEDLES 31GX5/16" 31G X 8 MM Misc Use daily as directed   Unifine Pentips 32G X 4 MM Misc Generic drug: Insulin Pen Needle USE DAILY AS DIRECTED   progesterone 100 MG capsule Commonly known as: PROMETRIUM Take 1 capsule (100 mg total) by mouth daily.   rosuvastatin 40 MG tablet Commonly known as: CRESTOR Take 1 tablet (40 mg total) by  mouth daily.   triamcinolone cream 0.1 % Commonly known as: KENALOG Apply a thin amount to finger as needed when flare up occurs   Victoza 18 MG/3ML Sopn Generic drug: liraglutide Inject 1.2 mg into the skin daily.        Follow-up Information     Aviva Signs, MD Follow up.   Specialty: General Surgery Why: As needed. Contact information: [address] [id number] [phone]                  Signed: Aviva Signs 01/04/2021, 9:23 AM

## 2021-01-04 NOTE — Progress Notes (Signed)
Nsg Discharge Note  Admit Date:  01/03/2021 Discharge date: 01/04/2021   ALEJANDRINA KILLINS to be D/C'd Home per MD order.  AVS completed.  Copy for chart, and copy for patient signed, and dated. Patient/caregiver able to verbalize understanding.  Discharge Medication: Allergies as of 01/04/2021   No Known Allergies      Medication List     TAKE these medications    ALPRAZolam 0.5 MG tablet Commonly known as: XANAX Take 1 tablet (0.5 mg total) by mouth at bedtime as needed for sleep.   amLODipine 10 MG tablet Commonly known as: NORVASC Take 1 tablet (10 mg total) by mouth daily.   aspirin 81 MG tablet Take 81 mg by mouth daily.   chlorzoxazone 500 MG tablet Commonly known as: PARAFON Take one tablet po q 8 hrs prn. HOME USE ONLY   cholecalciferol 1000 units tablet Commonly known as: VITAMIN D Take 1,000 Units by mouth daily.   estradiol 0.5 MG tablet Commonly known as: ESTRACE Take 1 tablet (0.5 mg total) by mouth daily.   etodolac 400 MG tablet Commonly known as: Lodine Take one tablet po BID prn   Fish Oil 1000 MG Caps Take 1 capsule by mouth daily.   hydrALAZINE 25 MG tablet Commonly known as: APRESOLINE Take 1 tablet (25 mg total) by mouth 2 (two) times daily.   HYDROcodone-acetaminophen 5-325 MG tablet Commonly known as: Norco Take 1 tablet by mouth every 4 (four) hours as needed for moderate pain.   lisinopril-hydrochlorothiazide 20-12.5 MG tablet Commonly known as: ZESTORETIC Take 1 tablet by mouth daily for blood presure.   metFORMIN 500 MG tablet Commonly known as: GLUCOPHAGE Take 1 & 1/2 tablets by mouth in the morning every day   pantoprazole 40 MG tablet Commonly known as: PROTONIX Take 1 tablet (40 mg total) by mouth daily for acid reflux.   PEN NEEDLES 31GX5/16" 31G X 8 MM Misc Use daily as directed   Unifine Pentips 32G X 4 MM Misc Generic drug: Insulin Pen Needle USE DAILY AS DIRECTED   progesterone 100 MG capsule Commonly  known as: PROMETRIUM Take 1 capsule (100 mg total) by mouth daily.   rosuvastatin 40 MG tablet Commonly known as: CRESTOR Take 1 tablet (40 mg total) by mouth daily.   triamcinolone cream 0.1 % Commonly known as: KENALOG Apply a thin amount to finger as needed when flare up occurs   Victoza 18 MG/3ML Sopn Generic drug: liraglutide Inject 1.2 mg into the skin daily.        Discharge Assessment: Vitals:   01/04/21 0135 01/04/21 0450  BP: (!) 141/75 138/76  Pulse: 75 84  Resp: 17 18  Temp: 98.2 F (36.8 C) 98.5 F (36.9 C)  SpO2: 97% 95%   Skin clean, dry and intact without evidence of skin break down, no evidence of skin tears noted. IV catheter discontinued intact. Site without signs and symptoms of complications - no redness or edema noted at insertion site, patient denies c/o pain - only slight tenderness at site.  Dressing with slight pressure applied.  D/c Instructions-Education: Discharge instructions given to patient/family with verbalized understanding. D/c education completed with patient/family including follow up instructions, medication list, d/c activities limitations if indicated, with other d/c instructions as indicated by MD - patient able to verbalize understanding, all questions fully answered. Patient instructed to return to ED, call 911, or call MD for any changes in condition.  Patient escorted via [place], and D/C home via private auto.  Noble Bodie,  Rolanda Lundborg, RN 01/04/2021 11:24 AM

## 2021-01-11 ENCOUNTER — Other Ambulatory Visit (HOSPITAL_COMMUNITY): Payer: Self-pay

## 2021-01-12 ENCOUNTER — Encounter: Payer: Self-pay | Admitting: Family Medicine

## 2021-01-12 ENCOUNTER — Ambulatory Visit
Admission: EM | Admit: 2021-01-12 | Discharge: 2021-01-12 | Disposition: A | Discharge status: Home / Self Care | Payer: 59 | Attending: Family Medicine | Admitting: Family Medicine

## 2021-01-12 ENCOUNTER — Other Ambulatory Visit: Payer: Self-pay

## 2021-01-12 DIAGNOSIS — M62830 Muscle spasm of back: Secondary | ICD-10-CM | POA: Diagnosis not present

## 2021-01-12 MED ORDER — PREDNISONE 20 MG PO TABS: 40.0000 mg | ORAL_TABLET | Freq: Every day | ORAL | 0 refills | Status: DC

## 2021-01-12 NOTE — ED Triage Notes (Signed)
Patient presents to Urgent Care with complaints of upper back pain x 1 month. Pt states she has a hx of chronic lower back pain and states pain has radiated to upper back. Treating pain with hydrocodone and parafon with minimal relief.   Denies fever.

## 2021-01-12 NOTE — ED Provider Notes (Signed)
[facility]   LI:[id number] 01/12/21 Arrival Time: 0808  ASSESSMENT & PLAN:  1. Muscle spasm of back    Able to ambulate here and hemodynamically stable. No indication for imaging of back at this time given no trauma and normal neurological exam. Discussed. Begin trial of: Meds ordered this encounter  Medications   predniSONE (DELTASONE) 20 MG tablet    Sig: Take 2 tablets (40 mg total) by mouth daily.    Dispense:  10 tablet    Refill:  0   Encourage ROM/mobility as tolerated.  Recommend:  Follow-up Information     Luking, Elayne Snare, MD.   Specialty: Family Medicine Why: If worsening or failing to improve as anticipated. Contact information: [place] [address] [phone]         Schedule an appointment as soon as possible for a visit  with [place].   Contact information: [address] [place] [id number]                Reviewed expectations re: course of current medical issues. Questions answered. Outlined signs and symptoms indicating need for more acute intervention. Patient verbalized understanding. After Visit Summary given.   SUBJECTIVE: History from: patient. Amy Dean is a 55 y.o. female who presents with complaint of upper and lower back pain; off/on past month; no trauma. Muscle relaxer without much help; hydrocodone helps temporarily. No extremity sensation changes or weakness. No abd pain. Ambulatory without difficulty.  Reports no chronic steroid use, fevers, IV drug use, or recent back surgeries or procedures.  OBJECTIVE:  Vitals:   01/12/21 0822  BP: (!) 158/84  Pulse: 98  Resp: 20  Temp: 98.2 F (36.8 C)  TempSrc: Oral  SpO2: 97%    General appearance: alert; no distress HEENT: [place]; AT Neck: supple with FROM; without midline tenderness CV: regular Lungs: unlabored respirations; speaks full sentences without  difficulty Abdomen: soft, non-tender; non-distended Back: poorly localized tenderness to palpation over lower and upper paraspinal musculature - seems more tender on left side ; FROM at waist; bruising: none; without midline tenderness Extremities: without edema; symmetrical without gross deformities; normal ROM of bilateral LE Skin: warm and dry Neurologic: normal gait; normal sensation and strength of bilateral LE Psychological: alert and cooperative; normal mood and affect   No Known Allergies  Past Medical History:  Diagnosis Date   Anemia    Resolved post hysterectomy   Enteritis    Essential hypertension    Headache    Hyperlipidemia    Type 2 diabetes mellitus ([place])    Social History   Socioeconomic History   Marital status: Married    Spouse name: Devonne, Santellano.   Number of children: 1   Years of education: 12th grade   Highest education level: Not on file  Occupational History   Occupation: CSPDT    Employer: [employer]  Tobacco Use   Smoking status: Never   Smokeless tobacco: Never  Vaping Use   Vaping Use: Never used  Substance and Sexual Activity   Alcohol use: Yes    Alcohol/week: 12.0 standard drinks    Types: 12 Cans of beer per week   Drug use: No   Sexual activity: Yes    Birth control/protection: Surgical  Other Topics Concern   Not on file  Social History Narrative   Lives with her husband,married for 33 years.   Right-handed.   Occasional soda (  1-2 times per week).   Works at [employer].   Social Determinants of Health   Financial Resource Strain: Not on file  Food Insecurity: Not on file  Transportation Needs: Not on file  Physical Activity: Not on file  Stress: Not on file  Social Connections: Not on file  Intimate Partner Violence: Not on file   Family History  Problem Relation Age of Onset   Healthy Son    Sarcoidosis Mother    Heart attack Mother    Stroke Mother    Other Father        unsure of  history   Heart attack Paternal Uncle    Hypertension Maternal Grandmother    Anesthesia problems Neg Hx    Hypotension Neg Hx    Malignant hyperthermia Neg Hx    Pseudochol deficiency Neg Hx    Past Surgical History:  Procedure Laterality Date   ABDOMINAL HYSTERECTOMY     [place] and 1989   x2   COLONOSCOPY N/A 04/22/2015   Procedure: COLONOSCOPY;  Surgeon: Rogene Houston, MD;  Location: [facility];  Service: Endoscopy;  Laterality: N/A;  1:00   EUS  10/26/2011   Procedure: UPPER ENDOSCOPIC ULTRASOUND (EUS) LINEAR;  Surgeon: Milus Banister, MD;  Location: [facility];  Service: Endoscopy;  Laterality: N/A;   HYSTEROSCOPY W/ ENDOMETRIAL ABLATION  07/22/2008   K.Harrington Challenger MD [facility]   LAPAROSCOPIC APPENDECTOMY N/A 01/03/2021   Procedure: APPENDECTOMY LAPAROSCOPIC;  Surgeon: Aviva Signs, MD;  Location: [facility] ORS;  Service: General;  Laterality: N/A;   SUPRACERVICAL ABDOMINAL HYSTERECTOMY  03/21/2011   Procedure: HYSTERECTOMY SUPRACERVICAL ABDOMINAL;  Surgeon: Jonnie Kind, MD;  Location: [facility] ORS;  Service: Gynecology;;   TONSILLECTOMY     TUBAL Ezequiel Essex, MD 01/12/21 1918

## 2021-01-12 NOTE — Discharge Instructions (Addendum)
Watch your blood sugars. They will increase while on prednisone.

## 2021-01-16 ENCOUNTER — Telehealth (INDEPENDENT_AMBULATORY_CARE_PROVIDER_SITE_OTHER): Payer: 59 | Admitting: General Surgery

## 2021-01-16 DIAGNOSIS — Z09 Encounter for follow-up examination after completed treatment for conditions other than malignant neoplasm: Secondary | ICD-10-CM

## 2021-01-16 NOTE — Telephone Encounter (Signed)
Postoperative telephone visit performed with patient.  Patient states she is doing well.  She has no complaints.  She will be returning to work tomorrow here at [employer].  As this was a part of the global surgical fee, this was not a billable visit.  Total telephone time was 1 minute.

## 2021-01-21 ENCOUNTER — Other Ambulatory Visit (HOSPITAL_COMMUNITY): Payer: Self-pay

## 2021-03-07 ENCOUNTER — Other Ambulatory Visit (HOSPITAL_COMMUNITY): Payer: Self-pay | Admitting: Family Medicine

## 2021-03-07 ENCOUNTER — Other Ambulatory Visit (HOSPITAL_COMMUNITY): Payer: Self-pay

## 2021-03-07 DIAGNOSIS — Z1231 Encounter for screening mammogram for malignant neoplasm of breast: Secondary | ICD-10-CM

## 2021-03-18 ENCOUNTER — Other Ambulatory Visit (HOSPITAL_COMMUNITY): Payer: Self-pay

## 2021-04-13 ENCOUNTER — Other Ambulatory Visit (HOSPITAL_COMMUNITY): Payer: Self-pay

## 2021-04-14 ENCOUNTER — Encounter: Payer: Self-pay | Admitting: Family Medicine

## 2021-04-14 DIAGNOSIS — E1169 Type 2 diabetes mellitus with other specified complication: Secondary | ICD-10-CM

## 2021-04-14 DIAGNOSIS — E119 Type 2 diabetes mellitus without complications: Secondary | ICD-10-CM

## 2021-04-14 DIAGNOSIS — I10 Essential (primary) hypertension: Secondary | ICD-10-CM

## 2021-04-14 NOTE — Telephone Encounter (Signed)
Nurses Lipid liver met7 urine ACR,HgA1C  Also tell Amy Dean Lactic acid was due to the infection and by the time she left the hospital it was normal therefore does not need repeating ( this test goes up withserious infections but only while the patient is infected)

## 2021-04-25 ENCOUNTER — Other Ambulatory Visit: Payer: Self-pay

## 2021-04-25 ENCOUNTER — Ambulatory Visit (HOSPITAL_COMMUNITY)
Admission: RE | Admit: 2021-04-25 | Discharge: 2021-04-25 | Disposition: A | Discharge status: Home / Self Care | Payer: 59 | Source: Ambulatory Visit | Attending: Family Medicine | Admitting: Family Medicine

## 2021-04-25 DIAGNOSIS — Z1231 Encounter for screening mammogram for malignant neoplasm of breast: Secondary | ICD-10-CM | POA: Insufficient documentation

## 2021-05-09 ENCOUNTER — Other Ambulatory Visit (HOSPITAL_COMMUNITY): Payer: Self-pay

## 2021-05-11 ENCOUNTER — Other Ambulatory Visit (HOSPITAL_COMMUNITY): Payer: Self-pay

## 2021-05-11 DIAGNOSIS — E119 Type 2 diabetes mellitus without complications: Secondary | ICD-10-CM | POA: Diagnosis not present

## 2021-05-11 DIAGNOSIS — E785 Hyperlipidemia, unspecified: Secondary | ICD-10-CM | POA: Diagnosis not present

## 2021-05-11 DIAGNOSIS — E1169 Type 2 diabetes mellitus with other specified complication: Secondary | ICD-10-CM | POA: Diagnosis not present

## 2021-05-11 DIAGNOSIS — I10 Essential (primary) hypertension: Secondary | ICD-10-CM | POA: Diagnosis not present

## 2021-05-12 LAB — LIPID PANEL
Chol/HDL Ratio: 3.3 ratio (ref 0.0–4.4)
Cholesterol, Total: 122 mg/dL (ref 100–199)
HDL: 37 mg/dL — ABNORMAL LOW (ref >39)
LDL Chol Calc (NIH): 57 mg/dL (ref 0–99)
Triglycerides: 166 mg/dL — ABNORMAL HIGH (ref 0–149)
VLDL Cholesterol Cal: 28 mg/dL (ref 5–40)

## 2021-05-12 LAB — BASIC METABOLIC PANEL
BUN/Creatinine Ratio: 19 (ref 9–23)
BUN: 11 mg/dL (ref 6–24)
CO2: 23 mmol/L (ref 20–29)
Calcium: 9.5 mg/dL (ref 8.7–10.2)
Chloride: 101 mmol/L (ref 96–106)
Creatinine, Ser: 0.58 mg/dL (ref 0.57–1.00)
Glucose: 131 mg/dL — ABNORMAL HIGH (ref 70–99)
Potassium: 4.1 mmol/L (ref 3.5–5.2)
Sodium: 138 mmol/L (ref 134–144)
eGFR: 106 mL/min/{1.73_m2} (ref >59)

## 2021-05-12 LAB — HEPATIC FUNCTION PANEL
ALT: 28 IU/L (ref 0–32)
AST: 28 IU/L (ref 0–40)
Albumin: 4.7 g/dL (ref 3.8–4.9)
Alkaline Phosphatase: 70 IU/L (ref 44–121)
Bilirubin Total: 0.3 mg/dL (ref 0.0–1.2)
Bilirubin, Direct: 0.11 mg/dL (ref 0.00–0.40)
Total Protein: 7 g/dL (ref 6.0–8.5)

## 2021-05-12 LAB — HEMOGLOBIN A1C
Est. average glucose Bld gHb Est-mCnc: 200 mg/dL
Hgb A1c MFr Bld: 8.6 % — ABNORMAL HIGH (ref 4.8–5.6)

## 2021-05-12 LAB — MICROALBUMIN / CREATININE URINE RATIO
Creatinine, Urine: 163.3 mg/dL
Microalb/Creat Ratio: 6 mg/g creat (ref 0–29)
Microalbumin, Urine: 10 ug/mL

## 2021-05-18 ENCOUNTER — Other Ambulatory Visit (HOSPITAL_COMMUNITY): Payer: Self-pay

## 2021-05-18 MED ORDER — METFORMIN HCL 500 MG PO TABS
ORAL_TABLET | ORAL | 0 refills | Status: DC
  Filled 2021-05-18: qty 270, fill #0
  Filled 2021-06-01: qty 270, 90d supply, fill #0

## 2021-05-19 ENCOUNTER — Ambulatory Visit (INDEPENDENT_AMBULATORY_CARE_PROVIDER_SITE_OTHER): Payer: 59 | Admitting: Nurse Practitioner

## 2021-05-19 ENCOUNTER — Encounter: Payer: Self-pay | Admitting: Nurse Practitioner

## 2021-05-19 ENCOUNTER — Other Ambulatory Visit: Payer: Self-pay

## 2021-05-19 VITALS — BP 134/80 | HR 80 | Temp 98.4°F | Ht 64.0 in | Wt 203.8 lb

## 2021-05-19 DIAGNOSIS — R1310 Dysphagia, unspecified: Secondary | ICD-10-CM

## 2021-05-19 DIAGNOSIS — K2970 Gastritis, unspecified, without bleeding: Secondary | ICD-10-CM

## 2021-05-19 NOTE — Progress Notes (Addendum)
° °  Subjective:    Patient ID: Amy Dean, female    DOB: 01-Feb-1966, 56 y.o.   MRN: [id number]  HPI Patient here to for trouble swallowing pills and "heavier" foods x1 month. Patient has  felt this same sensation almost 12 years ago. Patient state that she has not had any trouble since then until about a month ago. Patient describes her difficulty swallowing as a lump getting stuck in her throat and does not move. Patient had tried drinking water and eating soft foods to try and help push the foods/pills down which does not help. Patient also takes her pills one by one however that does not seem to help either. Patient denies N/V/D, dry mouth, black stools, abdominal pain, choking, chest pain, weight loss.   Patient does has history of GERD and takes protonix PRN.    Review of Systems  HENT:  Positive for trouble swallowing.   All other systems reviewed and are negative.     Objective:   Physical Exam Constitutional:      General: She is not in acute distress.    Appearance: Normal appearance. She is obese. She is not ill-appearing or toxic-appearing.  HENT:     Head: Normocephalic and atraumatic.  Cardiovascular:     Rate and Rhythm: Normal rate and regular rhythm.     Pulses: Normal pulses.     Heart sounds: Normal heart sounds. No murmur heard. Pulmonary:     Effort: Pulmonary effort is normal. No respiratory distress.     Breath sounds: Normal breath sounds. No wheezing.  Abdominal:     General: Bowel sounds are normal. There is no distension or abdominal bruit.     Palpations: Abdomen is soft. There is no shifting dullness, fluid wave, hepatomegaly, splenomegaly, mass or pulsatile mass.     Tenderness: There is abdominal tenderness in the epigastric area. There is no guarding or rebound. Negative signs include McBurney's sign.     Hernia: No hernia is present.  Musculoskeletal:        General: Normal range of motion.     Cervical back: Normal range of motion.   Skin:    General: Skin is warm.  Neurological:     General: No focal deficit present.     Mental Status: She is alert and oriented to person, place, and time.  Psychiatric:        Mood and Affect: Mood normal.        Behavior: Behavior normal.          Assessment & Plan:   1. Dysphagia, unspecified type - Possible esophageal dysphagia - Ambulatory referral to Gastroenterology for endoscopy and evaluation - RTC in 2 months for follow up.  - Discussed heimlich maneuver to be used in incidences of choking - RTC or go to ED to be evaluated if frequency increases or change in symptoms.   2. Gastritis, presence of bleeding unspecified, unspecified chronicity, unspecified gastritis type - Concerns for gastritis due to tenderness on exam. - Take prescribed Protonix daily - CBC with Differential to r/o possible bleeding - Ambulatory referral to Gastroenterology' - RTC in 2 months for follow up

## 2021-05-20 LAB — CBC WITH DIFFERENTIAL/PLATELET
Basophils Absolute: 0 10*3/uL (ref 0.0–0.2)
Basos: 1 %
EOS (ABSOLUTE): 0.1 10*3/uL (ref 0.0–0.4)
Eos: 3 %
Hematocrit: 39.9 % (ref 34.0–46.6)
Hemoglobin: 13.2 g/dL (ref 11.1–15.9)
Immature Grans (Abs): 0 10*3/uL (ref 0.0–0.1)
Immature Granulocytes: 0 %
Lymphocytes Absolute: 2.3 10*3/uL (ref 0.7–3.1)
Lymphs: 41 %
MCH: 29.1 pg (ref 26.6–33.0)
MCHC: 33.1 g/dL (ref 31.5–35.7)
MCV: 88 fL (ref 79–97)
Monocytes Absolute: 0.5 10*3/uL (ref 0.1–0.9)
Monocytes: 9 %
Neutrophils Absolute: 2.6 10*3/uL (ref 1.4–7.0)
Neutrophils: 46 %
Platelets: 207 10*3/uL (ref 150–450)
RBC: 4.53 x10E6/uL (ref 3.77–5.28)
RDW: 13.8 % (ref 11.7–15.4)
WBC: 5.6 10*3/uL (ref 3.4–10.8)

## 2021-05-20 NOTE — Progress Notes (Signed)
Please notify patient of message:  Blood count reviewed. No evidence of GI bleed at this time. Your referral for GI has already been place. You should hear from someone by next week if they have not already contacted you. Let us know if you develop any new symptoms.   Amy Dean

## 2021-05-25 ENCOUNTER — Encounter (INDEPENDENT_AMBULATORY_CARE_PROVIDER_SITE_OTHER): Payer: Self-pay | Admitting: *Deleted

## 2021-06-01 ENCOUNTER — Other Ambulatory Visit (HOSPITAL_COMMUNITY): Payer: Self-pay

## 2021-06-14 ENCOUNTER — Other Ambulatory Visit (HOSPITAL_COMMUNITY): Payer: Self-pay

## 2021-06-14 ENCOUNTER — Other Ambulatory Visit: Payer: Self-pay | Admitting: Family Medicine

## 2021-06-14 MED ORDER — ALPRAZOLAM 0.5 MG PO TABS
0.5000 mg | ORAL_TABLET | Freq: Every evening | ORAL | 5 refills | Status: DC
  Filled 2021-06-14: qty 30, 30d supply, fill #0
  Filled 2021-07-31: qty 30, 30d supply, fill #1
  Filled 2021-09-06: qty 30, 30d supply, fill #2
  Filled 2021-10-11: qty 30, 30d supply, fill #3
  Filled 2021-11-21: qty 30, 30d supply, fill #4

## 2021-06-24 ENCOUNTER — Other Ambulatory Visit: Payer: Self-pay | Admitting: Family Medicine

## 2021-06-24 ENCOUNTER — Other Ambulatory Visit (HOSPITAL_COMMUNITY): Payer: Self-pay

## 2021-06-24 MED ORDER — LISINOPRIL-HYDROCHLOROTHIAZIDE 20-12.5 MG PO TABS
1.0000 | ORAL_TABLET | Freq: Every day | ORAL | 1 refills | Status: DC
  Filled 2021-06-24: qty 90, 90d supply, fill #0
  Filled 2021-10-11: qty 90, 90d supply, fill #1

## 2021-06-24 MED ORDER — ESTRADIOL 0.5 MG PO TABS
0.5000 mg | ORAL_TABLET | Freq: Every day | ORAL | 1 refills | Status: DC
  Filled 2021-06-24: qty 90, 90d supply, fill #0

## 2021-06-24 MED ORDER — AMLODIPINE BESYLATE 10 MG PO TABS
10.0000 mg | ORAL_TABLET | Freq: Every day | ORAL | 1 refills | Status: DC
  Filled 2021-06-24: qty 90, 90d supply, fill #0
  Filled 2021-10-11: qty 90, 90d supply, fill #1

## 2021-07-11 ENCOUNTER — Other Ambulatory Visit (HOSPITAL_COMMUNITY): Payer: Self-pay

## 2021-07-11 ENCOUNTER — Other Ambulatory Visit: Payer: Self-pay | Admitting: Family Medicine

## 2021-07-11 ENCOUNTER — Other Ambulatory Visit: Payer: Self-pay

## 2021-07-11 MED ORDER — VICTOZA 18 MG/3ML ~~LOC~~ SOPN
1.2000 mg | PEN_INJECTOR | Freq: Every day | SUBCUTANEOUS | 1 refills | Status: DC
  Filled 2021-07-11: qty 6, 30d supply, fill #0

## 2021-07-17 ENCOUNTER — Emergency Department (HOSPITAL_COMMUNITY): Payer: 59

## 2021-07-17 ENCOUNTER — Other Ambulatory Visit: Payer: Self-pay

## 2021-07-17 ENCOUNTER — Encounter (HOSPITAL_COMMUNITY): Payer: Self-pay | Admitting: Emergency Medicine

## 2021-07-17 ENCOUNTER — Emergency Department (HOSPITAL_COMMUNITY)
Admission: EM | Admit: 2021-07-17 | Discharge: 2021-07-17 | Disposition: A | Discharge status: Home / Self Care | Payer: 59 | Attending: Emergency Medicine | Admitting: Emergency Medicine

## 2021-07-17 DIAGNOSIS — E119 Type 2 diabetes mellitus without complications: Secondary | ICD-10-CM | POA: Insufficient documentation

## 2021-07-17 DIAGNOSIS — Z7984 Long term (current) use of oral hypoglycemic drugs: Secondary | ICD-10-CM | POA: Diagnosis not present

## 2021-07-17 DIAGNOSIS — I10 Essential (primary) hypertension: Secondary | ICD-10-CM | POA: Insufficient documentation

## 2021-07-17 DIAGNOSIS — R079 Chest pain, unspecified: Secondary | ICD-10-CM

## 2021-07-17 DIAGNOSIS — R0789 Other chest pain: Secondary | ICD-10-CM | POA: Diagnosis not present

## 2021-07-17 DIAGNOSIS — Z7982 Long term (current) use of aspirin: Secondary | ICD-10-CM | POA: Insufficient documentation

## 2021-07-17 DIAGNOSIS — I471 Supraventricular tachycardia: Secondary | ICD-10-CM | POA: Diagnosis not present

## 2021-07-17 DIAGNOSIS — R072 Precordial pain: Secondary | ICD-10-CM | POA: Diagnosis present

## 2021-07-17 DIAGNOSIS — R0602 Shortness of breath: Secondary | ICD-10-CM | POA: Diagnosis not present

## 2021-07-17 DIAGNOSIS — Z79899 Other long term (current) drug therapy: Secondary | ICD-10-CM | POA: Diagnosis not present

## 2021-07-17 DIAGNOSIS — Z794 Long term (current) use of insulin: Secondary | ICD-10-CM | POA: Diagnosis not present

## 2021-07-17 DIAGNOSIS — I517 Cardiomegaly: Secondary | ICD-10-CM | POA: Diagnosis not present

## 2021-07-17 LAB — CBC
HCT: 41.7 % (ref 36.0–46.0)
Hemoglobin: 14.7 g/dL (ref 12.0–15.0)
MCH: 30.7 pg (ref 26.0–34.0)
MCHC: 35.3 g/dL (ref 30.0–36.0)
MCV: 87.1 fL (ref 80.0–100.0)
Platelets: 229 10*3/uL (ref 150–400)
RBC: 4.79 MIL/uL (ref 3.87–5.11)
RDW: 13.3 % (ref 11.5–15.5)
WBC: 6.5 10*3/uL (ref 4.0–10.5)
nRBC: 0 % (ref 0.0–0.2)

## 2021-07-17 LAB — TROPONIN I (HIGH SENSITIVITY): Troponin I (High Sensitivity): 8 ng/L (ref <18)

## 2021-07-17 LAB — BASIC METABOLIC PANEL
Anion gap: 12 (ref 5–15)
BUN: 12 mg/dL (ref 6–20)
CO2: 18 mmol/L — ABNORMAL LOW (ref 22–32)
Calcium: 9.6 mg/dL (ref 8.9–10.3)
Chloride: 106 mmol/L (ref 98–111)
Creatinine, Ser: 0.8 mg/dL (ref 0.44–1.00)
GFR, Estimated: >60 mL/min (ref >60)
Glucose, Bld: 137 mg/dL — ABNORMAL HIGH (ref 70–99)
Potassium: 3.9 mmol/L (ref 3.5–5.1)
Sodium: 136 mmol/L (ref 135–145)

## 2021-07-17 LAB — MAGNESIUM: Magnesium: 1.7 mg/dL (ref 1.7–2.4)

## 2021-07-17 LAB — TSH: TSH: 1.002 u[IU]/mL (ref 0.350–4.500)

## 2021-07-17 MED ORDER — SODIUM CHLORIDE 0.9 % IV BOLUS
1000.0000 mL | Freq: Once | INTRAVENOUS | Status: AC
  Administered 2021-07-17: 1000 mL via INTRAVENOUS

## 2021-07-17 MED ORDER — ADENOSINE 6 MG/2ML IV SOLN
INTRAVENOUS | Status: AC
  Administered 2021-07-17: 6 mg via INTRAVENOUS
  Filled 2021-07-17: qty 2

## 2021-07-17 MED ORDER — ADENOSINE 6 MG/2ML IV SOLN: 6.0000 mg | Freq: Once | INTRAVENOUS | Status: AC

## 2021-07-17 NOTE — ED Provider Notes (Signed)
[facility] EMERGENCY DEPARTMENT Provider Note   CSN: [id number] Arrival date & time: 07/17/21  1648     History  Chief Complaint  Patient presents with   Chest Pain   Shortness of Breath    Amy Dean is a 56 y.o. female.  She has a history of diabetes and hypertension.  Complaining of acute onset of shortness of breath and some chest discomfort that started about 2 hours ago while she was making lunch.  No prior history of same.  No tobacco or stimulants.  No prior arrhythmias.  The history is provided by the patient.  Chest Pain Pain location:  Substernal area Pain quality: pressure   Pain radiates to:  Does not radiate Pain severity:  Moderate Onset quality:  Sudden Duration:  2 hours Timing:  Constant Progression:  Unchanged Chronicity:  New Relieved by:  Nothing Worsened by:  Exertion Ineffective treatments:  None tried Associated symptoms: shortness of breath   Associated symptoms: no abdominal pain, no cough, no diaphoresis, no fever, no headache, no nausea and no vomiting   Shortness of Breath Associated symptoms: chest pain   Associated symptoms: no abdominal pain, no cough, no diaphoresis, no fever, no headaches, no neck pain, no rash, no sore throat and no vomiting       Home Medications Prior to Admission medications   Medication Sig Start Date End Date Taking? Authorizing Provider  ALPRAZolam Duanne Moron) 0.5 MG tablet Take 1 tablet (0.5 mg total) by mouth at bedtime as needed for sleep. 06/14/21   Kathyrn Drown, MD  amLODipine (NORVASC) 10 MG tablet Take 1 tablet (10 mg total) by mouth daily. 06/24/21   Kathyrn Drown, MD  aspirin 81 MG tablet Take 81 mg by mouth daily.    [provider]  chlorzoxazone (PARAFON) 500 MG tablet Take one tablet po q 8 hrs prn. HOME USE ONLY Patient not taking: Reported on 05/19/2021 12/27/20   Kathyrn Drown, MD  cholecalciferol (VITAMIN D) 1000 units tablet Take 1,000 Units by mouth daily.    [provider]  estradiol (ESTRACE) 0.5 MG tablet Take 1 tablet (0.5 mg total) by mouth daily. 06/24/21   Kathyrn Drown, MD  etodolac (LODINE) 400 MG tablet Take one tablet po BID prn Patient not taking: Reported on 05/19/2021 12/27/20   Kathyrn Drown, MD  hydrALAZINE (APRESOLINE) 25 MG tablet Take 1 tablet (25 mg total) by mouth 2 (two) times daily. 08/23/20   Kathyrn Drown, MD  HYDROcodone-acetaminophen (NORCO) 5-325 MG tablet Take 1 tablet by mouth every 4 (four) hours as needed for moderate pain. 01/04/21   Aviva Signs, MD  Insulin Pen Needle (PEN NEEDLES 31GX5/16") 31G X 8 MM MISC Use daily as directed 07/16/13   Nilda Simmer, NP  liraglutide (VICTOZA) 18 MG/3ML SOPN Inject 1.2 mg into the skin daily. 07/11/21   Kathyrn Drown, MD  lisinopril-hydrochlorothiazide (ZESTORETIC) 20-12.5 MG tablet Take 1 tablet by mouth daily for blood pressure. 06/24/21   Kathyrn Drown, MD  metFORMIN (GLUCOPHAGE) 500 MG tablet Take 2  tablets by mouth in the morning and 1 tablet by mouth in the evening 05/18/21   Kathyrn Drown, MD  MODERNA COVID-19 BIVAL BOOSTER 50 MCG/0.5ML injection  03/04/21   [provider]  Omega-3 Fatty Acids (FISH OIL) 1000 MG CAPS Take 1 capsule by mouth daily.    [provider]  pantoprazole (PROTONIX) 40 MG tablet Take 1 tablet (40 mg total) by  mouth daily for acid reflux. 08/10/20   Kathyrn Drown, MD  predniSONE (DELTASONE) 20 MG tablet Take 2 tablets (40 mg total) by mouth daily. 01/12/21   Vanessa Kick, MD  progesterone (PROMETRIUM) 100 MG capsule Take 1 capsule (100 mg total) by mouth daily. 12/03/20   Kathyrn Drown, MD  rosuvastatin (CRESTOR) 40 MG tablet Take 1 tablet (40 mg total) by mouth daily. 12/03/20   Kathyrn Drown, MD  triamcinolone cream (KENALOG) 0.1 % Apply a thin amount to finger as needed when flare up occurs 08/10/20   Kathyrn Drown, MD      Allergies    Patient has no known allergies.    Review of Systems   Review of Systems   Constitutional:  Negative for diaphoresis and fever.  HENT:  Negative for sore throat.   Eyes:  Negative for visual disturbance.  Respiratory:  Positive for shortness of breath. Negative for cough.   Cardiovascular:  Positive for chest pain.  Gastrointestinal:  Negative for abdominal pain, nausea and vomiting.  Genitourinary:  Negative for dysuria.  Musculoskeletal:  Negative for neck pain.  Skin:  Negative for rash.  Neurological:  Negative for headaches.   Physical Exam Updated Vital Signs BP (!) 148/77    Pulse (!) 154    Temp 98.2 F (36.8 C)    Resp (!) 25    Ht '5\' 2"'$  (1.575 m)    Wt 88.9 kg    LMP 02/10/2011    SpO2 98%    BMI 35.85 kg/m  Physical Exam Vitals and nursing note reviewed.  Constitutional:      General: She is not in acute distress.    Appearance: She is well-developed.  HENT:     Head: Normocephalic and atraumatic.  Eyes:     Conjunctiva/sclera: Conjunctivae normal.  Cardiovascular:     Rate and Rhythm: Regular rhythm. Tachycardia present.     Heart sounds: Normal heart sounds. No murmur heard. Pulmonary:     Effort: Pulmonary effort is normal. Tachypnea present. No respiratory distress.     Breath sounds: Normal breath sounds.  Abdominal:     Palpations: Abdomen is soft.     Tenderness: There is no abdominal tenderness.  Musculoskeletal:        General: No swelling. Normal range of motion.     Cervical back: Neck supple.     Right lower leg: No tenderness. No edema.     Left lower leg: No tenderness. No edema.  Skin:    General: Skin is warm and dry.     Capillary Refill: Capillary refill takes less than 2 seconds.  Neurological:     General: No focal deficit present.     Mental Status: She is alert.    ED Results / Procedures / Treatments   Labs (all labs ordered are listed, but only abnormal results are displayed) Labs Reviewed  BASIC METABOLIC PANEL - Abnormal; Notable for the following components:      Result Value   CO2 18 (*)     Glucose, Bld 137 (*)    All other components within normal limits  CBC  MAGNESIUM  TSH  TROPONIN I (HIGH SENSITIVITY)    EKG EKG Interpretation  Date/Time:  Sunday July 17 2021 16:57:49 EDT Ventricular Rate:  142 PR Interval:  60 QRS Duration: 109 QT Interval:  303 QTC Calculation: 466 R Axis:   -44 Text Interpretation: SVT vs Aflutter Incomplete RBBB and LAFB LVH with secondary repolarization abnormality  Anterior Q waves, possibly due to LVH Baseline wander in lead(s) III aVL new from prior 5/18 Confirmed by Aletta Edouard [id number]) on 07/17/2021 5:07:07 PM  Radiology DG Chest Port 1 View  Result Date: 07/17/2021 CLINICAL DATA:  Chest pain and shortness of breath beginning 2 hours ago. Diabetes and hypertension. EXAM: PORTABLE CHEST 1 VIEW COMPARISON:  05/12/2017 FINDINGS: Numerous leads and wires project over the chest. External pacer/defibrillator projects over the lower chest. Midline trachea. Borderline cardiomegaly. Mediastinal contours otherwise within normal limits. No pleural effusion or pneumothorax. No congestive failure. Clear lungs. IMPRESSION: Borderline cardiomegaly, without acute disease. Electronically Signed   By: Abigail Miyamoto M.D.   On: 07/17/2021 17:52    Procedures .Cardioversion  Date/Time: 07/18/2021 10:47 AM Performed by: Hayden Rasmussen, MD Authorized by: Hayden Rasmussen, MD   Consent:    Consent obtained:  Verbal   Consent given by:  Patient   Risks discussed:  Pain   Alternatives discussed:  No treatment, delayed treatment, rate-control medication and observation Pre-procedure details:    Cardioversion basis:  Elective   Rhythm:  Supraventricular tachycardia   Electrode placement:  Anterior-posterior Patient sedated: No Post-procedure details:    Patient status:  Awake   Patient tolerance of procedure:  Tolerated well, no immediate complications Comments:     Patient was, chemically cardioverted with 6 mg of adenosine.  Patient tolerated  procedure well  .Critical Care Performed by: Hayden Rasmussen, MD Authorized by: Hayden Rasmussen, MD   Critical care provider statement:    Critical care time (minutes):  30   Critical care time was exclusive of:  Separately billable procedures and treating other patients   Critical care was necessary to treat or prevent imminent or life-threatening deterioration of the following conditions:  Cardiac failure   Critical care was time spent personally by me on the following activities:  Development of treatment plan with patient or surrogate, discussions with consultants, evaluation of patient's response to treatment, examination of patient, obtaining history from patient or surrogate, ordering and performing treatments and interventions, ordering and review of laboratory studies, ordering and review of radiographic studies, pulse oximetry, re-evaluation of patient's condition and review of old charts   I assumed direction of critical care for this patient from another provider in my specialty: no      Medications Ordered in ED Medications  adenosine (ADENOCARD) 6 MG/2ML injection 6 mg (6 mg Intravenous Given 07/17/21 1720)  sodium chloride 0.9 % bolus 1,000 mL (0 mLs Intravenous Stopped 07/17/21 1925)    ED Course/ Medical Decision Making/ A&P Clinical Course as of 07/18/21 1046  Sun Jul 17, 2021  1709 Initial EKG showing a rapid tachycardia with a rate of 142.  Tried a vagal maneuver without any change in rhythm. [MB]  1722 Adenosine seen 6 mg given with return of sinus rhythm. [MB]  6045 Chest x-ray interpreted by me as no acute pulmonary disease.  Awaiting radiology reading. [MB]    Clinical Course User Index [MB] Hayden Rasmussen, MD                           Medical Decision Making Amount and/or Complexity of Data Reviewed Labs: ordered. Radiology: ordered.  Risk Prescription drug management.  This patient complains of chest pain shortness of breath; this involves an  extensive number of treatment Options and is a complaint that carries with it a high risk of complications and morbidity. The differential includes  ACS, pneumonia, PE, pneumothorax, anemia, metabolic derangement, arrhythmia  I ordered, reviewed and interpreted labs, which included CBC with normal white count normal hemoglobin, chemistries normal other than low bicarb reflecting some dehydration troponins flat, TSH normal, magnesium low normal I ordered medication IV fluids IV adenosine and reviewed PMP when indicated. I ordered imaging studies which included chest x-ray and I independently    visualized and interpreted imaging which showed borderline cardiomegaly Additional history obtained from patient's family members Previous records obtained and reviewed in epic no recent admissions Cardiac monitoring reviewed, patient in SVT, converted to normal sinus rhythm with adenosine Social determinants considered, no significant barriers Critical Interventions: Evaluation and management of patient's arrhythmia  After the interventions stated above, I reevaluated the patient and found patient to be symptomatically improved Admission and further testing considered, patient feels back to baseline.  No indications for admission at this time.  Given cardiology contact information.  Return instructions discussed.          Final Clinical Impression(s) / ED Diagnoses Final diagnoses:  Atypical chest pain  SVT (supraventricular tachycardia) ([place])    Rx / DC Orders ED Discharge Orders     None         Hayden Rasmussen, MD 07/18/21 1050

## 2021-07-17 NOTE — Discharge Instructions (Signed)
You are seen in the emergency department for shortness of breath and chest tightness.  Your EKG showed you to be in a fast rhythm called SVT.  You were given medication with improvement in your symptoms and your heart rate.  Please keep well-hydrated.  Avoid caffeine.  Follow-up with your primary care doctor and also cardiology.  Return to the emergency department if any worsening or concerning symptoms ?

## 2021-07-17 NOTE — ED Triage Notes (Signed)
Pt presents today c/o chest pain and SHOB that started about 2 hours ago. Denies previous episodes like this. Denies any other sx. ?

## 2021-07-18 ENCOUNTER — Ambulatory Visit: Payer: 59 | Admitting: Nurse Practitioner

## 2021-07-18 ENCOUNTER — Ambulatory Visit (INDEPENDENT_AMBULATORY_CARE_PROVIDER_SITE_OTHER): Payer: 59 | Admitting: Gastroenterology

## 2021-07-18 ENCOUNTER — Encounter: Payer: Self-pay | Admitting: Nurse Practitioner

## 2021-07-18 ENCOUNTER — Encounter (INDEPENDENT_AMBULATORY_CARE_PROVIDER_SITE_OTHER): Payer: Self-pay | Admitting: Gastroenterology

## 2021-07-18 VITALS — BP 129/81 | HR 79 | Temp 99.4°F | Ht 62.0 in | Wt 199.0 lb

## 2021-07-18 VITALS — BP 142/81 | HR 82 | Temp 98.7°F | Ht 62.0 in | Wt 198.3 lb

## 2021-07-18 DIAGNOSIS — R0602 Shortness of breath: Secondary | ICD-10-CM

## 2021-07-18 DIAGNOSIS — R1310 Dysphagia, unspecified: Secondary | ICD-10-CM

## 2021-07-18 DIAGNOSIS — E1169 Type 2 diabetes mellitus with other specified complication: Secondary | ICD-10-CM

## 2021-07-18 DIAGNOSIS — R079 Chest pain, unspecified: Secondary | ICD-10-CM

## 2021-07-18 DIAGNOSIS — E785 Hyperlipidemia, unspecified: Secondary | ICD-10-CM

## 2021-07-18 DIAGNOSIS — E119 Type 2 diabetes mellitus without complications: Secondary | ICD-10-CM

## 2021-07-18 DIAGNOSIS — I517 Cardiomegaly: Secondary | ICD-10-CM | POA: Insufficient documentation

## 2021-07-18 DIAGNOSIS — K219 Gastro-esophageal reflux disease without esophagitis: Secondary | ICD-10-CM

## 2021-07-18 NOTE — Progress Notes (Signed)
? ?Subjective:  ? ? Patient ID: Amy Dean, female    DOB: [birth year], 56 y.o.   MRN: [id number] ? ?HPI ? ?56 year old patient here today for follow-up on dysphagia and follow-up to ER visit for chest pain.   ? ?Patient states that she has not had any more issues with dysphagia since our last visit.  Patient is scheduled to see GI today at 11:00 AM.   ? ?Regarding chest pain patient states that she was seen in the emergency room yesterday with complaints of shortness of breath, tachycardia, and chest pain.  Patient states that she was not doing anything strenuous at the time of her symptoms and symptoms seem to come on without provocation.  Labs in the ED were negative to include a negative troponin.  Patient was instructed to follow-up with cardiology.   ? ?Currently patient denies any shortness of breath, difficulty breathing, chest pain, tachycardia, palpitations, swelling to lower extremities. ? ?Review of Systems  ?All other systems reviewed and are negative. ? ?   ?Objective:  ? Physical Exam ?Constitutional:   ?   General: She is not in acute distress. ?   Appearance: Normal appearance. She is obese. She is not ill-appearing, toxic-appearing or diaphoretic.  ?Neck:  ?   Vascular: No carotid bruit.  ?Cardiovascular:  ?   Rate and Rhythm: Normal rate and regular rhythm.  ?   Pulses: Normal pulses.  ?   Heart sounds: Normal heart sounds. No murmur heard. ?Pulmonary:  ?   Effort: Pulmonary effort is normal. No respiratory distress.  ?   Breath sounds: Normal breath sounds. No stridor. No wheezing, rhonchi or rales.  ?Chest:  ?   Chest wall: No tenderness.  ?Musculoskeletal:     ?   General: Normal range of motion.  ?   Cervical back: Normal range of motion and neck supple. No rigidity or tenderness.  ?Lymphadenopathy:  ?   Cervical: No cervical adenopathy.  ?Skin: ?   General: Skin is warm.  ?   Capillary Refill: Capillary refill takes less than 2 seconds.  ?Neurological:  ?   General: No focal deficit  present.  ?   Mental Status: She is alert and oriented to person, place, and time.  ?Psychiatric:     ?   Mood and Affect: Mood normal.     ?   Behavior: Behavior normal.  ? ? ? ? ? ?   ?Assessment & Plan:  ?1. Dysphagia, unspecified type ?-Seems to be stable at this time. ?-Follow-up with gastroenterology as scheduled. ?-Follow-up in 4 months ? ?2. Chest pain, unspecified type ?-Most recent EKG and previous EKG reviewed.  Patient noted to be tachycardic during yesterday's EKG. left ventricular hypertrophy noted during yesterday's EKG in her previous EKG. ?-Echo in 2020 also noted left ventricular hypertrophy ?-Possible development of congestive heart failure. ?-BNP ordered ?- Ambulatory referral to Cardiology ?-Go to emergency room if you experience shortness of breath or chest pain again. ?-Return to clinic in 4 months ? ?3. Hyperlipidemia associated with type 2 diabetes mellitus ([place]) ?-Last LDL was 57.  Goal LDL 70 met. ?- Lipid Panel With LDL/HDL Ratio ? ?4. Controlled type 2 diabetes mellitus without complication, without long-term current use of insulin ([place]) ?-Last A1c 8.6.  Goal A1c less than 7 not met. ?-Patient's metformin was increased on last visit. ?-We will repeat A1c today ?- HgB A1c ?- CMP14+EGFR ? ?  ?Note:  This document was prepared using Systems analyst and  may include unintentional dictation errors. ? ?

## 2021-07-18 NOTE — Progress Notes (Cosign Needed)
Referring Provider: Ameduite, Trenton Gammon, NP Primary Care Physician:  Kathyrn Drown, MD Primary GI Physician: new  Chief Complaint  Patient presents with   Dysphagia    Meds and foods get stuck. Sometimes feels like it is there all day. Has some gastritis.    HPI:   Amy Dean is a 56 y.o. female with past medical history of anemia, enteritis, HTN, HLD, DM type 2.  Patient presenting today as a new patient for dysphagia.  She states that for the past 3-4 months, food and pills feel that they stick just above her sternal notch. She states that typically after a few hours the sensation will resolve as it feels that food/pill will pass. She notes a lump feeling in her throat when this occurs. Denies any issues with liquids. States that usually dryer/thicker foods like meat and larger pills get stuck. Sometimes feels that she may need to try and cough the food/pills back up but has not had to do this yet as it usually passes down on its own. No odynophagia. She denies any abdominal pain. She does have issues with acid reflux, maybe once per week notes some heartburn, she states that she has protonix and only takes this as needed, has not tried taking on regularly scheduled dosing. She denies any nausea or vomiting, constipation, diarrhea, rectal bleeding or melena. Denies any weight loss. States that she has started eating a little less due to dysphagia as she is avoiding certain foods. Has some occasional bloating but usually just dependent on what she eats.   NSAID use: no nsaids Social hx: maybe 4 beers per week, no tobacco Fam hx: no CRC or liver disease  Last Colonoscopy:03/04/15 Examination performed to cecum.  All internal hemorrhoids otherwise normal colonoscopy. Last Endoscopy:maybe in distant past  Recommendations:  REPEAT COLONOSCOPY OCT 2026  Past Medical History:  Diagnosis Date   Anemia    Resolved post hysterectomy   Enteritis    Essential hypertension     Headache    Hyperlipidemia    Type 2 diabetes mellitus [facility])     Past Surgical History:  Procedure Laterality Date   ABDOMINAL HYSTERECTOMY     CESAREAN SECTION  1982 and 1989   x2   COLONOSCOPY N/A 04/22/2015   Procedure: COLONOSCOPY;  Surgeon: Rogene Houston, MD;  [facility];  Service: Endoscopy;  Laterality: N/A;  1:00   EUS  10/26/2011   Procedure: UPPER ENDOSCOPIC ULTRASOUND (EUS) LINEAR;  Surgeon: Milus Banister, MD;  [facility];  Service: Endoscopy;  Laterality: N/A;   HYSTEROSCOPY W/ ENDOMETRIAL ABLATION  07/22/2008   K.Harrington Challenger MD [facility]   LAPAROSCOPIC APPENDECTOMY N/A 01/03/2021   Procedure: APPENDECTOMY LAPAROSCOPIC;  Surgeon: Aviva Signs, MD;  Location: [facility] ORS;  Service: General;  Laterality: N/A;   SUPRACERVICAL ABDOMINAL HYSTERECTOMY  03/21/2011   Procedure: HYSTERECTOMY SUPRACERVICAL ABDOMINAL;  Surgeon: Jonnie Kind, MD;  Location: [facility] ORS;  Service: Gynecology;;   TONSILLECTOMY     TUBAL LIGATION      Current Outpatient Medications  Medication Sig Dispense Refill   ALPRAZolam (XANAX) 0.5 MG tablet Take 1 tablet (0.5 mg total) by mouth at bedtime as needed for sleep. 30 tablet 5   amLODipine (NORVASC) 10 MG tablet Take 1 tablet (10 mg total) by mouth daily. 90 tablet 1   aspirin 81 MG tablet Take 81 mg by mouth daily.     hydrALAZINE (APRESOLINE) 25 MG tablet Take 1 tablet (25 mg total)  by mouth 2 (two) times daily. (Patient taking differently: Take 25 mg by mouth daily.) 60 tablet 2   Insulin Pen Needle (PEN NEEDLES 31GX5/16") 31G X 8 MM MISC Use daily as directed 100 each 11   liraglutide (VICTOZA) 18 MG/3ML SOPN Inject 1.2 mg into the skin daily. 6 mL 1   lisinopril-hydrochlorothiazide (ZESTORETIC) 20-12.5 MG tablet Take 1 tablet by mouth daily for blood pressure. 90 tablet 1   metFORMIN (GLUCOPHAGE) 500 MG tablet Take 2  tablets by mouth in the morning and 1 tablet by mouth in the evening (Patient taking differently: Take 1,000 mg by mouth  daily with breakfast.) 270 tablet 0   pantoprazole (PROTONIX) 40 MG tablet Take 1 tablet (40 mg total) by mouth daily for acid reflux. 90 tablet 1   rosuvastatin (CRESTOR) 40 MG tablet Take 1 tablet (40 mg total) by mouth daily. 90 tablet 1   triamcinolone cream (KENALOG) 0.1 % Apply a thin amount to finger as needed when flare up occurs 45 g 1   No current facility-administered medications for this visit.    Allergies as of 07/18/2021   (No Known Allergies)    Family History  Problem Relation Age of Onset   Healthy Son    Sarcoidosis Mother    Heart attack Mother    Stroke Mother    Other Father        unsure of history   Heart attack Paternal Uncle    Hypertension Maternal Grandmother    Anesthesia problems Neg Hx    Hypotension Neg Hx    Malignant hyperthermia Neg Hx    Pseudochol deficiency Neg Hx     Social History   Socioeconomic History   Marital status: Married    Spouse name: Berneta, Sconyers.   Number of children: 1   Years of education: 12th grade   Highest education level: Not on file  Occupational History   Occupation: CSPDT    Employer: [employer]  Tobacco Use   Smoking status: Never    Passive exposure: Never   Smokeless tobacco: Never  Vaping Use   Vaping Use: Never used  Substance and Sexual Activity   Alcohol use: Yes    Alcohol/week: 4.0 standard drinks    Types: 4 Cans of beer per week   Drug use: No   Sexual activity: Yes    Birth control/protection: Surgical  Other Topics Concern   Not on file  Social History Narrative   Lives with her husband,married for 33 years.   Right-handed.   Occasional soda (1-2 times per week).   Works at [employer].   Social Determinants of Health   Financial Resource Strain: Not on file  Food Insecurity: Not on file  Transportation Needs: Not on file  Physical Activity: Not on file  Stress: Not on file  Social Connections: Not on file   Review of systems General: negative  for malaise, night sweats, fever, chills, weight loss Neck: Negative for lumps, goiter, pain and significant neck swelling Resp: Negative for cough, wheezing, dyspnea at rest CV: Negative for chest pain, leg swelling, palpitations, orthopnea GI: denies melena, hematochezia, nausea, vomiting, diarrhea, constipation, odyonophagia, early satiety or unintentional weight loss. +dysphagia MSK: Negative for joint pain or swelling, back pain, and muscle pain. Derm: Negative for itching or rash Psych: Denies depression, anxiety, memory loss, confusion. No homicidal or suicidal ideation.  Heme: Negative for prolonged bleeding, bruising easily, and swollen nodes. Endocrine: Negative for cold or heat  intolerance, polyuria, polydipsia and goiter. Neuro: negative for tremor, gait imbalance, syncope and seizures. The remainder of the review of systems is noncontributory.  Physical Exam: BP (!) 142/81 (BP Location: Right Arm, Patient Position: Sitting, Cuff Size: Large)    Pulse 82    Temp 98.7 F (37.1 C) (Oral)    Ht '5\' 2"'$  (1.575 m)    Wt 198 lb 4.8 oz (89.9 kg)    LMP 02/10/2011    BMI 36.27 kg/m  General:   Alert and oriented. No distress noted. Pleasant and cooperative.  Head:  Normocephalic and atraumatic. Eyes:  Conjuctiva clear without scleral icterus. Mouth:  Oral mucosa pink and moist. Good dentition. No lesions. Heart: Normal rate and rhythm, s1 and s2 heart sounds present.  Lungs: Clear lung sounds in all lobes. Respirations equal and unlabored. Abdomen:  +BS, soft, non-tender and non-distended. No rebound or guarding. No HSM or masses noted. Derm: No palmar erythema or jaundice Msk:  Symmetrical without gross deformities. Normal posture. Extremities:  Without edema. Neurologic:  Alert and  oriented x4 Psych:  Alert and cooperative. Normal mood and affect.  Invalid input(s): 6 MONTHS   ASSESSMENT: Amy Dean is a 56 y.o. female presenting today as a new patient for 3-4 month  of dysphagia, mostly with thicker/dryer foods and larger pills.  History of GERD, prescribed protonix '40mg'$  once daily, though tells me she only takes this as needed when she has acid reflux symptoms, maybe once per week.  Feels that food and larger pills stick just above sternal notch, usually substance will pass on down after a few hours. I discussed the goal of PPI is to help prevent reflux in order to avoid damage to the esophagus/GI tract. She should try taking this on regularly scheduled dosing, can try every other day to every 2 days, whichever controls her reflux symptoms. We will get her scheduled for EGD for further evaluation of her dysphagia as we cannot rule out esophageal stenosis/stricture or dysphagia secondary to uncontrolled GERD, less likely malignancy given lack of alarm symptoms. Indications, risks and benefits of procedure discussed in detail with patient. Patient verbalized understanding and is in agreement to proceed with EGD at this time.    PLAN:  Schedule EGD  2. Take protonix on scheduled dosing, every other day to every 2 days depending on symptom control 3. Reflux precautions, stay upright 2-3 hours after eating, avoid greasy, spicy, tomato based foods, alcohol, caffeine and chocolate 4. Chewing precautions, avoiding dryer/thicker foods, small bites, chew thoroughly, sips of liquids between bites  All questions were answered, patient verbalized understanding and is in agreement with plan as outline above.   Follow Up: TBD after EGD  Marcelia Petersen L. Alver Sorrow, MSN, APRN, AGNP-C Adult-Gerontology Nurse Practitioner [facility]

## 2021-07-18 NOTE — H&P (View-Only) (Signed)
? ?Referring Provider: Ameduite, Trenton Gammon, NP ?Primary Care Physician:  Kathyrn Drown, MD ?Primary GI Physician: new ? ?Chief Complaint  ?Patient presents with  ? Dysphagia  ?  Meds and foods get stuck. Sometimes feels like it is there all day. Has some gastritis.   ? ?HPI:   ?Amy Dean is a 56 y.o. female with past medical history of anemia, enteritis, HTN, HLD, DM type 2. ? ?Patient presenting today as a new patient for dysphagia. ? ?She states that for the past 3-4 months, food and pills feel that they stick just above her sternal notch. She states that typically after a few hours the sensation will resolve as it feels that food/pill will pass. She notes a lump feeling in her throat when this occurs. Denies any issues with liquids. States that usually dryer/thicker foods like meat and larger pills get stuck. Sometimes feels that she may need to try and cough the food/pills back up but has not had to do this yet as it usually passes down on its own. No odynophagia. She denies any abdominal pain. She does have issues with acid reflux, maybe once per week notes some heartburn, she states that she has protonix and only takes this as needed, has not tried taking on regularly scheduled dosing. She denies any nausea or vomiting, constipation, diarrhea, rectal bleeding or melena. Denies any weight loss. States that she has started eating a little less due to dysphagia as she is avoiding certain foods. Has some occasional bloating but usually just dependent on what she eats.  ? ?NSAID use: no nsaids ?Social hx: maybe 4 beers per week, no tobacco ?Fam hx: no CRC or liver disease ? ?Last Colonoscopy:03/04/15 Examination performed to cecum. ? All internal hemorrhoids otherwise normal colonoscopy. ?Last Endoscopy:maybe in distant past ? ?Recommendations:  ?REPEAT COLONOSCOPY OCT 2026 ? ?Past Medical History:  ?Diagnosis Date  ? Anemia   ? Resolved post hysterectomy  ? Enteritis   ? Essential hypertension   ?  Headache   ? Hyperlipidemia   ? Type 2 diabetes mellitus ([place])   ? ? ?Past Surgical History:  ?Procedure Laterality Date  ? ABDOMINAL HYSTERECTOMY    ? [place]  ? x2  ? COLONOSCOPY N/A 04/22/2015  ? Procedure: COLONOSCOPY;  Surgeon: Rogene Houston, MD;  [facility];  Service: Endoscopy;  Laterality: N/A;  1:00  ? EUS  10/26/2011  ? Procedure: UPPER ENDOSCOPIC ULTRASOUND (EUS) LINEAR;  Surgeon: Milus Banister, MD;  [facility];  Service: Endoscopy;  Laterality: N/A;  ? HYSTEROSCOPY W/ ENDOMETRIAL ABLATION  07/22/2008  ? K.Harrington Challenger MD [facility]  ? LAPAROSCOPIC APPENDECTOMY N/A 01/03/2021  ? Procedure: APPENDECTOMY LAPAROSCOPIC;  Surgeon: Aviva Signs, MD;  Location: [facility];  Service: General;  Laterality: N/A;  ? SUPRACERVICAL ABDOMINAL HYSTERECTOMY  03/21/2011  ? Procedure: HYSTERECTOMY SUPRACERVICAL ABDOMINAL;  Surgeon: Jonnie Kind, MD;  Location: [facility];  Service: Gynecology;;  ? TONSILLECTOMY    ? TUBAL LIGATION    ? ? ?Current Outpatient Medications  ?Medication Sig Dispense Refill  ? ALPRAZolam (XANAX) 0.5 MG tablet Take 1 tablet (0.5 mg total) by mouth at bedtime as needed for sleep. 30 tablet 5  ? amLODipine (NORVASC) 10 MG tablet Take 1 tablet (10 mg total) by mouth daily. 90 tablet 1  ? aspirin 81 MG tablet Take 81 mg by mouth daily.    ? hydrALAZINE (APRESOLINE) 25 MG tablet Take 1 tablet (25 mg total)  by mouth 2 (two) times daily. (Patient taking differently: Take 25 mg by mouth daily.) 60 tablet 2  ? Insulin Pen Needle (PEN NEEDLES 31GX5/16") 31G X 8 MM MISC Use daily as directed 100 each 11  ? liraglutide (VICTOZA) 18 MG/3ML SOPN Inject 1.2 mg into the skin daily. 6 mL 1  ? lisinopril-hydrochlorothiazide (ZESTORETIC) 20-12.5 MG tablet Take 1 tablet by mouth daily for blood pressure. 90 tablet 1  ? metFORMIN (GLUCOPHAGE) 500 MG tablet Take 2  tablets by mouth in the morning and 1 tablet by mouth in the evening (Patient taking differently: Take 1,000 mg by mouth  daily with breakfast.) 270 tablet 0  ? pantoprazole (PROTONIX) 40 MG tablet Take 1 tablet (40 mg total) by mouth daily for acid reflux. 90 tablet 1  ? rosuvastatin (CRESTOR) 40 MG tablet Take 1 tablet (40 mg total) by mouth daily. 90 tablet 1  ? triamcinolone cream (KENALOG) 0.1 % Apply a thin amount to finger as needed when flare up occurs 45 g 1  ? ?No current facility-administered medications for this visit.  ? ? ?Allergies as of 07/18/2021  ? (No Known Allergies)  ? ? ?Family History  ?Problem Relation Age of Onset  ? Healthy Son   ? Sarcoidosis Mother   ? Heart attack Mother   ? Stroke Mother   ? Other Father   ?     unsure of history  ? Heart attack Paternal Uncle   ? Hypertension Maternal Grandmother   ? Anesthesia problems Neg Hx   ? Hypotension Neg Hx   ? Malignant hyperthermia Neg Hx   ? Pseudochol deficiency Neg Hx   ? ? ?Social History  ? ?Socioeconomic History  ? Marital status: Married  ?  Spouse name: Raneshia, Derick.  ? Number of children: 1  ? Years of education: 12th grade  ? Highest education level: Not on file  ?Occupational History  ? Occupation: CSPDT  ?  Employer: [employer]  ?Tobacco Use  ? Smoking status: Never  ?  Passive exposure: Never  ? Smokeless tobacco: Never  ?Vaping Use  ? Vaping Use: Never used  ?Substance and Sexual Activity  ? Alcohol use: Yes  ?  Alcohol/week: 4.0 standard drinks  ?  Types: 4 Cans of beer per week  ? Drug use: No  ? Sexual activity: Yes  ?  Birth control/protection: Surgical  ?Other Topics Concern  ? Not on file  ?Social History Narrative  ? Lives with her husband,married for 33 years.  ? Right-handed.  ? Occasional soda (1-2 times per week).  ? Works at [employer].  ? ?Social Determinants of Health  ? ?Financial Resource Strain: Not on file  ?Food Insecurity: Not on file  ?Transportation Needs: Not on file  ?Physical Activity: Not on file  ?Stress: Not on file  ?Social Connections: Not on file  ? ?Review of systems ?General: negative  for malaise, night sweats, fever, chills, weight loss ?Neck: Negative for lumps, goiter, pain and significant neck swelling ?Resp: Negative for cough, wheezing, dyspnea at rest ?CV: Negative for chest pain, leg swelling, palpitations, orthopnea ?GI: denies melena, hematochezia, nausea, vomiting, diarrhea, constipation, odyonophagia, early satiety or unintentional weight loss. +dysphagia ?MSK: Negative for joint pain or swelling, back pain, and muscle pain. ?Derm: Negative for itching or rash ?Psych: Denies depression, anxiety, memory loss, confusion. No homicidal or suicidal ideation.  ?Heme: Negative for prolonged bleeding, bruising easily, and swollen nodes. ?Endocrine: Negative for cold or heat  intolerance, polyuria, polydipsia and goiter. ?Neuro: negative for tremor, gait imbalance, syncope and seizures. ?The remainder of the review of systems is noncontributory. ? ?Physical Exam: ?BP (!) 142/81 (BP Location: Right Arm, Patient Position: Sitting, Cuff Size: Large)   Pulse 82   Temp 98.7 ?F (37.1 ?C) (Oral)   Ht '5\' 2"'$  (1.575 m)   Wt 198 lb 4.8 oz (89.9 kg)   LMP 02/10/2011   BMI 36.27 kg/m?  ?General:   Alert and oriented. No distress noted. Pleasant and cooperative.  ?Head:  Normocephalic and atraumatic. ?Eyes:  Conjuctiva clear without scleral icterus. ?Mouth:  Oral mucosa pink and moist. Good dentition. No lesions. ?Heart: Normal rate and rhythm, s1 and s2 heart sounds present.  ?Lungs: Clear lung sounds in all lobes. Respirations equal and unlabored. ?Abdomen:  +BS, soft, non-tender and non-distended. No rebound or guarding. No HSM or masses noted. ?Derm: No palmar erythema or jaundice ?Msk:  Symmetrical without gross deformities. Normal posture. ?Extremities:  Without edema. ?Neurologic:  Alert and  oriented x4 ?Psych:  Alert and cooperative. Normal mood and affect. ? ?Invalid input(s): 6 MONTHS  ? ?ASSESSMENT: ?JOSHLYN BEADLE is a 56 y.o. female presenting today as a new patient for 3-4 month  of dysphagia, mostly with thicker/dryer foods and larger pills. ? ?History of GERD, prescribed protonix '40mg'$  once daily, though tells me she only takes this as needed when she has acid reflux symptoms, maybe once

## 2021-07-18 NOTE — Patient Instructions (Signed)
We will get you set up for upper endoscopy for further evaluation of your issues swallowing ?Please continue to avoid thicker/dryer foods that seem to cause more swallowing difficulty, make sure you are taking small bites, with sips of liquids between and chewing thoroughly. ?Please try taking protonix atleast every other day, if symptoms are well controlled, you may even be able to take this every 2 days. The goal is to keep reflux symptoms at bay in order to prevent inflammation/irritation of the esophagus which can sometimes lead to these swallowing issues you are experiencing. ?Be mindful of trigger foods such as tomato based, fried, greasy foods, chocolate, caffeine and alcohol ?Stay upright 2-3 hours after eating, prior to lying down. ? ?Follow up will be determined after upper endoscopy ?

## 2021-07-19 ENCOUNTER — Other Ambulatory Visit (INDEPENDENT_AMBULATORY_CARE_PROVIDER_SITE_OTHER): Payer: Self-pay

## 2021-07-19 ENCOUNTER — Encounter (INDEPENDENT_AMBULATORY_CARE_PROVIDER_SITE_OTHER): Payer: Self-pay

## 2021-07-19 DIAGNOSIS — R1310 Dysphagia, unspecified: Secondary | ICD-10-CM

## 2021-07-19 LAB — HEMOGLOBIN A1C
Est. average glucose Bld gHb Est-mCnc: 169 mg/dL
Hgb A1c MFr Bld: 7.5 % — ABNORMAL HIGH (ref 4.8–5.6)

## 2021-07-19 LAB — LIPID PANEL WITH LDL/HDL RATIO
Cholesterol, Total: 100 mg/dL (ref 100–199)
HDL: 33 mg/dL — ABNORMAL LOW (ref >39)
LDL Chol Calc (NIH): 43 mg/dL (ref 0–99)
LDL/HDL Ratio: 1.3 ratio (ref 0.0–3.2)
Triglycerides: 136 mg/dL (ref 0–149)
VLDL Cholesterol Cal: 24 mg/dL (ref 5–40)

## 2021-07-19 LAB — CMP14+EGFR
ALT: 34 IU/L — ABNORMAL HIGH (ref 0–32)
AST: 29 IU/L (ref 0–40)
Albumin/Globulin Ratio: 2 (ref 1.2–2.2)
Albumin: 4.9 g/dL (ref 3.8–4.9)
Alkaline Phosphatase: 68 IU/L (ref 44–121)
BUN/Creatinine Ratio: 11 (ref 9–23)
BUN: 7 mg/dL (ref 6–24)
Bilirubin Total: 0.8 mg/dL (ref 0.0–1.2)
CO2: 23 mmol/L (ref 20–29)
Calcium: 9.5 mg/dL (ref 8.7–10.2)
Chloride: 101 mmol/L (ref 96–106)
Creatinine, Ser: 0.65 mg/dL (ref 0.57–1.00)
Globulin, Total: 2.5 g/dL (ref 1.5–4.5)
Glucose: 118 mg/dL — ABNORMAL HIGH (ref 70–99)
Potassium: 4 mmol/L (ref 3.5–5.2)
Sodium: 138 mmol/L (ref 134–144)
Total Protein: 7.4 g/dL (ref 6.0–8.5)
eGFR: 103 mL/min/{1.73_m2} (ref >59)

## 2021-07-21 ENCOUNTER — Other Ambulatory Visit: Payer: Self-pay | Admitting: Nurse Practitioner

## 2021-07-21 ENCOUNTER — Other Ambulatory Visit (HOSPITAL_COMMUNITY): Payer: Self-pay

## 2021-07-21 MED ORDER — VICTOZA 18 MG/3ML ~~LOC~~ SOPN
1.8000 mg | PEN_INJECTOR | Freq: Every day | SUBCUTANEOUS | 1 refills | Status: DC
  Filled 2021-07-21 – 2021-08-29 (×2): qty 6, 20d supply, fill #0

## 2021-07-21 NOTE — Progress Notes (Signed)
Hello Amy Dean! ? ?I had a chance to review your labs.  Your A1c is doing a lot better but is still not at goal of 7 or less.  I would like to increase your Victoza to 1.8 mg daily.  Please let us know if you have any issues with this increased dose.  Continue to monitor what you eat and try to eat a well-balanced diet with plenty of lean meats and vegetables.  As well as exercise for at least 30 minutes a day 3-5 times a week. ? ?Regarding your HDL (good cholesterol) eating healthy fats can help with this.  Try eating fish, almonds, Chia seeds, avocados, or other sources of healthy fat. ? ?The rest of your labs look pretty good.  We will plan to check your A1c in another 3 months.  Please call to schedule an appointment with [place].

## 2021-08-01 ENCOUNTER — Other Ambulatory Visit (HOSPITAL_COMMUNITY): Payer: Self-pay

## 2021-08-10 ENCOUNTER — Ambulatory Visit (HOSPITAL_COMMUNITY): Payer: 59 | Admitting: Anesthesiology

## 2021-08-10 ENCOUNTER — Encounter (HOSPITAL_COMMUNITY)
Admission: RE | Disposition: A | Discharge status: Home / Self Care | Payer: Self-pay | Source: Home / Self Care | Attending: Gastroenterology

## 2021-08-10 ENCOUNTER — Encounter (HOSPITAL_COMMUNITY): Payer: Self-pay | Admitting: Gastroenterology

## 2021-08-10 ENCOUNTER — Ambulatory Visit (HOSPITAL_COMMUNITY)
Admission: RE | Admit: 2021-08-10 | Discharge: 2021-08-10 | Disposition: A | Discharge status: Home / Self Care | Payer: 59 | Attending: Gastroenterology | Admitting: Gastroenterology

## 2021-08-10 ENCOUNTER — Ambulatory Visit (HOSPITAL_BASED_OUTPATIENT_CLINIC_OR_DEPARTMENT_OTHER): Payer: 59 | Admitting: Anesthesiology

## 2021-08-10 DIAGNOSIS — E785 Hyperlipidemia, unspecified: Secondary | ICD-10-CM | POA: Insufficient documentation

## 2021-08-10 DIAGNOSIS — R1310 Dysphagia, unspecified: Secondary | ICD-10-CM

## 2021-08-10 DIAGNOSIS — I10 Essential (primary) hypertension: Secondary | ICD-10-CM | POA: Insufficient documentation

## 2021-08-10 DIAGNOSIS — E119 Type 2 diabetes mellitus without complications: Secondary | ICD-10-CM | POA: Insufficient documentation

## 2021-08-10 DIAGNOSIS — D649 Anemia, unspecified: Secondary | ICD-10-CM | POA: Diagnosis not present

## 2021-08-10 DIAGNOSIS — Z79899 Other long term (current) drug therapy: Secondary | ICD-10-CM | POA: Insufficient documentation

## 2021-08-10 DIAGNOSIS — K219 Gastro-esophageal reflux disease without esophagitis: Secondary | ICD-10-CM

## 2021-08-10 DIAGNOSIS — Z7984 Long term (current) use of oral hypoglycemic drugs: Secondary | ICD-10-CM | POA: Insufficient documentation

## 2021-08-10 DIAGNOSIS — F418 Other specified anxiety disorders: Secondary | ICD-10-CM | POA: Diagnosis not present

## 2021-08-10 DIAGNOSIS — Z7985 Long-term (current) use of injectable non-insulin antidiabetic drugs: Secondary | ICD-10-CM | POA: Insufficient documentation

## 2021-08-10 DIAGNOSIS — K529 Noninfective gastroenteritis and colitis, unspecified: Secondary | ICD-10-CM | POA: Diagnosis not present

## 2021-08-10 HISTORY — PX: SAVORY DILATION: SHX5439

## 2021-08-10 HISTORY — PX: ESOPHAGOGASTRODUODENOSCOPY (EGD) WITH PROPOFOL: SHX5813

## 2021-08-10 LAB — GLUCOSE, CAPILLARY: Glucose-Capillary: 125 mg/dL — ABNORMAL HIGH (ref 70–99)

## 2021-08-10 SURGERY — ESOPHAGOGASTRODUODENOSCOPY (EGD) WITH PROPOFOL
Anesthesia: General

## 2021-08-10 MED ORDER — PROPOFOL 10 MG/ML IV BOLUS
INTRAVENOUS | Status: DC | PRN
  Administered 2021-08-10: 80 mg via INTRAVENOUS

## 2021-08-10 MED ORDER — LACTATED RINGERS IV SOLN: INTRAVENOUS | Status: DC

## 2021-08-10 MED ORDER — PROPOFOL 500 MG/50ML IV EMUL
INTRAVENOUS | Status: DC | PRN
  Administered 2021-08-10: 180 ug/kg/min via INTRAVENOUS

## 2021-08-10 NOTE — Discharge Instructions (Signed)
You are being discharged to home.  ?Resume your previous diet.  ?We are waiting for your pathology results.  ?Continue pantoprazole 40 mg qday. ?

## 2021-08-10 NOTE — Anesthesia Postprocedure Evaluation (Signed)
Anesthesia Post Note ? ?Patient: NAUTICA HOTZ ? ?Procedure(s) Performed: ESOPHAGOGASTRODUODENOSCOPY (EGD) WITH PROPOFOL ?SAVORY DILATION ? ?Patient location during evaluation: Endoscopy ?Anesthesia Type: General ?Level of consciousness: awake and alert and oriented ?Pain management: pain level controlled ?Vital Signs Assessment: post-procedure vital signs reviewed and stable ?Respiratory status: spontaneous breathing, nonlabored ventilation and respiratory function stable ?Cardiovascular status: blood pressure returned to baseline and stable ?Postop Assessment: no apparent nausea or vomiting ?Anesthetic complications: no ? ? ?No notable events documented. ? ? ?Last Vitals:  ?Vitals:  ? 08/10/21 0657 08/10/21 0800  ?BP: 139/72 (!) 100/56  ?Pulse: 75 84  ?Resp: (!) 21 18  ?Temp: 36.8 ?C 36.7 ?C  ?SpO2: 95% 96%  ?  ?Last Pain:  ?Vitals:  ? 08/10/21 0800  ?TempSrc: Oral  ?PainSc:   ? ? ?  ?  ?  ?  ?  ?  ? ?Sylar Voong C Ertha Nabor ? ? ? ? ?

## 2021-08-10 NOTE — Anesthesia Preprocedure Evaluation (Addendum)
Anesthesia Evaluation  ?Patient identified by MRN, date of birth, ID band ?Patient awake ? ? ? ?Reviewed: ?Allergy & Precautions, NPO status , Patient's Chart, lab work & pertinent test results ? ?Airway ?Mallampati: II ? ?TM Distance: >3 FB ?Neck ROM: Full ? ? ? Dental ? ?(+) Dental Advisory Given, Missing ?  ?Pulmonary ?neg pulmonary ROS,  ?  ?Pulmonary exam normal ?breath sounds clear to auscultation ? ? ? ? ? ? Cardiovascular ?Exercise Tolerance: Good ?hypertension, Pt. on medications ?Normal cardiovascular exam ?Rhythm:Regular Rate:Normal ? ? ?  ?Neuro/Psych ? Headaches, PSYCHIATRIC DISORDERS Anxiety Depression   ? GI/Hepatic ?Neg liver ROS, GERD  Medicated and Controlled,  ?Endo/Other  ?diabetes (on victoza), Well Controlled, Type 2, Oral Hypoglycemic Agents ? Renal/GU ?negative Renal ROS  ?negative genitourinary ?  ?Musculoskeletal ?negative musculoskeletal ROS ?(+)  ? Abdominal ?  ?Peds ?negative pediatric ROS ?(+)  Hematology ? ?(+) Blood dyscrasia, anemia ,   ?Anesthesia Other Findings ?Snoring  ? Reproductive/Obstetrics ?negative OB ROS ? ?  ? ? ? ? ? ? ? ? ? ? ? ? ? ?  ?  ? ? ? ? ? ? ?Anesthesia Physical ?Anesthesia Plan ? ?ASA: 2 ? ?Anesthesia Plan: General  ? ?Post-op Pain Management: Minimal or no pain anticipated  ? ?Induction: Intravenous ? ?PONV Risk Score and Plan: Propofol infusion ? ?Airway Management Planned: Nasal Cannula and Natural Airway ? ?Additional Equipment:  ? ?Intra-op Plan:  ? ?Post-operative Plan:  ? ?Informed Consent: I have reviewed the patients History and Physical, chart, labs and discussed the procedure including the risks, benefits and alternatives for the proposed anesthesia with the patient or authorized representative who has indicated his/her understanding and acceptance.  ? ? ? ?Dental advisory given ? ?Plan Discussed with: Surgeon and CRNA ? ?Anesthesia Plan Comments:   ? ? ? ? ? ? ?Anesthesia Quick Evaluation ? ?

## 2021-08-10 NOTE — Transfer of Care (Signed)
Immediate Anesthesia Transfer of Care Note ? ?Patient: Amy Dean ? ?Procedure(s) Performed: ESOPHAGOGASTRODUODENOSCOPY (EGD) WITH PROPOFOL ?SAVORY DILATION ? ?Patient Location: PACU ? ?Anesthesia Type:General ? ?Level of Consciousness: awake, alert  and oriented ? ?Airway & Oxygen Therapy: Patient Spontanous Breathing ? ?Post-op Assessment: Report given to RN, Post -op Vital signs reviewed and stable, Patient moving all extremities X 4 and Patient able to stick tongue midline ? ?Post vital signs: Reviewed ? ?Last Vitals:  ?Vitals Value Taken Time  ?BP 100/56   ?Temp 97.6   ?Pulse 84   ?Resp 21   ?SpO2 96   ? ? ?Last Pain:  ?Vitals:  ? 08/10/21 0741  ?TempSrc:   ?PainSc: 0-No pain  ?   ? ?Patients Stated Pain Goal: 9 (08/10/21 5486) ? ?Complications: No notable events documented. ?

## 2021-08-10 NOTE — Op Note (Signed)
[facility] ?Patient Name: Amy Dean ?Procedure Date: 08/10/2021 7:02 AM ?MRN: [id number] ?Date of Birth: Feb 25, 1966 ?Attending MD: Maylon Peppers ,  ?CSN: [id number] ?Age: 56 ?Admit Type: Outpatient ?Procedure:                Upper GI endoscopy ?Indications:              Dysphagia, Follow-up of gastro-esophageal reflux  ?                          disease ?Providers:                Maylon Peppers, Charlsie Quest Theda Sers RN, RN, Kenney Houseman  ?                          [place] ?Referring MD:              ?Medicines:                Monitored Anesthesia Care ?Complications:            No immediate complications. ?Estimated Blood Loss:     Estimated blood loss: none. ?Procedure:                Pre-Anesthesia Assessment: ?                          - Prior to the procedure, a History and Physical  ?                          was performed, and patient medications, allergies  ?                          and sensitivities were reviewed. The patient's  ?                          tolerance of previous anesthesia was reviewed. ?                          - The risks and benefits of the procedure and the  ?                          sedation options and risks were discussed with the  ?                          patient. All questions were answered and informed  ?                          consent was obtained. ?                          - ASA Grade Assessment: II - A patient with mild  ?                          systemic disease. ?                          After obtaining informed consent, the endoscope was  ?  passed under direct vision. Throughout the  ?                          procedure, the patient's blood pressure, pulse, and  ?                          oxygen saturations were monitored continuously. The  ?                          GIF-H190 ([id number]) scope was introduced through the  ?                          mouth, and advanced to the second part of duodenum.  ?                          The upper GI  endoscopy was accomplished without  ?                          difficulty. The patient tolerated the procedure  ?                          well. ?Scope In: 7:42:55 AM ?Scope Out: 7:54:11 AM ?Total Procedure Duration: 0 hours 11 minutes 16 seconds  ?Findings: ?     No endoscopic abnormality was evident in the esophagus to explain the  ?     patient's complaint of dysphagia. It was decided, however, to proceed  ?     with dilation of the entire esophagus. A guidewire was placed and the  ?     scope was withdrawn. Dilation was performed with a Savary dilator with  ?     no resistance at 18 mm. No mucosal disruption was seen upon  ?     reinspection. Biopsies were obtained from the proximal and distal  ?     esophagus with cold forceps for histology of eosinophilic esophagitis. ?     The entire examined stomach was normal. ?     The examined duodenum was normal. ?Impression:               - No endoscopic esophageal abnormality to explain  ?                          patient's dysphagia. Esophagus dilated. Dilated.  ?                          Biopsied. ?                          - Normal stomach. ?                          - Normal examined duodenum. ?Moderate Sedation: ?     Per Anesthesia Care ?Recommendation:           - Discharge patient to home (ambulatory). ?                          - Resume previous diet. ?                          -  Await pathology results. ?                          - Continue pantoprazole 40 mg qday. ?Procedure Code(s):        --- Professional --- ?                          [phone], Esophagogastroduodenoscopy, flexible,  ?                          transoral; with insertion of guide wire followed by  ?                          passage of dilator(s) through esophagus over guide  ?                          wire ?                          43239, 59, Esophagogastroduodenoscopy, flexible,  ?                          transoral; with biopsy, single or multiple ?Diagnosis Code(s):        --- Professional  --- ?                          R13.10, Dysphagia, unspecified ?                          K21.9, Gastro-esophageal reflux disease without  ?                          esophagitis ?CPT copyright 2019 American Medical Association. All rights reserved. ?The codes documented in this report are preliminary and upon coder review may  ?be revised to meet current compliance requirements. ?Maylon Peppers, MD ?Maylon Peppers,  ?08/10/2021 7:59:55 AM ?This report has been signed electronically. ?Number of Addenda: 0 ?

## 2021-08-10 NOTE — Interval H&P Note (Signed)
History and Physical Interval Note: ? ?08/10/2021 ?7:34 AM ? ?Amy Dean  has presented today for surgery, with the diagnosis of dysphagia.  The various methods of treatment have been discussed with the patient and family. After consideration of risks, benefits and other options for treatment, the patient has consented to  Procedure(s) with comments: ?ESOPHAGOGASTRODUODENOSCOPY (EGD) WITH PROPOFOL (N/A) - 730 as a surgical intervention.  The patient's history has been reviewed, patient examined, no change in status, stable for surgery.  I have reviewed the patient's chart and labs.  Questions were answered to the patient's satisfaction.   ? ? ?Amy Dean ? ? ?

## 2021-08-11 LAB — SURGICAL PATHOLOGY

## 2021-08-15 ENCOUNTER — Encounter: Payer: Self-pay | Admitting: Nurse Practitioner

## 2021-08-15 ENCOUNTER — Ambulatory Visit: Payer: 59 | Admitting: Nurse Practitioner

## 2021-08-16 ENCOUNTER — Other Ambulatory Visit (HOSPITAL_COMMUNITY): Payer: Self-pay

## 2021-08-16 ENCOUNTER — Encounter (HOSPITAL_COMMUNITY): Payer: Self-pay | Admitting: Gastroenterology

## 2021-08-29 ENCOUNTER — Other Ambulatory Visit (HOSPITAL_COMMUNITY): Payer: Self-pay

## 2021-08-30 ENCOUNTER — Other Ambulatory Visit: Payer: Self-pay | Admitting: Nurse Practitioner

## 2021-08-30 ENCOUNTER — Other Ambulatory Visit (HOSPITAL_COMMUNITY): Payer: Self-pay

## 2021-08-30 MED ORDER — VICTOZA 18 MG/3ML ~~LOC~~ SOPN
1.8000 mg | PEN_INJECTOR | Freq: Every day | SUBCUTANEOUS | 1 refills | Status: DC
  Filled 2021-08-30 – 2021-10-11 (×2): qty 9, 30d supply, fill #0

## 2021-09-07 ENCOUNTER — Other Ambulatory Visit (HOSPITAL_COMMUNITY): Payer: Self-pay

## 2021-09-08 ENCOUNTER — Ambulatory Visit: Payer: 59 | Admitting: Nurse Practitioner

## 2021-09-08 ENCOUNTER — Encounter: Payer: Self-pay | Admitting: Nurse Practitioner

## 2021-09-08 VITALS — BP 135/85 | HR 75 | Temp 98.1°F | Ht 62.0 in | Wt 204.4 lb

## 2021-09-08 DIAGNOSIS — R6882 Decreased libido: Secondary | ICD-10-CM | POA: Diagnosis not present

## 2021-09-08 DIAGNOSIS — R1310 Dysphagia, unspecified: Secondary | ICD-10-CM

## 2021-09-08 DIAGNOSIS — E119 Type 2 diabetes mellitus without complications: Secondary | ICD-10-CM

## 2021-09-08 NOTE — Progress Notes (Signed)
? ?  Subjective:  ? ? Patient ID: Amy Dean, female    DOB: April 10, 1966, 56 y.o.   MRN: [id number] ? ?HPI ? ?Patient here for follow up. No problems with swallowing since having esophagus stretched. ? ?Patient has question about weight loss and low libido. ? ?Patient states that if she is working with a Estate manager/land agent to help with weight loss. ? ?Patient states that her libido is low that she just is not in the mood. ? ?Review of Systems  ?Constitutional:   ?     Low libido  ?All other systems reviewed and are negative. ? ?   ?Objective:  ? Physical Exam ?Vitals reviewed.  ?Constitutional:   ?   General: She is not in acute distress. ?   Appearance: Normal appearance. She is obese. She is not ill-appearing, toxic-appearing or diaphoretic.  ?Cardiovascular:  ?   Rate and Rhythm: Normal rate and regular rhythm.  ?   Pulses: Normal pulses.  ?   Heart sounds: Normal heart sounds. No murmur heard. ?Pulmonary:  ?   Effort: Pulmonary effort is normal. No respiratory distress.  ?   Breath sounds: Normal breath sounds. No wheezing.  ?Musculoskeletal:  ?   Comments: Grossly intact  ?Skin: ?   General: Skin is warm.  ?   Capillary Refill: Capillary refill takes less than 2 seconds.  ?Neurological:  ?   Mental Status: She is alert.  ?   Comments: Grossly intact  ?Psychiatric:     ?   Mood and Affect: Mood normal.     ?   Behavior: Behavior normal.  ? ? ? ? ? ?   ?Assessment & Plan:  ? ?1. Dysphagia, unspecified type ?-Stable ?-Patient states that dysphagia is a lot better after having the procedure. ?-Patient denies any issues ? ?2. Morbid obesity ([place]) ?-Encourage patient to eat whole grains and brown rice ?-Encourage patient to calorie count to ensure that she is eating appropriate serving sizes. ?-Continue to work with Estate manager/land agent to find alternatives treatments for weight loss ?-Try to exercise for 3-5 times a week for 30 minutes ?-Patient currently taking Victoza for diabetes however has not experienced  much weight loss. ? ?3. Low libido ?-Try to find times to relax. ?-Incorporate the night into you and your partners daily routine. ?-May use vaginal lubricant for vaginal dryness if necessary. ?-Return to clinic if symptoms not better or worsen ? ?4. Controlled type 2 diabetes mellitus without complication, without long-term current use of insulin ([place]) ?-Last A1c was 7.5.  Not at goal of 7 or less ?-Victoza was increased to 1.8 about a month ago ?-Patient to follow-up with Dr. Nicki Dean for A1c check and diabetes check. ? ?  ?Note:  This document was prepared using Dragon voice recognition software and may include unintentional dictation errors. ?Note - This record has been created using [employer].  ?Chart creation errors have been sought, but may not always  ?have been located. Such creation errors do not reflect on  ?the standard of medical care. ? ? ?

## 2021-09-11 ENCOUNTER — Encounter: Payer: Self-pay | Admitting: Cardiology

## 2021-09-11 NOTE — Progress Notes (Deleted)
Cardiology Office Note  Date: 09/11/2021   ID: Amy Dean, DOB 1965-08-02, MRN [id number]  PCP:  Kathyrn Drown, MD  Cardiologist:  None Electrophysiologist:  None   No chief complaint on file.   History of Present Illness: Amy Dean is a 56 y.o. female referred for cardiology consultation by Ms. Ameduite NP for the evaluation of chest discomfort and tachycardia (consultation was requested in March per chart review).  I reviewed her records.  I reviewed the ECGs from ER visit in March, she was found to be in SVT in the 140s, responded to adenosine with conversion to sinus rhythm.  High-sensitivity troponin I level normal as was TSH.  Past Medical History:  Diagnosis Date   Anemia    Resolved post hysterectomy   Enteritis    Essential hypertension    Headache    Hyperlipidemia    SVT (supraventricular tachycardia) [facility])    Documented March 2023 - converted with adenosine   Type 2 diabetes mellitus [facility])     Past Surgical History:  Procedure Laterality Date   ABDOMINAL HYSTERECTOMY     CESAREAN SECTION  1982 and 1989   x2   COLONOSCOPY N/A 04/22/2015   Procedure: COLONOSCOPY;  Surgeon: Rogene Houston, MD;  Location[facility] END[facility];  Service: Endoscopy;  Laterality: N/A;  1:00   ESOPHAGOGASTRODUODENOSCOPY (EGD) WITH PROPOFOL N/A 08/10/2021   Procedure: ESOPHAGOGASTRODUODENOSCOPY (EGD) WITH PROPOFOL;  Surgeon: Harvel Quale, MD;  Location: [facility] ENDO SUITE;  Service: Gastroenterology;  Laterality: N/A;  730   EUS  10/26/2011   Procedure: UPPER ENDOSCOPIC ULTRASOUND (EUS) LINEAR;  Surgeon: Milus Banister, MD;  Location: [facility] ENDOSCOPY;  Service: Endoscopy;  Laterality: N/A;   HYSTEROSCOPY W/ ENDOMETRIAL ABLATION  07/22/2008   K.Harrington Challenger MD [facility]   LAPAROSCOPIC APPENDECTOMY N/A 01/03/2021   Procedure: APPENDECTOMY LAPAROSCOPIC;  Surgeon: Aviva Signs, MD;  Location: [facility] ORS;  Service: General;  Laterality: N/A;   SAVORY DILATION  08/10/2021   Procedure:  SAVORY DILATION;  Surgeon: Montez Morita, Quillian Quince, MD;  Location: [facility] ENDO [facility];  Service: Gastroenterology;;   SUPRACERVICAL ABDOMINAL HYSTERECTOMY  03/21/2011   Procedure: HYSTERECTOMY SUPRACERVICAL ABDOMINAL;  Surgeon: Jonnie Kind, MD;  Location: [facility] ORS;  Service: Gynecology;;   TONSILLECTOMY     TUBAL LIGATION      Current Outpatient Medications  Medication Sig Dispense Refill   ALPRAZolam (XANAX) 0.5 MG tablet Take 1 tablet (0.5 mg total) by mouth at bedtime as needed for sleep. 30 tablet 5   amLODipine (NORVASC) 10 MG tablet Take 1 tablet (10 mg total) by mouth daily. 90 tablet 1   aspirin 81 MG tablet Take 81 mg by mouth daily.     hydrALAZINE (APRESOLINE) 25 MG tablet Take 1 tablet (25 mg total) by mouth 2 (two) times daily. (Patient not taking: Reported on 09/08/2021) 60 tablet 2   Insulin Pen Needle (PEN NEEDLES 31GX5/16") 31G X 8 MM MISC Use daily as directed 100 each 11   liraglutide (VICTOZA) 18 MG/3ML SOPN Inject 1.8 mg into the skin daily. 9 mL 1   lisinopril-hydrochlorothiazide (ZESTORETIC) 20-12.5 MG tablet Take 1 tablet by mouth daily for blood pressure. 90 tablet 1   metFORMIN (GLUCOPHAGE) 500 MG tablet Take 2  tablets by mouth in the morning and 1 tablet by mouth in the evening (Patient taking differently: Take 1,000 mg by mouth daily with breakfast.) 270 tablet 0   pantoprazole (PROTONIX) 40 MG tablet Take 1 tablet (40 mg total) by  mouth daily for acid reflux. 90 tablet 1   rosuvastatin (CRESTOR) 40 MG tablet Take 1 tablet (40 mg total) by mouth daily. 90 tablet 1   triamcinolone cream (KENALOG) 0.1 % Apply a thin amount to finger as needed when flare up occurs 45 g 1   No current facility-administered medications for this visit.   Allergies:  Patient has no known allergies.   Social History: The patient  reports that she has never smoked. She has never been exposed to tobacco smoke. She has never used smokeless tobacco. She reports current alcohol use of about 4.0  standard drinks per week. She reports that she does not use drugs.   Family History: The patient's family history includes Healthy in her son; Heart attack in her mother and paternal uncle; Hypertension in her maternal grandmother; Other in her father; Sarcoidosis in her mother; Stroke in her mother.   ROS:  Please see the history of present illness. Otherwise, complete review of systems is positive for {NONE DEFAULTED:18576}.  All other systems are reviewed and negative.   Physical Exam: VS:  LMP 02/10/2011 , BMI There is no height or weight on file to calculate BMI.  Wt Readings from Last 3 Encounters:  09/08/21 204 lb 6.4 oz (92.7 kg)  08/10/21 192 lb (87.1 kg)  07/18/21 198 lb 4.8 oz (89.9 kg)    General: Patient appears comfortable at rest. HEENT: Conjunctiva and lids normal, oropharynx clear with moist mucosa. Neck: Supple, no elevated JVP or carotid bruits, no thyromegaly. Lungs: Clear to auscultation, nonlabored breathing at rest. Cardiac: Regular rate and rhythm, no S3 or significant systolic murmur, no pericardial rub. Abdomen: Soft, nontender, no hepatomegaly, bowel sounds present, no guarding or rebound. Extremities: No pitting edema, distal pulses 2+. Skin: Warm and dry. Musculoskeletal: No kyphosis. Neuropsychiatric: Alert and oriented x3, affect grossly appropriate.  ECG:  An ECG dated 07/17/2021 was personally reviewed today and demonstrated:  Sinus rhythm with LVH, left anterior fascicular block, nonspecific ST changes.  Recent Labwork: 07/17/2021: Hemoglobin 14.7; Magnesium 1.7; Platelets 229; TSH 1.002 07/18/2021: ALT 34; AST 29; BUN 7; Creatinine, Ser 0.65; Potassium 4.0; Sodium 138     Component Value Date/Time   CHOL 100 07/18/2021 0909   TRIG 136 07/18/2021 0909   HDL 33 (L) 07/18/2021 0909   CHOLHDL 3.3 05/11/2021 0814   CHOLHDL 2.5 12/18/2019 0847   VLDL 33 09/26/2018 1324   LDLCALC 43 07/18/2021 0909   LDLCALC 64 12/18/2019 0847    Other Studies  Reviewed Today:  Echocardiogram 07/15/2018:  1. The left ventricle has a visually estimated ejection fraction of of  50%. The cavity size was normal. There is mild concentric left ventricular  hypertrophy. Left ventricular diastolic Doppler parameters are consistent  with impaired relaxation.  Elevated left ventricular end-diastolic pressure Left ventricular diffuse  hypokinesis. No evidence of left ventricular regional wall motion  abnormalities.   2. The right ventricle has normal systolic function. The cavity was  normal. There is no increase in right ventricular wall thickness.   3. The mitral valve is grossly normal.   4. The tricuspid valve is grossly normal.   5. The aortic valve is tricuspid.   6. The aortic root is normal in size and structure.   Chest x-ray 07/17/2021: FINDINGS: Numerous leads and wires project over the chest. External pacer/defibrillator projects over the lower chest.   Midline trachea. Borderline cardiomegaly. Mediastinal contours otherwise within normal limits. No pleural effusion or pneumothorax. No congestive failure. Clear lungs.  IMPRESSION: Borderline cardiomegaly, without acute disease.  Assessment and Plan:   Medication Adjustments/Labs and Tests Ordered: Current medicines are reviewed at length with the patient today.  Concerns regarding medicines are outlined above.   Tests Ordered: No orders of the defined types were placed in this encounter.   Medication Changes: No orders of the defined types were placed in this encounter.   Disposition:  Follow up {follow up:15908}  Signed, Satira Sark, MD, [facility] 09/11/2021 11:42 AM    [facility] [place]. [address] [place], [place] [address] Phone: [phone]; Fax: [phone]

## 2021-09-12 ENCOUNTER — Encounter: Payer: Self-pay | Admitting: Cardiology

## 2021-09-12 ENCOUNTER — Ambulatory Visit: Payer: 59 | Admitting: Cardiology

## 2021-10-11 ENCOUNTER — Other Ambulatory Visit: Payer: Self-pay | Admitting: Family Medicine

## 2021-10-11 ENCOUNTER — Other Ambulatory Visit (HOSPITAL_COMMUNITY): Payer: Self-pay

## 2021-10-11 MED ORDER — ROSUVASTATIN CALCIUM 40 MG PO TABS
40.0000 mg | ORAL_TABLET | Freq: Every day | ORAL | 1 refills | Status: DC
  Filled 2021-10-11: qty 90, 90d supply, fill #0
  Filled 2022-01-23: qty 90, 90d supply, fill #1

## 2021-10-13 ENCOUNTER — Other Ambulatory Visit (HOSPITAL_COMMUNITY): Payer: Self-pay

## 2021-10-13 ENCOUNTER — Ambulatory Visit (INDEPENDENT_AMBULATORY_CARE_PROVIDER_SITE_OTHER): Payer: 59 | Admitting: Family Medicine

## 2021-10-13 ENCOUNTER — Encounter: Payer: Self-pay | Admitting: Family Medicine

## 2021-10-13 VITALS — BP 124/62 | HR 78 | Temp 97.7°F | Wt 200.6 lb

## 2021-10-13 DIAGNOSIS — E119 Type 2 diabetes mellitus without complications: Secondary | ICD-10-CM

## 2021-10-13 DIAGNOSIS — E785 Hyperlipidemia, unspecified: Secondary | ICD-10-CM

## 2021-10-13 DIAGNOSIS — E1169 Type 2 diabetes mellitus with other specified complication: Secondary | ICD-10-CM

## 2021-10-13 DIAGNOSIS — I10 Essential (primary) hypertension: Secondary | ICD-10-CM

## 2021-10-13 MED ORDER — TIRZEPATIDE 2.5 MG/0.5ML ~~LOC~~ SOAJ
2.5000 mg | SUBCUTANEOUS | 1 refills | Status: DC
  Filled 2021-10-13: qty 2, 28d supply, fill #0
  Filled 2021-11-02: qty 2, 28d supply, fill #1

## 2021-10-13 NOTE — Patient Instructions (Signed)

## 2021-10-13 NOTE — Progress Notes (Signed)
   Subjective:    Patient ID: Amy Dean, female    DOB: [birth year], 56 y.o.   MRN: [id number]  Diabetes She presents for her follow-up diabetic visit. She has type 2 diabetes mellitus. There are no hypoglycemic associated symptoms. There are no diabetic associated symptoms. There are no hypoglycemic complications. There are no diabetic complications. Risk factors for coronary artery disease include hypertension. Current diabetic treatment includes oral agent (monotherapy). She does not see a podiatrist.Eye exam is not current (unable to get appt).   Patient does have underlying blood pressure Also cholesterol Tries to watch her diet Weight higher than what it should be    Review of Systems     Objective:   Physical Exam General-in no acute distress Eyes-no discharge Lungs-respiratory rate normal, CTA CV-no murmurs,RRR Extremities skin warm dry no edema Neuro grossly normal Behavior normal, alert  Diabetic foot exam normal       Assessment & Plan:  1. Essential hypertension Blood pressure decent control continue current measures If she is able to lose weight she may be able to come off of some of the blood pressure medicines - Hemoglobin A1c - Hepatic function panel - Basic metabolic panel  2. Controlled type 2 diabetes mellitus without complication, without long-term current use of insulin (HCC) Diabetes check A1c watch diet Previous A1c not been under good control Already on Victoza I would recommend switching from Victoza to Mounjaro 2.5 mg subcutaneous once a week Patient aware of potential side effects Stop Victoza Patient to send [place] update every 3 to 4 weeks regarding Mounjaro gradually increase the dose to get A1c under control plus also help her lose weight Follow-up in 3 months May be able to come down on some of her other medicines as she loses weight If significant nausea vomiting or abdominal pain stop medicine notify us right away - Hemoglobin  A1c - Hepatic function panel - Basic metabolic panel - Hemoglobin A1c  3. Hyperlipidemia associated with type 2 diabetes mellitus (HCC) Continue current measures - Hemoglobin A1c - Hepatic function panel - Basic metabolic panel  4. Morbid obesity ([place]) Portion control see discussion above  Follow-up 3 months

## 2021-10-14 ENCOUNTER — Other Ambulatory Visit: Payer: Self-pay | Admitting: Family Medicine

## 2021-10-14 DIAGNOSIS — E119 Type 2 diabetes mellitus without complications: Secondary | ICD-10-CM

## 2021-10-14 DIAGNOSIS — E1169 Type 2 diabetes mellitus with other specified complication: Secondary | ICD-10-CM

## 2021-10-14 DIAGNOSIS — I10 Essential (primary) hypertension: Secondary | ICD-10-CM

## 2021-10-14 DIAGNOSIS — Z79899 Other long term (current) drug therapy: Secondary | ICD-10-CM

## 2021-10-20 DIAGNOSIS — E1169 Type 2 diabetes mellitus with other specified complication: Secondary | ICD-10-CM | POA: Diagnosis not present

## 2021-10-20 DIAGNOSIS — Z79899 Other long term (current) drug therapy: Secondary | ICD-10-CM | POA: Diagnosis not present

## 2021-10-20 DIAGNOSIS — E785 Hyperlipidemia, unspecified: Secondary | ICD-10-CM | POA: Diagnosis not present

## 2021-10-20 DIAGNOSIS — I10 Essential (primary) hypertension: Secondary | ICD-10-CM | POA: Diagnosis not present

## 2021-10-20 DIAGNOSIS — E119 Type 2 diabetes mellitus without complications: Secondary | ICD-10-CM | POA: Diagnosis not present

## 2021-10-21 LAB — HEPATIC FUNCTION PANEL
ALT: 24 IU/L (ref 0–32)
AST: 25 IU/L (ref 0–40)
Albumin: 4.8 g/dL (ref 3.8–4.9)
Alkaline Phosphatase: 65 IU/L (ref 44–121)
Bilirubin Total: 0.4 mg/dL (ref 0.0–1.2)
Bilirubin, Direct: 0.14 mg/dL (ref 0.00–0.40)
Total Protein: 7.4 g/dL (ref 6.0–8.5)

## 2021-10-21 LAB — BASIC METABOLIC PANEL
BUN/Creatinine Ratio: 30 — ABNORMAL HIGH (ref 9–23)
BUN: 21 mg/dL (ref 6–24)
CO2: 22 mmol/L (ref 20–29)
Calcium: 9.6 mg/dL (ref 8.7–10.2)
Chloride: 103 mmol/L (ref 96–106)
Creatinine, Ser: 0.69 mg/dL (ref 0.57–1.00)
Glucose: 124 mg/dL — ABNORMAL HIGH (ref 70–99)
Potassium: 4 mmol/L (ref 3.5–5.2)
Sodium: 140 mmol/L (ref 134–144)
eGFR: 101 mL/min/{1.73_m2} (ref >59)

## 2021-10-21 LAB — HEMOGLOBIN A1C
Est. average glucose Bld gHb Est-mCnc: 143 mg/dL
Hgb A1c MFr Bld: 6.6 % — ABNORMAL HIGH (ref 4.8–5.6)

## 2021-11-01 ENCOUNTER — Telehealth: Payer: Self-pay | Admitting: Family Medicine

## 2021-11-01 NOTE — Telephone Encounter (Signed)
Pt called into office to let Dr.Scott know that she is doing well on Mounjaro.

## 2021-11-02 ENCOUNTER — Other Ambulatory Visit (HOSPITAL_COMMUNITY): Payer: Self-pay

## 2021-11-02 ENCOUNTER — Other Ambulatory Visit: Payer: Self-pay | Admitting: *Deleted

## 2021-11-02 MED ORDER — TIRZEPATIDE 5 MG/0.5ML ~~LOC~~ SOAJ
5.0000 mg | SUBCUTANEOUS | 2 refills | Status: DC
  Filled 2021-11-02: qty 2, 28d supply, fill #0

## 2021-11-02 NOTE — Telephone Encounter (Signed)
Patient says she is willing to bump up the dosage.  She has done good on the previous dose. Please advise. Thank you

## 2021-11-02 NOTE — Telephone Encounter (Signed)
Prescription sent to pharmacy.

## 2021-11-02 NOTE — Telephone Encounter (Signed)
Recommend 5 mg weekly May send in 1 month supply Mounjaro 2 refills Patient to give [place] feedback in 3 to 4 weeks after starting the new dose If any ongoing troubles let us know

## 2021-11-02 NOTE — Telephone Encounter (Signed)
Patient informed to give feed back in 3-4 weeks and if any ongoing troubles to let us know. Verbalized understanding.

## 2021-11-21 ENCOUNTER — Other Ambulatory Visit: Payer: Self-pay | Admitting: Family Medicine

## 2021-11-21 ENCOUNTER — Other Ambulatory Visit (HOSPITAL_COMMUNITY): Payer: Self-pay

## 2021-11-22 ENCOUNTER — Other Ambulatory Visit (HOSPITAL_COMMUNITY): Payer: Self-pay

## 2021-11-22 MED ORDER — TRIAMCINOLONE ACETONIDE 0.1 % EX CREA
TOPICAL_CREAM | CUTANEOUS | 1 refills | Status: DC
  Filled 2021-11-22: qty 45, 30d supply, fill #0
  Filled 2022-01-24: qty 45, 30d supply, fill #1

## 2021-11-24 ENCOUNTER — Encounter: Payer: Self-pay | Admitting: Family Medicine

## 2021-11-24 ENCOUNTER — Ambulatory Visit: Payer: 59 | Admitting: Family Medicine

## 2021-11-24 ENCOUNTER — Ambulatory Visit (HOSPITAL_COMMUNITY)
Admission: RE | Admit: 2021-11-24 | Discharge: 2021-11-24 | Disposition: A | Discharge status: Home / Self Care | Payer: 59 | Source: Ambulatory Visit | Attending: Family Medicine | Admitting: Family Medicine

## 2021-11-24 VITALS — BP 158/77 | HR 78 | Temp 97.8°F | Wt 194.6 lb

## 2021-11-24 DIAGNOSIS — I6381 Other cerebral infarction due to occlusion or stenosis of small artery: Secondary | ICD-10-CM | POA: Diagnosis not present

## 2021-11-24 DIAGNOSIS — G459 Transient cerebral ischemic attack, unspecified: Secondary | ICD-10-CM | POA: Diagnosis not present

## 2021-11-24 DIAGNOSIS — R200 Anesthesia of skin: Secondary | ICD-10-CM | POA: Insufficient documentation

## 2021-11-24 NOTE — Progress Notes (Signed)
Subjective:  Patient ID: Amy Dean, female    DOB: [birth year]  Age: 56 y.o. MRN: [id number]  CC: Chief Complaint  Patient presents with   Numbness    Pt began having left side numbness on Tuesday. 2 episodes Tuesday once yesterday and no episodes as of yet. Happened around 4:30 pm all times. Pt states numbness will start in fingers, move to hand and then up arm to face area. Pt reports slight headache and dizziness.     HPI:  56 year old female with mild LVH, HTN, HLD, Type 2 Diabetes presents with the above complaints.   Patient reports that on Tuesday she developed numbness of the left hand and arm as well as the left side of her face and around the mouth. Lasted for 15 mins and resolved. Occurred again on Tuesday and resolved after about 15 mins. Had another occurrence yesterday. Has not had this recur today. Reports that she has had some dizziness/lightheadedness and a mild headache as well. No known relieving factors. No reports of facial droop, vision changes, weakness. No difficulties with speech.  Relatively recent addition of Mounjaro. No other new changes.  Patient Active Problem List   Diagnosis Date Noted   Left facial numbness 11/24/2021   Mild concentric left ventricular hypertrophy (LVH) 07/18/2021   Dyshidrotic eczema 08/10/2020   Hyperlipidemia associated with type 2 diabetes mellitus ([place]) 04/01/2014   Depression with anxiety 01/05/2014   Gastroesophageal reflux disease 12/18/2011   Diabetes mellitus type II, controlled ([place]) 03/23/2011    Class: Chronic   Essential hypertension 12/10/2008    Social Hx   Social History   Socioeconomic History   Marital status: Married    Spouse name: Ardelle, [place].   Number of children: 1   Years of education: 12th grade   Highest education level: Not on file  Occupational History   Occupation: CSPDT    Employer: [employer]  Tobacco Use   Smoking status: Never    Passive exposure: Never   Smokeless  tobacco: Never  Vaping Use   Vaping Use: Never used  Substance and Sexual Activity   Alcohol use: Yes    Alcohol/week: 4.0 standard drinks of alcohol    Types: 4 Cans of beer per week   Drug use: No   Sexual activity: Yes    Birth control/protection: Surgical  Other Topics Concern   Not on file  Social History Narrative   Lives with her husband,married for 33 years.   Right-handed.   Occasional soda (1-2 times per week).   Works at [employer].   Social Determinants of Health   Financial Resource Strain: Not on file  Food Insecurity: Not on file  Transportation Needs: Not on file  Physical Activity: Not on file  Stress: Not on file  Social Connections: Not on file    Review of Systems Per HPI  Objective:  BP (!) 158/77   Pulse 78   Temp 97.8 F (36.6 C)   Wt 194 lb 9.6 oz (88.3 kg)   LMP 02/10/2011   SpO2 97%   BMI 35.59 kg/m      11/24/2021   10:58 AM 10/13/2021    3:06 PM 09/08/2021    3:07 PM  BP/Weight  Systolic BP 258 527 782  Diastolic BP 77 62 85  Wt. (Lbs) 194.6 200.6 204.4  BMI 35.59 kg/m2 36.69 kg/m2 37.39 kg/m2    Physical Exam Vitals and nursing note reviewed.  Constitutional:  General: She is not in acute distress.    Appearance: Normal appearance. She is not ill-appearing.  HENT:     Head: Normocephalic and atraumatic.     Mouth/Throat:     Pharynx: Oropharynx is clear.  Eyes:     General:        Right eye: No discharge.        Left eye: No discharge.     Extraocular Movements: Extraocular movements intact.     Conjunctiva/sclera: Conjunctivae normal.     Pupils: Pupils are equal, round, and reactive to light.  Cardiovascular:     Rate and Rhythm: Normal rate and regular rhythm.     Heart sounds: No murmur heard. Pulmonary:     Effort: Pulmonary effort is normal.     Breath sounds: Normal breath sounds. No wheezing or rales.  Neurological:     General: No focal deficit present.     Mental Status: She is alert  and oriented to person, place, and time.     Cranial Nerves: No cranial nerve deficit.     Motor: No weakness.  Psychiatric:        Mood and Affect: Mood normal.        Behavior: Behavior normal.     Lab Results  Component Value Date   WBC 6.5 07/17/2021   HGB 14.7 07/17/2021   HCT 41.7 07/17/2021   PLT 229 07/17/2021   GLUCOSE 124 (H) 10/20/2021   CHOL 100 07/18/2021   TRIG 136 07/18/2021   HDL 33 (L) 07/18/2021   LDLCALC 43 07/18/2021   ALT 24 10/20/2021   AST 25 10/20/2021   NA 140 10/20/2021   K 4.0 10/20/2021   CL 103 10/20/2021   CREATININE 0.69 10/20/2021   BUN 21 10/20/2021   CO2 22 10/20/2021   TSH 1.002 07/17/2021   INR 1.1 01/03/2021   HGBA1C 6.6 (H) 10/20/2021   MICROALBUR 6.5 (H) 09/26/2018     Assessment & Plan:   Problem List Items Addressed This Visit       Other   Left facial numbness - Primary    Concern for TIA or stroke. No clinical evidence of Bells Palsy. Exam today is normal. Recent labs reviewed independently by me.  MRI was obtained for further evaluation and was negative. Uncertain etiology at this time.  If recurs, will refer to Neurology.       Relevant Orders   MR Brain Wo Contrast (Completed)   Thersa Salt DO [place]

## 2021-11-24 NOTE — Assessment & Plan Note (Addendum)
Concern for TIA or stroke. No clinical evidence of Bells Palsy. Exam today is normal. Recent labs reviewed independently by me.  MRI was obtained for further evaluation and was negative. Uncertain etiology at this time.  If recurs, will refer to Neurology.

## 2021-11-28 ENCOUNTER — Telehealth: Payer: Self-pay | Admitting: *Deleted

## 2021-11-28 DIAGNOSIS — E119 Type 2 diabetes mellitus without complications: Secondary | ICD-10-CM

## 2021-11-28 DIAGNOSIS — I10 Essential (primary) hypertension: Secondary | ICD-10-CM

## 2021-11-28 NOTE — Telephone Encounter (Signed)
Patient says she has done the [facility] '5mg'$  and has done good. She says she is ready to go up to the next dose. Please advise Thank you

## 2021-11-29 ENCOUNTER — Telehealth: Payer: Self-pay | Admitting: *Deleted

## 2021-11-29 DIAGNOSIS — R200 Anesthesia of skin: Secondary | ICD-10-CM

## 2021-11-29 NOTE — Telephone Encounter (Signed)
Nurses-please talk with patient.  I would recommend further evaluation.  Recommend carotid ultrasounds because of facial numbness.  Also recommend consultation with neurology-urgent for facial numbness (Please talk with patient if there is any facial weakness I need to know this-otherwise proceed forward with the above)

## 2021-11-29 NOTE — Telephone Encounter (Signed)
Nurses-please let patient know that I do not feel the numbness that she is experiencing in the face is associated with the [facility].  I recommend Mounjaro bump up dose to 7.5 mg, may send in 1 month supply with 1 additional refill patient to give [place] feedback within 1 month how things are going.  She will need to do a follow-up office visit and lab work consisting of X2V, metabolic 7 approximately 3 months since her last A1c and office visit thank you(please calculate that out and inform patient and put orders in for the labs thank you)

## 2021-11-30 ENCOUNTER — Other Ambulatory Visit (HOSPITAL_COMMUNITY): Payer: Self-pay

## 2021-11-30 MED ORDER — TIRZEPATIDE 7.5 MG/0.5ML ~~LOC~~ SOAJ
7.5000 mg | SUBCUTANEOUS | 1 refills | Status: DC
  Filled 2021-11-30: qty 2, 28d supply, fill #0
  Filled 2022-01-23: qty 2, 28d supply, fill #1

## 2021-11-30 NOTE — Telephone Encounter (Signed)
Pt contacted and verbalized understanding. Lab orders placed. Mounjaro 7.5 has been sent to [place]. Pt has appt for 01/17/22

## 2021-11-30 NOTE — Telephone Encounter (Signed)
Pt contacted and states she has an episode on Monday that affected her speech. Pt states when the numbness happens, the weakness happens but not every time. Please advise. Thank you

## 2021-12-02 NOTE — Telephone Encounter (Signed)
I would recommend continuing the 81 mg aspirin If she ever has any facial weakness with numbness that persists more than 15 minutes I recommend ER evaluation Please let the patient know that we are trying to get a urgent consultation with neurology Please have referrals work hard at trying to get the urgent referral appointment thank you

## 2021-12-02 NOTE — Telephone Encounter (Signed)
Patient advised per Dr Nicki Reaper:  would recommend continuing the 81 mg aspirin If she ever has any facial weakness with numbness that persists more than 15 minutes  recommend ER evaluation Please let the patient know that we are trying to get a urgent consultation with neurology  Patient verbalized understanding and urgent referral ordered in Epic.

## 2021-12-27 ENCOUNTER — Ambulatory Visit: Payer: 59 | Admitting: Neurology

## 2021-12-27 ENCOUNTER — Encounter: Payer: Self-pay | Admitting: Neurology

## 2021-12-27 ENCOUNTER — Other Ambulatory Visit: Payer: Self-pay | Admitting: Family Medicine

## 2021-12-27 ENCOUNTER — Other Ambulatory Visit (HOSPITAL_COMMUNITY): Payer: Self-pay

## 2021-12-27 VITALS — BP 144/77 | HR 71 | Ht 62.0 in | Wt 194.0 lb

## 2021-12-27 DIAGNOSIS — R202 Paresthesia of skin: Secondary | ICD-10-CM | POA: Diagnosis not present

## 2021-12-27 MED ORDER — LEVETIRACETAM 500 MG PO TABS
500.0000 mg | ORAL_TABLET | Freq: Two times a day (BID) | ORAL | 6 refills | Status: DC
  Filled 2021-12-27: qty 60, 30d supply, fill #0

## 2021-12-27 NOTE — Progress Notes (Signed)
Chief Complaint  Patient presents with   New Patient (Initial Visit)    Room 60 NX Dr. Orson Aloe 2019/internal referral for left facial numbness States numbness has improved some, reports mild numbness on face, arm  and hand       ASSESSMENT AND PLAN  Amy Dean is a 56 y.o. female   Recurrent episode of sudden onset left-sided paresthesia involving left face, left arm, occasionally fusion  Localize relation to right hemisphere,  Differentiation diagnosis   include partial seizure versus TIA  MRI of brain without contrast on November 24, 2021 showed no acute abnormality  She has multiple vascular risk factors, hypertension hyperlipidemia, diabetes, obesity,  CT angiogram head and neck  EEG  Empirically treat with Keppra 500 twice a day  Document all event  Keep aspirin 81 mg daily  DIAGNOSTIC DATA (LABS, IMAGING, TESTING) - I reviewed patient records, labs, notes, testing and imaging myself where available.   MEDICAL HISTORY:  Amy Dean is a 56 year old right-handed female, seen in request by her primary care physician Dr.   Wolfgang Phoenix, Nicki Reaper A, for evaluation of left-sided paresthesia, initial evaluation was on December 27, 2021  I reviewed and summarized the referring note PMHX HTN DM-since 2015 HLD Anxiety  She reported recurrent episode of similar event, the first 1 was on July 17 evening time, she noticed left fingertip numbness, then paresthesia traveling along her left arm involving her left face, left lip numbness, slight headache, she can work her finger, but mild clumsiness, since can last about 15 minutes,  Similar episode on July 18, 20th, 21st, most severe one was on July 24, she started feeling left hand numbness, quickly spreading to left arm, left face, she was with her 53-year-old niece, when she was trying to talk with her, she felt mild confusion, word finding difficulties, difficulty getting her thoughts and words out, she denies lower  extremity involvement, symptoms last about 15 minutes  At baseline she denies any difficulties, per  Reviewed MRI of the brain without contrast on November 24, 2021 no significant abnormality, Laboratory evaluations in 2023, normal CMP, A1c 6.6, lipid panel LDL 43, normal TSH, CBC  PHYSICAL EXAM:   Vitals:   12/27/21 0930  BP: (!) 144/77  Pulse: 71  Weight: 194 lb (88 kg)  Height: '5\' 2"'$  (1.575 m)   Not recorded     Body mass index is 35.48 kg/m.  PHYSICAL EXAMNIATION:  Gen: NAD, conversant, well nourised, well groomed                     Cardiovascular: Regular rate rhythm, no peripheral edema, warm, nontender. Eyes: Conjunctivae clear without exudates or hemorrhage Neck: Supple, no carotid bruits. Pulmonary: Clear to auscultation bilaterally   NEUROLOGICAL EXAM:  MENTAL STATUS: Speech/cognition: Awake, alert, oriented to history taking and casual conversation CRANIAL NERVES: CN II: Visual fields are full to confrontation. Pupils are round equal and briskly reactive to light. CN III, IV, VI: extraocular movement are normal. No ptosis. CN V: Facial sensation is intact to light touch CN VII: Face is symmetric with normal eye closure  CN VIII: Hearing is normal to causal conversation. CN IX, X: Phonation is normal. CN XI: Head turning and shoulder shrug are intact  MOTOR: Mild fixation of left arm on rapid alternating movement,  REFLEXES: Reflexes are 2+ and symmetric at the biceps, triceps, knees, and ankles. Plantar responses are flexor.  SENSORY: Intact to light touch, pinprick and vibratory sensation are  intact in fingers and toes.  COORDINATION: There is no trunk or limb dysmetria noted.  GAIT/STANCE: Posture is normal. Gait is steady with normal steps, base, arm swing, and turning. Heel and toe walking are normal. Tandem gait is normal.  Romberg is absent.  REVIEW OF SYSTEMS:  Full 14 system review of systems performed and notable only for as above All  other review of systems were negative.   ALLERGIES: No Known Allergies  HOME MEDICATIONS: Current Outpatient Medications  Medication Sig Dispense Refill   ALPRAZolam (XANAX) 0.5 MG tablet Take 1 tablet (0.5 mg total) by mouth at bedtime as needed for sleep. 30 tablet 5   amLODipine (NORVASC) 10 MG tablet Take 1 tablet (10 mg total) by mouth daily. 90 tablet 1   aspirin 81 MG tablet Take 81 mg by mouth daily.     hydrALAZINE (APRESOLINE) 25 MG tablet Take 1 tablet (25 mg total) by mouth 2 (two) times daily. 60 tablet 2   Insulin Pen Needle (PEN NEEDLES 31GX5/16") 31G X 8 MM MISC Use daily as directed 100 each 11   lisinopril-hydrochlorothiazide (ZESTORETIC) 20-12.5 MG tablet Take 1 tablet by mouth daily for blood pressure. 90 tablet 1   metFORMIN (GLUCOPHAGE) 500 MG tablet Take 2  tablets by mouth in the morning and 1 tablet by mouth in the evening (Patient taking differently: Take 1,000 mg by mouth daily with breakfast.) 270 tablet 0   pantoprazole (PROTONIX) 40 MG tablet Take 1 tablet (40 mg total) by mouth daily for acid reflux. 90 tablet 1   rosuvastatin (CRESTOR) 40 MG tablet Take 1 tablet (40 mg total) by mouth daily. 90 tablet 1   tirzepatide (MOUNJARO) 2.5 MG/0.5ML Pen Inject 2.5 mg into the skin once a week. 2 mL 1   tirzepatide (MOUNJARO) 5 MG/0.5ML Pen Inject 5 mg into the skin once a week. 2 mL 2   tirzepatide (MOUNJARO) 7.5 MG/0.5ML Pen Inject 7.5 mg into the skin once a week. 2 mL 1   triamcinolone cream (KENALOG) 0.1 % Apply a thin amount to finger as needed when flare up occurs 45 g 1   No current facility-administered medications for this visit.    PAST MEDICAL HISTORY: Past Medical History:  Diagnosis Date   Anemia    Resolved post hysterectomy   Enteritis    Essential hypertension    Headache    Hyperlipidemia    SVT (supraventricular tachycardia) ([place])    Documented March 2023 - converted with adenosine   Type 2 diabetes mellitus ([place])     PAST SURGICAL  HISTORY: Past Surgical History:  Procedure Laterality Date   ABDOMINAL HYSTERECTOMY     CESAREAN SECTION  1982 and 1989   x2   COLONOSCOPY N/A 04/22/2015   Procedure: COLONOSCOPY;  Surgeon: Rogene Houston, MD;  [facility];  Service: Endoscopy;  Laterality: N/A;  1:00   ESOPHAGOGASTRODUODENOSCOPY (EGD) WITH PROPOFOL N/A 08/10/2021   Procedure: ESOPHAGOGASTRODUODENOSCOPY (EGD) WITH PROPOFOL;  Surgeon: Harvel Quale, MD;  Location: [facility];  Service: Gastroenterology;  Laterality: N/A;  730   EUS  10/26/2011   Procedure: UPPER ENDOSCOPIC ULTRASOUND (EUS) LINEAR;  Surgeon: Milus Banister, MD;  Location: [facility];  Service: Endoscopy;  Laterality: N/A;   HYSTEROSCOPY W/ ENDOMETRIAL ABLATION  07/22/2008   K.Harrington Challenger MD [place] N/A 01/03/2021   Procedure: APPENDECTOMY LAPAROSCOPIC;  Surgeon: Aviva Signs, MD;  Location: [facility];  Service: General;  Laterality: N/A;   SAVORY DILATION  08/10/2021   Procedure: SAVORY DILATION;  Surgeon: Montez Morita, Quillian Quince, MD;  Location: [facility];  Service: Gastroenterology;;   SUPRACERVICAL ABDOMINAL HYSTERECTOMY  03/21/2011   Procedure: HYSTERECTOMY SUPRACERVICAL ABDOMINAL;  Surgeon: Jonnie Kind, MD;  Location: [facility];  Service: Gynecology;;   TONSILLECTOMY     TUBAL LIGATION      FAMILY HISTORY: Family History  Problem Relation Age of Onset   Healthy Son    Sarcoidosis Mother    Heart attack Mother    Stroke Mother    Other Father        unsure of history   Heart attack Paternal Uncle    Hypertension Maternal Grandmother    Anesthesia problems Neg Hx    Hypotension Neg Hx    Malignant hyperthermia Neg Hx    Pseudochol deficiency Neg Hx     SOCIAL HISTORY: Social History   Socioeconomic History   Marital status: Married    Spouse name: Ane, Conerly.   Number of children: 1   Years of education: 12th grade   Highest education level: Not on file  Occupational History    Occupation: CSPDT    Employer: [employer]  Tobacco Use   Smoking status: Never    Passive exposure: Never   Smokeless tobacco: Never  Vaping Use   Vaping Use: Never used  Substance and Sexual Activity   Alcohol use: Yes    Alcohol/week: 4.0 standard drinks of alcohol    Types: 4 Cans of beer per week   Drug use: No   Sexual activity: Yes    Birth control/protection: Surgical  Other Topics Concern   Not on file  Social History Narrative   Lives with her husband,married for 33 years.   Right-handed.   Occasional soda (1-2 times per week).   Works at [employer].   Social Determinants of Health   Financial Resource Strain: Not on file  Food Insecurity: Not on file  Transportation Needs: Not on file  Physical Activity: Not on file  Stress: Not on file  Social Connections: Not on file  Intimate Partner Violence: Not on file      Marcial Pacas, M.D. Ph.D.  [facility] [address] [place], [place] [address] Ph: [phone] Fax: [phone]  CC:  Kathyrn Drown, MD [place],  [place] [address]  Kathyrn Drown, MD

## 2021-12-28 ENCOUNTER — Other Ambulatory Visit (HOSPITAL_COMMUNITY): Payer: Self-pay

## 2021-12-28 ENCOUNTER — Telehealth: Payer: Self-pay | Admitting: Neurology

## 2021-12-28 MED ORDER — ALPRAZOLAM 0.5 MG PO TABS
0.5000 mg | ORAL_TABLET | Freq: Every evening | ORAL | 5 refills | Status: DC
  Filled 2021-12-28: qty 30, 30d supply, fill #0
  Filled 2022-01-30: qty 30, 30d supply, fill #1
  Filled 2022-03-12: qty 30, 30d supply, fill #2
  Filled 2022-04-11: qty 30, 30d supply, fill #3
  Filled 2022-06-05: qty 30, 30d supply, fill #4

## 2021-12-28 NOTE — Telephone Encounter (Signed)
[facility] no auth req order sent to [facility], they will reach out to the patient to schedule.

## 2022-01-02 ENCOUNTER — Telehealth: Payer: Self-pay

## 2022-01-02 ENCOUNTER — Other Ambulatory Visit (HOSPITAL_COMMUNITY): Payer: Self-pay

## 2022-01-02 MED ORDER — TIRZEPATIDE 10 MG/0.5ML ~~LOC~~ SOAJ
10.0000 mg | SUBCUTANEOUS | 1 refills | Status: DC
  Filled 2022-01-02: qty 2, 28d supply, fill #0

## 2022-01-02 NOTE — Telephone Encounter (Signed)
Patient has been informed per drs orders and recommendations.

## 2022-01-02 NOTE — Telephone Encounter (Addendum)
Patient calls and states is currently on mounjaro 7.5 mg weekly, she states is ready to move up to the next dose to be sent to [facility].

## 2022-01-02 NOTE — Telephone Encounter (Signed)
May go ahead with 10 mg of the [facility] once weekly 1 month with 1 additional refill recommend doing her lab work before her follow-up visit

## 2022-01-04 ENCOUNTER — Other Ambulatory Visit: Payer: 59 | Admitting: *Deleted

## 2022-01-05 ENCOUNTER — Ambulatory Visit: Payer: 59 | Admitting: Neurology

## 2022-01-05 ENCOUNTER — Ambulatory Visit
Admission: RE | Admit: 2022-01-05 | Discharge: 2022-01-05 | Disposition: A | Discharge status: Home / Self Care | Payer: 59 | Source: Ambulatory Visit | Attending: Neurology | Admitting: Neurology

## 2022-01-05 DIAGNOSIS — R202 Paresthesia of skin: Secondary | ICD-10-CM

## 2022-01-05 DIAGNOSIS — R200 Anesthesia of skin: Secondary | ICD-10-CM | POA: Diagnosis not present

## 2022-01-05 MED ORDER — IOPAMIDOL (ISOVUE-370) INJECTION 76%
75.0000 mL | Freq: Once | INTRAVENOUS | Status: AC | PRN
  Administered 2022-01-05: 75 mL via INTRAVENOUS

## 2022-01-13 ENCOUNTER — Ambulatory Visit: Payer: Self-pay | Admitting: Family Medicine

## 2022-01-17 ENCOUNTER — Ambulatory Visit: Payer: 59 | Admitting: Family Medicine

## 2022-01-17 NOTE — Procedures (Signed)
   HISTORY: 56 year old female presenting with recurrent episode of sudden onset left-sided paresthesia  TECHNIQUE:  This is a routine 16 channel EEG recording with one channel devoted to a limited EKG recording.  It was performed during wakefulness, drowsiness and asleep.  Hyperventilation and photic stimulation were performed as activating procedures.  There are minimum muscle and movement artifact noted.  Upon maximum arousal, posterior dominant waking rhythm consistent of rhythmic mixed alpha and beta range activity. Activities are symmetric over the bilateral posterior derivations and attenuated with eye opening.  Hyperventilation produced mild/moderate buildup with higher amplitude and the slower activities noted.  Photic stimulation did not alter the tracing.  During EEG recording, patient developed drowsiness and entered sleep, sleep EEG demonstrated architecture, there were frontal centrally dominant vertex waves and symmetric sleep spindles noted.  During EEG recording, there was no epileptiform discharge noted.  EKG demonstrate sinus rhythm, with heart rate of 84 bpm  CONCLUSION: This is a  normal awake and asleep EEG.  The increased beta activity usually associated with benzodiazepine use, she is taking lorazepam regularly  Marcial Pacas, M.D. Ph.D.  [facility] [place], [place] [address] Phone: [phone] Fax:      [phone]

## 2022-01-20 ENCOUNTER — Ambulatory Visit: Payer: 59 | Admitting: Nurse Practitioner

## 2022-01-20 VITALS — BP 136/88 | Ht 62.0 in | Wt 190.4 lb

## 2022-01-20 DIAGNOSIS — Z23 Encounter for immunization: Secondary | ICD-10-CM

## 2022-01-20 DIAGNOSIS — S91331A Puncture wound without foreign body, right foot, initial encounter: Secondary | ICD-10-CM | POA: Diagnosis not present

## 2022-01-20 NOTE — Progress Notes (Unsigned)
   Subjective:    Patient ID: Amy Dean, female    DOB: 1966-04-02, 56 y.o.   MRN: [id number]  HPI  Patient arrives having stepped on a rusty staple. Patient states last tetanus 2000 Review of Systems     Objective:   Physical Exam        Assessment & Plan:

## 2022-01-21 ENCOUNTER — Encounter: Payer: Self-pay | Admitting: Nurse Practitioner

## 2022-01-23 ENCOUNTER — Other Ambulatory Visit (HOSPITAL_COMMUNITY): Payer: Self-pay

## 2022-01-23 ENCOUNTER — Other Ambulatory Visit: Payer: Self-pay | Admitting: Family Medicine

## 2022-01-23 MED ORDER — LISINOPRIL-HYDROCHLOROTHIAZIDE 20-12.5 MG PO TABS
1.0000 | ORAL_TABLET | Freq: Every day | ORAL | 1 refills | Status: DC
  Filled 2022-01-23: qty 90, 90d supply, fill #0

## 2022-01-23 MED ORDER — AMLODIPINE BESYLATE 10 MG PO TABS
10.0000 mg | ORAL_TABLET | Freq: Every day | ORAL | 1 refills | Status: DC
  Filled 2022-01-23: qty 90, 90d supply, fill #0
  Filled 2022-06-05: qty 90, 90d supply, fill #1

## 2022-01-23 MED ORDER — PANTOPRAZOLE SODIUM 40 MG PO TBEC
40.0000 mg | DELAYED_RELEASE_TABLET | Freq: Every day | ORAL | 1 refills | Status: DC
  Filled 2022-01-23: qty 90, 90d supply, fill #0
  Filled 2022-06-05: qty 90, 90d supply, fill #1

## 2022-01-23 MED ORDER — METFORMIN HCL 500 MG PO TABS
ORAL_TABLET | ORAL | 0 refills | Status: DC
  Filled 2022-01-23: qty 270, 90d supply, fill #0

## 2022-01-24 ENCOUNTER — Other Ambulatory Visit (HOSPITAL_COMMUNITY): Payer: Self-pay

## 2022-01-25 ENCOUNTER — Other Ambulatory Visit (HOSPITAL_COMMUNITY): Payer: Self-pay

## 2022-01-26 ENCOUNTER — Other Ambulatory Visit (HOSPITAL_COMMUNITY): Payer: Self-pay

## 2022-01-30 ENCOUNTER — Other Ambulatory Visit (HOSPITAL_COMMUNITY): Payer: Self-pay

## 2022-02-21 DIAGNOSIS — E119 Type 2 diabetes mellitus without complications: Secondary | ICD-10-CM | POA: Diagnosis not present

## 2022-02-21 DIAGNOSIS — I10 Essential (primary) hypertension: Secondary | ICD-10-CM | POA: Diagnosis not present

## 2022-02-22 LAB — BASIC METABOLIC PANEL
BUN/Creatinine Ratio: 17 (ref 9–23)
BUN: 12 mg/dL (ref 6–24)
CO2: 22 mmol/L (ref 20–29)
Calcium: 9.7 mg/dL (ref 8.7–10.2)
Chloride: 104 mmol/L (ref 96–106)
Creatinine, Ser: 0.69 mg/dL (ref 0.57–1.00)
Glucose: 115 mg/dL — ABNORMAL HIGH (ref 70–99)
Potassium: 4.1 mmol/L (ref 3.5–5.2)
Sodium: 141 mmol/L (ref 134–144)
eGFR: 101 mL/min/{1.73_m2} (ref >59)

## 2022-02-22 LAB — HEMOGLOBIN A1C
Est. average glucose Bld gHb Est-mCnc: 123 mg/dL
Hgb A1c MFr Bld: 5.9 % — ABNORMAL HIGH (ref 4.8–5.6)

## 2022-02-23 ENCOUNTER — Ambulatory Visit: Payer: 59 | Admitting: Family Medicine

## 2022-02-23 ENCOUNTER — Other Ambulatory Visit (HOSPITAL_COMMUNITY): Payer: Self-pay

## 2022-02-23 VITALS — BP 123/79 | HR 76 | Temp 97.5°F | Ht 62.0 in | Wt 189.0 lb

## 2022-02-23 DIAGNOSIS — I10 Essential (primary) hypertension: Secondary | ICD-10-CM | POA: Diagnosis not present

## 2022-02-23 DIAGNOSIS — E119 Type 2 diabetes mellitus without complications: Secondary | ICD-10-CM | POA: Diagnosis not present

## 2022-02-23 DIAGNOSIS — E1169 Type 2 diabetes mellitus with other specified complication: Secondary | ICD-10-CM

## 2022-02-23 DIAGNOSIS — E785 Hyperlipidemia, unspecified: Secondary | ICD-10-CM

## 2022-02-23 MED ORDER — LISINOPRIL-HYDROCHLOROTHIAZIDE 10-12.5 MG PO TABS
1.0000 | ORAL_TABLET | Freq: Every day | ORAL | 3 refills | Status: DC
  Filled 2022-02-23: qty 90, 90d supply, fill #0
  Filled 2022-06-05: qty 90, 90d supply, fill #1
  Filled 2022-07-25: qty 40, 40d supply, fill #2
  Filled 2022-08-29: qty 90, 90d supply, fill #0

## 2022-02-23 MED ORDER — METFORMIN HCL 500 MG PO TABS
500.0000 mg | ORAL_TABLET | Freq: Every morning | ORAL | 1 refills | Status: DC
  Filled 2022-02-23: qty 90, fill #0
  Filled 2022-06-05: qty 90, 90d supply, fill #0
  Filled 2022-07-25: qty 40, 40d supply, fill #1
  Filled 2022-08-29: qty 50, 50d supply, fill #0

## 2022-02-23 MED ORDER — ROSUVASTATIN CALCIUM 40 MG PO TABS
40.0000 mg | ORAL_TABLET | Freq: Every day | ORAL | 1 refills | Status: DC
  Filled 2022-02-23 – 2022-06-05 (×2): qty 90, 90d supply, fill #0
  Filled 2022-07-25: qty 40, 40d supply, fill #1
  Filled 2022-08-29: qty 50, 50d supply, fill #0

## 2022-02-23 MED ORDER — TIRZEPATIDE 10 MG/0.5ML ~~LOC~~ SOAJ
10.0000 mg | SUBCUTANEOUS | 1 refills | Status: DC
  Filled 2022-02-23: qty 2, 28d supply, fill #0

## 2022-02-23 NOTE — Patient Instructions (Addendum)
It was good to see you today Please do your lab work and urine test before your next visit in February  Should you have any significant health changes before then please let me know  TakeCare-Dr. Nicki Reaper  Remember we are changing the dose of the lisinopril HCTZ We are reducing the metformin down to 500 mg Mounjaro will be 10 mg weekly Please send [place] an update in the near future letting us know how that is going

## 2022-02-23 NOTE — Progress Notes (Signed)
   Subjective:    Patient ID: Amy Dean, female    DOB: 1966/04/23, 56 y.o.   MRN: [id number]  HPI Patient suffers with insomnia.  This is been going on for a while.  The patient finds it necessary to use medication to help sleep.  Patient finds it if not using medication has significant troubles.  Denies abusing the medication.  Denies any negative side effects.  The patient was seen today as part of a comprehensive diabetic check up. Patient has diabetes Patient relates good compliance with taking the medication. We discussed their diet and exercise activities  We also discussed the importance of notifying us if any excessively high glucoses or low sugars.    Patient for blood pressure check up.  The patient does have hypertension.   Patient relates dietary measures try to minimize salt The importance of healthy diet and activity were discussed Patient relates compliance  Patient here for follow-up regarding cholesterol.    Patient relates taking medication on a regular basis Denies problems with medication Importance of dietary measures discussed Regular lab work regarding lipid and liver was checked and if needing additional labs was appropriately ordered    Review of Systems     Objective:   Physical Exam General-in no acute distress Eyes-no discharge Lungs-respiratory rate normal, CTA CV-no murmurs,RRR Extremities skin warm dry no edema Neuro grossly normal Behavior normal, alert        Assessment & Plan:  1. Essential hypertension Blood pressure good control continue current measures - Basic metabolic panel - Lipid panel - Hemoglobin A1c - Hepatic Function Panel - Microalbumin/Creatinine Ratio, Urine  I believe the lisinopril is helpful for her but we will reduce the dose patient had concerns of medicine this was discussed in detail  2. Controlled type 2 diabetes mellitus without complication, without long-term current use of insulin  (HCC) Diabetes very good control patient would like to continuously go up on the [facility] to try to help weight come down further and keep A1c good reduction of medicines were able to accomplish today - Basic metabolic panel - Lipid panel - Hemoglobin A1c - Hepatic Function Panel - Microalbumin/Creatinine Ratio, Urine  3. Hyperlipidemia associated with type 2 diabetes mellitus (HCC) Continue statin continue healthy diet keep LDL low - Basic metabolic panel - Lipid panel - Hemoglobin A1c - Hepatic Function Panel - Microalbumin/Creatinine Ratio, Urine  Moderate obesity almost morbid obesity healthy diet portion control plus Mounjaro  Follow-up in 4 months lab work before that visit

## 2022-03-13 ENCOUNTER — Other Ambulatory Visit (HOSPITAL_COMMUNITY): Payer: Self-pay

## 2022-03-13 ENCOUNTER — Ambulatory Visit: Payer: 59 | Admitting: Family Medicine

## 2022-03-13 VITALS — BP 128/62 | HR 86 | Temp 97.3°F | Ht 62.0 in | Wt 185.0 lb

## 2022-03-13 DIAGNOSIS — I493 Ventricular premature depolarization: Secondary | ICD-10-CM | POA: Insufficient documentation

## 2022-03-13 DIAGNOSIS — R002 Palpitations: Secondary | ICD-10-CM | POA: Insufficient documentation

## 2022-03-13 NOTE — Assessment & Plan Note (Signed)
Patient experiencing PVCs.  Advised of the benign nature of PVCs.  Advised watchful waiting.  If continues to persist and be troublesome, will start on low-dose beta-blocker.

## 2022-03-13 NOTE — Progress Notes (Signed)
Subjective:  Patient ID: Amy Dean, female    DOB: 1966-01-08  Age: 56 y.o. MRN: [id number]  CC: Chief Complaint  Patient presents with   Palpitations    SOB, dizzyness this morning , had it checked this am 135/88 pulse 108    HPI:  56 year old female with hypertension, GERD, type 2 diabetes, hyperlipidemia presents for evaluation of the above.  Patient states that earlier this morning she awoke and was experiencing palpitations.  She reports feeling like she was breathing heavily.  Associated dizziness.  She works at the hospital.  She states that she was placed on a monitor and her heart rate was elevated at 108.  Currently her heart rate is 86.  Not experiencing tachycardia or palpitations currently.  No chest pain.  No other associated symptoms.  No other complaints.  Patient Active Problem List   Diagnosis Date Noted   Palpitations 03/13/2022   PVC (premature ventricular contraction) 03/13/2022   Paresthesia 12/27/2021   Left facial numbness 11/24/2021   Mild concentric left ventricular hypertrophy (LVH) 07/18/2021   Dyshidrotic eczema 08/10/2020   Hyperlipidemia associated with type 2 diabetes mellitus ([place]) 04/01/2014   Depression with anxiety 01/05/2014   Gastroesophageal reflux disease 12/18/2011   Diabetes mellitus type II, controlled ([place]) 03/23/2011    Class: Chronic   Essential hypertension 12/10/2008    Social Hx   Social History   Socioeconomic History   Marital status: Married    Spouse name: Herminia, Warren.   Number of children: 1   Years of education: 12th grade   Highest education level: Not on file  Occupational History   Occupation: CSPDT    Employer: [employer]  Tobacco Use   Smoking status: Never    Passive exposure: Never   Smokeless tobacco: Never  Vaping Use   Vaping Use: Never used  Substance and Sexual Activity   Alcohol use: Yes    Alcohol/week: 4.0 standard drinks of alcohol    Types: 4 Cans of beer per week    Drug use: No   Sexual activity: Yes    Birth control/protection: Surgical  Other Topics Concern   Not on file  Social History Narrative   Lives with her husband,married for 33 years.   Right-handed.   Occasional soda (1-2 times per week).   Works at [employer].   Social Determinants of Health   Financial Resource Strain: Not on file  Food Insecurity: Not on file  Transportation Needs: Not on file  Physical Activity: Not on file  Stress: Not on file  Social Connections: Not on file    Review of Systems Per HPI  Objective:  BP 128/62   Pulse 86   Temp (!) 97.3 F (36.3 C)   Ht '5\' 2"'$  (1.575 m)   Wt 185 lb (83.9 kg)   LMP 02/10/2011   SpO2 98%   BMI 33.84 kg/m      03/13/2022    1:07 PM 02/23/2022    8:21 AM 01/20/2022    4:01 PM  BP/Weight  Systolic BP 182 993 716  Diastolic BP 62 79 88  Wt. (Lbs) 185 189 190.4  BMI 33.84 kg/m2 34.57 kg/m2 34.82 kg/m2    Physical Exam Vitals and nursing note reviewed.  Constitutional:      General: She is not in acute distress.    Appearance: Normal appearance. She is obese.  HENT:     Head: Normocephalic and atraumatic.  Eyes:  General:        Right eye: No discharge.        Left eye: No discharge.     Conjunctiva/sclera: Conjunctivae normal.  Cardiovascular:     Rate and Rhythm: Normal rate and regular rhythm.     Heart sounds: No murmur heard. Pulmonary:     Effort: Pulmonary effort is normal.     Breath sounds: Normal breath sounds. No wheezing, rhonchi or rales.  Neurological:     Mental Status: She is alert.  Psychiatric:        Mood and Affect: Mood normal.        Behavior: Behavior normal.     Lab Results  Component Value Date   WBC 6.5 07/17/2021   HGB 14.7 07/17/2021   HCT 41.7 07/17/2021   PLT 229 07/17/2021   GLUCOSE 115 (H) 02/21/2022   CHOL 100 07/18/2021   TRIG 136 07/18/2021   HDL 33 (L) 07/18/2021   LDLCALC 43 07/18/2021   ALT 24 10/20/2021   AST 25 10/20/2021   NA  141 02/21/2022   K 4.1 02/21/2022   CL 104 02/21/2022   CREATININE 0.69 02/21/2022   BUN 12 02/21/2022   CO2 22 02/21/2022   TSH 1.002 07/17/2021   INR 1.1 01/03/2021   HGBA1C 5.9 (H) 02/21/2022   MICROALBUR 6.5 (H) 09/26/2018   EKG: Interpretation-normal sinus rhythm with PVCs.  Left axis.  Nonspecific T wave changes.  Assessment & Plan:   Problem List Items Addressed This Visit       Cardiovascular and Mediastinum   PVC (premature ventricular contraction) - Primary    Patient experiencing PVCs.  Advised of the benign nature of PVCs.  Advised watchful waiting.  If continues to persist and be troublesome, will start on low-dose beta-blocker.        Other   Palpitations   Relevant Orders   EKG 12-Lead (Completed)   Dene Landsberg Lacinda Axon DO [place]

## 2022-03-20 ENCOUNTER — Other Ambulatory Visit (HOSPITAL_COMMUNITY): Payer: Self-pay | Admitting: Family Medicine

## 2022-03-20 DIAGNOSIS — Z1231 Encounter for screening mammogram for malignant neoplasm of breast: Secondary | ICD-10-CM

## 2022-03-28 ENCOUNTER — Other Ambulatory Visit (HOSPITAL_COMMUNITY): Payer: Self-pay

## 2022-03-28 ENCOUNTER — Telehealth: Payer: Self-pay

## 2022-03-28 ENCOUNTER — Other Ambulatory Visit: Payer: Self-pay

## 2022-03-28 DIAGNOSIS — E119 Type 2 diabetes mellitus without complications: Secondary | ICD-10-CM

## 2022-03-28 MED ORDER — TIRZEPATIDE 12.5 MG/0.5ML ~~LOC~~ SOAJ
12.5000 mg | SUBCUTANEOUS | 3 refills | Status: DC
  Filled 2022-03-28: qty 2, 28d supply, fill #0
  Filled 2022-04-25 (×2): qty 2, 28d supply, fill #1
  Filled 2022-06-05: qty 2, 28d supply, fill #2
  Filled 2022-06-26: qty 2, 28d supply, fill #0

## 2022-03-28 NOTE — Telephone Encounter (Signed)
She may go up to 12.5 mg subcutaneous once per week, 1 month supply, 3 refills, keep follow-up visit in February and do blood work before that follow-up visit

## 2022-03-28 NOTE — Telephone Encounter (Signed)
Patient states is on mounjaro 10 mg currently , she is requesting to go up on her dose to 10.5 mg to be sent to [facility], please advise.

## 2022-03-28 NOTE — Telephone Encounter (Signed)
Patient has been informed per drs orders for new mounjaro dosage.

## 2022-04-11 ENCOUNTER — Other Ambulatory Visit (HOSPITAL_COMMUNITY): Payer: Self-pay

## 2022-04-12 ENCOUNTER — Other Ambulatory Visit (HOSPITAL_COMMUNITY): Payer: Self-pay

## 2022-04-25 ENCOUNTER — Other Ambulatory Visit (HOSPITAL_COMMUNITY): Payer: Self-pay

## 2022-04-25 ENCOUNTER — Telehealth: Payer: Self-pay | Admitting: Family Medicine

## 2022-04-25 NOTE — Telephone Encounter (Signed)
Patient is requesting refill on Mojarro to be sent to [place].

## 2022-04-25 NOTE — Telephone Encounter (Signed)
Pt currently on 12.5 mg. 3 refills sent in November. Contacted pt to make her aware that she should be able to get refill at pharmacy. Pt verbalized understanding.

## 2022-04-28 ENCOUNTER — Ambulatory Visit (HOSPITAL_COMMUNITY)
Admission: RE | Admit: 2022-04-28 | Discharge: 2022-04-28 | Disposition: A | Discharge status: Home / Self Care | Payer: 59 | Source: Ambulatory Visit | Attending: Family Medicine | Admitting: Family Medicine

## 2022-04-28 DIAGNOSIS — Z1231 Encounter for screening mammogram for malignant neoplasm of breast: Secondary | ICD-10-CM | POA: Diagnosis not present

## 2022-05-03 ENCOUNTER — Ambulatory Visit: Payer: 59 | Admitting: Adult Health

## 2022-05-18 DIAGNOSIS — E119 Type 2 diabetes mellitus without complications: Secondary | ICD-10-CM | POA: Diagnosis not present

## 2022-05-18 LAB — HM DIABETES EYE EXAM

## 2022-06-05 ENCOUNTER — Other Ambulatory Visit (HOSPITAL_COMMUNITY): Payer: Self-pay

## 2022-06-06 ENCOUNTER — Other Ambulatory Visit: Payer: Self-pay

## 2022-06-06 ENCOUNTER — Other Ambulatory Visit (HOSPITAL_COMMUNITY): Payer: Self-pay

## 2022-06-07 ENCOUNTER — Other Ambulatory Visit (HOSPITAL_COMMUNITY): Payer: Self-pay

## 2022-06-07 ENCOUNTER — Encounter: Payer: Self-pay | Admitting: *Deleted

## 2022-06-23 DIAGNOSIS — I10 Essential (primary) hypertension: Secondary | ICD-10-CM | POA: Diagnosis not present

## 2022-06-23 DIAGNOSIS — E1169 Type 2 diabetes mellitus with other specified complication: Secondary | ICD-10-CM | POA: Diagnosis not present

## 2022-06-23 DIAGNOSIS — E119 Type 2 diabetes mellitus without complications: Secondary | ICD-10-CM | POA: Diagnosis not present

## 2022-06-23 DIAGNOSIS — E785 Hyperlipidemia, unspecified: Secondary | ICD-10-CM | POA: Diagnosis not present

## 2022-06-25 LAB — LIPID PANEL
Chol/HDL Ratio: 2.2 ratio (ref 0.0–4.4)
Cholesterol, Total: 129 mg/dL (ref 100–199)
HDL: 58 mg/dL (ref >39)
LDL Chol Calc (NIH): 55 mg/dL (ref 0–99)
Triglycerides: 83 mg/dL (ref 0–149)
VLDL Cholesterol Cal: 16 mg/dL (ref 5–40)

## 2022-06-25 LAB — MICROALBUMIN / CREATININE URINE RATIO
Creatinine, Urine: 223.6 mg/dL
Microalb/Creat Ratio: 6 mg/g creat (ref 0–29)
Microalbumin, Urine: 13.4 ug/mL

## 2022-06-25 LAB — BASIC METABOLIC PANEL
BUN/Creatinine Ratio: 21 (ref 9–23)
BUN: 14 mg/dL (ref 6–24)
CO2: 24 mmol/L (ref 20–29)
Calcium: 9.8 mg/dL (ref 8.7–10.2)
Chloride: 101 mmol/L (ref 96–106)
Creatinine, Ser: 0.68 mg/dL (ref 0.57–1.00)
Glucose: 101 mg/dL — ABNORMAL HIGH (ref 70–99)
Potassium: 3.8 mmol/L (ref 3.5–5.2)
Sodium: 140 mmol/L (ref 134–144)
eGFR: 102 mL/min/{1.73_m2} (ref >59)

## 2022-06-25 LAB — HEMOGLOBIN A1C
Est. average glucose Bld gHb Est-mCnc: 117 mg/dL
Hgb A1c MFr Bld: 5.7 % — ABNORMAL HIGH (ref 4.8–5.6)

## 2022-06-25 LAB — HEPATIC FUNCTION PANEL
ALT: 27 IU/L (ref 0–32)
AST: 31 IU/L (ref 0–40)
Albumin: 5 g/dL — ABNORMAL HIGH (ref 3.8–4.9)
Alkaline Phosphatase: 66 IU/L (ref 44–121)
Bilirubin Total: 0.5 mg/dL (ref 0.0–1.2)
Bilirubin, Direct: 0.15 mg/dL (ref 0.00–0.40)
Total Protein: 7.6 g/dL (ref 6.0–8.5)

## 2022-06-26 ENCOUNTER — Other Ambulatory Visit (HOSPITAL_COMMUNITY): Payer: Self-pay

## 2022-06-26 ENCOUNTER — Other Ambulatory Visit: Payer: Self-pay

## 2022-06-27 ENCOUNTER — Encounter: Payer: Self-pay | Admitting: Family Medicine

## 2022-06-27 ENCOUNTER — Ambulatory Visit: Payer: Commercial Managed Care - PPO | Admitting: Family Medicine

## 2022-06-27 ENCOUNTER — Other Ambulatory Visit (HOSPITAL_COMMUNITY): Payer: Self-pay

## 2022-06-27 VITALS — BP 132/82 | Wt 174.0 lb

## 2022-06-27 DIAGNOSIS — E1169 Type 2 diabetes mellitus with other specified complication: Secondary | ICD-10-CM | POA: Diagnosis not present

## 2022-06-27 DIAGNOSIS — E66811 Obesity, class 1: Secondary | ICD-10-CM

## 2022-06-27 DIAGNOSIS — E669 Obesity, unspecified: Secondary | ICD-10-CM | POA: Diagnosis not present

## 2022-06-27 DIAGNOSIS — E785 Hyperlipidemia, unspecified: Secondary | ICD-10-CM

## 2022-06-27 DIAGNOSIS — E119 Type 2 diabetes mellitus without complications: Secondary | ICD-10-CM

## 2022-06-27 MED ORDER — TIRZEPATIDE 15 MG/0.5ML ~~LOC~~ SOAJ
15.0000 mg | SUBCUTANEOUS | 6 refills | Status: DC
  Filled 2022-06-27 – 2022-07-25 (×2): qty 2, 28d supply, fill #0

## 2022-06-27 NOTE — Addendum Note (Signed)
Addended by: Vicente Males on: 06/27/2022 10:13 AM   Modules accepted: Orders

## 2022-06-27 NOTE — Progress Notes (Signed)
Lab orders placed.  

## 2022-06-27 NOTE — Progress Notes (Signed)
   Subjective:    Patient ID: Amy Dean, female    DOB: [birth year], 57 y.o.   MRN: [id number]  HPI Pt arrives for follow up. Blood pressure doing well per patient. No s/s of HTN. Taking Norvasc 10 mg.  Urine micro protein normal Kidney function was normal electrolytes look good LDL 55 HDL 58 A1c looks great at 5.7 Liver enzymes look very good Patient is working hard at healthy eating regular physical activity her weight is coming down she would like to see more weight loss she is plateaued around 174 She works full-time stress levels are reasonable Pt doing well on Mounjaro 12.5 mg She does have a difficult time sleeping at nighttime.  Alprazolam helps her go to sleep but she stays asleep 4-1/2 to 5 hours and wakes up Patient for blood pressure check up.  The patient does have hypertension.   Patient relates dietary measures try to minimize salt The importance of healthy diet and activity were discussed Patient relates compliance  Patient here for follow-up regarding cholesterol.    Patient relates taking medication on a regular basis Denies problems with medication Importance of dietary measures discussed Regular lab work regarding lipid and liver was checked and if needing additional labs was appropriately ordered  Review of Systems     Objective:   Physical Exam General-in no acute distress Eyes-no discharge Lungs-respiratory rate normal, CTA CV-no murmurs,RRR Extremities skin warm dry no edema Neuro grossly normal Behavior normal, alert  Results for orders placed or performed in visit on 06/07/22  HM DIABETES EYE EXAM  Result Value Ref Range   HM Diabetic Eye Exam No Retinopathy No Retinopathy         Assessment & Plan:  Her lab work looks very good The patient was seen today as part of a comprehensive visit for diabetes. The importance of keeping her A1c at or below 7 range was discussed.  Discussed diet, activity, and medication  compliance Emphasized healthy eating primarily with vegetables fruits and if utilizing meats lean meats such as chicken or fish grilled baked broiled Avoid sugary drinks Minimize and avoid processed foods Fit in regular physical activity preferably 25 to 30 minutes 4 times per week Standard follow-up visit recommended.  Patient aware lack of control and follow-up increases risk of diabetic complications. Regular follow-up visits Yearly ophthalmology Yearly foot exam  After shared discussion she would like to go up on the [facility] to 15 mg she would like to see if that will help her with weight reduction along with keeping diabetes under control if she finds that her symptoms side effects are bad she will let us know HTN- patient seen for follow-up regarding HTN.   Diet, medication compliance, appropriate labs and refills were completed.   Importance of keeping blood pressure under good control to lessen the risk of complications discussed Regular follow-up visits discussed  Hyperlipidemia-importance of diet, weight control, activity, compliance with medications discussed.   Recent labs reviewed.   Any additional labs or refills ordered.   Importance of keeping under good control discussed. Regular follow-up visits discussed  Insomnia issues recommend that she may continue the alprazolam at nighttime but when she wakes up 4 to have 5 hours later this is a common finding for older adults I do not recommend additional medicines behavioral techniques were discussed in detail  Follow-up in approximately 6 months lab work before that visit

## 2022-06-28 ENCOUNTER — Other Ambulatory Visit (HOSPITAL_COMMUNITY): Payer: Self-pay

## 2022-06-29 ENCOUNTER — Other Ambulatory Visit (HOSPITAL_COMMUNITY): Payer: Self-pay

## 2022-07-07 ENCOUNTER — Other Ambulatory Visit (HOSPITAL_COMMUNITY): Payer: Self-pay

## 2022-07-10 ENCOUNTER — Other Ambulatory Visit: Payer: Self-pay | Admitting: Family Medicine

## 2022-07-13 ENCOUNTER — Other Ambulatory Visit (HOSPITAL_COMMUNITY): Payer: Self-pay

## 2022-07-13 MED ORDER — ALPRAZOLAM 0.5 MG PO TABS
0.5000 mg | ORAL_TABLET | Freq: Every evening | ORAL | 5 refills | Status: DC
  Filled 2022-07-13: qty 30, 30d supply, fill #0
  Filled 2022-07-25: qty 30, 30d supply, fill #1

## 2022-07-17 ENCOUNTER — Other Ambulatory Visit (HOSPITAL_COMMUNITY): Payer: Self-pay

## 2022-07-25 ENCOUNTER — Telehealth: Payer: Self-pay | Admitting: Family Medicine

## 2022-07-25 ENCOUNTER — Other Ambulatory Visit (HOSPITAL_COMMUNITY): Payer: Self-pay

## 2022-07-25 ENCOUNTER — Other Ambulatory Visit: Payer: Self-pay

## 2022-07-25 ENCOUNTER — Other Ambulatory Visit: Payer: Self-pay | Admitting: Family Medicine

## 2022-07-25 MED ORDER — PANTOPRAZOLE SODIUM 40 MG PO TBEC
40.0000 mg | DELAYED_RELEASE_TABLET | Freq: Every day | ORAL | 1 refills | Status: DC
  Filled 2022-07-25: qty 40, 40d supply, fill #0
  Filled 2022-07-25 – 2022-08-29 (×2): qty 90, 90d supply, fill #0

## 2022-07-25 MED ORDER — ALPRAZOLAM 0.5 MG PO TABS
0.5000 mg | ORAL_TABLET | Freq: Every evening | ORAL | 5 refills | Status: DC
  Filled 2022-07-25 – 2022-08-29 (×3): qty 30, 30d supply, fill #0

## 2022-07-25 MED ORDER — AMLODIPINE BESYLATE 10 MG PO TABS
10.0000 mg | ORAL_TABLET | Freq: Every day | ORAL | 1 refills | Status: DC
  Filled 2022-07-25: qty 40, 40d supply, fill #0
  Filled 2022-07-25 – 2022-08-29 (×2): qty 90, 90d supply, fill #0

## 2022-07-25 NOTE — Telephone Encounter (Signed)
I believe refills of this was sent in today-I received a request from pharmacy
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
## 2022-07-26 ENCOUNTER — Other Ambulatory Visit (HOSPITAL_COMMUNITY): Payer: Self-pay

## 2022-07-26 MED ORDER — TRIAMCINOLONE ACETONIDE 0.1 % EX CREA
TOPICAL_CREAM | CUTANEOUS | 1 refills | Status: DC
  Filled 2022-07-26: qty 45, 7d supply, fill #0
  Filled 2022-11-16 – 2022-11-20 (×2): qty 45, 7d supply, fill #1

## 2022-07-26 NOTE — Telephone Encounter (Signed)
Patient notified that Rx were sent to the pharmacy.

## 2022-07-27 ENCOUNTER — Other Ambulatory Visit (HOSPITAL_COMMUNITY): Payer: Self-pay

## 2022-08-01 ENCOUNTER — Other Ambulatory Visit (HOSPITAL_COMMUNITY): Payer: Self-pay

## 2022-08-03 ENCOUNTER — Other Ambulatory Visit: Payer: Self-pay | Admitting: Family Medicine

## 2022-08-03 ENCOUNTER — Other Ambulatory Visit (HOSPITAL_COMMUNITY): Payer: Self-pay

## 2022-08-03 ENCOUNTER — Other Ambulatory Visit: Payer: Self-pay

## 2022-08-03 ENCOUNTER — Telehealth: Payer: Self-pay

## 2022-08-03 ENCOUNTER — Encounter (HOSPITAL_COMMUNITY): Payer: Self-pay

## 2022-08-03 ENCOUNTER — Emergency Department (HOSPITAL_COMMUNITY): Payer: Commercial Managed Care - PPO

## 2022-08-03 ENCOUNTER — Emergency Department (HOSPITAL_COMMUNITY)
Admission: EM | Admit: 2022-08-03 | Discharge: 2022-08-03 | Disposition: A | Discharge status: Home / Self Care | Payer: Commercial Managed Care - PPO | Attending: Emergency Medicine | Admitting: Emergency Medicine

## 2022-08-03 DIAGNOSIS — Z7982 Long term (current) use of aspirin: Secondary | ICD-10-CM | POA: Insufficient documentation

## 2022-08-03 DIAGNOSIS — Z79899 Other long term (current) drug therapy: Secondary | ICD-10-CM | POA: Insufficient documentation

## 2022-08-03 DIAGNOSIS — K1121 Acute sialoadenitis: Secondary | ICD-10-CM

## 2022-08-03 DIAGNOSIS — E119 Type 2 diabetes mellitus without complications: Secondary | ICD-10-CM | POA: Insufficient documentation

## 2022-08-03 DIAGNOSIS — I10 Essential (primary) hypertension: Secondary | ICD-10-CM | POA: Diagnosis not present

## 2022-08-03 DIAGNOSIS — Z7984 Long term (current) use of oral hypoglycemic drugs: Secondary | ICD-10-CM | POA: Diagnosis not present

## 2022-08-03 DIAGNOSIS — R221 Localized swelling, mass and lump, neck: Secondary | ICD-10-CM | POA: Insufficient documentation

## 2022-08-03 DIAGNOSIS — Z794 Long term (current) use of insulin: Secondary | ICD-10-CM | POA: Diagnosis not present

## 2022-08-03 DIAGNOSIS — K1120 Sialoadenitis, unspecified: Secondary | ICD-10-CM | POA: Diagnosis not present

## 2022-08-03 LAB — CBC WITH DIFFERENTIAL/PLATELET
Abs Immature Granulocytes: 0.01 10*3/uL (ref 0.00–0.07)
Basophils Absolute: 0 10*3/uL (ref 0.0–0.1)
Basophils Relative: 0 %
Eosinophils Absolute: 0.1 10*3/uL (ref 0.0–0.5)
Eosinophils Relative: 2 %
HCT: 35.5 % — ABNORMAL LOW (ref 36.0–46.0)
Hemoglobin: 12.1 g/dL (ref 12.0–15.0)
Immature Granulocytes: 0 %
Lymphocytes Relative: 30 %
Lymphs Abs: 1.6 10*3/uL (ref 0.7–4.0)
MCH: 30.8 pg (ref 26.0–34.0)
MCHC: 34.1 g/dL (ref 30.0–36.0)
MCV: 90.3 fL (ref 80.0–100.0)
Monocytes Absolute: 0.4 10*3/uL (ref 0.1–1.0)
Monocytes Relative: 7 %
Neutro Abs: 3.2 10*3/uL (ref 1.7–7.7)
Neutrophils Relative %: 61 %
Platelets: 168 10*3/uL (ref 150–400)
RBC: 3.93 MIL/uL (ref 3.87–5.11)
RDW: 14 % (ref 11.5–15.5)
WBC: 5.4 10*3/uL (ref 4.0–10.5)
nRBC: 0 % (ref 0.0–0.2)

## 2022-08-03 LAB — GROUP A STREP BY PCR: Group A Strep by PCR: NOT DETECTED

## 2022-08-03 LAB — BASIC METABOLIC PANEL
Anion gap: 9 (ref 5–15)
BUN: 17 mg/dL (ref 6–20)
CO2: 24 mmol/L (ref 22–32)
Calcium: 9.3 mg/dL (ref 8.9–10.3)
Chloride: 104 mmol/L (ref 98–111)
Creatinine, Ser: 0.68 mg/dL (ref 0.44–1.00)
GFR, Estimated: >60 mL/min (ref >60)
Glucose, Bld: 104 mg/dL — ABNORMAL HIGH (ref 70–99)
Potassium: 3.5 mmol/L (ref 3.5–5.1)
Sodium: 137 mmol/L (ref 135–145)

## 2022-08-03 MED ORDER — TIRZEPATIDE 10 MG/0.5ML ~~LOC~~ SOAJ
10.0000 mg | SUBCUTANEOUS | 3 refills | Status: DC
  Filled 2022-08-03 – 2022-08-08 (×2): qty 2, 28d supply, fill #0
  Filled 2022-08-29 – 2022-09-04 (×2): qty 2, 28d supply, fill #1
  Filled 2022-12-11: qty 2, 28d supply, fill #2

## 2022-08-03 MED ORDER — CLINDAMYCIN HCL 300 MG PO CAPS: 300.0000 mg | ORAL_CAPSULE | Freq: Three times a day (TID) | ORAL | 0 refills | Status: AC

## 2022-08-03 MED ORDER — IOHEXOL 300 MG/ML  SOLN
75.0000 mL | Freq: Once | INTRAMUSCULAR | Status: AC | PRN
  Administered 2022-08-03: 75 mL via INTRAVENOUS

## 2022-08-03 MED ORDER — KETOROLAC TROMETHAMINE 30 MG/ML IJ SOLN
30.0000 mg | Freq: Once | INTRAMUSCULAR | Status: AC
  Administered 2022-08-03: 30 mg via INTRAVENOUS
  Filled 2022-08-03: qty 1

## 2022-08-03 MED ORDER — CLINDAMYCIN HCL 150 MG PO CAPS
300.0000 mg | ORAL_CAPSULE | Freq: Once | ORAL | Status: AC
  Administered 2022-08-03: 300 mg via ORAL
  Filled 2022-08-03: qty 2

## 2022-08-03 MED ORDER — NAPROXEN 375 MG PO TABS: 375.0000 mg | ORAL_TABLET | Freq: Two times a day (BID) | ORAL | 0 refills | Status: DC

## 2022-08-03 NOTE — Discharge Instructions (Addendum)
Contact a health care provider if: You have a fever or chills. You have new symptoms. Your symptoms get worse. Your symptoms do not improve with treatment. Get help right away if: You have difficulty breathing or swallowing because of the swollen gland. These symptoms may represent a serious problem that is an emergency. Do not wait to see if the symptoms will go away. Get medical help right away. Call your local emergency services (911 in the [place] Do not drive yourself to the hospital.

## 2022-08-03 NOTE — Telephone Encounter (Signed)
Pt is calling [facility] has 10 mg tirzepatiede only and wants to know can Dr Nicki Reaper adjust to this Mg so she can still get the medication   Lidie call back [phone]

## 2022-08-03 NOTE — Telephone Encounter (Signed)
Patient is requesting that her irzepatide [facility]) 10 MG/0.5ML Pen  be sent to [place]  due to [name] is out.

## 2022-08-03 NOTE — Telephone Encounter (Signed)
Prescription was sent there as requested

## 2022-08-03 NOTE — ED Triage Notes (Signed)
Pt presents with sore throat and swelling to the L side of her neck that is tender. Airway is intact and no difficulty swallowing. Denies fever. Pt took Excedrin approx 30 min PTA

## 2022-08-03 NOTE — ED Provider Notes (Signed)
[facility] Provider Note   CSN: [id number] Arrival date & time: 08/03/22  2027     History  Chief Complaint  Patient presents with   Sore Throat         Amy Dean is a 57 y.o. female who presents emergency department chief complaint of neck swelling.  Patient states that this morning when she was eating breakfast she felt a twinge in her left mouth she describes as "like when you eat sour candy."  Patient states that she was sore in the area and then when she got home she noticed that her neck and area under her left mandible were extremely swollen.  She has not had a fever and she denies any recent sore throat or tonsillitis.  She denies any nausea vomiting fevers or neck stiffness.  She has comorbidities that include hypertension hyperlipidemia and diabetes.   Sore Throat       Home Medications Prior to Admission medications   Medication Sig Start Date End Date Taking? Authorizing Provider  ALPRAZolam Duanne Moron) 0.5 MG tablet Take 1 tablet (0.5 mg total) by mouth at bedtime as needed for sleep. 07/25/22   Kathyrn Drown, MD  amLODipine (NORVASC) 10 MG tablet Take 1 tablet (10 mg total) by mouth daily. 07/25/22   Kathyrn Drown, MD  aspirin 81 MG tablet Take 81 mg by mouth daily.    [provider]  Insulin Pen Needle (PEN NEEDLES 31GX5/16") 31G X 8 MM MISC Use daily as directed 07/16/13   Nilda Simmer, NP  levETIRAcetam (KEPPRA) 500 MG tablet Take 1 tablet (500 mg total) by mouth 2 (two) times daily. 12/27/21   Marcial Pacas, MD  lisinopril-hydrochlorothiazide (ZESTORETIC) 10-12.5 MG tablet Take 1 tablet by mouth daily. 02/23/22   Kathyrn Drown, MD  metFORMIN (GLUCOPHAGE) 500 MG tablet Take 1 tablet (500 mg total) by mouth in the morning. 02/23/22   Kathyrn Drown, MD  pantoprazole (PROTONIX) 40 MG tablet Take 1 tablet (40 mg total) by mouth daily for acid reflux. 07/25/22   Kathyrn Drown, MD  rosuvastatin  (CRESTOR) 40 MG tablet Take 1 tablet (40 mg total) by mouth daily. 02/23/22   Kathyrn Drown, MD  tirzepatide [facility]) 10 MG/0.5ML Pen Inject 10 mg into the skin once a week. 08/03/22   Kathyrn Drown, MD  triamcinolone cream (KENALOG) 0.1 % Apply a thin amount to finger as needed when flare up occurs 07/26/22   Kathyrn Drown, MD      Allergies    Patient has no known allergies.    Review of Systems   Review of Systems  Physical Exam Updated Vital Signs BP (!) 154/79 (BP Location: Left Arm)   Pulse 68   Temp 98.2 F (36.8 C) (Oral)   Resp 20   Ht 5\' 2"  (1.575 m)   Wt 77.6 kg   LMP 02/10/2011   SpO2 99%   BMI 31.28 kg/m  Physical Exam Vitals and nursing note reviewed.  Constitutional:      General: She is not in acute distress.    Appearance: She is well-developed. She is not diaphoretic.  HENT:     Head: Normocephalic and atraumatic.     Jaw: Pain on movement present. No trismus or malocclusion.     Right Ear: External ear normal.     Left Ear: External ear normal.     Nose: Nose normal.     Mouth/Throat:  Mouth: Mucous membranes are moist.     Dentition: Normal dentition. No dental tenderness, gingival swelling or dental caries.  Eyes:     General: No scleral icterus.    Conjunctiva/sclera: Conjunctivae normal.  Neck:   Cardiovascular:     Rate and Rhythm: Normal rate and regular rhythm.     Heart sounds: Normal heart sounds. No murmur heard.    No friction rub. No gallop.  Pulmonary:     Effort: Pulmonary effort is normal. No respiratory distress.     Breath sounds: Normal breath sounds.  Abdominal:     General: Bowel sounds are normal. There is no distension.     Palpations: Abdomen is soft. There is no mass.     Tenderness: There is no abdominal tenderness. There is no guarding.  Musculoskeletal:     Cervical back: Normal range of motion.  Skin:    General: Skin is warm and dry.  Neurological:     Mental Status: She is alert and oriented to  person, place, and time.  Psychiatric:        Behavior: Behavior normal.     ED Results / Procedures / Treatments   Labs (all labs ordered are listed, but only abnormal results are displayed) Labs Reviewed  CBC WITH DIFFERENTIAL/PLATELET - Abnormal; Notable for the following components:      Result Value   HCT 35.5 (*)    All other components within normal limits  GROUP A STREP BY PCR  BASIC METABOLIC PANEL    EKG None  Radiology No results found.  Procedures Procedures    Medications Ordered in ED Medications - No data to display  ED Course/ Medical Decision Making/ A&P                             Medical Decision Making This is a 57 year old female who presents emergency department with husband and family member at bedside for acute swelling of the neck left greater than right. Differential diagnosis includes sialadenitis parotitis, deep space infection of the neck, less likely odontogenic infection.  Comorbidities affecting care include history of diabetes/immunocompromise.  I ordered labs including strep test, BMP which shows mildly elevated blood glucose consistent with history of diabetes, CBC without elevated white blood cell count.  I ordered and independently interpreted a CT soft tissue neck with contrast which shows left sided inflammatory changes consistent with acute parotitis without abscess.  I discussed the findings with the patient and her husband at bedside.  I suspect that this is likely viral however given her history of diabetes and immunocompromise we will treat with oral antibiotic coverage first dose of clindamycin given here.  Patient also given a dose of anti-inflammatory IV.  Will discharge with clindamycin and naproxen.  She may follow closely with her PCP.  Discussed return precautions including worsening swelling pain fever or inability to tolerate swallowing fluids.  Social determinants of health includes close outpatient follow-up and  strong familial support.  I considered testing including CT angiogram of the neck however strep test negative and I doubt serious infection like Lemierre's disease  Amount and/or Complexity of Data Reviewed Labs: ordered. Radiology: ordered and independent interpretation performed.  Risk Prescription drug management.           Final Clinical Impression(s) / ED Diagnoses Final diagnoses:  None    Rx / DC Orders ED Discharge Orders     None  Margarita Mail, PA-C 08/04/22 1019    Milton Ferguson, MD 08/05/22 1229

## 2022-08-04 ENCOUNTER — Other Ambulatory Visit (HOSPITAL_COMMUNITY): Payer: Self-pay

## 2022-08-04 NOTE — Telephone Encounter (Signed)
Patient notified

## 2022-08-08 ENCOUNTER — Other Ambulatory Visit (HOSPITAL_COMMUNITY): Payer: Self-pay

## 2022-08-14 ENCOUNTER — Other Ambulatory Visit (HOSPITAL_COMMUNITY): Payer: Self-pay

## 2022-08-15 ENCOUNTER — Other Ambulatory Visit: Payer: Self-pay

## 2022-08-22 ENCOUNTER — Other Ambulatory Visit: Payer: Self-pay

## 2022-08-29 ENCOUNTER — Other Ambulatory Visit (HOSPITAL_COMMUNITY): Payer: Self-pay

## 2022-08-29 ENCOUNTER — Other Ambulatory Visit: Payer: Self-pay | Admitting: Family Medicine

## 2022-08-29 ENCOUNTER — Other Ambulatory Visit: Payer: Self-pay

## 2022-08-29 DIAGNOSIS — E119 Type 2 diabetes mellitus without complications: Secondary | ICD-10-CM

## 2022-09-04 ENCOUNTER — Other Ambulatory Visit (HOSPITAL_COMMUNITY): Payer: Self-pay

## 2022-09-26 ENCOUNTER — Ambulatory Visit (HOSPITAL_COMMUNITY)
Admission: RE | Admit: 2022-09-26 | Discharge: 2022-09-26 | Disposition: A | Discharge status: Home / Self Care | Payer: Commercial Managed Care - PPO | Source: Ambulatory Visit | Attending: Family Medicine | Admitting: Family Medicine

## 2022-09-26 ENCOUNTER — Other Ambulatory Visit (HOSPITAL_COMMUNITY): Payer: Self-pay

## 2022-09-26 ENCOUNTER — Other Ambulatory Visit: Payer: Self-pay

## 2022-09-26 ENCOUNTER — Ambulatory Visit: Payer: Commercial Managed Care - PPO | Admitting: Family Medicine

## 2022-09-26 VITALS — BP 126/76 | HR 72 | Ht 62.0 in | Wt 173.6 lb

## 2022-09-26 DIAGNOSIS — M7062 Trochanteric bursitis, left hip: Secondary | ICD-10-CM | POA: Diagnosis not present

## 2022-09-26 DIAGNOSIS — R9389 Abnormal findings on diagnostic imaging of other specified body structures: Secondary | ICD-10-CM

## 2022-09-26 DIAGNOSIS — M25552 Pain in left hip: Secondary | ICD-10-CM | POA: Diagnosis not present

## 2022-09-26 MED ORDER — PANTOPRAZOLE SODIUM 40 MG PO TBEC
40.0000 mg | DELAYED_RELEASE_TABLET | Freq: Every day | ORAL | 1 refills | Status: DC
  Filled 2022-09-26: qty 90, 90d supply, fill #0

## 2022-09-26 MED ORDER — TIRZEPATIDE 12.5 MG/0.5ML ~~LOC~~ SOAJ
12.5000 mg | SUBCUTANEOUS | 3 refills | Status: DC
  Filled 2022-09-26: qty 6, 84d supply, fill #0
  Filled 2022-09-27: qty 2, 28d supply, fill #0

## 2022-09-26 MED ORDER — ROSUVASTATIN CALCIUM 40 MG PO TABS
40.0000 mg | ORAL_TABLET | Freq: Every day | ORAL | 1 refills | Status: DC
  Filled 2022-09-26 – 2022-11-10 (×2): qty 90, 90d supply, fill #0

## 2022-09-26 MED ORDER — METFORMIN HCL 500 MG PO TABS
500.0000 mg | ORAL_TABLET | Freq: Every morning | ORAL | 1 refills | Status: DC
  Filled 2022-09-26 – 2022-11-10 (×2): qty 90, 90d supply, fill #0

## 2022-09-26 MED ORDER — AMLODIPINE BESYLATE 10 MG PO TABS
10.0000 mg | ORAL_TABLET | Freq: Every day | ORAL | 1 refills | Status: DC
  Filled 2022-09-26: qty 90, 90d supply, fill #0

## 2022-09-26 MED ORDER — ALPRAZOLAM 0.5 MG PO TABS
0.5000 mg | ORAL_TABLET | Freq: Every evening | ORAL | 5 refills | Status: DC
  Filled 2022-09-26: qty 30, 30d supply, fill #0
  Filled 2022-11-02: qty 30, 30d supply, fill #1
  Filled 2022-11-28: qty 30, 30d supply, fill #2
  Filled 2023-01-04 – 2023-01-06 (×2): qty 30, 30d supply, fill #3
  Filled 2023-02-08: qty 30, 30d supply, fill #4
  Filled 2023-03-12 (×2): qty 30, 30d supply, fill #5

## 2022-09-26 MED ORDER — LISINOPRIL-HYDROCHLOROTHIAZIDE 10-12.5 MG PO TABS
1.0000 | ORAL_TABLET | Freq: Every day | ORAL | 3 refills | Status: DC
  Filled 2022-09-26 – 2022-12-27 (×4): qty 90, 90d supply, fill #0
  Filled 2023-04-11 – 2023-04-16 (×4): qty 90, 90d supply, fill #1

## 2022-09-26 MED ORDER — MELOXICAM 15 MG PO TABS
15.0000 mg | ORAL_TABLET | Freq: Every day | ORAL | 0 refills | Status: DC
  Filled 2022-09-26: qty 30, 30d supply, fill #0

## 2022-09-26 NOTE — Progress Notes (Signed)
   Subjective:    Patient ID: Amy Dean, female    DOB: January 13, 1966, 57 y.o.   MRN: [id number]  HPI  Patient arrives with left hip pain. She relates significant left hip pain discomfort.  States the pain is pretty severe wakes her up at night Hurts with certain movements Denies any injury to it but it seems to be getting progressive over the past couple months no fever chills or weight loss  Patient states pain wakes her from dead sleep. Going on for 2 months.     Review of Systems     Objective:   Physical Exam Tenderness along the trochanter on the left side increased pain with internal and external rotation but not as severe low back nontender abdomen no tenderness       Assessment & Plan:  Hip bursitis Trochanter bursitis Cool compresses Anti-inflammatory next 2 to 3 weeks Orthopedic referral X-ray ordered

## 2022-09-27 ENCOUNTER — Other Ambulatory Visit (HOSPITAL_COMMUNITY): Payer: Self-pay

## 2022-10-06 ENCOUNTER — Encounter: Payer: Self-pay | Admitting: Family Medicine

## 2022-10-06 ENCOUNTER — Ambulatory Visit (HOSPITAL_COMMUNITY)
Admission: RE | Admit: 2022-10-06 | Discharge: 2022-10-06 | Disposition: A | Discharge status: Home / Self Care | Payer: Commercial Managed Care - PPO | Source: Ambulatory Visit | Attending: Family Medicine | Admitting: Family Medicine

## 2022-10-06 ENCOUNTER — Telehealth: Payer: Self-pay | Admitting: Family Medicine

## 2022-10-06 DIAGNOSIS — M25552 Pain in left hip: Secondary | ICD-10-CM | POA: Diagnosis not present

## 2022-10-06 DIAGNOSIS — M7062 Trochanteric bursitis, left hip: Secondary | ICD-10-CM | POA: Diagnosis not present

## 2022-10-06 DIAGNOSIS — R9389 Abnormal findings on diagnostic imaging of other specified body structures: Secondary | ICD-10-CM | POA: Insufficient documentation

## 2022-10-06 NOTE — Telephone Encounter (Signed)
Nurses I am glad that she has an appointment with Dr. Romeo Apple Please have referral coordinators request that the MRI be completed before this visit this MRI is a urgent/stat MRI because of potential for a fracture Hopefully MRI will look good and show perhaps just arthritis with no sign of previous fracture but it is better to be safe  Please also share this message with the needs thank you

## 2022-10-06 NOTE — Addendum Note (Signed)
Addended by: Margaretha Sheffield on: 10/06/2022 01:24 PM   Modules accepted: Orders

## 2022-10-06 NOTE — Telephone Encounter (Signed)
Results and provider recommendations  Seen by patient Amy Dean on 10/06/2022  8:51 AM via my chart. MRI ordered in EPIC. See my chart message 10/06/22 for patient's response  

## 2022-10-06 NOTE — Telephone Encounter (Signed)
Results and provider recommendations  Seen by patient Amy Dean on 10/06/2022  8:51 AM via my chart. MRI ordered in EPIC. See my chart message 10/06/22 for patient's response

## 2022-10-06 NOTE — Telephone Encounter (Signed)
Nurses-please see result note on her hip x-ray-please connect with patient regarding the recommendations.  Move forward with ordering urgent MRI if she is still having hip related symptoms thank you

## 2022-10-06 NOTE — Telephone Encounter (Signed)
Nurses-please see message from Virgie and my reply to you all regarding her MyChart message and trying to get the MRI early next week. (I was not sure if this message was routed to you or not thank you)

## 2022-10-13 ENCOUNTER — Ambulatory Visit (INDEPENDENT_AMBULATORY_CARE_PROVIDER_SITE_OTHER): Payer: Commercial Managed Care - PPO | Admitting: Orthopedic Surgery

## 2022-10-13 VITALS — BP 127/80 | HR 82 | Ht 62.0 in | Wt 165.0 lb

## 2022-10-13 DIAGNOSIS — M7062 Trochanteric bursitis, left hip: Secondary | ICD-10-CM

## 2022-10-13 MED ORDER — METHYLPREDNISOLONE ACETATE 40 MG/ML IJ SUSP
40.0000 mg | Freq: Once | INTRAMUSCULAR | Status: AC
  Administered 2022-10-13: 40 mg via INTRA_ARTICULAR

## 2022-10-13 NOTE — Progress Notes (Signed)
NEW PROBLEM//OFFICE VISIT   Chief Complaint  Patient presents with   Hip Pain    Left hip pain   57 year old female acute onset of left hip pain.  Patient reports pain over the lateral side of her left hip does not radiate proximally or distally seems to be related to direct pressure on the left hip  She was seen by primary care x-rays were taken there was a questionable fracture this was evaluated with MRI there was no fracture on MRI she presents for evaluation and management     ROS: Back pain denied numbness tingling none radiation of pain none  No Known Allergies  Current Outpatient Medications  Medication Instructions   ALPRAZolam (XANAX) 0.5 MG tablet Take 1 tablet (0.5 mg total) by mouth at bedtime as needed for sleep.   amLODipine (NORVASC) 10 mg, Oral, Daily   aspirin 81 mg, Oral, Daily   Insulin Pen Needle (PEN NEEDLES 31GX5/16") 31G X 8 MM MISC Use daily as directed   levETIRAcetam (KEPPRA) 500 mg, Oral, 2 times daily   lisinopril-hydrochlorothiazide (ZESTORETIC) 10-12.5 MG tablet 1 tablet, Oral, Daily   meloxicam (MOBIC) 15 mg, Oral, Daily   metFORMIN (GLUCOPHAGE) 500 mg, Oral, Every morning   Mounjaro 10 mg, Subcutaneous, Weekly   Mounjaro 12.5 mg, Subcutaneous, Weekly   pantoprazole (PROTONIX) 40 MG tablet Take 1 tablet (40 mg total) by mouth daily for acid reflux.   rosuvastatin (CRESTOR) 40 mg, Oral, Daily   triamcinolone cream (KENALOG) 0.1 % Apply a thin amount to finger as needed when flare up occurs    ROS   BP 127/80   Pulse 82   Ht 5\' 2"  (1.575 m)   Wt 165 lb (74.8 kg)   LMP 02/10/2011   BMI 30.18 kg/m   Body mass index is 30.18 kg/m.  General appearance: Well-developed well-nourished no gross deformities  Cardiovascular normal pulse and perfusion normal color without edema  Neurologically no sensation loss or deficits or pathologic reflexes  Psychological: Awake alert and oriented x3 mood and affect normal  Skin no lacerations or  ulcerations no nodularity no palpable masses, no erythema or nodularity  Musculoskeletal:  Tenderness over the left greater trochanter.  No back pain no back tenderness normal range of motion of the hip      Past Medical History:  Diagnosis Date   Anemia    Resolved post hysterectomy   Enteritis    Essential hypertension    Headache    Hyperlipidemia    SVT (supraventricular tachycardia)    Documented March 2023 - converted with adenosine   Type 2 diabetes mellitus [facility])     Past Surgical History:  Procedure Laterality Date   ABDOMINAL HYSTERECTOMY     CESAREAN SECTION  1982 and 1989   x2   COLONOSCOPY N/A 04/22/2015   Procedure: COLONOSCOPY;  Surgeon: Malissa Hippo, MD;  Location: [facility];  Service: Endoscopy;  Laterality: N/A;  1:00   ESOPHAGOGASTRODUODENOSCOPY (EGD) WITH PROPOFOL N/A 08/10/2021   Procedure: ESOPHAGOGASTRODUODENOSCOPY (EGD) WITH PROPOFOL;  Surgeon: Dolores Frame, MD;  Location: [facility];  Service: Gastroenterology;  Laterality: N/A;  730   EUS  10/26/2011   Procedure: UPPER ENDOSCOPIC ULTRASOUND (EUS) LINEAR;  Surgeon: Rachael Fee, MD;  Location: [facility] END[facility];  Service: Endoscopy;  Laterality: N/A;   HYSTEROSCOPY W/ ENDOMETRIAL ABLATION  07/22/2008   K.Tenny Craw MD [facility]   LAPAROSCOPIC APPENDECTOMY N/A 01/03/2021   Procedure: APPENDECTOMY LAPAROSCOPIC;  Surgeon: Franky Macho, MD;  Location: [facility]  ORS;  Service: General;  Laterality: N/A;   SAVORY DILATION  08/10/2021   Procedure: SAVORY DILATION;  Surgeon: Marguerita Merles, Reuel Boom, MD;  Location: [facility];  Service: Gastroenterology;;   SUPRACERVICAL ABDOMINAL HYSTERECTOMY  03/21/2011   Procedure: HYSTERECTOMY SUPRACERVICAL ABDOMINAL;  Surgeon: Tilda Burrow, MD;  Location: [facility] ORS;  Service: Gynecology;;   TONSILLECTOMY     TUBAL LIGATION      Family History  Problem Relation Age of Onset   Healthy Son    Sarcoidosis Mother    Heart attack Mother    Stroke Mother    Other  Father        unsure of history   Heart attack Paternal Uncle    Hypertension Maternal Grandmother    Anesthesia problems Neg Hx    Hypotension Neg Hx    Malignant hyperthermia Neg Hx    Pseudochol deficiency Neg Hx    Social History   Tobacco Use   Smoking status: Never    Passive exposure: Never   Smokeless tobacco: Never  Vaping Use   Vaping Use: Never used  Substance Use Topics   Alcohol use: Yes    Alcohol/week: 4.0 standard drinks of alcohol    Types: 4 Cans of beer per week   Drug use: No    No Known Allergies  Current Meds  Medication Sig   ALPRAZolam (XANAX) 0.5 MG tablet Take 1 tablet (0.5 mg total) by mouth at bedtime as needed for sleep.   amLODipine (NORVASC) 10 MG tablet Take 1 tablet (10 mg total) by mouth daily.   aspirin 81 MG tablet Take 81 mg by mouth daily.   Insulin Pen Needle (PEN NEEDLES 31GX5/16") 31G X 8 MM MISC Use daily as directed   levETIRAcetam (KEPPRA) 500 MG tablet Take 1 tablet (500 mg total) by mouth 2 (two) times daily.   lisinopril-hydrochlorothiazide (ZESTORETIC) 10-12.5 MG tablet Take 1 tablet by mouth daily.   meloxicam (MOBIC) 15 MG tablet Take 1 tablet (15 mg total) by mouth daily.   metFORMIN (GLUCOPHAGE) 500 MG tablet Take 1 tablet (500 mg total) by mouth in the morning.   pantoprazole (PROTONIX) 40 MG tablet Take 1 tablet (40 mg total) by mouth daily for acid reflux.   rosuvastatin (CRESTOR) 40 MG tablet Take 1 tablet (40 mg total) by mouth daily.   tirzepatide (MOUNJARO) 10 MG/0.5ML Pen Inject 10 mg into the skin once a week.   tirzepatide (MOUNJARO) 12.5 MG/0.5ML Pen Inject 12.5 mg into the skin once a week.   triamcinolone cream (KENALOG) 0.1 % Apply a thin amount to finger as needed when flare up occurs     MEDICAL DECISION MAKING  A.  Encounter Diagnosis  Name Primary?   Greater trochanteric bursitis of left hip Yes    B. DATA ANALYSED:   IMAGING: Interpretation of images: I have personally reviewed the images  and my interpretation is imaging normal hip imaging  MRI was reviewed normal MRI   Orders: No new orders  Outside records reviewed: Dr. Fletcher Anon notes   C. MANAGEMENT   Injection may repeat in 2 weeks if no improvement  Procedure note injection for left hip bursitis  Verbal consent was obtained for injection of the  left hip   Timeout was completed to confirm the injection site  The medications used were 40 mg of Depo-Medrol and 1% lidocaine 3 cc  Anesthesia was provided by ethyl chloride and the skin was prepped with alcohol.  After cleaning the skin  with alcohol a 25-gauge needle was used to inject the left hip greater trochanteric bursa   Meds ordered this encounter  Medications   methylPREDNISolone acetate (DEPO-MEDROL) injection 40 mg     Fuller Canada, MD  10/13/2022 12:24 PM

## 2022-11-02 ENCOUNTER — Other Ambulatory Visit (HOSPITAL_COMMUNITY): Payer: Self-pay

## 2022-11-03 ENCOUNTER — Other Ambulatory Visit: Payer: Self-pay

## 2022-11-06 ENCOUNTER — Other Ambulatory Visit (HOSPITAL_COMMUNITY): Payer: Self-pay

## 2022-11-06 ENCOUNTER — Encounter: Payer: Self-pay | Admitting: Family Medicine

## 2022-11-06 NOTE — Telephone Encounter (Signed)
Nurses Please go ahead with the 12.5 may have 1 month with 5 refills of the [facility]  She also has lab work ordered please do lab work before her follow-up visit in August she can wait till closer to the August appointment thank you

## 2022-11-07 ENCOUNTER — Other Ambulatory Visit (HOSPITAL_COMMUNITY): Payer: Self-pay

## 2022-11-08 ENCOUNTER — Other Ambulatory Visit: Payer: Self-pay

## 2022-11-08 ENCOUNTER — Other Ambulatory Visit (HOSPITAL_COMMUNITY): Payer: Self-pay

## 2022-11-08 DIAGNOSIS — E119 Type 2 diabetes mellitus without complications: Secondary | ICD-10-CM

## 2022-11-08 MED ORDER — TIRZEPATIDE 12.5 MG/0.5ML ~~LOC~~ SOAJ
12.5000 mg | SUBCUTANEOUS | 5 refills | Status: DC
  Filled 2022-11-08: qty 2, 28d supply, fill #0
  Filled 2022-12-11: qty 2, 28d supply, fill #1

## 2022-11-10 ENCOUNTER — Other Ambulatory Visit (HOSPITAL_COMMUNITY): Payer: Self-pay

## 2022-11-10 ENCOUNTER — Other Ambulatory Visit: Payer: Self-pay

## 2022-11-13 ENCOUNTER — Other Ambulatory Visit (HOSPITAL_COMMUNITY): Payer: Self-pay

## 2022-11-16 ENCOUNTER — Other Ambulatory Visit (HOSPITAL_COMMUNITY): Payer: Self-pay

## 2022-11-20 ENCOUNTER — Other Ambulatory Visit (HOSPITAL_COMMUNITY): Payer: Self-pay

## 2022-11-21 ENCOUNTER — Other Ambulatory Visit: Payer: Self-pay

## 2022-11-21 DIAGNOSIS — E119 Type 2 diabetes mellitus without complications: Secondary | ICD-10-CM | POA: Diagnosis not present

## 2022-11-21 DIAGNOSIS — E785 Hyperlipidemia, unspecified: Secondary | ICD-10-CM | POA: Diagnosis not present

## 2022-11-21 DIAGNOSIS — E669 Obesity, unspecified: Secondary | ICD-10-CM | POA: Diagnosis not present

## 2022-11-21 DIAGNOSIS — E1169 Type 2 diabetes mellitus with other specified complication: Secondary | ICD-10-CM | POA: Diagnosis not present

## 2022-11-22 LAB — BASIC METABOLIC PANEL
BUN/Creatinine Ratio: 19 (ref 9–23)
BUN: 13 mg/dL (ref 6–24)
CO2: 25 mmol/L (ref 20–29)
Calcium: 10.3 mg/dL — ABNORMAL HIGH (ref 8.7–10.2)
Chloride: 101 mmol/L (ref 96–106)
Creatinine, Ser: 0.7 mg/dL (ref 0.57–1.00)
Glucose: 93 mg/dL (ref 70–99)
Potassium: 4.3 mmol/L (ref 3.5–5.2)
Sodium: 142 mmol/L (ref 134–144)
eGFR: 100 mL/min/{1.73_m2} (ref >59)

## 2022-11-22 LAB — HEPATIC FUNCTION PANEL
ALT: 22 IU/L (ref 0–32)
AST: 24 IU/L (ref 0–40)
Albumin: 4.9 g/dL (ref 3.8–4.9)
Alkaline Phosphatase: 64 IU/L (ref 44–121)
Bilirubin Total: 0.7 mg/dL (ref 0.0–1.2)
Bilirubin, Direct: 0.18 mg/dL (ref 0.00–0.40)
Total Protein: 7.4 g/dL (ref 6.0–8.5)

## 2022-11-22 LAB — HEMOGLOBIN A1C
Est. average glucose Bld gHb Est-mCnc: 117 mg/dL
Hgb A1c MFr Bld: 5.7 % — ABNORMAL HIGH (ref 4.8–5.6)

## 2022-11-22 LAB — LIPID PANEL
Chol/HDL Ratio: 2.3 ratio (ref 0.0–4.4)
Cholesterol, Total: 186 mg/dL (ref 100–199)
HDL: 80 mg/dL (ref >39)
LDL Chol Calc (NIH): 85 mg/dL (ref 0–99)
Triglycerides: 123 mg/dL (ref 0–149)
VLDL Cholesterol Cal: 21 mg/dL (ref 5–40)

## 2022-11-28 ENCOUNTER — Other Ambulatory Visit (HOSPITAL_COMMUNITY): Payer: Self-pay

## 2022-11-29 ENCOUNTER — Other Ambulatory Visit: Payer: Self-pay

## 2022-11-29 ENCOUNTER — Other Ambulatory Visit (HOSPITAL_COMMUNITY): Payer: Self-pay

## 2022-11-30 ENCOUNTER — Other Ambulatory Visit (HOSPITAL_COMMUNITY): Payer: Self-pay

## 2022-12-02 DIAGNOSIS — S81811A Laceration without foreign body, right lower leg, initial encounter: Secondary | ICD-10-CM | POA: Diagnosis not present

## 2022-12-11 ENCOUNTER — Other Ambulatory Visit (HOSPITAL_COMMUNITY): Payer: Self-pay

## 2022-12-11 ENCOUNTER — Other Ambulatory Visit: Payer: Self-pay

## 2022-12-12 ENCOUNTER — Other Ambulatory Visit (HOSPITAL_COMMUNITY): Payer: Self-pay

## 2022-12-12 ENCOUNTER — Other Ambulatory Visit: Payer: Self-pay

## 2022-12-12 ENCOUNTER — Ambulatory Visit: Payer: Commercial Managed Care - PPO | Admitting: Family Medicine

## 2022-12-12 ENCOUNTER — Encounter: Payer: Self-pay | Admitting: Family Medicine

## 2022-12-12 VITALS — BP 118/74 | HR 84 | Temp 97.0°F | Ht 62.0 in | Wt 171.0 lb

## 2022-12-12 DIAGNOSIS — I10 Essential (primary) hypertension: Secondary | ICD-10-CM

## 2022-12-12 DIAGNOSIS — E785 Hyperlipidemia, unspecified: Secondary | ICD-10-CM

## 2022-12-12 DIAGNOSIS — E1169 Type 2 diabetes mellitus with other specified complication: Secondary | ICD-10-CM | POA: Diagnosis not present

## 2022-12-12 DIAGNOSIS — E119 Type 2 diabetes mellitus without complications: Secondary | ICD-10-CM

## 2022-12-12 DIAGNOSIS — Z7985 Long-term (current) use of injectable non-insulin antidiabetic drugs: Secondary | ICD-10-CM | POA: Diagnosis not present

## 2022-12-12 MED ORDER — PANTOPRAZOLE SODIUM 40 MG PO TBEC
40.0000 mg | DELAYED_RELEASE_TABLET | Freq: Every day | ORAL | 1 refills | Status: DC
  Filled 2022-12-12 (×2): qty 90, 90d supply, fill #0
  Filled 2023-03-12 (×2): qty 90, 90d supply, fill #1

## 2022-12-12 MED ORDER — METFORMIN HCL 500 MG PO TABS
500.0000 mg | ORAL_TABLET | Freq: Every morning | ORAL | 1 refills | Status: DC
  Filled 2022-12-12 – 2023-02-05 (×2): qty 90, 90d supply, fill #0
  Filled 2023-05-07: qty 90, 90d supply, fill #1

## 2022-12-12 MED ORDER — ROSUVASTATIN CALCIUM 40 MG PO TABS
40.0000 mg | ORAL_TABLET | Freq: Every day | ORAL | 1 refills | Status: DC
  Filled 2022-12-12 – 2023-02-05 (×2): qty 90, 90d supply, fill #0
  Filled 2023-05-07: qty 90, 90d supply, fill #1

## 2022-12-12 MED ORDER — TIRZEPATIDE 15 MG/0.5ML ~~LOC~~ SOAJ
15.0000 mg | SUBCUTANEOUS | 5 refills | Status: DC
  Filled 2022-12-12 (×2): qty 2, 28d supply, fill #0
  Filled 2023-01-04: qty 2, 28d supply, fill #1
  Filled 2023-02-05: qty 2, 28d supply, fill #2
  Filled 2023-03-12 (×2): qty 2, 28d supply, fill #3
  Filled 2023-04-11 – 2023-04-16 (×2): qty 2, 28d supply, fill #4
  Filled 2023-05-18 (×2): qty 2, 28d supply, fill #5

## 2022-12-12 MED ORDER — AMLODIPINE BESYLATE 10 MG PO TABS
10.0000 mg | ORAL_TABLET | Freq: Every day | ORAL | 1 refills | Status: DC
  Filled 2022-12-12 (×2): qty 90, 90d supply, fill #0
  Filled 2023-03-12: qty 90, 90d supply, fill #1

## 2022-12-12 NOTE — Progress Notes (Signed)
   Subjective:    Patient ID: Amy Dean, female    DOB: [birth year], 57 y.o.   MRN: [id number]  Diabetes She presents for her follow-up diabetic visit. She has type 2 diabetes mellitus.  Essential hypertension  Controlled type 2 diabetes mellitus without complication, without long-term current use of insulin (HCC)  Hyperlipidemia associated with type 2 diabetes mellitus (HCC) - Plan: CANCELED: Lipid Panel  Hypercalcemia - Plan: Calcium, ionized, Parathyroid hormone, intact (no Ca), CANCELED: PTH, Intact (ICMA) and Ionized Calcium  Patient here for follow-up regarding cholesterol.    Patient relates taking medication on a regular basis Denies problems with medication Importance of dietary measures discussed Regular lab work regarding lipid and liver was checked and if needing additional labs was appropriately ordered   Patient asks about mounjaro dose increase to 15 mg Patient tried to go up before but unfortunately did not have the dosing available Patient is trying to watch her diet trying to stay active works full-time Lab work reviewed Kidney function looks good, electrolytes look good, calcium elevated 10.3, cholesterol profile reasonable but LDL not at goal, liver enzymes look good, A1c looks good at 5.7  Review of Systems     Objective:   Physical Exam General-in no acute distress Eyes-no discharge Lungs-respiratory rate normal, CTA CV-no murmurs,RRR Extremities skin warm dry no edema Neuro grossly normal Behavior normal, alert ADiabetic foot exam normal      Assessment & Plan:   1. Controlled type 2 diabetes mellitus without complication, without long-term current use of insulin (HCC) The patient was seen today as part of a comprehensive visit for diabetes. The importance of keeping her A1c at or below 7 range was discussed.  Discussed diet, activity, and medication compliance Emphasized healthy eating primarily with vegetables fruits and if utilizing  meats lean meats such as chicken or fish grilled baked broiled Avoid sugary drinks Minimize and avoid processed foods Fit in regular physical activity preferably 25 to 30 minutes 4 times per week Standard follow-up visit recommended.  Patient aware lack of control and follow-up increases risk of diabetic complications. Regular follow-up visits Yearly ophthalmology Yearly foot exam   2. Essential hypertension HTN- patient seen for follow-up regarding HTN.   Diet, medication compliance, appropriate labs and refills were completed.   Importance of keeping blood pressure under good control to lessen the risk of complications discussed Regular follow-up visits discussed   3. Hyperlipidemia associated with type 2 diabetes mellitus (HCC) Hyperlipidemia-importance of diet, weight control, activity, compliance with medications discussed.   Recent labs reviewed.   Any additional labs or refills ordered.   Importance of keeping under good control discussed. Regular follow-up visits discussed  LDL above goal.  Bumping up the dose of Mounjaro.  Hopefully this will help LDL come down more continue rosuvastatin.  Recheck labs again in October if numbers are not where they need to be add Zetia  4. Hypercalcemia Slight elevation.  Not worrisome but will check PTH and calcium on next blood draw patient not taking any supplements currently

## 2022-12-27 ENCOUNTER — Other Ambulatory Visit (HOSPITAL_COMMUNITY): Payer: Self-pay

## 2023-01-04 ENCOUNTER — Other Ambulatory Visit: Payer: Self-pay | Admitting: Family Medicine

## 2023-01-04 ENCOUNTER — Other Ambulatory Visit: Payer: Self-pay

## 2023-01-04 ENCOUNTER — Other Ambulatory Visit (HOSPITAL_COMMUNITY): Payer: Self-pay

## 2023-01-06 ENCOUNTER — Other Ambulatory Visit (HOSPITAL_COMMUNITY): Payer: Self-pay

## 2023-01-09 ENCOUNTER — Other Ambulatory Visit: Payer: Self-pay

## 2023-01-09 ENCOUNTER — Other Ambulatory Visit (HOSPITAL_COMMUNITY): Payer: Self-pay

## 2023-01-09 MED ORDER — TRIAMCINOLONE ACETONIDE 0.1 % EX CREA
TOPICAL_CREAM | CUTANEOUS | 1 refills | Status: DC
  Filled 2023-01-09 – 2023-01-10 (×2): qty 45, 30d supply, fill #0
  Filled 2023-02-05: qty 45, 30d supply, fill #1

## 2023-01-10 ENCOUNTER — Other Ambulatory Visit: Payer: Self-pay

## 2023-01-11 ENCOUNTER — Other Ambulatory Visit (HOSPITAL_COMMUNITY): Payer: Self-pay

## 2023-01-15 ENCOUNTER — Ambulatory Visit (INDEPENDENT_AMBULATORY_CARE_PROVIDER_SITE_OTHER): Payer: Commercial Managed Care - PPO | Admitting: Orthopedic Surgery

## 2023-01-15 ENCOUNTER — Other Ambulatory Visit: Payer: Self-pay

## 2023-01-15 DIAGNOSIS — M7062 Trochanteric bursitis, left hip: Secondary | ICD-10-CM

## 2023-01-15 MED ORDER — METHYLPREDNISOLONE ACETATE 40 MG/ML IJ SUSP
40.0000 mg | Freq: Once | INTRAMUSCULAR | Status: AC
  Administered 2023-01-15: 40 mg via INTRA_ARTICULAR

## 2023-01-15 NOTE — Patient Instructions (Signed)
You have received an injection of steroids into the joint. 15% of patients will have increased pain within the 24 hours postinjection.   This is transient and will go away.   We recommend that you use ice packs on the injection site for 20 minutes every 2 hours and extra strength Tylenol 2 tablets every 8 as needed until the pain resolves.  If you continue to have pain after taking the Tylenol and using the ice please call the office for further instructions.  

## 2023-01-15 NOTE — Progress Notes (Signed)
Follow-up visit  Encounter Diagnosis  Name Primary?   Greater trochanteric bursitis of left hip Yes    Chief Complaint  Patient presents with   Injections    Left HIp   Prior history October 13, 2022 57 year old female acute onset of left hip pain.  Patient reports pain over the lateral side of her left hip does not radiate proximally or distally seems to be related to direct pressure on the left hip   She was seen by primary care x-rays were taken there was a questionable fracture this was evaluated with MRI there was no fracture on MRI she presents for evaluation and management  January 15, 2023 today  The patient got good relief from the injection so we will repeat  Procedure note Greater trochanteric bursa injection left hip Medication Depo-Medrol 40 mg lidocaine 1% 2 cc  Technique the skin was prepped with alcohol and ethyl chloride and then a spinal needle was used to inject the greater trochanteric bursa of the left hip  We observed no complications   Follow-up as needed

## 2023-02-05 ENCOUNTER — Other Ambulatory Visit (HOSPITAL_COMMUNITY): Payer: Self-pay

## 2023-02-05 ENCOUNTER — Other Ambulatory Visit: Payer: Self-pay

## 2023-02-06 ENCOUNTER — Other Ambulatory Visit: Payer: Self-pay

## 2023-02-07 ENCOUNTER — Other Ambulatory Visit (HOSPITAL_COMMUNITY): Payer: Self-pay

## 2023-02-08 ENCOUNTER — Ambulatory Visit: Payer: Commercial Managed Care - PPO | Admitting: Obstetrics & Gynecology

## 2023-02-08 ENCOUNTER — Encounter: Payer: Self-pay | Admitting: Obstetrics & Gynecology

## 2023-02-08 ENCOUNTER — Other Ambulatory Visit (HOSPITAL_COMMUNITY)
Admission: RE | Admit: 2023-02-08 | Discharge: 2023-02-08 | Disposition: A | Discharge status: Home / Self Care | Payer: Commercial Managed Care - PPO | Source: Ambulatory Visit | Attending: Obstetrics & Gynecology | Admitting: Obstetrics & Gynecology

## 2023-02-08 ENCOUNTER — Other Ambulatory Visit: Payer: Self-pay

## 2023-02-08 ENCOUNTER — Other Ambulatory Visit (HOSPITAL_COMMUNITY): Payer: Self-pay

## 2023-02-08 VITALS — BP 122/73 | HR 76 | Ht 64.0 in | Wt 164.0 lb

## 2023-02-08 DIAGNOSIS — Z01419 Encounter for gynecological examination (general) (routine) without abnormal findings: Secondary | ICD-10-CM

## 2023-02-08 DIAGNOSIS — Z1211 Encounter for screening for malignant neoplasm of colon: Secondary | ICD-10-CM

## 2023-02-08 DIAGNOSIS — Z90710 Acquired absence of both cervix and uterus: Secondary | ICD-10-CM | POA: Diagnosis not present

## 2023-02-08 LAB — HEMOCCULT GUIAC POC 1CARD (OFFICE): Fecal Occult Blood, POC: NEGATIVE

## 2023-02-08 NOTE — Addendum Note (Signed)
Addended by: Moss Mc on: 02/08/2023 09:36 AM   Modules accepted: Orders

## 2023-02-08 NOTE — Progress Notes (Signed)
Subjective:     Amy Dean is a 57 y.o. female here for a routine exam.  Patient's last menstrual period was 02/10/2011. [id number] Birth Control Method:  supracervical hysterectomy Menstrual Calendar(currently): amenorrhea  Current complaints: rare crampy abd pain, last a few seconds.   Current acute medical issues:  nond   Recent Gynecologic History Patient's last menstrual period was 02/10/2011. Last Pap: 10/2017,  normal Last mammogram: 04/2022,  normal  Past Medical History:  Diagnosis Date   Anemia    Resolved post hysterectomy   Enteritis    Essential hypertension    GERD (gastroesophageal reflux disease)    Headache    Hyperlipidemia    SVT (supraventricular tachycardia) [facility])    Documented March 2023 - converted with adenosine   Type 2 diabetes mellitus [facility])     Past Surgical History:  Procedure Laterality Date   ABDOMINAL HYSTERECTOMY     CESAREAN SECTION  1982 and 1989   x2   COLONOSCOPY N/A 04/22/2015   Procedure: COLONOSCOPY;  Surgeon: Malissa Hippo, MD;  Location: [facility];  Service: Endoscopy;  Laterality: N/A;  1:00   ESOPHAGOGASTRODUODENOSCOPY (EGD) WITH PROPOFOL N/A 08/10/2021   Procedure: ESOPHAGOGASTRODUODENOSCOPY (EGD) WITH PROPOFOL;  Surgeon: Dolores Frame, MD;  Location: [facility];  Service: Gastroenterology;  Laterality: N/A;  730   EUS  10/26/2011   Procedure: UPPER ENDOSCOPIC ULTRASOUND (EUS) LINEAR;  Surgeon: Rachael Fee, MD;  Location: [facility];  Service: Endoscopy;  Laterality: N/A;   HYSTEROSCOPY W/ ENDOMETRIAL ABLATION  07/22/2008   K.Tenny Craw MD [facility]   LAPAROSCOPIC APPENDECTOMY N/A 01/03/2021   Procedure: APPENDECTOMY LAPAROSCOPIC;  Surgeon: Franky Macho, MD;  Location: [facility] ORS;  Service: General;  Laterality: N/A;   SAVORY DILATION  08/10/2021   Procedure: SAVORY DILATION;  Surgeon: Marguerita Merles, Reuel Boom, MD;  Location: [facility];  Service: Gastroenterology;;   SUPRACERVICAL ABDOMINAL HYSTERECTOMY   03/21/2011   Procedure: HYSTERECTOMY SUPRACERVICAL ABDOMINAL;  Surgeon: Tilda Burrow, MD;  Location: [facility];  Service: Gynecology;;   TONSILLECTOMY     TUBAL LIGATION      OB History     Gravida  2   Para  2   Term      Preterm      AB  0   Living  2      SAB  0   IAB      Ectopic      Multiple      Live Births              Social History   Socioeconomic History   Marital status: Married    Spouse name: Mikyah, [place].   Number of children: 1   Years of education: 12th grade   Highest education level: Not on file  Occupational History   Occupation: CSPDT    Employer: [employer]  Tobacco Use   Smoking status: Never    Passive exposure: Never   Smokeless tobacco: Never  Vaping Use   Vaping status: Never Used  Substance and Sexual Activity   Alcohol use: Yes    Alcohol/week: 4.0 standard drinks of alcohol    Types: 4 Cans of beer per week    Comment: occas   Drug use: No   Sexual activity: Yes    Birth control/protection: Surgical    Comment: hyst  Other Topics Concern   Not on file  Social History Narrative   Lives with her husband,married for 33 years.  Right-handed.   Occasional soda (1-2 times per week).   Works at [employer].   Social Determinants of Health   Financial Resource Strain: Patient Unable To Answer (02/08/2023)   Overall Financial Resource Strain (CARDIA)    Difficulty of Paying Living Expenses: Patient unable to answer  Food Insecurity: No Food Insecurity (02/08/2023)   Hunger Vital Sign    Worried About Running Out of Food in the Last Year: Never true    Ran Out of Food in the Last Year: Never true  Transportation Needs: No Transportation Needs (02/08/2023)   PRAPARE - Administrator, Civil Service (Medical): No    Lack of Transportation (Non-Medical): No  Physical Activity: Insufficiently Active (02/08/2023)   Exercise Vital Sign    Days of Exercise per Week: 2 days    Minutes  of Exercise per Session: 20 min  Stress: No Stress Concern Present (02/08/2023)   [employer] of Occupational Health - Occupational Stress Questionnaire    Feeling of Stress : Only a little  Social Connections: Moderately Integrated (02/08/2023)   Social Connection and Isolation Panel [NHANES]    Frequency of Communication with Friends and Family: More than three times a week    Frequency of Social Gatherings with Friends and Family: Twice a week    Attends Religious Services: More than 4 times per year    Active Member of [employer] or Organizations: No    Attends Engineer, structural: Never    Marital Status: Married    Family History  Problem Relation Age of Onset   Healthy Son    Sarcoidosis Mother    Heart attack Mother    Stroke Mother    Other Father        unsure of history   Heart attack Paternal Uncle    Hypertension Maternal Grandmother    Anesthesia problems Neg Hx    Hypotension Neg Hx    Malignant hyperthermia Neg Hx    Pseudochol deficiency Neg Hx      Current Outpatient Medications:    ALPRAZolam (XANAX) 0.5 MG tablet, Take 1 tablet (0.5 mg total) by mouth at bedtime as needed for sleep., Disp: 30 tablet, Rfl: 5   amLODipine (NORVASC) 10 MG tablet, Take 1 tablet (10 mg) by mouth daily., Disp: 90 tablet, Rfl: 1   aspirin 81 MG tablet, Take 81 mg by mouth daily., Disp: , Rfl:    Insulin Pen Needle (PEN NEEDLES 31GX5/16") 31G X 8 MM MISC, Use daily as directed, Disp: 100 each, Rfl: 11   lisinopril-hydrochlorothiazide (ZESTORETIC) 10-12.5 MG tablet, Take 1 tablet by mouth daily., Disp: 90 tablet, Rfl: 3   meloxicam (MOBIC) 15 MG tablet, Take 1 tablet (15 mg total) by mouth daily., Disp: 30 tablet, Rfl: 0   metFORMIN (GLUCOPHAGE) 500 MG tablet, Take 1 tablet (500 mg total) by mouth in the morning., Disp: 90 tablet, Rfl: 1   pantoprazole (PROTONIX) 40 MG tablet, Take 1 tablet (40 mg) by mouth daily for acid reflux., Disp: 90 tablet, Rfl: 1   rosuvastatin  (CRESTOR) 40 MG tablet, Take 1 tablet (40 mg total) by mouth daily., Disp: 90 tablet, Rfl: 1   tirzepatide (MOUNJARO) 15 MG/0.5ML Pen, Inject 15 mg into the skin once a week., Disp: 2 mL, Rfl: 5   triamcinolone cream (KENALOG) 0.1 %, Apply a thin amount to finger as needed when flare up occurs, Disp: 45 g, Rfl: 1   levETIRAcetam (KEPPRA) 500 MG tablet, Take 1  tablet (500 mg total) by mouth 2 (two) times daily. (Patient not taking: Reported on 02/08/2023), Disp: 60 tablet, Rfl: 6  Review of Systems  Review of Systems  Constitutional: Negative for fever, chills, weight loss, malaise/fatigue and diaphoresis.  HENT: Negative for hearing loss, ear pain, nosebleeds, congestion, sore throat, neck pain, tinnitus and ear discharge.   Eyes: Negative for blurred vision, double vision, photophobia, pain, discharge and redness.  Respiratory: Negative for cough, hemoptysis, sputum production, shortness of breath, wheezing and stridor.   Cardiovascular: Negative for chest pain, palpitations, orthopnea, claudication, leg swelling and PND.  Gastrointestinal: negative for abdominal pain. Negative for heartburn, nausea, vomiting, diarrhea, constipation, blood in stool and melena.  Genitourinary: Negative for dysuria, urgency, frequency, hematuria and flank pain.  Musculoskeletal: Negative for myalgias, back pain, joint pain and falls.  Skin: Negative for itching and rash.  Neurological: Negative for dizziness, tingling, tremors, sensory change, speech change, focal weakness, seizures, loss of consciousness, weakness and headaches.  Endo/Heme/Allergies: Negative for environmental allergies and polydipsia. Does not bruise/bleed easily.  Psychiatric/Behavioral: Negative for depression, suicidal ideas, hallucinations, memory loss and substance abuse. The patient is not nervous/anxious and does not have insomnia.        Objective:  Blood pressure 122/73, pulse 76, height 5\' 4"  (1.626 m), weight 164 lb (74.4 kg), last  menstrual period 02/10/2011.   Physical Exam  Vitals reviewed. Constitutional: She is oriented to person, place, and time. She appears well-developed and well-nourished.  HENT:  Head: Normocephalic and atraumatic.        Right Ear: External ear normal.  Left Ear: External ear normal.  Nose: Nose normal.  Mouth/Throat: Oropharynx is clear and moist.  Eyes: Conjunctivae and EOM are normal. Pupils are equal, round, and reactive to light. Right eye exhibits no discharge. Left eye exhibits no discharge. No scleral icterus.  Neck: Normal range of motion. Neck supple. No tracheal deviation present. No thyromegaly present.  Cardiovascular: Normal rate, regular rhythm, normal heart sounds and intact distal pulses.  Exam reveals no gallop and no friction rub.   No murmur heard. Respiratory: Effort normal and breath sounds normal. No respiratory distress. She has no wheezes. She has no rales. She exhibits no tenderness.  GI: Soft. Bowel sounds are normal. She exhibits no distension and no mass. There is no tenderness. There is no rebound and no guarding.  Genitourinary:  Breasts no masses skin changes or nipple changes bilaterally      Vulva is normal without lesions Vagina is pink moist without discharge Cervix normal in appearance and pap is done Uterus is absent Adnexa is negative {Rectal    hemoccult negative, normal tone, no masses  Musculoskeletal: Normal range of motion. She exhibits no edema and no tenderness.  Neurological: She is alert and oriented to person, place, and time. She has normal reflexes. She displays normal reflexes. No cranial nerve deficit. She exhibits normal muscle tone. Coordination normal.  Skin: Skin is warm and dry. No rash noted. No erythema. No pallor.  Psychiatric: She has a normal mood and affect. Her behavior is normal. Judgment and thought content normal.       Medications Ordered at today's visit: No orders of the defined types were placed in this  encounter.   Other orders placed at today's visit: No orders of the defined types were placed in this encounter.     Assessment:    Normal Gyn exam.   S/P supracervical hysterectomy 12/2010 Plan:    Pap performed, exam normal,  routine Pap 5 years if normal      Return in about 5 years (around 02/08/2028) for yearly.

## 2023-02-09 LAB — PARATHYROID HORMONE, INTACT (NO CA): PTH: 30 pg/mL (ref 15–65)

## 2023-02-09 LAB — CALCIUM, IONIZED: Calcium, Ion: 5.3 mg/dL (ref 4.5–5.6)

## 2023-02-12 ENCOUNTER — Other Ambulatory Visit: Payer: Self-pay

## 2023-02-12 ENCOUNTER — Ambulatory Visit: Payer: Commercial Managed Care - PPO | Admitting: Family Medicine

## 2023-02-12 VITALS — BP 132/75 | HR 78 | Temp 98.6°F | Ht 64.0 in | Wt 163.8 lb

## 2023-02-12 DIAGNOSIS — M7918 Myalgia, other site: Secondary | ICD-10-CM | POA: Diagnosis not present

## 2023-02-12 LAB — CYTOLOGY - PAP
Comment: NEGATIVE
Diagnosis: NEGATIVE
High risk HPV: NEGATIVE

## 2023-02-12 MED ORDER — BACLOFEN 10 MG PO TABS
5.0000 mg | ORAL_TABLET | Freq: Three times a day (TID) | ORAL | 0 refills | Status: DC | PRN
  Filled 2023-02-12: qty 30, 10d supply, fill #0

## 2023-02-12 NOTE — Assessment & Plan Note (Signed)
Based on her exam, this appears to be muscular in nature.  Baclofen as directed.  If continues to persist, recommended following up with orthopedics.

## 2023-02-12 NOTE — Progress Notes (Signed)
Subjective:  Patient ID: Amy Dean, female    DOB: 1965-07-01  Age: 57 y.o. MRN: [id number]  CC: Neck pain   HPI:  57 year old female presents for evaluation the above.  Patient reports a 3-week history of left-sided neck pain and pain of the left trapezius.  She states that seems to be worse when she raises her arms and also when she is resting at night.  She states that it seems to go around towards the front of the neck towards the chest.  No fall, trauma, injury.  She has been using Tylenol and Biofreeze without resolution.  She has an orthopedist but has not seen him for this.  Patient Active Problem List   Diagnosis Date Noted   Musculoskeletal pain 02/12/2023   PVC (premature ventricular contraction) 03/13/2022   Left facial numbness 11/24/2021   Mild concentric left ventricular hypertrophy (LVH) 07/18/2021   Dyshidrotic eczema 08/10/2020   Hyperlipidemia associated with type 2 diabetes mellitus (HCC) 04/01/2014   Depression with anxiety 01/05/2014   Gastroesophageal reflux disease 12/18/2011   Diabetes mellitus type II, controlled (HCC) 03/23/2011    Class: Chronic   Essential hypertension 12/10/2008    Social Hx   Social History   Socioeconomic History   Marital status: Married    Spouse name: Avri, Graeber.   Number of children: 1   Years of education: 12th grade   Highest education level: Not on file  Occupational History   Occupation: CSPDT    Employer: [employer]  Tobacco Use   Smoking status: Never    Passive exposure: Never   Smokeless tobacco: Never  Vaping Use   Vaping status: Never Used  Substance and Sexual Activity   Alcohol use: Yes    Alcohol/week: 4.0 standard drinks of alcohol    Types: 4 Cans of beer per week    Comment: occas   Drug use: No   Sexual activity: Yes    Birth control/protection: Surgical    Comment: hyst  Other Topics Concern   Not on file  Social History Narrative   Lives with her husband,married  for 33 years.   Right-handed.   Occasional soda (1-2 times per week).   Works at [employer].   Social Determinants of Health   Financial Resource Strain: Patient Unable To Answer (02/08/2023)   Overall Financial Resource Strain (CARDIA)    Difficulty of Paying Living Expenses: Patient unable to answer  Food Insecurity: No Food Insecurity (02/08/2023)   Hunger Vital Sign    Worried About Running Out of Food in the Last Year: Never true    Ran Out of Food in the Last Year: Never true  Transportation Needs: No Transportation Needs (02/08/2023)   PRAPARE - Administrator, Civil Service (Medical): No    Lack of Transportation (Non-Medical): No  Physical Activity: Insufficiently Active (02/08/2023)   Exercise Vital Sign    Days of Exercise per Week: 2 days    Minutes of Exercise per Session: 20 min  Stress: No Stress Concern Present (02/08/2023)   [employer] of Occupational Health - Occupational Stress Questionnaire    Feeling of Stress : Only a little  Social Connections: Moderately Integrated (02/08/2023)   Social Connection and Isolation Panel [NHANES]    Frequency of Communication with Friends and Family: More than three times a week    Frequency of Social Gatherings with Friends and Family: Twice a week    Attends Religious Services: More than  4 times per year    Active Member of Clubs or Organizations: No    Attends Banker Meetings: Never    Marital Status: Married    Review of Systems Per HPI  Objective:  BP 132/75   Pulse 78   Temp 98.6 F (37 C)   Ht 5\' 4"  (1.626 m)   Wt 163 lb 12.8 oz (74.3 kg)   LMP 02/10/2011   SpO2 100%   BMI 28.12 kg/m      02/12/2023    1:08 PM 02/08/2023    8:40 AM 12/12/2022    8:20 AM  BP/Weight  Systolic BP 132 122 118  Diastolic BP 75 73 74  Wt. (Lbs) 163.8 164 171  BMI 28.12 kg/m2 28.15 kg/m2 31.28 kg/m2    Physical Exam Vitals and nursing note reviewed.  Constitutional:       General: She is not in acute distress.    Appearance: Normal appearance.  HENT:     Head: Normocephalic and atraumatic.  Neck:      Comments: Patient with tenderness at the level locations. Pulmonary:     Effort: Pulmonary effort is normal. No respiratory distress.  Musculoskeletal:     Comments: Left shoulder -normal range of motion.  Normal rotator cuff strength.    Neurological:     Mental Status: She is alert.  Psychiatric:        Mood and Affect: Mood normal.        Behavior: Behavior normal.     Lab Results  Component Value Date   WBC 5.4 08/03/2022   HGB 12.1 08/03/2022   HCT 35.5 (L) 08/03/2022   PLT 168 08/03/2022   GLUCOSE 93 11/21/2022   CHOL 186 11/21/2022   TRIG 123 11/21/2022   HDL 80 11/21/2022   LDLCALC 85 11/21/2022   ALT 22 11/21/2022   AST 24 11/21/2022   NA 142 11/21/2022   K 4.3 11/21/2022   CL 101 11/21/2022   CREATININE 0.70 11/21/2022   BUN 13 11/21/2022   CO2 25 11/21/2022   TSH 1.002 07/17/2021   INR 1.1 01/03/2021   HGBA1C 5.7 (H) 11/21/2022   MICROALBUR 6.5 (H) 09/26/2018     Assessment & Plan:   Problem List Items Addressed This Visit       Other   Musculoskeletal pain - Primary    Based on her exam, this appears to be muscular in nature.  Baclofen as directed.  If continues to persist, recommended following up with orthopedics.       Meds ordered this encounter  Medications   baclofen (LIORESAL) 10 MG tablet    Sig: Take 0.5-1 tablets (5-10 mg total) by mouth 3 (three) times daily as needed.    Dispense:  30 each    Refill:  0    Follow-up:  Return if symptoms worsen or fail to improve.  Everlene Other DO [facility]

## 2023-02-12 NOTE — Patient Instructions (Addendum)
Heat.  Medication as directed.  If persists, see Orthopedics (Dr. Romeo Apple)   Take care  Dr. Adriana Simas

## 2023-03-12 ENCOUNTER — Other Ambulatory Visit: Payer: Self-pay

## 2023-03-12 ENCOUNTER — Other Ambulatory Visit (HOSPITAL_COMMUNITY): Payer: Self-pay

## 2023-03-13 ENCOUNTER — Other Ambulatory Visit (HOSPITAL_COMMUNITY): Payer: Self-pay

## 2023-03-16 ENCOUNTER — Ambulatory Visit: Payer: Commercial Managed Care - PPO | Admitting: Nurse Practitioner

## 2023-03-16 VITALS — BP 122/78 | HR 87 | Temp 97.2°F | Ht 64.0 in | Wt 159.4 lb

## 2023-03-16 DIAGNOSIS — R300 Dysuria: Secondary | ICD-10-CM | POA: Diagnosis not present

## 2023-03-16 DIAGNOSIS — R590 Localized enlarged lymph nodes: Secondary | ICD-10-CM | POA: Diagnosis not present

## 2023-03-16 DIAGNOSIS — J300 Vasomotor rhinitis: Secondary | ICD-10-CM

## 2023-03-16 LAB — POCT URINALYSIS DIP (CLINITEK)
Bilirubin, UA: NEGATIVE
Blood, UA: NEGATIVE
Glucose, UA: NEGATIVE mg/dL
Ketones, POC UA: NEGATIVE mg/dL
Nitrite, UA: NEGATIVE
Spec Grav, UA: >=1.03 — AB (ref 1.010–1.025)
Urobilinogen, UA: 0.2 U/dL — AB
pH, UA: 6 (ref 5.0–8.0)

## 2023-03-16 MED ORDER — CEFDINIR 300 MG PO CAPS: 300.0000 mg | ORAL_CAPSULE | Freq: Two times a day (BID) | ORAL | 0 refills | Status: DC

## 2023-03-16 MED ORDER — METRONIDAZOLE 0.75 % VA GEL: VAGINAL | 0 refills | Status: DC

## 2023-03-17 ENCOUNTER — Encounter: Payer: Self-pay | Admitting: Nurse Practitioner

## 2023-03-17 DIAGNOSIS — R590 Localized enlarged lymph nodes: Secondary | ICD-10-CM | POA: Insufficient documentation

## 2023-03-17 LAB — CBC WITH DIFFERENTIAL/PLATELET
Basophils Absolute: 0 10*3/uL (ref 0.0–0.2)
Basos: 1 %
EOS (ABSOLUTE): 0.1 10*3/uL (ref 0.0–0.4)
Eos: 2 %
Hematocrit: 35.8 % (ref 34.0–46.6)
Hemoglobin: 11.9 g/dL (ref 11.1–15.9)
Immature Grans (Abs): 0 10*3/uL (ref 0.0–0.1)
Immature Granulocytes: 0 %
Lymphocytes Absolute: 1.6 10*3/uL (ref 0.7–3.1)
Lymphs: 44 %
MCH: 31.2 pg (ref 26.6–33.0)
MCHC: 33.2 g/dL (ref 31.5–35.7)
MCV: 94 fL (ref 79–97)
Monocytes Absolute: 0.4 10*3/uL (ref 0.1–0.9)
Monocytes: 11 %
Neutrophils Absolute: 1.6 10*3/uL (ref 1.4–7.0)
Neutrophils: 42 %
Platelets: 187 10*3/uL (ref 150–450)
RBC: 3.82 x10E6/uL (ref 3.77–5.28)
RDW: 12.5 % (ref 11.7–15.4)
WBC: 3.7 10*3/uL (ref 3.4–10.8)

## 2023-03-17 NOTE — Progress Notes (Signed)
Subjective:    Patient ID: Amy Dean, female    DOB: 18-Jun-1965, 57 y.o.   MRN: [id number]  HPI Presents for an evaluation of a small knot on the left side of her neck that she first noticed 5 days ago.  Nontender.  No fevers.  States her energy is off and on.  No sore throat.  Non-smoker.  No vaping.  No change in the size of the nodule.  Also complaining of pain in the mid to lower back area.  Occurring bilaterally.  Gets regular preventive health physicals with her gynecologist.  Has noticed decreased urination over the past 3 weeks.  Urgency.  No frequency.  Mainly dysuria.  No vaginal discharge.  Same sexual partner.  Denies concerns about STDs.  No pelvic pain.   Review of Systems  Constitutional:  Positive for fatigue. Negative for fever.       Off-and-on fatigue, not every day.  No unexplained fevers.  No unexplained weight loss.  HENT:  Positive for congestion and postnasal drip. Negative for ear pain and sore throat.   Respiratory:  Negative for cough, chest tightness, shortness of breath and wheezing.   Cardiovascular:  Negative for chest pain.  Genitourinary:  Positive for dysuria and urgency. Negative for enuresis, flank pain, frequency, hematuria, pelvic pain and vaginal discharge.      03/16/2023    3:22 PM  Depression screen PHQ 2/9  Decreased Interest 0  Down, Depressed, Hopeless 0  PHQ - 2 Score 0  Altered sleeping 0  Tired, decreased energy 0  Change in appetite 0  Feeling bad or failure about yourself  0  Trouble concentrating 0  Moving slowly or fidgety/restless 0  Suicidal thoughts 0  PHQ-9 Score 0  Difficult doing work/chores Not difficult at all      03/16/2023    3:22 PM 02/12/2023    1:16 PM 02/08/2023    8:44 AM 12/12/2022    8:30 AM  GAD 7 : Generalized Anxiety Score  Nervous, Anxious, on Edge 0 0 0 0  Control/stop worrying 0 0 0 0  Worry too much - different things 0 0 0 1  Trouble relaxing 0 0 0 0  Restless 0 0 0 0  Easily annoyed or  irritable 0 0 0 0  Afraid - awful might happen 0 0 0 0  Total GAD 7 Score 0 0 0 1  Anxiety Difficulty Not difficult at all Not difficult at all  Not difficult at all          Objective:   Physical Exam NAD.  Alert, oriented.  Calm cheerful affect.  TMs clear effusion, right side mildly injected.  Pharynx clear and moist.  Neck supple with minimal soft anterior cervical adenopathy.  1 tiny firm mobile lymph node noted along the superficial posterior cervical chain at the bottom of the neck.  No supraclavicular or subclavicular lymph nodes palpated.  Lungs clear.  Heart regular rate rhythm.  No CVA or flank tenderness.  Mild generalized tenderness in the lower thoracic upper lumbar area with palpation.  Abdomen soft nondistended with mild suprapubic pressure on exam.  No rebound or guarding.  No obvious masses.  POCT URINALYSIS DIP (CLINITEK)  Result Value Ref Range   Color, UA yellow yellow   Clarity, UA hazy (A) clear   Glucose, UA negative negative mg/dL   Bilirubin, UA negative negative   Ketones, POC UA negative negative mg/dL   Spec Grav, UA >=4.098 (A) 1.010 -  1.025   Blood, UA negative negative   pH, UA 6.0 5.0 - 8.0   POC PROTEIN,UA trace negative, trace   Urobilinogen, UA 0.2 (A) 0.2 or 1.0 E.U./dL   Nitrite, UA Negative Negative   Leukocytes, UA Small (1+) (A) Negative   Urine microscopic: Very poor sample with multiple epithelial cells, occasional clue cell with multiple WBCs including some clumps of WBCs. Today's Vitals   03/16/23 1522  BP: 122/78  Pulse: 87  Temp: (!) 97.2 F (36.2 C)  SpO2: 99%  Weight: 159 lb 6.4 oz (72.3 kg)  Height: 5\' 4"  (1.626 m)   Body mass index is 27.36 kg/m.          Assessment & Plan:   Problem List Items Addressed This Visit       Immune and Lymphatic   Cervical lymphadenopathy   Relevant Orders   CBC with Differential/Platelet (Completed)   Other Visit Diagnoses     Dysuria    -  Primary   Relevant Orders   POCT  URINALYSIS DIP (CLINITEK) (Completed)   Vasomotor rhinitis          Meds ordered this encounter  Medications   cefdinir (OMNICEF) 300 MG capsule    Sig: Take 1 capsule (300 mg total) by mouth 2 (two) times daily.    Dispense:  10 capsule    Refill:  0    Order Specific Question:   Supervising Provider    Answer:   Lilyan Punt A [[id number]]   metroNIDAZOLE (METROGEL) 0.75 % vaginal gel    Sig: Apply one applicator vaginally at bedtime x 5 days    Dispense:  70 g    Refill:  0    Order Specific Question:   Supervising Provider    Answer:   Lilyan Punt A [[id number]]   Lymph node is most likely reactive to vasomotor rhinitis symptoms.  Possibly calcified.  With no warning signs and no concerning signs on exam, advised patient to watch the area over time.  CBC ordered as a precaution.  Let us know if any changes. Start Omnicef as directed to cover possible UTI. Start metronidazole gel for probable BV based on present clue cells in urine. Warning signs reviewed.  Call back next week if no improvement, sooner if worse.

## 2023-04-04 ENCOUNTER — Other Ambulatory Visit (HOSPITAL_COMMUNITY): Payer: Self-pay | Admitting: Family Medicine

## 2023-04-04 DIAGNOSIS — Z1231 Encounter for screening mammogram for malignant neoplasm of breast: Secondary | ICD-10-CM

## 2023-04-11 ENCOUNTER — Other Ambulatory Visit (HOSPITAL_COMMUNITY): Payer: Self-pay

## 2023-04-14 ENCOUNTER — Other Ambulatory Visit: Payer: Self-pay | Admitting: Family Medicine

## 2023-04-14 ENCOUNTER — Other Ambulatory Visit (HOSPITAL_COMMUNITY): Payer: Self-pay

## 2023-04-16 ENCOUNTER — Other Ambulatory Visit: Payer: Self-pay

## 2023-04-16 ENCOUNTER — Other Ambulatory Visit (HOSPITAL_COMMUNITY): Payer: Self-pay

## 2023-04-16 MED ORDER — ALPRAZOLAM 0.5 MG PO TABS
0.5000 mg | ORAL_TABLET | Freq: Every evening | ORAL | 5 refills | Status: DC
  Filled 2023-04-16: qty 30, 30d supply, fill #0
  Filled 2023-05-18 (×2): qty 30, 30d supply, fill #1

## 2023-04-20 ENCOUNTER — Ambulatory Visit: Payer: Self-pay | Admitting: Family Medicine

## 2023-04-20 ENCOUNTER — Other Ambulatory Visit: Payer: Self-pay

## 2023-04-20 ENCOUNTER — Other Ambulatory Visit (HOSPITAL_COMMUNITY): Payer: Self-pay

## 2023-04-20 ENCOUNTER — Ambulatory Visit: Payer: Commercial Managed Care - PPO | Admitting: Physician Assistant

## 2023-04-20 ENCOUNTER — Encounter: Payer: Self-pay | Admitting: Physician Assistant

## 2023-04-20 VITALS — BP 133/76 | HR 80 | Ht 64.0 in | Wt 157.6 lb

## 2023-04-20 DIAGNOSIS — E1169 Type 2 diabetes mellitus with other specified complication: Secondary | ICD-10-CM

## 2023-04-20 DIAGNOSIS — E119 Type 2 diabetes mellitus without complications: Secondary | ICD-10-CM

## 2023-04-20 DIAGNOSIS — I10 Essential (primary) hypertension: Secondary | ICD-10-CM | POA: Diagnosis not present

## 2023-04-20 DIAGNOSIS — R42 Dizziness and giddiness: Secondary | ICD-10-CM | POA: Diagnosis not present

## 2023-04-20 MED ORDER — LANCETS MISC. MISC: 1.0000 | Freq: Three times a day (TID) | 0 refills | Status: AC

## 2023-04-20 MED ORDER — LANCET DEVICE MISC: 1.0000 | Freq: Three times a day (TID) | 0 refills | Status: AC

## 2023-04-20 MED ORDER — BLOOD PRESSURE MONITOR 3 DEVI: 1.0000 | 0 refills | Status: AC | PRN

## 2023-04-20 MED ORDER — BLOOD GLUCOSE TEST VI STRP: 1.0000 | ORAL_STRIP | Freq: Three times a day (TID) | 10 refills | Status: AC

## 2023-04-20 MED ORDER — BLOOD GLUCOSE MONITORING SUPPL DEVI: 1.0000 | Freq: Three times a day (TID) | 0 refills | Status: AC
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
## 2023-04-20 NOTE — Telephone Encounter (Signed)
Has appointment today with Toni Amend Grooms PA
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
## 2023-05-04 ENCOUNTER — Ambulatory Visit (HOSPITAL_COMMUNITY)
Admission: RE | Admit: 2023-05-04 | Discharge: 2023-05-04 | Disposition: A | Discharge status: Home / Self Care | Payer: Commercial Managed Care - PPO | Source: Ambulatory Visit | Attending: Family Medicine | Admitting: Family Medicine

## 2023-05-04 DIAGNOSIS — Z1231 Encounter for screening mammogram for malignant neoplasm of breast: Secondary | ICD-10-CM | POA: Diagnosis present

## 2023-05-07 ENCOUNTER — Other Ambulatory Visit (HOSPITAL_COMMUNITY): Payer: Self-pay

## 2023-05-07 ENCOUNTER — Other Ambulatory Visit (INDEPENDENT_AMBULATORY_CARE_PROVIDER_SITE_OTHER): Payer: Self-pay

## 2023-05-07 ENCOUNTER — Ambulatory Visit: Payer: Commercial Managed Care - PPO | Admitting: Orthopedic Surgery

## 2023-05-07 ENCOUNTER — Other Ambulatory Visit: Payer: Self-pay

## 2023-05-07 ENCOUNTER — Encounter: Payer: Self-pay | Admitting: Orthopedic Surgery

## 2023-05-07 VITALS — Ht 62.0 in | Wt 164.0 lb

## 2023-05-07 DIAGNOSIS — M545 Low back pain, unspecified: Secondary | ICD-10-CM

## 2023-05-07 DIAGNOSIS — M47816 Spondylosis without myelopathy or radiculopathy, lumbar region: Secondary | ICD-10-CM

## 2023-05-07 MED ORDER — GABAPENTIN 100 MG PO CAPS
100.0000 mg | ORAL_CAPSULE | Freq: Every day | ORAL | 2 refills | Status: DC
  Filled 2023-05-07: qty 30, 30d supply, fill #0

## 2023-05-07 MED ORDER — GABAPENTIN 100 MG PO CAPS
100.0000 mg | ORAL_CAPSULE | Freq: Every day | ORAL | 2 refills | Status: DC
  Filled 2023-05-07: qty 30, 30d supply, fill #0
  Filled 2023-06-09: qty 30, 30d supply, fill #1
  Filled 2023-08-02: qty 30, 30d supply, fill #2

## 2023-05-07 NOTE — Progress Notes (Signed)
Chief Complaint  Patient presents with   Hip Pain    L/ hurting some   57 year old female presumptive diagnosis greater trochanteric bursitis with 2 injections still having left-sided hip pain over the greater trochanter some up into the left buttock and some down the left lateral thigh  Reevaluation reveals there is some lower back tenderness some tenderness along the lateral thigh as well as over the greater trochanter  Spinal imaging shows mild arthritis in the lower back  Encounter Diagnoses  Name Primary?   Left-sided low back pain without sciatica, unspecified chronicity    Facet arthritis of lumbar region-L5 Yes    Meds ordered this encounter  Medications   DISCONTD: gabapentin (NEURONTIN) 100 MG capsule    Sig: Take 1 capsule (100 mg total) by mouth at bedtime.    Dispense:  30 capsule    Refill:  2   gabapentin (NEURONTIN) 100 MG capsule    Sig: Take 1 capsule (100 mg total) by mouth at bedtime.    Dispense:  30 capsule    Refill:  2    6  week follow-up

## 2023-05-10 ENCOUNTER — Other Ambulatory Visit: Payer: Self-pay

## 2023-05-17 ENCOUNTER — Other Ambulatory Visit (HOSPITAL_COMMUNITY): Payer: Self-pay

## 2023-05-18 ENCOUNTER — Other Ambulatory Visit (HOSPITAL_COMMUNITY): Payer: Self-pay

## 2023-05-21 DIAGNOSIS — H524 Presbyopia: Secondary | ICD-10-CM | POA: Diagnosis not present

## 2023-05-21 LAB — HM DIABETES EYE EXAM

## 2023-05-25 ENCOUNTER — Telehealth: Payer: Self-pay | Admitting: Family Medicine

## 2023-05-25 ENCOUNTER — Other Ambulatory Visit: Payer: Self-pay

## 2023-05-25 DIAGNOSIS — Z79899 Other long term (current) drug therapy: Secondary | ICD-10-CM

## 2023-05-25 DIAGNOSIS — I10 Essential (primary) hypertension: Secondary | ICD-10-CM

## 2023-05-25 DIAGNOSIS — E119 Type 2 diabetes mellitus without complications: Secondary | ICD-10-CM

## 2023-05-25 DIAGNOSIS — E1169 Type 2 diabetes mellitus with other specified complication: Secondary | ICD-10-CM

## 2023-05-25 NOTE — Telephone Encounter (Signed)
Nurses Patient has a office visit coming up in February Needs to do lipid, liver, metabolic 7, A1c, urine ACR Diagnosis diabetes hyperlipidemia Please connect with patient make sure she does her lab work before her office visit thank you

## 2023-06-09 ENCOUNTER — Other Ambulatory Visit: Payer: Self-pay | Admitting: Family Medicine

## 2023-06-09 ENCOUNTER — Other Ambulatory Visit (HOSPITAL_COMMUNITY): Payer: Self-pay

## 2023-06-11 ENCOUNTER — Other Ambulatory Visit: Payer: Self-pay

## 2023-06-11 ENCOUNTER — Other Ambulatory Visit (HOSPITAL_COMMUNITY): Payer: Self-pay

## 2023-06-11 ENCOUNTER — Other Ambulatory Visit (HOSPITAL_BASED_OUTPATIENT_CLINIC_OR_DEPARTMENT_OTHER): Payer: Self-pay

## 2023-06-11 MED ORDER — TRIAMCINOLONE ACETONIDE 0.1 % EX CREA
TOPICAL_CREAM | CUTANEOUS | 1 refills | Status: DC
  Filled 2023-06-11: qty 45, 30d supply, fill #0
  Filled 2023-08-02: qty 45, 30d supply, fill #1

## 2023-06-13 ENCOUNTER — Encounter: Payer: Self-pay | Admitting: Family Medicine

## 2023-06-13 LAB — BASIC METABOLIC PANEL
BUN/Creatinine Ratio: 23 (ref 9–23)
BUN: 15 mg/dL (ref 6–24)
CO2: 23 mmol/L (ref 20–29)
Calcium: 10.2 mg/dL (ref 8.7–10.2)
Chloride: 100 mmol/L (ref 96–106)
Creatinine, Ser: 0.66 mg/dL (ref 0.57–1.00)
Glucose: 78 mg/dL (ref 70–99)
Potassium: 3.7 mmol/L (ref 3.5–5.2)
Sodium: 140 mmol/L (ref 134–144)
eGFR: 102 mL/min/{1.73_m2} (ref >59)

## 2023-06-13 LAB — MICROALBUMIN / CREATININE URINE RATIO
Creatinine, Urine: 100.8 mg/dL
Microalb/Creat Ratio: 7 mg/g{creat} (ref 0–29)
Microalbumin, Urine: 7.3 ug/mL

## 2023-06-13 LAB — HEPATIC FUNCTION PANEL
ALT: 18 [IU]/L (ref 0–32)
AST: 22 [IU]/L (ref 0–40)
Albumin: 4.8 g/dL (ref 3.8–4.9)
Alkaline Phosphatase: 62 [IU]/L (ref 44–121)
Bilirubin Total: 0.6 mg/dL (ref 0.0–1.2)
Bilirubin, Direct: 0.19 mg/dL (ref 0.00–0.40)
Total Protein: 7.4 g/dL (ref 6.0–8.5)

## 2023-06-13 LAB — LIPID PANEL
Chol/HDL Ratio: 2.8 {ratio} (ref 0.0–4.4)
Cholesterol, Total: 178 mg/dL (ref 100–199)
HDL: 63 mg/dL (ref >39)
LDL Chol Calc (NIH): 93 mg/dL (ref 0–99)
Triglycerides: 125 mg/dL (ref 0–149)
VLDL Cholesterol Cal: 22 mg/dL (ref 5–40)

## 2023-06-13 LAB — HEMOGLOBIN A1C
Est. average glucose Bld gHb Est-mCnc: 100 mg/dL
Hgb A1c MFr Bld: 5.1 % (ref 4.8–5.6)

## 2023-06-14 ENCOUNTER — Ambulatory Visit: Payer: Commercial Managed Care - PPO | Admitting: Family Medicine

## 2023-06-14 ENCOUNTER — Other Ambulatory Visit (HOSPITAL_COMMUNITY): Payer: Self-pay

## 2023-06-14 ENCOUNTER — Other Ambulatory Visit: Payer: Self-pay

## 2023-06-14 VITALS — BP 115/79 | HR 51 | Temp 97.6°F | Ht 62.0 in | Wt 155.0 lb

## 2023-06-14 DIAGNOSIS — Z7984 Long term (current) use of oral hypoglycemic drugs: Secondary | ICD-10-CM

## 2023-06-14 DIAGNOSIS — E785 Hyperlipidemia, unspecified: Secondary | ICD-10-CM

## 2023-06-14 DIAGNOSIS — I10 Essential (primary) hypertension: Secondary | ICD-10-CM | POA: Diagnosis not present

## 2023-06-14 DIAGNOSIS — E1169 Type 2 diabetes mellitus with other specified complication: Secondary | ICD-10-CM | POA: Diagnosis not present

## 2023-06-14 DIAGNOSIS — Z23 Encounter for immunization: Secondary | ICD-10-CM | POA: Diagnosis not present

## 2023-06-14 DIAGNOSIS — Z79899 Other long term (current) drug therapy: Secondary | ICD-10-CM

## 2023-06-14 DIAGNOSIS — L209 Atopic dermatitis, unspecified: Secondary | ICD-10-CM | POA: Diagnosis not present

## 2023-06-14 DIAGNOSIS — E119 Type 2 diabetes mellitus without complications: Secondary | ICD-10-CM

## 2023-06-14 MED ORDER — PANTOPRAZOLE SODIUM 40 MG PO TBEC
40.0000 mg | DELAYED_RELEASE_TABLET | Freq: Every day | ORAL | 3 refills | Status: DC
  Filled 2023-06-14: qty 90, 90d supply, fill #0
  Filled 2023-09-10: qty 90, 90d supply, fill #1
  Filled 2024-01-21: qty 90, 90d supply, fill #0
  Filled 2024-04-22: qty 90, 90d supply, fill #1

## 2023-06-14 MED ORDER — EZETIMIBE 10 MG PO TABS
10.0000 mg | ORAL_TABLET | Freq: Every day | ORAL | 3 refills | Status: DC
  Filled 2023-06-14: qty 90, 90d supply, fill #0
  Filled 2023-09-10: qty 90, 90d supply, fill #1

## 2023-06-14 MED ORDER — METFORMIN HCL 500 MG PO TABS
500.0000 mg | ORAL_TABLET | Freq: Every morning | ORAL | 1 refills | Status: DC
  Filled 2023-06-14 – 2023-08-02 (×2): qty 90, 90d supply, fill #0
  Filled 2023-11-19: qty 90, 90d supply, fill #1

## 2023-06-14 MED ORDER — ROSUVASTATIN CALCIUM 40 MG PO TABS
40.0000 mg | ORAL_TABLET | Freq: Every day | ORAL | 1 refills | Status: DC
  Filled 2023-06-14 – 2023-08-02 (×2): qty 90, 90d supply, fill #0
  Filled 2023-11-19: qty 90, 90d supply, fill #1

## 2023-06-14 MED ORDER — MOMETASONE FUROATE 0.1 % EX CREA
TOPICAL_CREAM | Freq: Two times a day (BID) | CUTANEOUS | 1 refills | Status: DC | PRN
  Filled 2023-06-14: qty 15, 7d supply, fill #0
  Filled 2023-07-09 (×2): qty 15, 7d supply, fill #1

## 2023-06-14 MED ORDER — ALPRAZOLAM 0.5 MG PO TABS
0.5000 mg | ORAL_TABLET | Freq: Every evening | ORAL | 5 refills | Status: DC
  Filled 2023-06-14 – 2023-06-21 (×2): qty 30, 30d supply, fill #0
  Filled 2023-08-05: qty 30, 30d supply, fill #1
  Filled 2023-09-10: qty 30, 30d supply, fill #2
  Filled 2023-10-18: qty 30, 30d supply, fill #3
  Filled 2023-11-19: qty 30, 30d supply, fill #4

## 2023-06-14 MED ORDER — AMLODIPINE BESYLATE 5 MG PO TABS
5.0000 mg | ORAL_TABLET | Freq: Every day | ORAL | 1 refills | Status: DC
Start: 2023-06-14 — End: 2023-12-12
  Filled 2023-06-14: qty 90, 90d supply, fill #0
  Filled 2023-09-10: qty 90, 90d supply, fill #1

## 2023-06-14 MED ORDER — LISINOPRIL 10 MG PO TABS
10.0000 mg | ORAL_TABLET | Freq: Every day | ORAL | 1 refills | Status: DC
  Filled 2023-06-14: qty 90, 90d supply, fill #0
  Filled 2023-09-10: qty 90, 90d supply, fill #1

## 2023-06-14 MED ORDER — TIRZEPATIDE 15 MG/0.5ML ~~LOC~~ SOAJ
15.0000 mg | SUBCUTANEOUS | 5 refills | Status: DC
  Filled 2023-06-14: qty 2, 28d supply, fill #0
  Filled 2023-07-09 (×2): qty 2, 28d supply, fill #1
  Filled 2023-08-02: qty 2, 28d supply, fill #2
  Filled 2023-09-10: qty 2, 28d supply, fill #3
  Filled 2023-10-18: qty 2, 28d supply, fill #4
  Filled 2023-11-19: qty 2, 28d supply, fill #5

## 2023-06-14 NOTE — Progress Notes (Signed)
 Subjective:    Patient ID: Amy Dean, female    DOB: 02/12/1966, 58 y.o.   MRN: [id number]  Discussed the use of AI scribe software for clinical note transcription with the patient, who gave verbal consent to proceed.  History of Present Illness   Amy Dean is a 58 year old female with diabetes and hypertension who presents for medication management and follow-up.  She manages her diabetes with Mounjaro  and metformin , experiencing good tolerance without significant side effects such as vomiting or severe diarrhea. Her appetite is reduced, consistent with Mounjaro 's effects, and her A1c is well-controlled at 5.1.  She experiences occasional dizzy spells, described as lightheadedness when standing up from a sitting position or during activities. These episodes resolve when she sits down. She is currently on amlodipine  10 mg and lisinopril  HCTZ 10/12.5 mg for hypertension.  Her LDL cholesterol is 93, and she takes her cholesterol medication regularly.  She has rough, itchy, hypopigmented patches on her legs and neck. She has not seen a dermatologist for these skin issues.  In terms of lifestyle, she finds it challenging to fit in walking for exercise due to her schedule but anticipates improvement as the weather warms up. She tries to maintain smart eating habits, although some days are better than others. She occasionally makes smoothies for breakfast and avoids fried and fatty foods.         Review of Systems     Objective:    Physical Exam   VITALS: BP- 108/62 sitting, 90/60 standing EXTREMITIES: No ankle edema NEUROLOGICAL: Intact sensation to monofilament on feet SKIN: Hypopigmented, rough, pruritic patches on legs and neck           Assessment & Plan:  Assessment and Plan    Orthostatic Hypotension Occasional lightheadedness upon standing. Discussed the possibility of reducing antihypertensive medications due to weight loss and potential  over-treatment. -Reduce Amlodipine  from 10mg  to 5mg  daily. -Reduce Lisinopril  HCTZ from 10-12.5mg  to 10mg  daily. -Monitor blood pressure and report any persistent dizziness.  Hyperlipidemia LDL cholesterol at 93, goal is less than 70. Discussed the addition of Ezetimibe  to further lower LDL and reduce cardiovascular risk. -Add Ezetimibe  to current regimen. -Continue current statin therapy.  Type 2 Diabetes Mellitus Well-controlled with A1c at 5.1. Discussed the potential need to reduce Mounjaro  dose if weight continues to decrease significantly. -Continue current regimen of Metformin  and Mounjaro . -Monitor weight and consider reducing Mounjaro  dose if significant weight loss continues.  Atopic Dermatitis Multiple hyperpigmented patches on skin, causing occasional itching. Discussed the plan to prescribe a topical cream and refer to dermatology for long-term management. -Prescribe topical cream for use twice daily as needed (avoiding face, underarms, and groin). -Refer to dermatology for further evaluation and management.  General Health Maintenance -Administer pneumococcal vaccine booster today. -Encourage completion of shingles vaccine series. -Encourage healthy eating habits and regular exercise. -Consider gradual reduction of Alprazolam  use due to potential long-term risks. -Check labs in 6 months. -Report blood pressure readings in 6 weeks.      1. Essential hypertension (Primary) Blood pressure on the lower end of normal.  Needs adjusting the medicine please see above reduce amlodipine  and also reduced lisinopril  lab work before next visit - Basic Metabolic Panel  2. Controlled type 2 diabetes mellitus without complication, without long-term current use of insulin  (HCC) A1c excellent currently where it was subpar continue metformin  continue healthy diet continue Mounjaro  monitor weight if it starts going below 150 reduce the Mounjaro  - Hemoglobin  A1c  3. Hyperlipidemia  associated with type 2 diabetes mellitus (HCC) Add Zetia  goal is to get LDL below 70 labs before next visit - Lipid Panel  4. Atopic dermatitis, unspecified type Elocon  cream not for the face or groin twice daily as needed referral to dermatology for their input - Ambulatory referral to Dermatology  5. High risk medication use Lab ordered - Hepatic Function Panel  6. Immunization due Today - Pneumococcal conjugate vaccine 20-valent (Prevnar 20)  Follow-up within 6 months

## 2023-06-15 LAB — HEPATIC FUNCTION PANEL
ALT: 15 [IU]/L (ref 0–32)
AST: 22 [IU]/L (ref 0–40)
Albumin: 4.9 g/dL (ref 3.8–4.9)
Alkaline Phosphatase: 67 [IU]/L (ref 44–121)
Bilirubin Total: 0.4 mg/dL (ref 0.0–1.2)
Bilirubin, Direct: 0.17 mg/dL (ref 0.00–0.40)
Total Protein: 7.4 g/dL (ref 6.0–8.5)

## 2023-06-15 LAB — BASIC METABOLIC PANEL
BUN/Creatinine Ratio: 22 (ref 9–23)
BUN: 14 mg/dL (ref 6–24)
CO2: 25 mmol/L (ref 20–29)
Calcium: 10.2 mg/dL (ref 8.7–10.2)
Chloride: 103 mmol/L (ref 96–106)
Creatinine, Ser: 0.64 mg/dL (ref 0.57–1.00)
Glucose: 84 mg/dL (ref 70–99)
Potassium: 4 mmol/L (ref 3.5–5.2)
Sodium: 143 mmol/L (ref 134–144)
eGFR: 102 mL/min/{1.73_m2} (ref >59)

## 2023-06-15 LAB — HEMOGLOBIN A1C
Est. average glucose Bld gHb Est-mCnc: 100 mg/dL
Hgb A1c MFr Bld: 5.1 % (ref 4.8–5.6)

## 2023-06-15 LAB — LIPID PANEL
Chol/HDL Ratio: 2.6 {ratio} (ref 0.0–4.4)
Cholesterol, Total: 175 mg/dL (ref 100–199)
HDL: 68 mg/dL (ref >39)
LDL Chol Calc (NIH): 92 mg/dL (ref 0–99)
Triglycerides: 84 mg/dL (ref 0–149)
VLDL Cholesterol Cal: 15 mg/dL (ref 5–40)

## 2023-06-15 NOTE — Progress Notes (Deleted)
   LMP 02/10/2011   There is no height or weight on file to calculate BMI.  No chief complaint on file.   No diagnosis found.  DOI/DOS/ {Date:23773::"***"}  {CHL AMB ORT SYMPTOMS POST TREATMENT:21798}

## 2023-06-18 ENCOUNTER — Ambulatory Visit: Payer: Commercial Managed Care - PPO | Admitting: Orthopedic Surgery

## 2023-06-18 ENCOUNTER — Encounter: Payer: Self-pay | Admitting: Family Medicine

## 2023-06-19 NOTE — Progress Notes (Unsigned)
   LMP 02/10/2011   There is no height or weight on file to calculate BMI.  No chief complaint on file.   No diagnosis found.  DOI/DOS/ Date: NA  {CHL AMB ORT SYMPTOMS POST TREATMENT:21798}

## 2023-06-21 ENCOUNTER — Ambulatory Visit: Payer: Commercial Managed Care - PPO | Admitting: Orthopedic Surgery

## 2023-06-21 ENCOUNTER — Encounter: Payer: Self-pay | Admitting: Orthopedic Surgery

## 2023-06-21 VITALS — BP 124/73 | HR 75 | Ht 62.0 in | Wt 158.0 lb

## 2023-06-21 DIAGNOSIS — M47816 Spondylosis without myelopathy or radiculopathy, lumbar region: Secondary | ICD-10-CM

## 2023-06-21 NOTE — Progress Notes (Signed)
Follow-up visit  Encounter Diagnosis  Name Primary?   Facet arthritis of lumbar region-L5 Yes    Chief Complaint  Patient presents with   Hip Pain    6 wk fu on the left hip patient says she is doing better with the medication     The knees has improved with gabapentin takes on an as-needed basis.  Just has a little bit of residual symptoms in the left.  On exam no palpable tenderness normal range of motion good gait  Seems to be doing well continue medication on an as-needed basis  Follow-up as needed

## 2023-06-22 ENCOUNTER — Other Ambulatory Visit (HOSPITAL_COMMUNITY): Payer: Self-pay

## 2023-06-22 ENCOUNTER — Other Ambulatory Visit: Payer: Self-pay

## 2023-06-25 ENCOUNTER — Ambulatory Visit: Payer: Commercial Managed Care - PPO | Admitting: Nurse Practitioner

## 2023-06-25 ENCOUNTER — Telehealth: Payer: Self-pay

## 2023-06-25 ENCOUNTER — Ambulatory Visit: Payer: Self-pay | Admitting: Family Medicine

## 2023-06-25 ENCOUNTER — Other Ambulatory Visit: Payer: Self-pay

## 2023-06-25 VITALS — BP 126/70 | HR 80 | Temp 97.9°F | Ht 62.0 in | Wt 157.0 lb

## 2023-06-25 DIAGNOSIS — N760 Acute vaginitis: Secondary | ICD-10-CM

## 2023-06-25 DIAGNOSIS — E1169 Type 2 diabetes mellitus with other specified complication: Secondary | ICD-10-CM

## 2023-06-25 DIAGNOSIS — R319 Hematuria, unspecified: Secondary | ICD-10-CM | POA: Diagnosis not present

## 2023-06-25 DIAGNOSIS — B9689 Other specified bacterial agents as the cause of diseases classified elsewhere: Secondary | ICD-10-CM

## 2023-06-25 DIAGNOSIS — R102 Pelvic and perineal pain: Secondary | ICD-10-CM

## 2023-06-25 DIAGNOSIS — Z79899 Other long term (current) drug therapy: Secondary | ICD-10-CM

## 2023-06-25 DIAGNOSIS — E119 Type 2 diabetes mellitus without complications: Secondary | ICD-10-CM

## 2023-06-25 LAB — POCT URINALYSIS DIP (CLINITEK)
Bilirubin, UA: NEGATIVE
Blood, UA: NEGATIVE
Glucose, UA: NEGATIVE mg/dL
Ketones, POC UA: NEGATIVE mg/dL
Nitrite, UA: NEGATIVE
POC PROTEIN,UA: NEGATIVE
Spec Grav, UA: 1.015 (ref 1.010–1.025)
Urobilinogen, UA: 0.2 U/dL
pH, UA: 6 (ref 5.0–8.0)

## 2023-06-25 NOTE — Telephone Encounter (Signed)
Message left for patient , need to schedule the vaginal ultrasound and will need to know if she has a time or day preference.

## 2023-06-25 NOTE — Telephone Encounter (Signed)
Copied from CRM [id number]. Topic: Clinical - Red Word Triage >> Jun 25, 2023  9:59 AM Gildardo Pounds wrote: Red Word that prompted transfer to Nurse Triage: Patient saw blood when wiping after urination that started Friday. Callback number is [phone]  Chief Complaint: Hematuria Symptoms: Hematuria, discomfort in vagina Frequency: Friday Pertinent Negatives: Patient denies burring, fever, flank pain Disposition: [] ED /[] Urgent Care (no appt availability in office) / [x] Appointment(In office/virtual)/ []  [facility]/ [] Home Care/ [] Refused Recommended Disposition /[] [facility]/ []  Follow-up with PCP Additional Notes: Patient called in to report that she saw blood when wiping after using the bathroom last Friday. Patient stated her urine was also tinted orange at that time. Patient stated that she has not seen blood in her urine since Friday. Patient stated she is having vaginal discomfort and described it as a "pulling" sensation in her vagina. Patient denied fever and flank pain. Patient denied burning while urinating, frequency and urgency. Patient denied swelling, itching and redness of her vagina. This RN advised patient to be seen today. No availability with PCP. Scheduled patient in office with alternate provider. This RN advised patient to call back if symptoms worsen. Patient complied.   Reason for Disposition  Blood in urine  (Exception: Could be normal menstrual bleeding.)  Answer Assessment - Initial Assessment Questions 1. COLOR of URINE: "Describe the color of the urine."  (e.g., tea-colored, pink, red, bloody) "Do you have blood clots in your urine?" (e.g., none, pea, grape, small coin)     Urine is tinted orange, sees blood on tissue when wiping 2. ONSET: "When did the bleeding start?"      Friday 3. EPISODES: "How many times has there been blood in the urine?" or "How many times today?"     Just once on Friday 4. PAIN with URINATION: "Is there any pain with  passing your urine?" If Yes, ask: "How bad is the pain?"  (Scale 1-10; or mild, moderate, severe)    - MILD: Complains slightly about urination hurting.    - MODERATE: Interferes with normal activities.      - SEVERE: Excruciating, unwilling or unable to urinate because of the pain.      Denies pain or burning while urinating 5. FEVER: "Do you have a fever?" If Yes, ask: "What is your temperature, how was it measured, and when did it start?"     Denies 6. ASSOCIATED SYMPTOMS: "Are you passing urine more frequently than usual?"     Denies having to use the bathroom more frequently, denies unusual urge to urinate 7. OTHER SYMPTOMS: "Do you have any other symptoms?" (e.g., back/flank pain, abdomen pain, vomiting)     Denies abdominal and flank pain, states she feels a "pulling" sensation in her vagina, denies swelling or redness of vagina, denies itching 8. PREGNANCY: "Is there any chance you are pregnant?" "When was your last menstrual period?"     Denies  Protocols used: Urine - Blood In-A-AH

## 2023-06-27 ENCOUNTER — Encounter: Payer: Self-pay | Admitting: Nurse Practitioner

## 2023-06-27 DIAGNOSIS — R102 Pelvic and perineal pain unspecified side: Secondary | ICD-10-CM | POA: Insufficient documentation

## 2023-06-27 LAB — POCT WET PREP WITH KOH
KOH Prep POC: NEGATIVE
RBC Wet Prep HPF POC: NEGATIVE
Trichomonas, UA: NEGATIVE
Yeast Wet Prep HPF POC: NEGATIVE
pH, Wet Prep: 5

## 2023-06-27 MED ORDER — METRONIDAZOLE 500 MG PO TABS: 500.0000 mg | ORAL_TABLET | Freq: Two times a day (BID) | ORAL | 0 refills | Status: DC

## 2023-06-27 NOTE — Progress Notes (Signed)
Subjective:    Patient ID: Amy Dean, female    DOB: May 13, 1965, 58 y.o.   MRN: [id number]  HPI Presents for complaints of blood with wiping after urination that occurred 1 time 3 days ago.  None since.  Has had some right low back pain.  Complaints of "a tugging feeling" in the pelvic area.  No blood in the urine in the toilet, describes it as slightly dark.  No dysuria.  No incontinence.  Slight frequency and urgency.  No actual pelvic pain.  Patient had a supracervical abdominal hysterectomy in 2012.  Still has her ovaries and fallopian tubes.  No vaginal discharge.  Same female sexual partner.  Defers need for STD testing.  No vaginal discharge.  No fever.   Review of Systems  Constitutional:  Negative for fever.  Respiratory:  Negative for cough, chest tightness and shortness of breath.   Cardiovascular:  Negative for chest pain.  Genitourinary:  Positive for frequency. Negative for dysuria, enuresis, flank pain, pelvic pain, urgency and vaginal discharge.       Patient is unsure if the blood was associated with urination or vaginal.      Physical Exam NAD.  Alert, oriented.  Lungs clear.  Heart regular rate rhythm.  No CVA tenderness to percussion.  Abdomen soft nondistended with right sided discomfort.  External GU no rashes lesions irritation or active bleeding.  Vagina a small amount of mucoid discharge with faint yellowish color.  Cervix normal limit in appearance.  No lesions or bleeding noted.  No CMT.  Tenderness noted with palpation of the right pelvic area.  Significant scar tissue noted from previous surgery so unclear if a mass is present.  No rebound or guarding.   Today's Vitals   06/25/23 1507  BP: 126/70  Pulse: 80  Temp: 97.9 F (36.6 C)  SpO2: 98%  Weight: 157 lb (71.2 kg)  Height: 5\' 2"  (1.575 m)   Body mass index is 28.72 kg/m.  Results for orders placed or performed in visit on 06/25/23  POCT URINALYSIS DIP (CLINITEK)   Collection Time: 06/25/23   3:22 PM  Result Value Ref Range   Color, UA yellow yellow   Clarity, UA clear clear   Glucose, UA negative negative mg/dL   Bilirubin, UA negative negative   Ketones, POC UA negative negative mg/dL   Spec Grav, UA 6.440 3.474 - 1.025   Blood, UA negative negative   pH, UA 6.0 5.0 - 8.0   POC PROTEIN,UA negative negative, trace   Urobilinogen, UA 0.2 0.2 or 1.0 E.U./dL   Nitrite, UA Negative Negative   Leukocytes, UA Trace (A) Negative  POCT Wet Prep with KOH   Collection Time: 06/27/23  9:49 AM  Result Value Ref Range   Trichomonas, UA Negative    Clue Cells Wet Prep HPF POC rare    Epithelial Wet Prep HPF POC Few Few, Moderate, Many, Too numerous to count   Yeast Wet Prep HPF POC neg    Bacteria Wet Prep HPF POC None (A) Few   RBC Wet Prep HPF POC neg    WBC Wet Prep HPF POC few    KOH Prep POC Negative Negative   pH, Wet Prep 5.0          Assessment & Plan:   Problem List Items Addressed This Visit       Other   Pelvic pain - Primary   Relevant Orders   US Pelvic Complete With Transvaginal  POCT Wet Prep with KOH (Completed)   Other Visit Diagnoses       Hematuria, unspecified type       Relevant Orders   POCT URINALYSIS DIP (CLINITEK) (Completed)     Bacterial vaginosis       Relevant Orders   POCT Wet Prep with KOH (Completed)      Meds ordered this encounter  Medications   metroNIDAZOLE (FLAGYL) 500 MG tablet    Sig: Take 1 tablet (500 mg total) by mouth 2 (two) times daily with a meal.    Dispense:  14 tablet    Refill:  0    Supervising Provider:   Lilyan Punt A [[id number]]   Start metronidazole as directed. Urine is clear today.  Based on the examination, feel the bleeding may have been due to BV. Due to the finding of pelvic pain on exam, will order a pelvic ultrasound.  Further follow-up based on results.  Also encourage patient to get a preventive health physical. Call back if any further bleeding is noted or if worsening pelvic pain.

## 2023-06-28 ENCOUNTER — Other Ambulatory Visit (HOSPITAL_COMMUNITY): Payer: Self-pay

## 2023-07-02 ENCOUNTER — Ambulatory Visit (HOSPITAL_COMMUNITY)
Admission: RE | Admit: 2023-07-02 | Discharge: 2023-07-02 | Disposition: A | Discharge status: Home / Self Care | Payer: Commercial Managed Care - PPO | Source: Ambulatory Visit | Attending: Nurse Practitioner | Admitting: Nurse Practitioner

## 2023-07-02 DIAGNOSIS — R102 Pelvic and perineal pain: Secondary | ICD-10-CM | POA: Insufficient documentation

## 2023-07-04 ENCOUNTER — Encounter: Payer: Self-pay | Admitting: Nurse Practitioner

## 2023-07-09 ENCOUNTER — Other Ambulatory Visit (HOSPITAL_COMMUNITY): Payer: Self-pay

## 2023-07-10 ENCOUNTER — Other Ambulatory Visit: Payer: Self-pay

## 2023-08-02 ENCOUNTER — Other Ambulatory Visit (HOSPITAL_COMMUNITY): Payer: Self-pay

## 2023-08-02 ENCOUNTER — Other Ambulatory Visit: Payer: Self-pay

## 2023-08-06 ENCOUNTER — Other Ambulatory Visit (HOSPITAL_COMMUNITY): Payer: Self-pay

## 2023-08-06 ENCOUNTER — Other Ambulatory Visit: Payer: Self-pay

## 2023-09-01 ENCOUNTER — Other Ambulatory Visit (HOSPITAL_COMMUNITY): Payer: Self-pay

## 2023-09-01 LAB — AMB RESULTS CONSOLE CBG: Glucose: 65

## 2023-09-04 NOTE — Progress Notes (Signed)
 SDOH not assessed

## 2023-09-10 ENCOUNTER — Other Ambulatory Visit (HOSPITAL_COMMUNITY): Payer: Self-pay

## 2023-09-11 ENCOUNTER — Other Ambulatory Visit: Payer: Self-pay

## 2023-10-18 ENCOUNTER — Other Ambulatory Visit (HOSPITAL_COMMUNITY): Payer: Self-pay

## 2023-10-18 ENCOUNTER — Other Ambulatory Visit: Payer: Self-pay

## 2023-10-18 ENCOUNTER — Other Ambulatory Visit: Payer: Self-pay | Admitting: Family Medicine

## 2023-10-18 ENCOUNTER — Other Ambulatory Visit: Payer: Self-pay | Admitting: Orthopedic Surgery

## 2023-10-18 DIAGNOSIS — M47816 Spondylosis without myelopathy or radiculopathy, lumbar region: Secondary | ICD-10-CM

## 2023-10-22 ENCOUNTER — Other Ambulatory Visit (HOSPITAL_COMMUNITY): Payer: Self-pay

## 2023-10-22 ENCOUNTER — Other Ambulatory Visit: Payer: Self-pay

## 2023-10-22 MED ORDER — TRIAMCINOLONE ACETONIDE 0.1 % EX CREA
TOPICAL_CREAM | CUTANEOUS | 1 refills | Status: DC
  Filled 2023-10-22: qty 45, 20d supply, fill #0
  Filled 2023-11-19: qty 45, 20d supply, fill #1

## 2023-10-24 ENCOUNTER — Other Ambulatory Visit: Payer: Self-pay

## 2023-10-24 MED ORDER — GABAPENTIN 100 MG PO CAPS
100.0000 mg | ORAL_CAPSULE | Freq: Every day | ORAL | 2 refills | Status: DC
  Filled 2023-10-24: qty 30, 30d supply, fill #0
  Filled 2023-11-19: qty 30, 30d supply, fill #1

## 2023-11-19 ENCOUNTER — Other Ambulatory Visit: Payer: Self-pay

## 2023-11-19 ENCOUNTER — Other Ambulatory Visit (HOSPITAL_COMMUNITY): Payer: Self-pay

## 2023-11-28 NOTE — Progress Notes (Signed)
 The patient attended a screening event on 09/01/2023 where her BP screening results was 135/73, blood glucose 65 not fasting. At the event the patient noted she has private insurance and does not smoke. Patient did not complete the SDOH screening form at the event. Pt listed pcp as Dr. Glendia Bears. Per chart review pt has a pcp and the last office visit was 06/14/2023 for essential hypertension. The pt BP was 115/79  on 06/14/2023. According to chart review pt is currently on amlodipine  and lisinopril  to manage blood pressure. Chart review also indicates a future appt on 12/12/2023 with pcp. Letter sent with blood pressure resources in case needed by pt. No additional Health equity team support indicated at this time.

## 2023-12-12 ENCOUNTER — Ambulatory Visit: Payer: Commercial Managed Care - PPO | Admitting: Family Medicine

## 2023-12-12 ENCOUNTER — Other Ambulatory Visit (HOSPITAL_COMMUNITY): Payer: Self-pay

## 2023-12-12 ENCOUNTER — Encounter: Payer: Self-pay | Admitting: Family Medicine

## 2023-12-12 VITALS — BP 130/78 | HR 75 | Temp 97.2°F | Ht 62.0 in | Wt 148.0 lb

## 2023-12-12 DIAGNOSIS — D509 Iron deficiency anemia, unspecified: Secondary | ICD-10-CM

## 2023-12-12 DIAGNOSIS — I10 Essential (primary) hypertension: Secondary | ICD-10-CM | POA: Diagnosis not present

## 2023-12-12 DIAGNOSIS — E785 Hyperlipidemia, unspecified: Secondary | ICD-10-CM

## 2023-12-12 DIAGNOSIS — Z7984 Long term (current) use of oral hypoglycemic drugs: Secondary | ICD-10-CM | POA: Diagnosis not present

## 2023-12-12 DIAGNOSIS — E1169 Type 2 diabetes mellitus with other specified complication: Secondary | ICD-10-CM | POA: Diagnosis not present

## 2023-12-12 DIAGNOSIS — R6889 Other general symptoms and signs: Secondary | ICD-10-CM | POA: Diagnosis not present

## 2023-12-12 DIAGNOSIS — Z79899 Other long term (current) drug therapy: Secondary | ICD-10-CM

## 2023-12-12 DIAGNOSIS — E119 Type 2 diabetes mellitus without complications: Secondary | ICD-10-CM

## 2023-12-12 DIAGNOSIS — M7918 Myalgia, other site: Secondary | ICD-10-CM | POA: Diagnosis not present

## 2023-12-12 DIAGNOSIS — M47816 Spondylosis without myelopathy or radiculopathy, lumbar region: Secondary | ICD-10-CM

## 2023-12-12 DIAGNOSIS — G542 Cervical root disorders, not elsewhere classified: Secondary | ICD-10-CM

## 2023-12-12 DIAGNOSIS — M546 Pain in thoracic spine: Secondary | ICD-10-CM | POA: Diagnosis not present

## 2023-12-12 MED ORDER — TIRZEPATIDE 15 MG/0.5ML ~~LOC~~ SOAJ
15.0000 mg | SUBCUTANEOUS | 5 refills | Status: DC
  Filled 2023-12-12 – 2024-01-21 (×3): qty 2, 28d supply, fill #0
  Filled 2024-02-15: qty 2, 28d supply, fill #1
  Filled 2024-03-17: qty 2, 28d supply, fill #2
  Filled 2024-04-22: qty 2, 28d supply, fill #3
  Filled 2024-05-21 – 2024-05-26 (×2): qty 2, 28d supply, fill #0

## 2023-12-12 MED ORDER — METFORMIN HCL 500 MG PO TABS
500.0000 mg | ORAL_TABLET | Freq: Every morning | ORAL | 1 refills | Status: DC
  Filled 2023-12-12 – 2024-05-21 (×3): qty 90, 90d supply, fill #0

## 2023-12-12 MED ORDER — ROSUVASTATIN CALCIUM 40 MG PO TABS
40.0000 mg | ORAL_TABLET | Freq: Every day | ORAL | 1 refills | Status: DC
  Filled 2023-12-12 – 2024-05-21 (×3): qty 90, 90d supply, fill #0

## 2023-12-12 MED ORDER — LISINOPRIL 10 MG PO TABS
10.0000 mg | ORAL_TABLET | Freq: Every day | ORAL | 1 refills | Status: DC
  Filled 2023-12-12: qty 90, 90d supply, fill #0

## 2023-12-12 MED ORDER — MOMETASONE FUROATE 0.1 % EX CREA
TOPICAL_CREAM | Freq: Two times a day (BID) | CUTANEOUS | 1 refills | Status: AC | PRN
  Filled 2023-12-12: qty 15, 7d supply, fill #0

## 2023-12-12 MED ORDER — AMLODIPINE BESYLATE 5 MG PO TABS
5.0000 mg | ORAL_TABLET | Freq: Every day | ORAL | 1 refills | Status: DC
  Filled 2023-12-12 – 2024-04-22 (×2): qty 90, 90d supply, fill #0

## 2023-12-12 MED ORDER — ALPRAZOLAM 0.5 MG PO TABS
0.5000 mg | ORAL_TABLET | Freq: Every evening | ORAL | 2 refills | Status: DC
  Filled 2023-12-12 – 2024-01-21 (×3): qty 20, 20d supply, fill #0
  Filled 2024-02-15: qty 20, 20d supply, fill #1

## 2023-12-12 MED ORDER — GABAPENTIN 100 MG PO CAPS
100.0000 mg | ORAL_CAPSULE | Freq: Three times a day (TID) | ORAL | 4 refills | Status: DC
  Filled 2023-12-12 – 2024-01-21 (×4): qty 90, 30d supply, fill #0
  Filled 2024-02-15: qty 90, 30d supply, fill #1
  Filled 2024-04-22: qty 90, 30d supply, fill #2

## 2023-12-12 MED ORDER — EZETIMIBE 10 MG PO TABS
10.0000 mg | ORAL_TABLET | Freq: Every day | ORAL | 3 refills | Status: AC
  Filled 2023-12-12 – 2024-04-22 (×2): qty 90, 90d supply, fill #0

## 2023-12-12 NOTE — Progress Notes (Signed)
 Subjective:    Patient ID: Amy Dean, female    DOB: 1966/04/13, 58 y.o.   MRN: [id number]  HPI  HTN, DM follow up  Feels cold at all times and pain located in cervical left side of the neck and shoulder as well as mid back bra line area for about 2 months   xanax - weaning off and mometasone  refills for dark spots   Discussed the use of AI scribe software for clinical note transcription with the patient, who gave verbal consent to proceed.  History of Present Illness   Amy Dean is a 58 year old female with diabetes who presents for management of her diabetes and back pain.  Her diet includes smoothies or boiled eggs for breakfast and baked chicken with vegetables like spinach and radishes for lunch. Her weight is currently 148 pounds, down from 155 pounds previously.  She has been experiencing persistent back pain for the past two months. The pain is described as a burning sensation starting in the middle of her back, extending across the left side of her neck and upper back, down to about her bra line. The pain worsens with sitting up, prolonged work, or lifting. Tingling is present, but there is no numbness or radiation down her arm. She uses gabapentin  as needed, which was initially prescribed for hip pain, and found it helpful for that condition.  She feels cold all the time and inquires about the potential benefit of taking an iron pill. She uses alprazolam  occasionally, about twice a week, primarily when she cannot rest.  Her mood is generally good, with reasonable stress levels. She has not taken a vacation recently, except for two weeks off when moving back into her house, which went well.      Review of Systems     Objective:   Physical Exam  General-in no acute distress Eyes-no discharge Lungs-respiratory rate normal, CTA CV-no murmurs,RRR Extremities skin warm dry no edema Neuro grossly normal Behavior normal, alert       Assessment &  Plan:   1. Hyperlipidemia associated with type 2 diabetes mellitus (HCC) (Primary) Continue current medication healthy diet check labs within the next 4 weeks - Lipid Panel  2. Controlled type 2 diabetes mellitus without complication, without long-term current use of insulin  (HCC) Portion control regular physical activity continue metformin  500 mg a day continue Mounjaro  15 mg Patient is to alert us  if her weight drops into the 130s because we would want to back down on the Mounjaro  The purpose of the Mounjaro  is to keep her A1c under good control but we also want to prevent weight loss beyond 139 - Hemoglobin A1c - Basic Metabolic Panel  3. Essential hypertension Portion control regular physical activity minimize salt fit and walking continue meds  4. Musculoskeletal pain Patient having significant musculoskeletal pain in the left trapezius as well as left rhomboid area does not radiate down the leg to some degree in the shoulder describes some numbness and tingling as well as pain discomfort and aching denies any injury this been going on for at least 4 weeks worse over the past couple weeks no known injury we will do x-ray Tylenol  for now patient is open to trying gabapentin  we will send in the prescription patient was talked to in detail about how to take gabapentin  side effects watch for we will start off slowly 100 mg 1 each evening for the first 4 days then 1 twice daily then she will give  us  an update within 2 weeks the prescription will state 3 times per day but she knows to follow this direction obviously do not drive if feeling drowsy if having significant side effects of medicine stop medicine - DG Thoracic Spine 2 View  5. Cervical nerve root impingement Recommend x-rays Recommend gabapentin  as discussed above Patient to give us  feedback within 4 to 6 weeks if not dramatically better consider MRI no weakness detected on physical exam - DG Cervical Spine 2 or 3 views  6.  Bilateral thoracic back pain, unspecified chronicity Please see discussion above  7. Cold intolerance Thyroid  testing Could be related to her weight loss from medications - CBC with Differential - Iron, TIBC and Ferritin Panel - TSH + free T4  8. Iron deficiency anemia, unspecified iron deficiency anemia type Will check iron levels - CBC with Differential - Iron, TIBC and Ferritin Panel  9. High risk medication use Labs ordered - Hepatic Function Panel

## 2023-12-13 ENCOUNTER — Other Ambulatory Visit: Payer: Self-pay

## 2023-12-13 ENCOUNTER — Other Ambulatory Visit (HOSPITAL_COMMUNITY): Payer: Self-pay

## 2023-12-17 ENCOUNTER — Other Ambulatory Visit: Payer: Self-pay

## 2023-12-17 ENCOUNTER — Other Ambulatory Visit (HOSPITAL_COMMUNITY): Payer: Self-pay

## 2023-12-17 DIAGNOSIS — Z79899 Other long term (current) drug therapy: Secondary | ICD-10-CM | POA: Diagnosis not present

## 2023-12-17 DIAGNOSIS — E785 Hyperlipidemia, unspecified: Secondary | ICD-10-CM | POA: Diagnosis not present

## 2023-12-17 DIAGNOSIS — D509 Iron deficiency anemia, unspecified: Secondary | ICD-10-CM | POA: Diagnosis not present

## 2023-12-17 DIAGNOSIS — E119 Type 2 diabetes mellitus without complications: Secondary | ICD-10-CM | POA: Diagnosis not present

## 2023-12-17 DIAGNOSIS — E1169 Type 2 diabetes mellitus with other specified complication: Secondary | ICD-10-CM | POA: Diagnosis not present

## 2023-12-17 DIAGNOSIS — R6889 Other general symptoms and signs: Secondary | ICD-10-CM | POA: Diagnosis not present

## 2023-12-18 ENCOUNTER — Ambulatory Visit: Payer: Self-pay | Admitting: Family Medicine

## 2023-12-18 LAB — HEPATIC FUNCTION PANEL
ALT: 57 IU/L — ABNORMAL HIGH (ref 0–32)
AST: 83 IU/L — ABNORMAL HIGH (ref 0–40)
Albumin: 4.7 g/dL (ref 3.8–4.9)
Alkaline Phosphatase: 78 IU/L (ref 44–121)
Bilirubin Total: 0.4 mg/dL (ref 0.0–1.2)
Bilirubin, Direct: 0.14 mg/dL (ref 0.00–0.40)
Total Protein: 7 g/dL (ref 6.0–8.5)

## 2023-12-18 LAB — CBC WITH DIFFERENTIAL/PLATELET
Basophils Absolute: 0 x10E3/uL (ref 0.0–0.2)
Basos: 1 %
EOS (ABSOLUTE): 0.1 x10E3/uL (ref 0.0–0.4)
Eos: 2 %
Hematocrit: 39 % (ref 34.0–46.6)
Hemoglobin: 12.7 g/dL (ref 11.1–15.9)
Immature Grans (Abs): 0 x10E3/uL (ref 0.0–0.1)
Immature Granulocytes: 0 %
Lymphocytes Absolute: 1.2 x10E3/uL (ref 0.7–3.1)
Lymphs: 37 %
MCH: 31.1 pg (ref 26.6–33.0)
MCHC: 32.6 g/dL (ref 31.5–35.7)
MCV: 96 fL (ref 79–97)
Monocytes Absolute: 0.2 x10E3/uL (ref 0.1–0.9)
Monocytes: 7 %
Neutrophils Absolute: 1.7 x10E3/uL (ref 1.4–7.0)
Neutrophils: 53 %
Platelets: 175 x10E3/uL (ref 150–450)
RBC: 4.08 x10E6/uL (ref 3.77–5.28)
RDW: 13.2 % (ref 11.7–15.4)
WBC: 3.2 x10E3/uL — ABNORMAL LOW (ref 3.4–10.8)

## 2023-12-18 LAB — IRON,TIBC AND FERRITIN PANEL
Ferritin: 72 ng/mL (ref 15–150)
Iron Saturation: 40 % (ref 15–55)
Iron: 123 ug/dL (ref 27–159)
Total Iron Binding Capacity: 308 ug/dL (ref 250–450)
UIBC: 185 ug/dL (ref 131–425)

## 2023-12-18 LAB — TSH+FREE T4
Free T4: 1.12 ng/dL (ref 0.82–1.77)
TSH: 0.672 u[IU]/mL (ref 0.450–4.500)

## 2023-12-18 LAB — BASIC METABOLIC PANEL WITH GFR
BUN/Creatinine Ratio: 18 (ref 9–23)
BUN: 10 mg/dL (ref 6–24)
CO2: 21 mmol/L (ref 20–29)
Calcium: 9.4 mg/dL (ref 8.7–10.2)
Chloride: 108 mmol/L — ABNORMAL HIGH (ref 96–106)
Creatinine, Ser: 0.56 mg/dL — ABNORMAL LOW (ref 0.57–1.00)
Glucose: 66 mg/dL — ABNORMAL LOW (ref 70–99)
Potassium: 4.1 mmol/L (ref 3.5–5.2)
Sodium: 145 mmol/L — ABNORMAL HIGH (ref 134–144)
eGFR: 105 mL/min/1.73 (ref >59)

## 2023-12-18 LAB — HEMOGLOBIN A1C
Est. average glucose Bld gHb Est-mCnc: 94 mg/dL
Hgb A1c MFr Bld: 4.9 % (ref 4.8–5.6)

## 2023-12-18 LAB — LIPID PANEL
Chol/HDL Ratio: 2.2 ratio (ref 0.0–4.4)
Cholesterol, Total: 135 mg/dL (ref 100–199)
HDL: 62 mg/dL (ref >39)
LDL Chol Calc (NIH): 59 mg/dL (ref 0–99)
Triglycerides: 70 mg/dL (ref 0–149)
VLDL Cholesterol Cal: 14 mg/dL (ref 5–40)

## 2023-12-21 ENCOUNTER — Other Ambulatory Visit: Payer: Self-pay

## 2023-12-21 DIAGNOSIS — R748 Abnormal levels of other serum enzymes: Secondary | ICD-10-CM

## 2023-12-21 DIAGNOSIS — D72819 Decreased white blood cell count, unspecified: Secondary | ICD-10-CM

## 2023-12-21 NOTE — Progress Notes (Signed)
 Pt notified. She stopped taking Crestor  yesterday and will have labs redrawn in 30 days time. Also no recent illness or infection.

## 2024-01-14 ENCOUNTER — Other Ambulatory Visit: Payer: Self-pay

## 2024-01-14 ENCOUNTER — Other Ambulatory Visit (HOSPITAL_COMMUNITY): Payer: Self-pay

## 2024-01-14 ENCOUNTER — Other Ambulatory Visit: Payer: Self-pay | Admitting: Family Medicine

## 2024-01-14 DIAGNOSIS — I10 Essential (primary) hypertension: Secondary | ICD-10-CM

## 2024-01-14 MED ORDER — LISINOPRIL 20 MG PO TABS
20.0000 mg | ORAL_TABLET | Freq: Every day | ORAL | 1 refills | Status: DC
  Filled 2024-01-14 – 2024-04-22 (×2): qty 90, 90d supply, fill #0

## 2024-01-14 NOTE — Telephone Encounter (Signed)
 Nurses 1.  I increased lisinopril  from 10 mg to a new dose of 20 mg take daily Until she gets her new prescription she can take 2 of the 10 mg 2.  I would also recommend a metabolic assessment in approximately 7 to 10 days after starting the 20 mg to make sure that she is getting along with this please order and have the patient do the lab work 3.  I recommend a follow-up office visit either with myself Charmaine or Elveria in approximately 3 weeks to recheck blood pressure 4.  She should continue all her other medicines #5 update us  if any problems Thanks-Dr. Glendia

## 2024-01-21 ENCOUNTER — Other Ambulatory Visit (HOSPITAL_BASED_OUTPATIENT_CLINIC_OR_DEPARTMENT_OTHER): Payer: Self-pay

## 2024-01-21 ENCOUNTER — Other Ambulatory Visit (HOSPITAL_COMMUNITY): Payer: Self-pay

## 2024-01-22 ENCOUNTER — Other Ambulatory Visit (HOSPITAL_BASED_OUTPATIENT_CLINIC_OR_DEPARTMENT_OTHER): Payer: Self-pay

## 2024-01-29 DIAGNOSIS — I10 Essential (primary) hypertension: Secondary | ICD-10-CM | POA: Diagnosis not present

## 2024-01-30 ENCOUNTER — Ambulatory Visit: Payer: Self-pay | Admitting: Family Medicine

## 2024-01-30 LAB — BASIC METABOLIC PANEL WITH GFR
BUN/Creatinine Ratio: 26 — ABNORMAL HIGH (ref 9–23)
BUN: 15 mg/dL (ref 6–24)
CO2: 21 mmol/L (ref 20–29)
Calcium: 10.2 mg/dL (ref 8.7–10.2)
Chloride: 105 mmol/L (ref 96–106)
Creatinine, Ser: 0.58 mg/dL (ref 0.57–1.00)
Glucose: 75 mg/dL (ref 70–99)
Potassium: 4.3 mmol/L (ref 3.5–5.2)
Sodium: 143 mmol/L (ref 134–144)
eGFR: 104 mL/min/1.73 (ref >59)

## 2024-01-31 ENCOUNTER — Encounter: Payer: Self-pay | Admitting: Family Medicine

## 2024-02-15 ENCOUNTER — Other Ambulatory Visit (HOSPITAL_BASED_OUTPATIENT_CLINIC_OR_DEPARTMENT_OTHER): Payer: Self-pay

## 2024-03-04 ENCOUNTER — Other Ambulatory Visit (HOSPITAL_COMMUNITY): Payer: Self-pay | Admitting: Family Medicine

## 2024-03-04 DIAGNOSIS — Z1231 Encounter for screening mammogram for malignant neoplasm of breast: Secondary | ICD-10-CM

## 2024-03-17 ENCOUNTER — Other Ambulatory Visit: Payer: Self-pay | Admitting: Family Medicine

## 2024-03-17 ENCOUNTER — Other Ambulatory Visit (HOSPITAL_BASED_OUTPATIENT_CLINIC_OR_DEPARTMENT_OTHER): Payer: Self-pay

## 2024-03-17 MED ORDER — ALPRAZOLAM 0.5 MG PO TABS
0.5000 mg | ORAL_TABLET | Freq: Every evening | ORAL | 2 refills | Status: DC
  Filled 2024-03-17: qty 20, 20d supply, fill #0
  Filled 2024-04-22: qty 20, 20d supply, fill #1
  Filled 2024-05-26: qty 20, 20d supply, fill #2
  Filled 2024-06-03: qty 20, 20d supply, fill #0

## 2024-03-24 ENCOUNTER — Ambulatory Visit: Payer: Self-pay | Admitting: Family Medicine

## 2024-03-24 ENCOUNTER — Encounter: Payer: Self-pay | Admitting: Family Medicine

## 2024-03-24 ENCOUNTER — Other Ambulatory Visit: Payer: Self-pay

## 2024-03-24 ENCOUNTER — Other Ambulatory Visit: Payer: Self-pay | Admitting: Family Medicine

## 2024-03-24 VITALS — BP 177/80 | HR 85 | Temp 97.8°F | Ht 62.0 in | Wt 144.0 lb

## 2024-03-24 DIAGNOSIS — E1169 Type 2 diabetes mellitus with other specified complication: Secondary | ICD-10-CM

## 2024-03-24 DIAGNOSIS — I10 Essential (primary) hypertension: Secondary | ICD-10-CM

## 2024-03-24 DIAGNOSIS — Z7984 Long term (current) use of oral hypoglycemic drugs: Secondary | ICD-10-CM

## 2024-03-24 DIAGNOSIS — R748 Abnormal levels of other serum enzymes: Secondary | ICD-10-CM | POA: Diagnosis not present

## 2024-03-24 DIAGNOSIS — E119 Type 2 diabetes mellitus without complications: Secondary | ICD-10-CM

## 2024-03-24 DIAGNOSIS — M546 Pain in thoracic spine: Secondary | ICD-10-CM | POA: Diagnosis not present

## 2024-03-24 DIAGNOSIS — D72819 Decreased white blood cell count, unspecified: Secondary | ICD-10-CM | POA: Diagnosis not present

## 2024-03-24 DIAGNOSIS — E785 Hyperlipidemia, unspecified: Secondary | ICD-10-CM | POA: Diagnosis not present

## 2024-03-24 MED ORDER — TRIAMCINOLONE ACETONIDE 0.1 % EX CREA
TOPICAL_CREAM | CUTANEOUS | 1 refills | Status: AC
  Filled 2024-03-24 – 2024-04-22 (×2): qty 45, 30d supply, fill #0

## 2024-03-24 NOTE — Progress Notes (Signed)
   Subjective:    Patient ID: Amy Dean, female    DOB: February 19, 1966, 58 y.o.   MRN: [id number] Pt here for BP f/u.   Pt also concerned about back pain and dark spot on back. HPI Discussed the use of AI scribe software for clinical note transcription with the patient, who gave verbal consent to proceed.  History of Present Illness   Amy Dean is a 58 year old female who presents with mid thoracic back pain.  She has been experiencing mid thoracic back pain for approximately two months. The pain is described as an ache that is present daily and sometimes wakes her up at night. It is located in the middle to lower middle back, around the area of her bra strap, and radiates to the left side. The pain persists even when not wearing a bra.  She manages the pain with Tylenol , which provides temporary relief, and occasionally takes gabapentin , usually one in the evening. She is not currently taking meloxicam , as it has not been filled in over eight months.  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  No numbness or tingling down her legs, but her right hip has started to bother her, having previously been an issue on the left side.        Review of Systems     Objective:   Physical Exam       General-in no acute distress Eyes-no discharge Lungs-respiratory rate normal, CTA CV-no murmurs,RRR Extremities skin warm dry no edema Neuro grossly normal Behavior normal, alert Midthoracic back pain Has some mild darkening area of the skin consistent with dermatitis around that region No sign of redness or infection  Assessment & Plan:   Thoracic spine pain with suspected degenerative changes Chronic mid thoracic back pain with suspected degenerative changes, possible nerve impingement. Differential includes degenerative disc disease or spinal  stenosis. - Ordered thoracic spine x-ray. - Consider MRI based on x-ray results. - Discussed potential for injections or surgery if MRI indicates significant findings.  Localized dermatitis of thoracic back Dark spot with itching, likely dermatitis. No malignancy signs. - Prescribed mild steroid cream for itching.  Hypertension Blood pressure elevated at 155/80 mmHg. Non-adherence to medication regimen noted. - Encouraged regular blood pressure monitoring at home. - Advised to report blood pressure readings in 2-3 weeks. - Reinforced importance of medication adherence.  Type 2 diabetes mellitus Managed with metformin  and tirzepatide . Reports good adherence and appetite control. - Continue current diabetes management regimen.  Hyperlipidemia Previously on rosuvastatin , currently off medication pending blood work review. - Ordered blood work to reassess lipid levels. - Consider resuming rosuvastatin  based on blood work results.

## 2024-03-25 ENCOUNTER — Ambulatory Visit: Payer: Self-pay | Admitting: Family Medicine

## 2024-03-25 ENCOUNTER — Ambulatory Visit (HOSPITAL_COMMUNITY)
Admission: RE | Admit: 2024-03-25 | Discharge: 2024-03-25 | Disposition: A | Discharge status: Home / Self Care | Source: Ambulatory Visit | Attending: Family Medicine | Admitting: Family Medicine

## 2024-03-25 ENCOUNTER — Other Ambulatory Visit: Payer: Self-pay

## 2024-03-25 DIAGNOSIS — M546 Pain in thoracic spine: Secondary | ICD-10-CM | POA: Insufficient documentation

## 2024-03-25 DIAGNOSIS — M47815 Spondylosis without myelopathy or radiculopathy, thoracolumbar region: Secondary | ICD-10-CM | POA: Diagnosis not present

## 2024-03-25 DIAGNOSIS — M549 Dorsalgia, unspecified: Secondary | ICD-10-CM

## 2024-03-25 LAB — HEPATIC FUNCTION PANEL
ALT: 13 IU/L (ref 0–32)
AST: 18 IU/L (ref 0–40)
Albumin: 4.6 g/dL (ref 3.8–4.9)
Alkaline Phosphatase: 66 IU/L (ref 49–135)
Bilirubin Total: 0.6 mg/dL (ref 0.0–1.2)
Bilirubin, Direct: 0.21 mg/dL (ref 0.00–0.40)
Total Protein: 7 g/dL (ref 6.0–8.5)

## 2024-03-25 LAB — CBC WITH DIFFERENTIAL/PLATELET
Basophils Absolute: 0.1 x10E3/uL (ref 0.0–0.2)
Basos: 1 %
EOS (ABSOLUTE): 0.1 x10E3/uL (ref 0.0–0.4)
Eos: 1 %
Hematocrit: 37.8 % (ref 34.0–46.6)
Hemoglobin: 12.5 g/dL (ref 11.1–15.9)
Immature Grans (Abs): 0 x10E3/uL (ref 0.0–0.1)
Immature Granulocytes: 0 %
Lymphocytes Absolute: 1.6 x10E3/uL (ref 0.7–3.1)
Lymphs: 34 %
MCH: 31.3 pg (ref 26.6–33.0)
MCHC: 33.1 g/dL (ref 31.5–35.7)
MCV: 95 fL (ref 79–97)
Monocytes Absolute: 0.4 x10E3/uL (ref 0.1–0.9)
Monocytes: 8 %
Neutrophils Absolute: 2.5 x10E3/uL (ref 1.4–7.0)
Neutrophils: 56 %
Platelets: 203 x10E3/uL (ref 150–450)
RBC: 4 x10E6/uL (ref 3.77–5.28)
RDW: 13.2 % (ref 11.7–15.4)
WBC: 4.6 x10E3/uL (ref 3.4–10.8)

## 2024-03-25 LAB — C-REACTIVE PROTEIN: CRP: 2 mg/L (ref 0–10)

## 2024-03-25 LAB — SEDIMENTATION RATE: Sed Rate: 2 mm/h (ref 0–40)

## 2024-04-04 ENCOUNTER — Ambulatory Visit (HOSPITAL_COMMUNITY)
Admission: RE | Admit: 2024-04-04 | Discharge: 2024-04-04 | Disposition: A | Discharge status: Home / Self Care | Source: Ambulatory Visit | Attending: Family Medicine | Admitting: Family Medicine

## 2024-04-04 DIAGNOSIS — M549 Dorsalgia, unspecified: Secondary | ICD-10-CM | POA: Insufficient documentation

## 2024-04-04 DIAGNOSIS — M546 Pain in thoracic spine: Secondary | ICD-10-CM

## 2024-04-10 ENCOUNTER — Ambulatory Visit: Payer: Self-pay | Admitting: Family Medicine

## 2024-04-22 ENCOUNTER — Other Ambulatory Visit (HOSPITAL_BASED_OUTPATIENT_CLINIC_OR_DEPARTMENT_OTHER): Payer: Self-pay

## 2024-05-05 ENCOUNTER — Ambulatory Visit (HOSPITAL_COMMUNITY)
Admission: RE | Admit: 2024-05-05 | Discharge: 2024-05-05 | Disposition: A | Discharge status: Home / Self Care | Source: Ambulatory Visit | Attending: Family Medicine | Admitting: Family Medicine

## 2024-05-05 DIAGNOSIS — Z1231 Encounter for screening mammogram for malignant neoplasm of breast: Secondary | ICD-10-CM | POA: Insufficient documentation

## 2024-05-15 ENCOUNTER — Other Ambulatory Visit: Payer: Self-pay

## 2024-05-21 ENCOUNTER — Other Ambulatory Visit (HOSPITAL_COMMUNITY): Payer: Self-pay

## 2024-05-21 ENCOUNTER — Other Ambulatory Visit: Payer: Self-pay

## 2024-05-21 ENCOUNTER — Other Ambulatory Visit (HOSPITAL_BASED_OUTPATIENT_CLINIC_OR_DEPARTMENT_OTHER): Payer: Self-pay

## 2024-05-21 ENCOUNTER — Encounter: Payer: Self-pay | Admitting: Pharmacist

## 2024-05-26 ENCOUNTER — Other Ambulatory Visit: Payer: Self-pay

## 2024-05-27 ENCOUNTER — Other Ambulatory Visit: Payer: Self-pay

## 2024-05-27 ENCOUNTER — Other Ambulatory Visit (HOSPITAL_COMMUNITY): Payer: Self-pay

## 2024-05-27 ENCOUNTER — Encounter: Payer: Self-pay | Admitting: Pharmacist

## 2024-05-30 ENCOUNTER — Other Ambulatory Visit: Payer: Self-pay

## 2024-06-03 ENCOUNTER — Other Ambulatory Visit: Payer: Self-pay

## 2024-06-03 ENCOUNTER — Other Ambulatory Visit (HOSPITAL_BASED_OUTPATIENT_CLINIC_OR_DEPARTMENT_OTHER): Payer: Self-pay

## 2024-06-03 ENCOUNTER — Other Ambulatory Visit (HOSPITAL_COMMUNITY): Payer: Self-pay

## 2024-06-04 ENCOUNTER — Other Ambulatory Visit: Payer: Self-pay

## 2024-06-05 ENCOUNTER — Other Ambulatory Visit: Payer: Self-pay

## 2024-06-06 ENCOUNTER — Other Ambulatory Visit: Payer: Self-pay

## 2024-06-13 ENCOUNTER — Ambulatory Visit: Admitting: Family Medicine

## 2024-06-13 ENCOUNTER — Other Ambulatory Visit (HOSPITAL_BASED_OUTPATIENT_CLINIC_OR_DEPARTMENT_OTHER): Payer: Self-pay

## 2024-06-13 VITALS — BP 118/69 | HR 98 | Temp 97.7°F | Ht 62.0 in | Wt 144.4 lb

## 2024-06-13 DIAGNOSIS — M47816 Spondylosis without myelopathy or radiculopathy, lumbar region: Secondary | ICD-10-CM

## 2024-06-13 DIAGNOSIS — I10 Essential (primary) hypertension: Secondary | ICD-10-CM

## 2024-06-13 DIAGNOSIS — M549 Dorsalgia, unspecified: Secondary | ICD-10-CM

## 2024-06-13 DIAGNOSIS — E1169 Type 2 diabetes mellitus with other specified complication: Secondary | ICD-10-CM

## 2024-06-13 DIAGNOSIS — E119 Type 2 diabetes mellitus without complications: Secondary | ICD-10-CM

## 2024-06-13 MED ORDER — GABAPENTIN 100 MG PO CAPS
200.0000 mg | ORAL_CAPSULE | Freq: Three times a day (TID) | ORAL | 4 refills | Status: AC
  Filled 2024-06-13: qty 180, 30d supply, fill #0

## 2024-06-13 MED ORDER — TIRZEPATIDE 15 MG/0.5ML ~~LOC~~ SOAJ
15.0000 mg | SUBCUTANEOUS | 5 refills | Status: AC
  Filled 2024-06-13: qty 2, 28d supply, fill #0

## 2024-06-13 MED ORDER — AMLODIPINE BESYLATE 5 MG PO TABS
5.0000 mg | ORAL_TABLET | Freq: Every day | ORAL | 1 refills | Status: AC
  Filled 2024-06-13: qty 90, 90d supply, fill #0

## 2024-06-13 MED ORDER — PANTOPRAZOLE SODIUM 40 MG PO TBEC
40.0000 mg | DELAYED_RELEASE_TABLET | Freq: Every day | ORAL | 3 refills | Status: AC
  Filled 2024-06-13: qty 90, 90d supply, fill #0

## 2024-06-13 MED ORDER — ROSUVASTATIN CALCIUM 10 MG PO TABS
10.0000 mg | ORAL_TABLET | Freq: Every day | ORAL | 1 refills | Status: AC
  Filled 2024-06-13: qty 90, 90d supply, fill #0

## 2024-06-13 MED ORDER — ALPRAZOLAM 0.5 MG PO TABS
0.5000 mg | ORAL_TABLET | Freq: Every evening | ORAL | 2 refills | Status: AC
  Filled 2024-06-13: qty 20, 20d supply, fill #0

## 2024-06-13 MED ORDER — METFORMIN HCL 500 MG PO TABS
500.0000 mg | ORAL_TABLET | Freq: Every morning | ORAL | 1 refills | Status: AC
  Filled 2024-06-13: qty 90, 90d supply, fill #0

## 2024-06-13 MED ORDER — LISINOPRIL 20 MG PO TABS
20.0000 mg | ORAL_TABLET | Freq: Every day | ORAL | 1 refills | Status: AC
  Filled 2024-06-13: qty 90, 90d supply, fill #0

## 2024-06-13 NOTE — Progress Notes (Unsigned)
" ° °  Subjective:    Patient ID: Amy Dean, female    DOB: 08-13-1965, 59 y.o.   MRN: [id number]  HPI Patient is here for a 6 month follow up  Patient stated she is having a burning sensation on the side of her left check area. Noticed for a week now   Patient stated at her last visit she was having mid-back pain that runs to the back of her neck she would like to discuss  Is a regular  Review of Systems     Objective:   Physical Exam General-in no acute distress Eyes-no discharge Lungs-respiratory rate normal, CTA CV-no murmurs,RRR Extremities skin warm dry no edema Neuro grossly normal Behavior normal, alert        Assessment & Plan:  1. Facet arthritis of lumbar region-L5 She will continue the gabapentin  she states it seems to help she can increase this to 2 capsules 3 times a day because she is having some neuralgia on the left side of her face - gabapentin  (NEURONTIN ) 100 MG capsule; Take 2 capsules (200 mg total) by mouth 3 (three) times daily.  Dispense: 180 capsule; Refill: 4  2. Mid back pain (Primary) I offered MRI and orthopedic consult patient defers currently if things or not doing better in the next 4 to 6 weeks she will let us  know  3. Essential hypertension Blood pressure decent control continue current measures  4. Controlled type 2 diabetes mellitus without complication, without long-term current use of insulin  (HCC) Previous labs look good more labs before her next visit  5. Hyperlipidemia associated with type 2 diabetes mellitus (HCC) Continue current measures  Neuralgia left side of face no sign of any vesicles no weakness if any ongoing troubles or progressive issues notify us  immediately for recheck  Follow-up 6 months "

## 2024-12-11 ENCOUNTER — Ambulatory Visit: Admitting: Family Medicine
